# Patient Record
Sex: Female | Born: 1935 | Race: White | Hispanic: No | Marital: Single | Smoking: Never smoker
Health system: Southern US, Community
[De-identification: ages and names within clinical notes are randomized; demographics above are authoritative.]

## PROBLEM LIST (undated history)

## (undated) DIAGNOSIS — I10 Essential (primary) hypertension: Secondary | ICD-10-CM

## (undated) DIAGNOSIS — F419 Anxiety disorder, unspecified: Secondary | ICD-10-CM

## (undated) DIAGNOSIS — E785 Hyperlipidemia, unspecified: Secondary | ICD-10-CM

## (undated) DIAGNOSIS — N189 Chronic kidney disease, unspecified: Secondary | ICD-10-CM

## (undated) DIAGNOSIS — I499 Cardiac arrhythmia, unspecified: Secondary | ICD-10-CM

## (undated) DIAGNOSIS — H269 Unspecified cataract: Secondary | ICD-10-CM

## (undated) DIAGNOSIS — K2970 Gastritis, unspecified, without bleeding: Secondary | ICD-10-CM

## (undated) DIAGNOSIS — T7840XA Allergy, unspecified, initial encounter: Secondary | ICD-10-CM

## (undated) DIAGNOSIS — I471 Supraventricular tachycardia, unspecified: Secondary | ICD-10-CM

## (undated) DIAGNOSIS — M1990 Unspecified osteoarthritis, unspecified site: Secondary | ICD-10-CM

## (undated) DIAGNOSIS — K219 Gastro-esophageal reflux disease without esophagitis: Secondary | ICD-10-CM

## (undated) DIAGNOSIS — K635 Polyp of colon: Secondary | ICD-10-CM

## (undated) DIAGNOSIS — K449 Diaphragmatic hernia without obstruction or gangrene: Secondary | ICD-10-CM

## (undated) DIAGNOSIS — K589 Irritable bowel syndrome without diarrhea: Secondary | ICD-10-CM

## (undated) HISTORY — DX: Hyperlipidemia, unspecified: E78.5

## (undated) HISTORY — PX: EYE SURGERY: SHX253

## (undated) HISTORY — DX: Supraventricular tachycardia, unspecified: I47.10

## (undated) HISTORY — PX: GANGLION CYST EXCISION: SHX1691

## (undated) HISTORY — DX: Unspecified osteoarthritis, unspecified site: M19.90

## (undated) HISTORY — PX: KNEE ARTHROSCOPY: SHX127

## (undated) HISTORY — DX: Chronic kidney disease, unspecified: N18.9

## (undated) HISTORY — DX: Gastro-esophageal reflux disease without esophagitis: K21.9

## (undated) HISTORY — PX: CARPAL TUNNEL RELEASE: SHX101

## (undated) HISTORY — PX: SKIN CANCER EXCISION: SHX779

## (undated) HISTORY — DX: Allergy, unspecified, initial encounter: T78.40XA

## (undated) HISTORY — PX: TONSILLECTOMY AND ADENOIDECTOMY: SHX28

## (undated) HISTORY — DX: Anxiety disorder, unspecified: F41.9

## (undated) HISTORY — PX: CATARACT EXTRACTION W/ INTRAOCULAR LENS IMPLANT: SHX1309

## (undated) HISTORY — DX: Unspecified cataract: H26.9

## (undated) HISTORY — DX: Essential (primary) hypertension: I10

## (undated) HISTORY — DX: Polyp of colon: K63.5

## (undated) HISTORY — PX: TONSILLECTOMY: SUR1361

## (undated) HISTORY — PX: SALIVARY GLAND SURGERY: SHX768

## (undated) HISTORY — PX: VEIN LIGATION: SHX2652

## (undated) HISTORY — DX: Cardiac arrhythmia, unspecified: I49.9

## (undated) HISTORY — PX: LITHOTRIPSY: SUR834

---

## 1957-09-28 HISTORY — PX: APPENDECTOMY: SHX54

## 1993-09-28 HISTORY — PX: CHOLECYSTECTOMY: SHX55

## 2005-01-15 ENCOUNTER — Ambulatory Visit: Payer: Self-pay | Admitting: Gastroenterology

## 2005-06-21 ENCOUNTER — Emergency Department: Payer: Self-pay | Admitting: Emergency Medicine

## 2008-02-23 ENCOUNTER — Ambulatory Visit: Payer: Self-pay | Admitting: Unknown Physician Specialty

## 2008-03-19 ENCOUNTER — Ambulatory Visit: Payer: Self-pay | Admitting: Anesthesiology

## 2008-03-28 HISTORY — PX: JOINT REPLACEMENT: SHX530

## 2008-04-02 ENCOUNTER — Ambulatory Visit: Payer: Self-pay | Admitting: General Practice

## 2008-04-02 ENCOUNTER — Other Ambulatory Visit: Payer: Self-pay

## 2008-04-12 ENCOUNTER — Ambulatory Visit: Payer: Self-pay | Admitting: Anesthesiology

## 2008-04-17 ENCOUNTER — Inpatient Hospital Stay: Payer: Self-pay | Admitting: General Practice

## 2008-06-12 ENCOUNTER — Ambulatory Visit: Payer: Self-pay | Admitting: Anesthesiology

## 2008-09-17 ENCOUNTER — Ambulatory Visit: Payer: Self-pay | Admitting: Anesthesiology

## 2008-11-13 ENCOUNTER — Ambulatory Visit: Payer: Self-pay | Admitting: Anesthesiology

## 2009-02-13 ENCOUNTER — Ambulatory Visit: Payer: Self-pay | Admitting: Anesthesiology

## 2009-04-17 ENCOUNTER — Ambulatory Visit: Payer: Self-pay | Admitting: Anesthesiology

## 2009-05-28 ENCOUNTER — Emergency Department: Payer: Self-pay | Admitting: Emergency Medicine

## 2009-05-28 ENCOUNTER — Ambulatory Visit: Payer: Self-pay | Admitting: Anesthesiology

## 2009-06-12 ENCOUNTER — Emergency Department: Payer: Self-pay | Admitting: Emergency Medicine

## 2009-06-13 ENCOUNTER — Ambulatory Visit: Payer: Self-pay | Admitting: General Practice

## 2009-06-14 ENCOUNTER — Ambulatory Visit: Payer: Self-pay | Admitting: Urology

## 2009-06-21 ENCOUNTER — Ambulatory Visit: Payer: Self-pay | Admitting: Urology

## 2009-06-27 ENCOUNTER — Ambulatory Visit: Payer: Self-pay | Admitting: Urology

## 2009-07-12 ENCOUNTER — Ambulatory Visit: Payer: Self-pay | Admitting: Urology

## 2009-07-23 ENCOUNTER — Ambulatory Visit: Payer: Self-pay | Admitting: Anesthesiology

## 2009-07-26 ENCOUNTER — Ambulatory Visit: Payer: Self-pay | Admitting: General Practice

## 2009-08-09 ENCOUNTER — Ambulatory Visit: Payer: Self-pay | Admitting: Urology

## 2009-11-14 ENCOUNTER — Ambulatory Visit: Payer: Self-pay | Admitting: Anesthesiology

## 2009-12-02 ENCOUNTER — Ambulatory Visit: Payer: Self-pay | Admitting: Anesthesiology

## 2010-01-03 ENCOUNTER — Ambulatory Visit: Payer: Self-pay | Admitting: Urology

## 2010-02-18 ENCOUNTER — Ambulatory Visit: Payer: Self-pay | Admitting: Anesthesiology

## 2010-03-18 ENCOUNTER — Ambulatory Visit: Payer: Self-pay | Admitting: Anesthesiology

## 2010-06-04 DIAGNOSIS — C4491 Basal cell carcinoma of skin, unspecified: Secondary | ICD-10-CM

## 2010-06-04 HISTORY — DX: Basal cell carcinoma of skin, unspecified: C44.91

## 2010-12-24 ENCOUNTER — Ambulatory Visit: Payer: Self-pay | Admitting: Anesthesiology

## 2011-01-13 ENCOUNTER — Ambulatory Visit: Payer: Self-pay | Admitting: Urology

## 2011-01-22 ENCOUNTER — Ambulatory Visit: Payer: Self-pay | Admitting: Anesthesiology

## 2011-03-03 ENCOUNTER — Ambulatory Visit: Payer: Self-pay | Admitting: Anesthesiology

## 2011-03-31 ENCOUNTER — Ambulatory Visit: Payer: Self-pay | Admitting: Anesthesiology

## 2011-06-29 ENCOUNTER — Ambulatory Visit: Payer: Self-pay | Admitting: Anesthesiology

## 2011-07-08 ENCOUNTER — Ambulatory Visit: Payer: Self-pay | Admitting: Pain Medicine

## 2011-07-13 ENCOUNTER — Ambulatory Visit: Payer: Self-pay | Admitting: Pain Medicine

## 2011-07-20 ENCOUNTER — Ambulatory Visit: Payer: Self-pay | Admitting: Pain Medicine

## 2011-12-08 ENCOUNTER — Ambulatory Visit: Payer: Self-pay | Admitting: Urology

## 2012-10-05 ENCOUNTER — Ambulatory Visit: Payer: Self-pay | Admitting: Anesthesiology

## 2012-11-03 DIAGNOSIS — Z87442 Personal history of urinary calculi: Secondary | ICD-10-CM | POA: Insufficient documentation

## 2012-12-05 ENCOUNTER — Ambulatory Visit: Payer: Self-pay | Admitting: Anesthesiology

## 2013-01-06 ENCOUNTER — Ambulatory Visit: Payer: Self-pay | Admitting: Urology

## 2013-02-06 ENCOUNTER — Ambulatory Visit: Payer: Self-pay | Admitting: Anesthesiology

## 2013-03-07 ENCOUNTER — Ambulatory Visit: Payer: Self-pay | Admitting: Anesthesiology

## 2013-03-10 ENCOUNTER — Telehealth: Payer: Self-pay | Admitting: Internal Medicine

## 2013-03-10 NOTE — Telephone Encounter (Signed)
Appointment with dr Darrick Huntsman 8/5/1 pt aware  Canceled appointment with dr scott for 07/02/14

## 2013-03-10 NOTE — Telephone Encounter (Signed)
I will accept her as a patient,  But if she keeps the appt with dr Lorin Picket for October 5th she will need to stay with dr Lorin Picket

## 2013-03-10 NOTE — Telephone Encounter (Signed)
Pt came in to make new pt appointment with you.   She stated her friend Grant Ruts is  Patient of yours She wanted to know if you would see her as a new pt. Dr lamb at [place] is her dr now he is going to hospital  Made appointment with dr Lorin Picket for 07/02/14  Medicare bcbs

## 2013-05-02 ENCOUNTER — Encounter: Payer: Self-pay | Admitting: Internal Medicine

## 2013-05-02 ENCOUNTER — Ambulatory Visit (INDEPENDENT_AMBULATORY_CARE_PROVIDER_SITE_OTHER): Payer: Medicare Other | Admitting: Internal Medicine

## 2013-05-02 VITALS — BP 130/84 | HR 64 | Temp 98.7°F | Resp 14 | Ht 66.0 in | Wt 178.0 lb

## 2013-05-02 DIAGNOSIS — R195 Other fecal abnormalities: Secondary | ICD-10-CM | POA: Insufficient documentation

## 2013-05-02 DIAGNOSIS — R0982 Postnasal drip: Secondary | ICD-10-CM | POA: Insufficient documentation

## 2013-05-02 DIAGNOSIS — R635 Abnormal weight gain: Secondary | ICD-10-CM | POA: Insufficient documentation

## 2013-05-02 DIAGNOSIS — Z862 Personal history of diseases of the blood and blood-forming organs and certain disorders involving the immune mechanism: Secondary | ICD-10-CM

## 2013-05-02 DIAGNOSIS — E663 Overweight: Secondary | ICD-10-CM | POA: Insufficient documentation

## 2013-05-02 DIAGNOSIS — E559 Vitamin D deficiency, unspecified: Secondary | ICD-10-CM

## 2013-05-02 DIAGNOSIS — K625 Hemorrhage of anus and rectum: Secondary | ICD-10-CM | POA: Insufficient documentation

## 2013-05-02 DIAGNOSIS — E785 Hyperlipidemia, unspecified: Secondary | ICD-10-CM

## 2013-05-02 DIAGNOSIS — Z96651 Presence of right artificial knee joint: Secondary | ICD-10-CM

## 2013-05-02 DIAGNOSIS — K219 Gastro-esophageal reflux disease without esophagitis: Secondary | ICD-10-CM

## 2013-05-02 DIAGNOSIS — N3090 Cystitis, unspecified without hematuria: Secondary | ICD-10-CM

## 2013-05-02 DIAGNOSIS — Z96659 Presence of unspecified artificial knee joint: Secondary | ICD-10-CM

## 2013-05-02 DIAGNOSIS — Z79899 Other long term (current) drug therapy: Secondary | ICD-10-CM

## 2013-05-02 MED ORDER — NAPROXEN 500 MG PO TABS: 500.0000 mg | ORAL_TABLET | Freq: Two times a day (BID) | ORAL | Status: DC

## 2013-05-02 NOTE — Assessment & Plan Note (Signed)
Using an OTC nasal spray recommended by [facility].  Consider atrovent trial

## 2013-05-02 NOTE — Patient Instructions (Addendum)
Ask Dr Andee Poles about a trial of Atrovent nasal spray for your PND.  You can return for fasting labs (make an appt with the front desk)

## 2013-05-02 NOTE — Assessment & Plan Note (Signed)
I have addressed  BMI and recommended a low glycemic index diet utilizing smaller more frequent meals to increase metabolism.  I have also recommended that patient start exercising with a goal of 30 minutes of aerobic exercise a minimum of 5 days per week. Screening for lipid disorders, thyroid and diabetes to be done today.   

## 2013-05-02 NOTE — Assessment & Plan Note (Signed)
Excellent results.  continue pre procedure abx prophylaxis and encouraged to exercise.

## 2013-05-02 NOTE — Progress Notes (Signed)
Patient ID: Brenda Cardenas, female   DOB: 1932-01-30, 79 y.o.   MRN: [id number]  Patient Active Problem List   Diagnosis Date Noted  . GERD (gastroesophageal reflux disease) 05/02/2013  . Heme positive stool 05/02/2013  . S/P total knee replacement 05/02/2013  . Post-nasal drip 05/02/2013    Subjective:  CC:   Chief Complaint  Patient presents with  . Establish Care    HPI:   Chemeka Cardenas is a 79 y.o. female who presents as a new patient to establish primary care with the chief complaint of  Need for new PCP.  She was Referred by Lurlean Nanny. PCP Fidela Juneau.   History of recurrent UTIs culminating in a blocker ureter. 5 mm right side,  Required lithotripsy weeks of watchful waiting (stoioff) in Sept 2010  . Still has UTIs,  Last one in July,  [facility] treated by Dr Earlene Plater with cipro.  GERD.  On prilosec for 8 years for persistent clearing of throat .  Tried to abstain from prilosec during recent use of cipro and developed persistent disabling GERD.  EGD and colonoscopy for heme positive stool on recent annual FOBT which is  scheduled for next Monday .    Right knee replaced by [facility] 5 yrs ago.  Recent knee Checkup was normal.takes prophylactic abxs for procedures;  Podiatrist is Gwyneth Revels for flat foot /tendonitis left foot. Prior use of a boot by [employer] caused back pain to return,   Spinal stenosis,  Scoliosis and cervical spine DDD which causes radicular pain to left side of face.  Sees Dr Pernell Dupre in Pain Mgmt.  Epidural injections #5 in 2 weeks.   Lives at [facility].  Cares for her god daughter's young sons. Two days per week, in [place].      Post nasal drip previously on Afrin for chronic nasal congestion  Still having PND  Now managed by [facility]   Last pelvic and rectal exam 2 years ago  [name], GYN  At [facility] done in June   Worried about weight gain while she is unable to exercise.,  Uses a community pool and feels better when she is in the water, joints  don't ache.   Trouble with constipation,  Moves her bowels every day but does not take a  Fiber supplement daily.  uses   a colace daily.     Past Medical History  Diagnosis Date  . Arthritis   . GERD (gastroesophageal reflux disease)   . Hyperlipidemia   . Hypertension   . Chronic kidney disease   . Colon polyps     Past Surgical History  Procedure Laterality Date  . Appendectomy    . Cholecystectomy    . Tonsillectomy and adenoidectomy      Family History  Problem Relation Age of Onset  . Heart disease Mother   . Arthritis Mother   . Diabetes Father     History   Social History  . Marital Status: Single    Spouse Name: N/A    Number of Children: N/A  . Years of Education: N/A   Occupational History  . Not on file.   Social History Main Topics  . Smoking status: Never Smoker   . Smokeless tobacco: Never Used  . Alcohol Use: No  . Drug Use: No  . Sexually Active: No   Other Topics Concern  . Not on file   Social History Narrative  . No narrative on file   Allergies  Allergen  Reactions  . Augmentin (Amoxicillin-Pot Clavulanate) Diarrhea  . Codeine Nausea Only  . Doxycycline Nausea And Vomiting  . Lipitor (Atorvastatin) Other (See Comments)    Upsets liver enzymes  . Phenobarbital Other (See Comments)    Patient becomes very hyper and anxious  . Relafen (Nabumetone) Other (See Comments)    Upsets liver enzymes  . Tramadol Hcl Nausea Only  . E-Mycin (Erythromycin) Rash  . Macrobid (Nitrofurantoin) Rash    severe  . Sulfa Antibiotics Rash    Rash in throat    Review of Systems:   The remainder of the review of systems was negative except those addressed in the HPI.       Objective:  BP 130/84  Pulse 64  Temp(Src) 98.7 F (37.1 C) (Oral)  Resp 14  Ht 5\' 6"  (1.676 m)  Wt 178 lb (80.74 kg)  BMI 28.74 kg/m2  SpO2 99%  General appearance: alert, cooperative and appears stated age Ears: normal TM's and external ear canals both  ears Throat: lips, mucosa, and tongue normal; teeth and gums normal Neck: no adenopathy, no carotid bruit, supple, symmetrical, trachea midline and thyroid not enlarged, symmetric, no tenderness/mass/nodules Back: symmetric, no curvature. ROM normal. No CVA tenderness. Lungs: clear to auscultation bilaterally Heart: regular rate and rhythm, S1, S2 normal, no murmur, click, rub or gallop Abdomen: soft, non-tender; bowel sounds normal; no masses,  no organomegaly Pulses: 2+ and symmetric Skin: Skin color, texture, turgor normal. No rashes or lesions Lymph nodes: Cervical, supraclavicular, and axillary nodes normal.  Assessment and Plan:  Heme positive stool continue Prilosec, suspend all NSAIDs, ruling out iron deficiency anemia.  And follow up on EGD and colonoscopy  Post-nasal drip Using an OTC nasal spray recommended by [facility].  Consider atrovent trial  S/P total knee replacement Excellent results.  continue pre procedure abx prophylaxis and encouraged to exercise.   Weight gain, abnormal I have addressed  BMI and recommended a low glycemic index diet utilizing smaller more frequent meals to increase metabolism.  I have also recommended that patient start exercising with a goal of 30 minutes of aerobic exercise a minimum of 5 days per week. Screening for lipid disorders, thyroid and diabetes to be done today.    Recurrent cystitis Discussed atrophic vaginitis and treatment.   A total of 60 minutes was spent with patient more than half of which was spent in counseling, reviewing records from other prviders and coordination of care.   Updated Medication List Outpatient Encounter Prescriptions as of 05/02/2013  Medication Sig Dispense Refill  . atenolol (TENORMIN) 25 MG tablet Take 25 mg by mouth daily.      . calcium citrate-vitamin D (CITRACAL+D) 315-200 MG-UNIT per tablet Take 2 tablets by mouth daily.      Marland Kitchen ezetimibe (ZETIA) 10 MG tablet Take 10 mg by mouth daily.      . Misc  Natural Product Nasal (GELONASAL NA) Place 2 sprays into the nose 2 (two) times daily as needed.      . Multiple Vitamins-Minerals (MULTIVITAMIN WITH MINERALS) tablet Take 1 tablet by mouth daily.      . naproxen (NAPROSYN) 500 MG tablet Take 1 tablet (500 mg total) by mouth 2 (two) times daily with a meal.  180 tablet  3  . omeprazole (PRILOSEC) 20 MG capsule Take 20 mg by mouth daily.      . raloxifene (EVISTA) 60 MG tablet Take 60 mg by mouth daily.      . [DISCONTINUED] naproxen (NAPROSYN) 500  MG tablet Take 500 mg by mouth 2 (two) times daily with a meal.       No facility-administered encounter medications on file as of 05/02/2013.

## 2013-05-02 NOTE — Assessment & Plan Note (Signed)
Discussed atrophic vaginitis and treatment.

## 2013-05-02 NOTE — Assessment & Plan Note (Addendum)
continue Prilosec, suspend all NSAIDs, ruling out iron deficiency anemia.  And follow up on EGD and colonoscopy

## 2013-05-03 ENCOUNTER — Other Ambulatory Visit: Payer: Self-pay

## 2013-05-08 ENCOUNTER — Ambulatory Visit: Payer: Self-pay | Admitting: Unknown Physician Specialty

## 2013-05-08 LAB — HM COLONOSCOPY

## 2013-05-09 LAB — PATHOLOGY REPORT

## 2013-05-15 ENCOUNTER — Encounter: Payer: Self-pay | Admitting: Internal Medicine

## 2013-05-16 ENCOUNTER — Ambulatory Visit: Payer: Self-pay | Admitting: Anesthesiology

## 2013-05-30 ENCOUNTER — Encounter: Payer: Self-pay | Admitting: Internal Medicine

## 2013-06-07 ENCOUNTER — Encounter: Payer: Self-pay | Admitting: Internal Medicine

## 2013-06-12 ENCOUNTER — Other Ambulatory Visit (INDEPENDENT_AMBULATORY_CARE_PROVIDER_SITE_OTHER): Payer: Medicare Other

## 2013-06-12 DIAGNOSIS — E785 Hyperlipidemia, unspecified: Secondary | ICD-10-CM

## 2013-06-12 DIAGNOSIS — Z862 Personal history of diseases of the blood and blood-forming organs and certain disorders involving the immune mechanism: Secondary | ICD-10-CM

## 2013-06-12 DIAGNOSIS — Z79899 Other long term (current) drug therapy: Secondary | ICD-10-CM

## 2013-06-12 DIAGNOSIS — E559 Vitamin D deficiency, unspecified: Secondary | ICD-10-CM

## 2013-06-12 DIAGNOSIS — R195 Other fecal abnormalities: Secondary | ICD-10-CM

## 2013-06-12 LAB — IRON AND TIBC
%SAT: 32 % (ref 20–55)
TIBC: 335 ug/dL (ref 250–470)
UIBC: 229 ug/dL (ref 125–400)

## 2013-06-12 LAB — COMPREHENSIVE METABOLIC PANEL
ALT: 34 U/L (ref 0–35)
AST: 29 U/L (ref 0–37)
Alkaline Phosphatase: 65 U/L (ref 39–117)
Calcium: 9.1 mg/dL (ref 8.4–10.5)
Chloride: 106 mEq/L (ref 96–112)
Creatinine, Ser: 0.8 mg/dL (ref 0.4–1.2)
Potassium: 3.8 mEq/L (ref 3.5–5.1)

## 2013-06-12 LAB — LIPID PANEL
HDL: 51.1 mg/dL (ref >39.00)
Total CHOL/HDL Ratio: 4

## 2013-06-12 LAB — CBC WITH DIFFERENTIAL/PLATELET
Basophils Absolute: 0.1 10*3/uL (ref 0.0–0.1)
Basophils Relative: 0.6 % (ref 0.0–3.0)
Eosinophils Absolute: 0.2 10*3/uL (ref 0.0–0.7)
Hemoglobin: 13.4 g/dL (ref 12.0–15.0)
Lymphocytes Relative: 28.5 % (ref 12.0–46.0)
Lymphs Abs: 2.6 10*3/uL (ref 0.7–4.0)
MCHC: 33.9 g/dL (ref 30.0–36.0)
MCV: 95.4 fl (ref 78.0–100.0)
Monocytes Absolute: 0.6 10*3/uL (ref 0.1–1.0)
Neutro Abs: 5.7 10*3/uL (ref 1.4–7.7)
RDW: 16.3 % — ABNORMAL HIGH (ref 11.5–14.6)

## 2013-06-12 LAB — FERRITIN: Ferritin: 115.4 ng/mL (ref 10.0–291.0)

## 2013-06-13 ENCOUNTER — Encounter: Payer: Self-pay | Admitting: Internal Medicine

## 2013-06-13 LAB — VITAMIN D 25 HYDROXY (VIT D DEFICIENCY, FRACTURES): Vit D, 25-Hydroxy: 36 ng/mL (ref 30–89)

## 2013-06-15 ENCOUNTER — Telehealth: Payer: Self-pay | Admitting: Internal Medicine

## 2013-06-15 LAB — HM DEXA SCAN: HM Dexa Scan: DECREASED

## 2013-06-16 ENCOUNTER — Encounter: Payer: Self-pay | Admitting: Internal Medicine

## 2013-06-16 ENCOUNTER — Ambulatory Visit (INDEPENDENT_AMBULATORY_CARE_PROVIDER_SITE_OTHER): Payer: Medicare Other | Admitting: Internal Medicine

## 2013-06-16 VITALS — BP 144/78 | HR 79 | Temp 97.9°F | Resp 14 | Ht 66.0 in | Wt 171.5 lb

## 2013-06-16 DIAGNOSIS — K449 Diaphragmatic hernia without obstruction or gangrene: Secondary | ICD-10-CM | POA: Insufficient documentation

## 2013-06-16 DIAGNOSIS — R195 Other fecal abnormalities: Secondary | ICD-10-CM

## 2013-06-16 DIAGNOSIS — Z0000 Encounter for general adult medical examination without abnormal findings: Secondary | ICD-10-CM

## 2013-06-16 DIAGNOSIS — M810 Age-related osteoporosis without current pathological fracture: Secondary | ICD-10-CM

## 2013-06-16 DIAGNOSIS — K219 Gastro-esophageal reflux disease without esophagitis: Secondary | ICD-10-CM

## 2013-06-16 MED ORDER — CELECOXIB 200 MG PO CAPS: 200.0000 mg | ORAL_CAPSULE | Freq: Every day | ORAL | Status: DC

## 2013-06-16 NOTE — Progress Notes (Signed)
Patient ID: Brenda Cardenas, female   DOB: [birth year], 79 y.o.   MRN: [id number]  The patient is here for annual Medicare wellness examination andfollow up on chronic issues.  sHe has many questions and issues to follow up on.    1) Osteoporosis . Currently taking evista for osteoporosis.  Previously on fosomax for> 8 years years but it was stopped by Gyn .  For a shorter time she was on both Evista and Fosamax by Dr. Burnett Sheng. Fosamax has been stopped.  Had a fall recently and bruised up her right arm and hip but had no fractures.    2) Pneumonia and influenza vaccines have been done at [facility]  Second pneumonia vaccine has been postponed due to upcoming ailability of Prevnar  3) Gastritis and gastric polyps found on  EGD and Colonoscopy  done by Dr. Mechele Collin. He advised her to continue the omeprazole reluctantly since she was having reflux symptoms. Her symptoms occur at night so she's been instructed to take it at night. She had been taking naproxen chronically for cervical neck pain. She did not tolerate tramadol and the past trials due to nausea.  she does not have neck pain on a daily basis.     The risk factors are reflected in the social history.  The roster of all physicians providing medical care to patient - is listed in the Snapshot section of the chart.  Activities of daily living:  The patient is 100% independent in all ADLs: dressing, toileting, feeding as well as independent mobility  Home safety : The patient has smoke detectors in the home. They wear seatbelts.  There are no firearms at home. There is no violence in the home.   There is no risks for hepatitis, STDs or HIV. There is no   history of blood transfusion. They have no travel history to infectious disease endemic areas of the world.  The patient has seen their dentist in the last six month. They have seen their eye doctor in the last year. They admit to slight hearing difficulty with regard to whispered voices and some  television programs.  They have deferred audiologic testing in the last year.  They do not  have excessive sun exposure. Discussed the need for sun protection: hats, long sleeves and use of sunscreen if there is significant sun exposure.   Diet: the importance of a healthy diet is discussed. They do have a healthy diet.  The benefits of regular aerobic exercise were discussed. She walks 4 times per week ,  20 minutes.   Depression screen: there are no signs or vegative symptoms of depression- irritability, change in appetite, anhedonia, sadness/tearfullness.  Cognitive assessment: the patient manages all their financial and personal affairs and is actively engaged. They could relate day,date,year and events; recalled 2/3 objects at 3 minutes; performed clock-face test normally.  The following portions of the patient's history were reviewed and updated as appropriate: allergies, current medications, past family history, past medical history,  past surgical history, past social history  and problem list.  Visual acuity was not assessed per patient preference since she has regular follow up with her ophthalmologist. Hearing and body mass index were assessed and reviewed.   During the course of the visit the patient was educated and counseled about appropriate screening and preventive services including : fall prevention , diabetes screening, nutrition counseling, colorectal cancer screening, and recommended immunizations.    Objective:  BP 144/78  Pulse 79  Temp(Src) 97.9 F (36.6 C) (  Oral)  Resp 14  Ht 5\' 6"  (1.676 m)  Wt 171 lb 8 oz (77.792 kg)  BMI 27.69 kg/m2  SpO2 97% General appearance: alert, cooperative and appears stated age Ears: normal TM's and external ear canals both ears Throat: lips, mucosa, and tongue normal; teeth and gums normal Neck: no adenopathy, no carotid bruit, supple, symmetrical, trachea midline and thyroid not enlarged, symmetric, no  tenderness/mass/nodules Back: symmetric, no curvature. ROM normal. No CVA tenderness. Lungs: clear to auscultation bilaterally Heart: regular rate and rhythm, S1, S2 normal, no murmur, click, rub or gallop Abdomen: soft, non-tender; bowel sounds normal; no masses,  no organomegaly Pulses: 2+ and symmetric Skin: Skin color, texture, turgor normal. No rashes or lesions Lymph nodes: Cervical, supraclavicular, and axillary nodes normal.  Assessment and Plan:   GERD (gastroesophageal reflux disease) With gastric polyps noted on recent EGD. Continue omeprazole in the evening for continued symptoms. Avoid naproxen.  Heme positive stool Colonoscopy was normal. EGD showed gastritis. She will stop the naproxen continue omeprazole.  Hiatal hernia Minimal by recent EGD. Discussed ways to keep hiatal hernia asymptomatic which includes eating smaller more frequent meals and not lying down after eating. No surgery indicated.  Osteoporosis, unspecified Managed for years with alendronate, now on Evista, with last DEXA scan showing progression of disease. She's had no fractures. She did have a fall recently. We discussed a change to Prolia if her insurance will pay for it.  Routine general medical examination at a health care facility Annual comprehensive non GYN exam was done  All screenings have been addressed .    Updated Medication List Outpatient Encounter Prescriptions as of 06/16/2013  Medication Sig Dispense Refill  . atenolol (TENORMIN) 25 MG tablet Take 25 mg by mouth daily.      . calcium citrate-vitamin D (CITRACAL+D) 315-200 MG-UNIT per tablet Take 2 tablets by mouth daily.      Marland Kitchen ezetimibe (ZETIA) 10 MG tablet Take 10 mg by mouth daily.      . Misc Natural Product Nasal (GELONASAL NA) Place 2 sprays into the nose 2 (two) times daily as needed.      . Multiple Vitamins-Minerals (MULTIVITAMIN WITH MINERALS) tablet Take 1 tablet by mouth daily.      Marland Kitchen omeprazole (PRILOSEC) 20 MG capsule  Take 20 mg by mouth daily.      . raloxifene (EVISTA) 60 MG tablet Take 60 mg by mouth daily.      . celecoxib (CELEBREX) 200 MG capsule Take 1 capsule (200 mg total) by mouth daily.  30 capsule  5  . denosumab (PROLIA) 60 MG/ML SOLN injection Inject 60 mg into the skin every 6 (six) months. Administer in upper arm, thigh, or abdomen  1 mL  1  . naproxen (NAPROSYN) 500 MG tablet Take 1 tablet (500 mg total) by mouth 2 (two) times daily with a meal.  180 tablet  3   No facility-administered encounter medications on file as of 06/16/2013.

## 2013-06-16 NOTE — Patient Instructions (Addendum)
You have lost 7 lbs since your last visit !! To get your BMI < 25 goal is 154 lbs   Your iron studies were normal.  You are not anemic.  Your vitamin D is normal     We can try celebrex for your neck pain but I would prefer that we give you samples of it before you spend your $$$$ on it.  But no other NSAIDs should be used because of your gastritis    I would continue taking the omeprazole in the evening to allow your stomach to heal  And treat the reflux   We will submit request for Prolia coverage to your insurance ,  Continue the Evista  For now.   The maximum amount of tylenol you can take depends on whether you are using it daily  (2000 mg daily ) or sporadically ( 4000 mg daily for 3 or 4 days )   Continue to use ice packs and heat for the neck and back pain   I recommend that you get the Prevnar vaccine next year (this is a different kind of  Vaccine that protects you against pneumonia)

## 2013-06-18 ENCOUNTER — Encounter: Payer: Self-pay | Admitting: Internal Medicine

## 2013-06-18 DIAGNOSIS — M810 Age-related osteoporosis without current pathological fracture: Secondary | ICD-10-CM | POA: Insufficient documentation

## 2013-06-18 DIAGNOSIS — Z0000 Encounter for general adult medical examination without abnormal findings: Secondary | ICD-10-CM | POA: Insufficient documentation

## 2013-06-18 MED ORDER — DENOSUMAB 60 MG/ML ~~LOC~~ SOLN: 60.0000 mg | SUBCUTANEOUS | Status: DC

## 2013-06-18 NOTE — Assessment & Plan Note (Signed)
With gastric polyps noted on recent EGD. Continue omeprazole in the evening for continued symptoms. Avoid naproxen.

## 2013-06-18 NOTE — Assessment & Plan Note (Signed)
Minimal by recent EGD. Discussed ways to keep hiatal hernia asymptomatic which includes eating smaller more frequent meals and not lying down after eating. No surgery indicated.

## 2013-06-18 NOTE — Assessment & Plan Note (Signed)
Colonoscopy was normal. EGD showed gastritis. She will stop the naproxen continue omeprazole.

## 2013-06-18 NOTE — Assessment & Plan Note (Addendum)
Managed for years with alendronate, now on Evista, with last DEXA scan showing progression of disease. She's had no fractures. She did have a fall recently. We discussed a change to Prolia if her insurance will pay for it.

## 2013-06-18 NOTE — Assessment & Plan Note (Signed)
Annual comprehensive non GYN exam was done  All screenings have been addressed .

## 2013-06-26 DIAGNOSIS — C4492 Squamous cell carcinoma of skin, unspecified: Secondary | ICD-10-CM

## 2013-06-26 HISTORY — DX: Squamous cell carcinoma of skin, unspecified: C44.92

## 2013-06-27 ENCOUNTER — Encounter: Payer: Self-pay | Admitting: Internal Medicine

## 2013-06-29 ENCOUNTER — Encounter: Payer: Self-pay | Admitting: Internal Medicine

## 2013-07-03 ENCOUNTER — Encounter: Payer: Self-pay | Admitting: Internal Medicine

## 2013-07-04 ENCOUNTER — Telehealth: Payer: Self-pay | Admitting: Internal Medicine

## 2013-07-04 NOTE — Telephone Encounter (Signed)
Pt returned call.  Prolia injection scheduled for 08/16/2013.  Pt asking when she should stop taking Evista.  Please respond to pt with this answer via email.

## 2013-07-04 NOTE — Telephone Encounter (Signed)
Left msg asking pt to call regarding scheduling Prolia injection.  Insurance verification received.

## 2013-07-04 NOTE — Telephone Encounter (Signed)
Patient emailed

## 2013-07-18 ENCOUNTER — Ambulatory Visit: Payer: Self-pay | Admitting: Anesthesiology

## 2013-08-06 ENCOUNTER — Encounter: Payer: Self-pay | Admitting: Internal Medicine

## 2013-08-10 ENCOUNTER — Ambulatory Visit: Payer: Self-pay | Admitting: Anesthesiology

## 2013-08-10 ENCOUNTER — Encounter: Payer: Self-pay | Admitting: Internal Medicine

## 2013-08-16 ENCOUNTER — Ambulatory Visit (INDEPENDENT_AMBULATORY_CARE_PROVIDER_SITE_OTHER): Payer: Medicare Other | Admitting: *Deleted

## 2013-08-16 DIAGNOSIS — M810 Age-related osteoporosis without current pathological fracture: Secondary | ICD-10-CM

## 2013-08-16 MED ORDER — DENOSUMAB 60 MG/ML ~~LOC~~ SOLN
60.0000 mg | Freq: Once | SUBCUTANEOUS | Status: AC
  Administered 2013-08-16: 60 mg via SUBCUTANEOUS

## 2013-09-11 ENCOUNTER — Ambulatory Visit: Payer: Self-pay | Admitting: Anesthesiology

## 2013-10-06 ENCOUNTER — Ambulatory Visit (INDEPENDENT_AMBULATORY_CARE_PROVIDER_SITE_OTHER): Payer: Medicare Other | Admitting: Internal Medicine

## 2013-10-06 ENCOUNTER — Encounter: Payer: Self-pay | Admitting: Internal Medicine

## 2013-10-06 VITALS — BP 140/78 | HR 67 | Temp 97.5°F | Resp 14 | Wt 171.0 lb

## 2013-10-06 DIAGNOSIS — G5702 Lesion of sciatic nerve, left lower limb: Secondary | ICD-10-CM

## 2013-10-06 DIAGNOSIS — G5700 Lesion of sciatic nerve, unspecified lower limb: Secondary | ICD-10-CM

## 2013-10-06 MED ORDER — ATENOLOL 25 MG PO TABS: 25.0000 mg | ORAL_TABLET | Freq: Every day | ORAL | Status: DC

## 2013-10-06 MED ORDER — CYCLOBENZAPRINE HCL 5 MG PO TABS: 5.0000 mg | ORAL_TABLET | Freq: Three times a day (TID) | ORAL | Status: DC | PRN

## 2013-10-06 NOTE — Progress Notes (Signed)
Patient ID: Brenda Cardenas, female   DOB: [birth year], 80 y.o.   MRN: [id number]  Patient Active Problem List   Diagnosis Date Noted  . Piriformis syndrome of left side 10/08/2013  . Osteoporosis, unspecified 06/18/2013  . Routine general medical examination at a health care facility 06/18/2013  . Hiatal hernia 06/16/2013  . GERD (gastroesophageal reflux disease) 05/02/2013  . Heme positive stool 05/02/2013  . S/P total knee replacement 05/02/2013  . Post-nasal drip 05/02/2013  . Weight gain, abnormal 05/02/2013  . Recurrent cystitis 05/02/2013    Subjective:  CC:   Chief Complaint  Patient presents with  . Acute Visit    For issue's with meedication prescribed for siatic nerve pain.    HPI:   Brenda Cardenas a 80 y.o. female who presents  Past Medical History  Diagnosis Date  . Arthritis   . GERD (gastroesophageal reflux disease)   . Hyperlipidemia   . Hypertension   . Chronic kidney disease   . Colon polyps     Past Surgical History  Procedure Laterality Date  . Tonsillectomy and adenoidectomy    . Joint replacement Right July 2009    [facility]   . Appendectomy  1959  . Cholecystectomy  1995       The following portions of the patient's history were reviewed and updated as appropriate: Allergies, current medications, and problem list.    Review of Systems:   12 Pt  review of systems was negative except those addressed in the HPI,     History   Social History  . Marital Status: Single    Spouse Name: N/A    Number of Children: N/A  . Years of Education: N/A   Occupational History  . Not on file.   Social History Main Topics  . Smoking status: Never Smoker   . Smokeless tobacco: Never Used  . Alcohol Use: No  . Drug Use: No  . Sexual Activity: No   Other Topics Concern  . Not on file   Social History Narrative  . No narrative on file    Objective:  Filed Vitals:   10/06/13 1534  BP: 140/78  Pulse: 67  Temp: 97.5 F (36.4 C)   Resp: 14     General appearance: alert, cooperative and appears stated age Ears: normal TM's and external ear canals both ears Throat: lips, mucosa, and tongue normal; teeth and gums normal Neck: no adenopathy, no carotid bruit, supple, symmetrical, trachea midline and thyroid not enlarged, symmetric, no tenderness/mass/nodules Back: symmetric, no curvature. ROM normal. No CVA tenderness. Lungs: clear to auscultation bilaterally Heart: regular rate and rhythm, S1, S2 normal, no murmur, click, rub or gallop Abdomen: soft, non-tender; bowel sounds normal; no masses,  no organomegaly Pulses: 2+ and symmetric Skin: Skin color, texture, turgor normal. No rashes or lesions Lymph nodes: Cervical, supraclavicular, and axillary nodes normal.  Assessment and Plan:  Piriformis syndrome of left side Patient advised to continue PT, ce;ebrex and tylenol,  Modify daily activity (avoid hills).  Flexeril 5 mg added for prn pain at night  A total of 40 minutes was spent with patient more than half of which was spent in counseling, reviewing records from other providers and coordination of care.  Updated Medication List Outpatient Encounter Prescriptions as of 10/06/2013  Medication Sig  . amoxicillin (AMOXIL) 500 MG tablet Take 1,500 mg by mouth once. Prior to dental and surgical procedures  . atenolol (TENORMIN) 25 MG tablet Take 1 tablet (25 mg total) by mouth  daily.  . calcium citrate-vitamin D (CITRACAL+D) 315-200 MG-UNIT per tablet Take 2 tablets by mouth daily.  . celecoxib (CELEBREX) 200 MG capsule Take 1 capsule (200 mg total) by mouth daily.  Marland Kitchen denosumab (PROLIA) 60 MG/ML SOLN injection Inject 60 mg into the skin every 6 (six) months. Administer in upper arm, thigh, or abdomen  . ezetimibe (ZETIA) 10 MG tablet Take 10 mg by mouth daily.  . Misc Natural Product Nasal (GELONASAL NA) Place 2 sprays into the nose 2 (two) times daily as needed.  . Multiple Vitamins-Minerals (MULTIVITAMIN WITH  MINERALS) tablet Take 1 tablet by mouth daily.  Marland Kitchen omeprazole (PRILOSEC) 20 MG capsule Take 20 mg by mouth daily.  . [DISCONTINUED] atenolol (TENORMIN) 25 MG tablet Take 25 mg by mouth daily.  . cyclobenzaprine (FLEXERIL) 5 MG tablet Take 1 tablet (5 mg total) by mouth 3 (three) times daily as needed for muscle spasms.  . raloxifene (EVISTA) 60 MG tablet Take 60 mg by mouth daily.  . [DISCONTINUED] naproxen (NAPROSYN) 500 MG tablet Take 1 tablet (500 mg total) by mouth 2 (two) times daily with a meal.     No orders of the defined types were placed in this encounter.    No Follow-up on file.

## 2013-10-06 NOTE — Progress Notes (Signed)
Pre-visit discussion using our clinic review tool. No additional management support is needed unless otherwise documented below in the visit note.  

## 2013-10-06 NOTE — Patient Instructions (Addendum)
Continue celebrex once daily ,  And Add the tylenol up to 2000 mg daily   Will will try adding flexeril 5 mg ,  Muscle relaxer  Before your night time  Take 1 hour prior   If the flexeril does not help,  We will try Plan B: Stop the celebrex and take a prednisone taper (6 day dose pack)

## 2013-10-08 ENCOUNTER — Telehealth: Payer: Self-pay | Admitting: Internal Medicine

## 2013-10-08 ENCOUNTER — Encounter: Payer: Self-pay | Admitting: Internal Medicine

## 2013-10-08 DIAGNOSIS — G5702 Lesion of sciatic nerve, left lower limb: Secondary | ICD-10-CM | POA: Insufficient documentation

## 2013-10-08 MED ORDER — EZETIMIBE 10 MG PO TABS: 10.0000 mg | ORAL_TABLET | Freq: Every day | ORAL | Status: DC

## 2013-10-08 NOTE — Assessment & Plan Note (Signed)
Patient advised to continue PT, ce;ebrex and tylenol,  Modify daily activity (avoid hills).  Flexeril 5 mg added for prn pain at night

## 2013-11-11 ENCOUNTER — Encounter: Payer: Self-pay | Admitting: Internal Medicine

## 2013-11-13 MED ORDER — LIDOCAINE 5 % EX PTCH: 1.0000 | MEDICATED_PATCH | CUTANEOUS | Status: DC

## 2013-11-22 ENCOUNTER — Telehealth: Payer: Self-pay | Admitting: Internal Medicine

## 2013-11-22 ENCOUNTER — Ambulatory Visit (INDEPENDENT_AMBULATORY_CARE_PROVIDER_SITE_OTHER): Payer: Medicare Other | Admitting: Podiatry

## 2013-11-22 ENCOUNTER — Ambulatory Visit (INDEPENDENT_AMBULATORY_CARE_PROVIDER_SITE_OTHER): Payer: Medicare Other

## 2013-11-22 ENCOUNTER — Encounter: Payer: Self-pay | Admitting: Podiatry

## 2013-11-22 VITALS — BP 151/92 | HR 85 | Resp 16 | Ht 66.0 in | Wt 167.0 lb

## 2013-11-22 DIAGNOSIS — M79609 Pain in unspecified limb: Secondary | ICD-10-CM

## 2013-11-22 DIAGNOSIS — Z79899 Other long term (current) drug therapy: Secondary | ICD-10-CM

## 2013-11-22 DIAGNOSIS — Q6650 Congenital pes planus, unspecified foot: Secondary | ICD-10-CM

## 2013-11-22 DIAGNOSIS — M76829 Posterior tibial tendinitis, unspecified leg: Secondary | ICD-10-CM

## 2013-11-22 DIAGNOSIS — E785 Hyperlipidemia, unspecified: Secondary | ICD-10-CM

## 2013-11-22 DIAGNOSIS — M79673 Pain in unspecified foot: Secondary | ICD-10-CM

## 2013-11-22 DIAGNOSIS — M76822 Posterior tibial tendinitis, left leg: Secondary | ICD-10-CM

## 2013-11-22 DIAGNOSIS — M722 Plantar fascial fibromatosis: Secondary | ICD-10-CM

## 2013-11-22 NOTE — Telephone Encounter (Signed)
Please schedule lab appointment.

## 2013-11-22 NOTE — Telephone Encounter (Signed)
Does the patient need repeat labs done before her appointment on 3.20.15. If so they patient is needing a call back to schedule.

## 2013-11-22 NOTE — Telephone Encounter (Signed)
Yes, fasting labs

## 2013-11-22 NOTE — Telephone Encounter (Signed)
Please advise 

## 2013-11-22 NOTE — Progress Notes (Signed)
   Subjective:    Patient ID: Brenda Cardenas, female    DOB: Dec 26, 1931, 80 y.o.   MRN: [id number]  HPI Comments: i have pain all over my foot on the left. Its been going on since July 2013. Every time i stepped down the pain would get really bad. i went to see dr troxler and he put me in a boot that threw my back out. Then i went to see dr Vickki Muff and he took the boot, and told me to wear the ankle stabalizer.  i had an injection on 2.25.14 by dr Vickki Muff. i went to see dr Vickki Muff 3.18.14 and he didn't do anything. On December 27 2012 we ordered orthotics and they dont help. i had an injection 4.22.14 and a x-ray. On 6.25.14 i had another injection. On 8.22.14 i had an injection and another injection 11.11.14.   Foot Pain      Review of Systems  Constitutional: Negative.   HENT:       Sinus problems Ringing in ears  Eyes: Negative.   Respiratory: Negative.   Cardiovascular:       Calf pain  Gastrointestinal: Negative.   Endocrine: Negative.   Genitourinary: Negative.   Musculoskeletal:       Joint pain Back pain Muscle pain   Skin: Negative.   Allergic/Immunologic: Negative.   Neurological: Negative.   Hematological: Bruises/bleeds easily.  Psychiatric/Behavioral: Negative.        Objective:   Physical Exam: Past medical history medications allergies surgeries and social history and review of systems. Vital signs are stable she is alert and oriented x3. Pulses are strongly palpable bilateral capillary fill time to digits one through 5 bilateral is immediate deep tendon reflexes are elicitable bilateral muscle strength is +4/5 dorsiflexors plantar flexors inverters and evertors with the exception of the posterior tibial tendon of her left foot and ankle which appears to be very weak. Orthopedic evaluation demonstrates all joints distal to the ankle a full range of motion without crepitus. Her right foot is rectus her left foot is severely pronated and abducted. Radiographic evaluation  confirms this. She is unable to invert her foot solely with the use of the posterior tibial tendon.        Assessment & Plan:  Assessment: Pes planus with posterior tibial tendon dysfunction left foot.  Plan: Discussed the etiology pathology conservative versus surgical therapies. At this point I think an [place] will be sufficient. Surgery to correct this point likely not doing very well for her.

## 2013-11-24 ENCOUNTER — Ambulatory Visit (INDEPENDENT_AMBULATORY_CARE_PROVIDER_SITE_OTHER): Payer: Medicare Other | Admitting: Podiatry

## 2013-11-24 DIAGNOSIS — M216X9 Other acquired deformities of unspecified foot: Secondary | ICD-10-CM

## 2013-11-24 DIAGNOSIS — M76829 Posterior tibial tendinitis, unspecified leg: Secondary | ICD-10-CM

## 2013-11-24 DIAGNOSIS — M6789 Other specified disorders of synovium and tendon, multiple sites: Secondary | ICD-10-CM

## 2013-11-24 DIAGNOSIS — M21372 Foot drop, left foot: Secondary | ICD-10-CM

## 2013-11-25 ENCOUNTER — Encounter: Payer: Self-pay | Admitting: Internal Medicine

## 2013-11-25 ENCOUNTER — Encounter: Payer: Self-pay | Admitting: Podiatry

## 2013-11-27 ENCOUNTER — Ambulatory Visit: Payer: Self-pay | Admitting: Anesthesiology

## 2013-11-27 NOTE — Telephone Encounter (Signed)
Left a message on cell phone to call back to schedule lab appointment.

## 2013-11-27 NOTE — Progress Notes (Signed)
Seen by assistant today for [facility] brace cast/molding.  She will follow up with Dr. Milinda Pointer once the brace comes in.

## 2013-11-30 ENCOUNTER — Telehealth: Payer: Self-pay | Admitting: Internal Medicine

## 2013-11-30 NOTE — Telephone Encounter (Signed)
Opened in error needed to send my chart message/rbh

## 2013-12-04 ENCOUNTER — Other Ambulatory Visit: Payer: Self-pay | Admitting: *Deleted

## 2013-12-04 MED ORDER — OMEPRAZOLE 20 MG PO CPDR
20.0000 mg | DELAYED_RELEASE_CAPSULE | Freq: Every day | ORAL | Status: DC
Start: 2013-12-04 — End: 2014-06-18

## 2013-12-06 ENCOUNTER — Other Ambulatory Visit (INDEPENDENT_AMBULATORY_CARE_PROVIDER_SITE_OTHER): Payer: Medicare Other

## 2013-12-06 DIAGNOSIS — E785 Hyperlipidemia, unspecified: Secondary | ICD-10-CM

## 2013-12-06 DIAGNOSIS — Z79899 Other long term (current) drug therapy: Secondary | ICD-10-CM

## 2013-12-06 LAB — COMPREHENSIVE METABOLIC PANEL
ALT: 21 U/L (ref 0–35)
AST: 19 U/L (ref 0–37)
Albumin: 3.7 g/dL (ref 3.5–5.2)
Alkaline Phosphatase: 66 U/L (ref 39–117)
BUN: 26 mg/dL — AB (ref 6–23)
CALCIUM: 8.5 mg/dL (ref 8.4–10.5)
CHLORIDE: 110 meq/L (ref 96–112)
CO2: 24 mEq/L (ref 19–32)
CREATININE: 0.8 mg/dL (ref 0.4–1.2)
GFR: 74.43 mL/min (ref >60.00)
GLUCOSE: 100 mg/dL — AB (ref 70–99)
POTASSIUM: 3.9 meq/L (ref 3.5–5.1)
Sodium: 138 mEq/L (ref 135–145)
Total Bilirubin: 0.9 mg/dL (ref 0.3–1.2)
Total Protein: 6.3 g/dL (ref 6.0–8.3)

## 2013-12-06 LAB — LIPID PANEL
Cholesterol: 190 mg/dL (ref 0–200)
HDL: 62.5 mg/dL (ref >39.00)
LDL CALC: 115 mg/dL — AB (ref 0–99)
Total CHOL/HDL Ratio: 3
Triglycerides: 63 mg/dL (ref 0.0–149.0)
VLDL: 12.6 mg/dL (ref 0.0–40.0)

## 2013-12-08 ENCOUNTER — Encounter: Payer: Self-pay | Admitting: Internal Medicine

## 2013-12-15 ENCOUNTER — Ambulatory Visit: Payer: Medicare Other | Admitting: Internal Medicine

## 2013-12-25 ENCOUNTER — Telehealth: Payer: Self-pay | Admitting: *Deleted

## 2013-12-25 ENCOUNTER — Ambulatory Visit (INDEPENDENT_AMBULATORY_CARE_PROVIDER_SITE_OTHER): Payer: Medicare Other | Admitting: Internal Medicine

## 2013-12-25 ENCOUNTER — Telehealth: Payer: Self-pay | Admitting: Internal Medicine

## 2013-12-25 ENCOUNTER — Encounter: Payer: Self-pay | Admitting: Internal Medicine

## 2013-12-25 ENCOUNTER — Ambulatory Visit: Payer: Medicare Other | Admitting: Internal Medicine

## 2013-12-25 VITALS — BP 138/78 | HR 70 | Temp 97.5°F | Resp 18 | Wt 172.5 lb

## 2013-12-25 DIAGNOSIS — R195 Other fecal abnormalities: Secondary | ICD-10-CM

## 2013-12-25 DIAGNOSIS — M76829 Posterior tibial tendinitis, unspecified leg: Secondary | ICD-10-CM

## 2013-12-25 DIAGNOSIS — K2970 Gastritis, unspecified, without bleeding: Secondary | ICD-10-CM

## 2013-12-25 DIAGNOSIS — K219 Gastro-esophageal reflux disease without esophagitis: Secondary | ICD-10-CM

## 2013-12-25 DIAGNOSIS — M810 Age-related osteoporosis without current pathological fracture: Secondary | ICD-10-CM

## 2013-12-25 DIAGNOSIS — M549 Dorsalgia, unspecified: Secondary | ICD-10-CM

## 2013-12-25 DIAGNOSIS — K2990 Gastroduodenitis, unspecified, without bleeding: Secondary | ICD-10-CM

## 2013-12-25 MED ORDER — CELECOXIB 200 MG PO CAPS: 200.0000 mg | ORAL_CAPSULE | Freq: Every day | ORAL | Status: DC

## 2013-12-25 MED ORDER — OMEPRAZOLE 20 MG PO CPDR: 20.0000 mg | DELAYED_RELEASE_CAPSULE | Freq: Every day | ORAL | Status: DC

## 2013-12-25 NOTE — Progress Notes (Signed)
Patient ID: Brenda Cardenas, female   DOB: January 09, 1932, 80 y.o.   MRN: [id number]  Patient Active Problem List   Diagnosis Date Noted  . Tibialis posterior tendonitis 12/26/2013  . Osteoporosis, unspecified 06/18/2013  . Routine general medical examination at a health care facility 06/18/2013  . Hiatal hernia 06/16/2013  . GERD (gastroesophageal reflux disease) 05/02/2013  . Heme positive stool 05/02/2013  . S/P total knee replacement 05/02/2013  . Post-nasal drip 05/02/2013  . Weight gain, abnormal 05/02/2013  . Recurrent cystitis 05/02/2013    Subjective:  CC:   Chief Complaint  Patient presents with  . Follow-up    6 month follow up    HPI:   Brenda Cardenas is a 80 y.o. female who presents for Follow up on multiple chronic issues.    1) chronic back pain due to spinal stenosis and sicatica . She has had back pain since age 31, with no history of trauma or surgeries.  She rceived an epidural and a trigger shot  march 2 ,  Both pain complaints wrre resolved for about  3 weeks,  But lateral right sided the pain in the back has returned since she fell off a coffee table she was sitting on during recent upheaval of home due to move.  State that she has significnt pain getting in and out of bed.  Has been taking celebrex daily which has been helping . Tried flexeril,  No help,  Had a lidocaine patch and tried it but became very nauseated. Wants to know if she should continue daily celebrex,  Has days where she has morning stiffness, no back pain after 30 minutes, and days when back hurts all day.   2) Histor yof gastritis and GERD :  She is taking prilosec but has questions about continuinig daily use since she was told she had  gastric polyps and wonders if the prolosec daily dosing is contributing.  Uses tums prn  for acid reflux,   3)  Foot pain Is seeing a new podiatrist  For her foot pain ,  Now with Dr Milinda Pointer, who was very thorough , took x rays , and did not recommend surgery for  posterior tibialis tendonitis .  recived a retrocalcaneal injection and an ankle brace which  She is not wearing today.   4) Osteoporosis:  Now managed with Prolia,  Next injection is due on or around May 18th,  Wants to continue regular surveillance wih DEXA scans.    Past Medical History  Diagnosis Date  . Arthritis   . GERD (gastroesophageal reflux disease)   . Hyperlipidemia   . Hypertension   . Chronic kidney disease   . Colon polyps     Past Surgical History  Procedure Laterality Date  . Tonsillectomy and adenoidectomy    . Joint replacement Right July 2009    [facility]   . Appendectomy  1959  . Cholecystectomy  1995       The following portions of the patient's history were reviewed and updated as appropriate: Allergies, current medications, and problem list.    Review of Systems:   Patient denies headache, fevers, malaise, unintentional weight loss, skin rash, eye pain, sinus congestion and sinus pain, sore throat, dysphagia,  hemoptysis , cough, dyspnea, wheezing, chest pain, palpitations, orthopnea, edema, abdominal pain, nausea, melena, diarrhea, constipation, flank pain, dysuria, hematuria, urinary  Frequency, nocturia, numbness, tingling, seizures,  Focal weakness, Loss of consciousness,  Tremor, insomnia, depression, anxiety, and suicidal ideation.  History   Social History  . Marital Status: Single    Spouse Name: N/A    Number of Children: N/A  . Years of Education: N/A   Occupational History  . Not on file.   Social History Main Topics  . Smoking status: Never Smoker   . Smokeless tobacco: Never Used  . Alcohol Use: No  . Drug Use: No  . Sexual Activity: No   Other Topics Concern  . Not on file   Social History Narrative  . No narrative on file    Objective:  Filed Vitals:   12/25/13 1514  BP: 138/78  Pulse: 70  Temp: 97.5 F (36.4 C)  Resp: 18     General appearance: alert, cooperative and appears stated age Ears: normal TM's  and external ear canals both ears Throat: lips, mucosa, and tongue normal; teeth and gums normal Neck: no adenopathy, no carotid bruit, supple, symmetrical, trachea midline and thyroid not enlarged, symmetric, no tenderness/mass/nodules Back: symmetric, no curvature. ROM normal. No CVA tenderness. Lungs: clear to auscultation bilaterally Heart: regular rate and rhythm, S1, S2 normal, no murmur, click, rub or gallop Abdomen: soft, non-tender; bowel sounds normal; no masses,  no organomegaly Pulses: 2+ and symmetric Skin: Skin color, texture, turgor normal. No rashes or lesions Lymph nodes: Cervical, supraclavicular, and axillary nodes normal.  Assessment and Plan:  GERD (gastroesophageal reflux disease) Managed with daily prilosec and prn tums.   Osteoporosis, unspecified She is due for next Prolia injection mid may for progression of disease by DEXA scan Sept 2014 at [facility]  Heme positive stool Diagnostic Colonoscopy was normal. EGD showed gastritis. She will continue omeprazole and take the celebrex prn persistent pain lasting longer than 1 hour    Tibialis posterior tendonitis Left foot, diagnosed by Dr Milinda Pointer,  Improved somewhat with use of ankle  Brace and steroid injection ,  No surgery advised.   A total of 40 minutes was spent with patient more than half of which was spent in counseling, reviewing records from other prviders and coordination of care.  Updated Medication List Outpatient Encounter Prescriptions as of 12/25/2013  Medication Sig  . atenolol (TENORMIN) 25 MG tablet Take 1 tablet (25 mg total) by mouth daily.  . calcium citrate-vitamin D (CITRACAL+D) 315-200 MG-UNIT per tablet Take 2 tablets by mouth daily.  . celecoxib (CELEBREX) 200 MG capsule Take 1 capsule (200 mg total) by mouth daily.  Marland Kitchen ezetimibe (ZETIA) 10 MG tablet Take 1 tablet (10 mg total) by mouth daily.  . Misc Natural Product Nasal (GELONASAL NA) Place 2 sprays into the nose 2 (two) times  daily as needed.  . Multiple Vitamins-Minerals (MULTIVITAMIN WITH MINERALS) tablet Take 1 tablet by mouth daily.  Marland Kitchen omeprazole (PRILOSEC) 20 MG capsule Take 1 capsule (20 mg total) by mouth daily.  . [DISCONTINUED] celecoxib (CELEBREX) 200 MG capsule Take 1 capsule (200 mg total) by mouth daily.  Marland Kitchen omeprazole (PRILOSEC) 20 MG capsule Take 1 capsule (20 mg total) by mouth daily.     No orders of the defined types were placed in this encounter.    No Follow-up on file.

## 2013-12-25 NOTE — Progress Notes (Signed)
Pre-visit discussion using our clinic review tool. No additional management support is needed unless otherwise documented below in the visit note.  

## 2013-12-25 NOTE — Telephone Encounter (Signed)
Pt stated she is due for prolia after Feb 12, 2014

## 2013-12-25 NOTE — Telephone Encounter (Signed)
Left voicemail letting pt know AFO brace is in office and she will need to make appt with dr Milinda Pointer to pick it up.

## 2013-12-25 NOTE — Patient Instructions (Addendum)
For your back pain:    Take 2  tylenol (650 mg total)   In the AM   Take a celebrex at lunchtime if back pain is still present  Take 2 more tylenol in the late afternoon if needed  Next prolia is due after May 18; call us if yo do not hear from [place] by may 5

## 2013-12-26 ENCOUNTER — Encounter: Payer: Self-pay | Admitting: Internal Medicine

## 2013-12-26 DIAGNOSIS — M76829 Posterior tibial tendinitis, unspecified leg: Secondary | ICD-10-CM | POA: Insufficient documentation

## 2013-12-26 NOTE — Assessment & Plan Note (Addendum)
She is due for next Prolia injection mid may for progression of disease by DEXA scan Sept 2014 at [facility]

## 2013-12-26 NOTE — Assessment & Plan Note (Signed)
Managed with daily prilosec and prn tums.

## 2013-12-26 NOTE — Assessment & Plan Note (Signed)
Left foot, diagnosed by Dr Milinda Pointer,  Improved somewhat with use of ankle  Brace and steroid injection ,  No surgery advised.

## 2013-12-26 NOTE — Assessment & Plan Note (Signed)
Diagnostic Colonoscopy was normal. EGD showed gastritis. She will continue omeprazole and take the celebrex prn persistent pain lasting longer than 1 hour

## 2014-01-08 ENCOUNTER — Ambulatory Visit (INDEPENDENT_AMBULATORY_CARE_PROVIDER_SITE_OTHER): Payer: Medicare Other | Admitting: Podiatry

## 2014-01-08 VITALS — Resp 16 | Ht 66.0 in | Wt 166.0 lb

## 2014-01-08 DIAGNOSIS — M6789 Other specified disorders of synovium and tendon, multiple sites: Secondary | ICD-10-CM

## 2014-01-08 DIAGNOSIS — M76829 Posterior tibial tendinitis, unspecified leg: Secondary | ICD-10-CM

## 2014-01-08 NOTE — Progress Notes (Signed)
She presents today to pick up her [place] brace for her posterior tibial tendon dysfunction. She states that she does not like this brace because it is tight on her leg and it will be hard to fit with shoe gear. She is also concerned about whether or not it will cause her back pain to worsen and she is questioning whether I could give her anything for pain.  Objective: Vital signs are stable she is alert and oriented x3. The [place] brace was dispensed and it fit perfectly. She will have to find a pair shoes to accommodate the brace.  Assessment posterior tibial tendon dysfunction  Plan: Followup with her in approximately 6 weeks after she has had time to break in the brace.

## 2014-01-29 ENCOUNTER — Ambulatory Visit: Payer: Self-pay | Admitting: Urology

## 2014-01-30 ENCOUNTER — Telehealth: Payer: Self-pay | Admitting: Internal Medicine

## 2014-01-30 NOTE — Telephone Encounter (Signed)
Prolia approval needed.  Per pt, injection should be scheduled after 5/18; last received 07/2013.

## 2014-02-01 NOTE — Telephone Encounter (Signed)
Sent info to [employer] for insurance verification; will notify you once I have a response from them. Thank you.

## 2014-02-05 ENCOUNTER — Telehealth: Payer: Self-pay | Admitting: Internal Medicine

## 2014-02-05 DIAGNOSIS — M549 Dorsalgia, unspecified: Secondary | ICD-10-CM

## 2014-02-05 DIAGNOSIS — K2970 Gastritis, unspecified, without bleeding: Secondary | ICD-10-CM

## 2014-02-05 DIAGNOSIS — K2990 Gastroduodenitis, unspecified, without bleeding: Principal | ICD-10-CM

## 2014-02-05 NOTE — Telephone Encounter (Signed)
Can I refill for a year?

## 2014-02-05 NOTE — Telephone Encounter (Signed)
Dropped off paper regarding refills.  States she was only given refill for 3 months, she normally gets full year.  Pt wrote details on paper.  Placed in Dr. Lupita Dawn box.

## 2014-02-06 NOTE — Telephone Encounter (Signed)
Yes ok to refill for one year

## 2014-02-08 NOTE — Telephone Encounter (Signed)
Pt's secondary insurance, BCBS is requiring a prior authorization for the Prolia injection.  [facility] is sending me a form that Dr. Derrel Nip will need to sign.  Once that is signed you can return the form along w/clinicals to me so we can get the p/a started.  I will notify you as soon as we have a response from them. If you have any questions, please let me know. Thank you!

## 2014-02-12 ENCOUNTER — Encounter: Payer: Self-pay | Admitting: Podiatry

## 2014-02-12 ENCOUNTER — Ambulatory Visit (INDEPENDENT_AMBULATORY_CARE_PROVIDER_SITE_OTHER): Payer: Medicare Other | Admitting: Podiatry

## 2014-02-12 VITALS — BP 141/74 | HR 67 | Resp 18

## 2014-02-12 DIAGNOSIS — M76822 Posterior tibial tendinitis, left leg: Secondary | ICD-10-CM

## 2014-02-12 DIAGNOSIS — M76829 Posterior tibial tendinitis, unspecified leg: Secondary | ICD-10-CM

## 2014-02-12 DIAGNOSIS — M6789 Other specified disorders of synovium and tendon, multiple sites: Secondary | ICD-10-CM

## 2014-02-12 MED ORDER — CELECOXIB 200 MG PO CAPS: 200.0000 mg | ORAL_CAPSULE | Freq: Every day | ORAL | Status: DC

## 2014-02-12 MED ORDER — EZETIMIBE 10 MG PO TABS: 10.0000 mg | ORAL_TABLET | Freq: Every day | ORAL | Status: DC

## 2014-02-12 MED ORDER — ATENOLOL 25 MG PO TABS: 25.0000 mg | ORAL_TABLET | Freq: Every day | ORAL | Status: DC

## 2014-02-12 MED ORDER — OMEPRAZOLE 20 MG PO CPDR: 20.0000 mg | DELAYED_RELEASE_CAPSULE | Freq: Every day | ORAL | Status: DC

## 2014-02-12 NOTE — Progress Notes (Signed)
She presents today for followup of her [place]. She states it is starting to get use to it and I did have to purchase a large pair shoes. She states that I don't think my foot hurt nearly as much as it did.  Objective: Vital signs are stable she is alert and oriented x3. She has [place] brace fracture to her left foot which appears to be holding the left foot and ankle much more rectus been without the brace. The brace appears to be wearing quite nicely. She is wearing it with a pair of [facility].  Assessment: Posterior tibial tendon dysfunction with left foot left.  Plan: Continue the use of the [place] brace and followup with me as needed.

## 2014-02-12 NOTE — Telephone Encounter (Signed)
Refills sent

## 2014-02-21 ENCOUNTER — Encounter: Payer: Self-pay | Admitting: Internal Medicine

## 2014-02-23 ENCOUNTER — Encounter: Payer: Self-pay | Admitting: Internal Medicine

## 2014-02-23 ENCOUNTER — Ambulatory Visit (INDEPENDENT_AMBULATORY_CARE_PROVIDER_SITE_OTHER): Payer: Medicare Other | Admitting: Internal Medicine

## 2014-02-23 VITALS — BP 134/84 | HR 69 | Temp 98.3°F | Resp 16 | Ht 66.0 in | Wt 168.0 lb

## 2014-02-23 DIAGNOSIS — M549 Dorsalgia, unspecified: Secondary | ICD-10-CM

## 2014-02-23 DIAGNOSIS — K2990 Gastroduodenitis, unspecified, without bleeding: Secondary | ICD-10-CM

## 2014-02-23 DIAGNOSIS — K2970 Gastritis, unspecified, without bleeding: Secondary | ICD-10-CM

## 2014-02-23 DIAGNOSIS — M47812 Spondylosis without myelopathy or radiculopathy, cervical region: Secondary | ICD-10-CM

## 2014-02-23 MED ORDER — OMEPRAZOLE 20 MG PO CPDR
20.0000 mg | DELAYED_RELEASE_CAPSULE | Freq: Every day | ORAL | Status: DC
Start: 2014-02-23 — End: 2014-06-18

## 2014-02-23 MED ORDER — CELECOXIB 200 MG PO CAPS: 200.0000 mg | ORAL_CAPSULE | Freq: Every day | ORAL | Status: DC

## 2014-02-23 MED ORDER — ONDANSETRON 4 MG PO TBDP: 4.0000 mg | ORAL_TABLET | Freq: Three times a day (TID) | ORAL | Status: DC | PRN

## 2014-02-23 NOTE — Progress Notes (Signed)
Pre-visit discussion using our clinic review tool. No additional management support is needed unless otherwise documented below in the visit note.  

## 2014-02-23 NOTE — Progress Notes (Signed)
Patient ID: Brenda Cardenas, female   DOB: April 14, 1932, 80 y.o.   MRN: [id number]   Patient Active Problem List   Diagnosis Date Noted  . Osteoarthritis of cervical spine 02/25/2014  . Tibialis posterior tendonitis 12/26/2013  . Osteoporosis, unspecified 06/18/2013  . Routine general medical examination at a health care facility 06/18/2013  . Hiatal hernia 06/16/2013  . GERD (gastroesophageal reflux disease) 05/02/2013  . Heme positive stool 05/02/2013  . S/P total knee replacement 05/02/2013  . Post-nasal drip 05/02/2013  . Weight gain, abnormal 05/02/2013  . Recurrent cystitis 05/02/2013    Subjective:  CC:   Chief Complaint  Patient presents with  . Neck Pain    talk about medication for neck pain.    HPI:   Brenda Cardenas is a 80 y.o. female who presents for Left sided neck pain ,  Started in 2011,  Naproxen caused gastritis.,  Taking 2 tylenol ,  Sometimes pain radiates to let side of head and left eye.    History of cervical spine injection made it worse.    Then tried  Facet injections which did nothing.  Deferred radiofrequency ablation offered by Cleda Mccreedy.   Still sees pain management  For back pain., but no appt until June 8th   All  Non narcotics have  Been tried and not tolerated due to various side effects,  NSAIDs have been avoided per patient due to chronic gastritis seen on EGD (asymptomatic) but she is tolerating celebrex.   Past Medical History  Diagnosis Date  . Arthritis   . GERD (gastroesophageal reflux disease)   . Hyperlipidemia   . Hypertension   . Chronic kidney disease   . Colon polyps     Past Surgical History  Procedure Laterality Date  . Tonsillectomy and adenoidectomy    . Joint replacement Right July 2009    [facility]   . Appendectomy  1959  . Cholecystectomy  1995       The following portions of the patient's history were reviewed and updated as appropriate: Allergies, current medications, and problem list.    Review of  Systems:   Patient denies headache, fevers, malaise, unintentional weight loss, skin rash, eye pain, sinus congestion and sinus pain, sore throat, dysphagia,  hemoptysis , cough, dyspnea, wheezing, chest pain, palpitations, orthopnea, edema, abdominal pain, nausea, melena, diarrhea, constipation, flank pain, dysuria, hematuria, urinary  Frequency, nocturia, numbness, tingling, seizures,  Focal weakness, Loss of consciousness,  Tremor, insomnia, depression, anxiety, and suicidal ideation.     History   Social History  . Marital Status: Single    Spouse Name: N/A    Number of Children: N/A  . Years of Education: N/A   Occupational History  . Not on file.   Social History Main Topics  . Smoking status: Never Smoker   . Smokeless tobacco: Never Used  . Alcohol Use: No  . Drug Use: No  . Sexual Activity: No   Other Topics Concern  . Not on file   Social History Narrative  . No narrative on file    Objective:  Filed Vitals:   02/23/14 1523  BP: 134/84  Pulse: 69  Temp: 98.3 F (36.8 C)  Resp: 16     General appearance: alert, cooperative and appears stated age Ears: normal TM's and external ear canals both ears Throat: lips, mucosa, and tongue normal; teeth and gums normal Neck: no adenopathy, no carotid bruit, supple, symmetrical, trachea midline and thyroid not enlarged, symmetric, no tenderness/mass/nodules Back: symmetric, no  curvature. ROM normal. No CVA tenderness. Lungs: clear to auscultation bilaterally Heart: regular rate and rhythm, S1, S2 normal, no murmur, click, rub or gallop Abdomen: soft, non-tender; bowel sounds normal; no masses,  no organomegaly Pulses: 2+ and symmetric Skin: Skin color, texture, turgor normal. No rashes or lesions Lymph nodes: Cervical, supraclavicular, and axillary nodes normal.  Assessment and Plan:  Osteoarthritis of cervical spine discussed judicious use of tylenol, naprosyn,  Lidoderm patches,    Updated Medication  List Outpatient Encounter Prescriptions as of 02/23/2014  Medication Sig  . atenolol (TENORMIN) 25 MG tablet Take 1 tablet (25 mg total) by mouth daily.  . calcium citrate-vitamin D (CITRACAL+D) 315-200 MG-UNIT per tablet Take 2 tablets by mouth daily.  . celecoxib (CELEBREX) 200 MG capsule Take 1 capsule (200 mg total) by mouth daily.  Marland Kitchen ezetimibe (ZETIA) 10 MG tablet Take 1 tablet (10 mg total) by mouth daily.  . Misc Natural Product Nasal (GELONASAL NA) Place 2 sprays into the nose 2 (two) times daily as needed.  . Multiple Vitamins-Minerals (MULTIVITAMIN WITH MINERALS) tablet Take 1 tablet by mouth daily.  Marland Kitchen omeprazole (PRILOSEC) 20 MG capsule Take 1 capsule (20 mg total) by mouth daily.  Marland Kitchen omeprazole (PRILOSEC) 20 MG capsule Take 1 capsule (20 mg total) by mouth daily.  . [DISCONTINUED] celecoxib (CELEBREX) 200 MG capsule Take 1 capsule (200 mg total) by mouth daily.  . [DISCONTINUED] omeprazole (PRILOSEC) 20 MG capsule Take 1 capsule (20 mg total) by mouth daily.  . ondansetron (ZOFRAN ODT) 4 MG disintegrating tablet Take 1 tablet (4 mg total) by mouth every 8 (eight) hours as needed for nausea or vomiting.     No orders of the defined types were placed in this encounter.    No Follow-up on file.

## 2014-02-23 NOTE — Telephone Encounter (Signed)
Checking on prolia authorization

## 2014-02-23 NOTE — Patient Instructions (Addendum)
You can take 4 tylenol on a daily basis for arthritis in your neck.  IT CAN  BE COMBINED ON THE SAME DAYS AS NAPROXEN/CELEBREX   You can use the naproxen  500 mg or the celebrex 200 mg  two or three times WEEKLY. NOT COMBINED OR ON THE SAME DAY    Try the LIDODERM PATCHES  For your neck.  They can be combined with ANY OF THE OTHER MEDICATIONS   I HAVE GIVEN YOU A PRESCRIPTION FOR A NAUSEA MEDICATION CALLED ODANSETRON

## 2014-02-25 DIAGNOSIS — M47812 Spondylosis without myelopathy or radiculopathy, cervical region: Secondary | ICD-10-CM | POA: Insufficient documentation

## 2014-02-25 NOTE — Assessment & Plan Note (Signed)
discussed judicious use of tylenol, naprosyn,  Lidoderm patches,

## 2014-02-26 NOTE — Telephone Encounter (Signed)
Please see previous phone note regarding prolia

## 2014-02-26 NOTE — Telephone Encounter (Signed)
I have the completed BCBS P/A form from Dr. Derrel Nip, but I need her 5 digit BCBS Provider ID # for the form. I also need clinicals to send to [facility], can you fax those to me please? [phone] Thank you!

## 2014-02-27 NOTE — Telephone Encounter (Signed)
Faxed insurance info requested and notes. To [employer].

## 2014-02-28 ENCOUNTER — Telehealth: Payer: Self-pay | Admitting: Internal Medicine

## 2014-02-28 NOTE — Telephone Encounter (Signed)
[id number] sorry I had no idea what you meant,

## 2014-02-28 NOTE — Telephone Encounter (Signed)
I have received the clinicals and the completed prior authorization form for Prolia. I need Dr. Lupita Dawn 5 digit BCBS Provider ID # to put on the p/a. Can you tell me what that is please?  Thank you!

## 2014-03-01 NOTE — Telephone Encounter (Signed)
No problem, thank you for getting it.  I have sent the info to [facility] and will notify you as soon as I have a response. Thank you.

## 2014-03-05 ENCOUNTER — Ambulatory Visit: Payer: Self-pay | Admitting: Anesthesiology

## 2014-03-25 ENCOUNTER — Encounter: Payer: Self-pay | Admitting: Internal Medicine

## 2014-03-26 NOTE — Telephone Encounter (Signed)
Please advise if approval of Prolia.

## 2014-03-26 NOTE — Telephone Encounter (Signed)
I checked on pt's status w/[facility] last week and they are still waiting on the prior authorization from [facility].  I will check again this week and see if I can find out additional info. Thank you.

## 2014-03-28 NOTE — Telephone Encounter (Signed)
Spoke w/patient yesterday and she was concerned b/c she says BCBS told her she had to get Prolia thru [employer], however, after speaking w/Dee at [employer], pt does have coverage for the Prolia that does not have to go thru a specialty pharmacy.  We are currently waiting on the prior authorization from [facility].  Dee @ [place] is sending a request to mgmt for follow up on the p/a and I will notify patient once I have info.  Spoke w/patient to update her on the status. Thank you.

## 2014-04-03 ENCOUNTER — Other Ambulatory Visit: Payer: Self-pay | Admitting: *Deleted

## 2014-04-03 MED ORDER — EZETIMIBE 10 MG PO TABS: 10.0000 mg | ORAL_TABLET | Freq: Every day | ORAL | Status: DC

## 2014-04-04 ENCOUNTER — Ambulatory Visit: Payer: Self-pay | Admitting: Anesthesiology

## 2014-04-05 NOTE — Telephone Encounter (Signed)
Spoke to [place] at [facility] to follow up on status of pt's prior authorization for her injection. Nothing was updated in the system, but Brenda Cardenas is going to request mgmt to follow up.  I requested a c/b once follow up is complete. Thank you.

## 2014-04-06 ENCOUNTER — Ambulatory Visit: Payer: BC Managed Care – PPO | Admitting: Family Medicine

## 2014-04-06 NOTE — Telephone Encounter (Signed)
FYI for Prolia on [employer].

## 2014-04-07 ENCOUNTER — Encounter: Payer: Self-pay | Admitting: Internal Medicine

## 2014-04-09 ENCOUNTER — Telehealth: Payer: Self-pay | Admitting: Internal Medicine

## 2014-04-09 NOTE — Telephone Encounter (Signed)
DEXA printed and left up front for pickup

## 2014-04-09 NOTE — Telephone Encounter (Signed)
Any update on prolia for patient?

## 2014-04-09 NOTE — Telephone Encounter (Signed)
Pt wanted to find out when she will be able to get her Prolia shot. Please advise pt when to schedule shot.msn

## 2014-04-11 NOTE — Telephone Encounter (Signed)
I just sent you a response thru another phone note before seeing this one. Sorry. Pt has been approved. I sent her responsibility and authorization info in the other note. Thank you.

## 2014-04-11 NOTE — Telephone Encounter (Signed)
Just received pt's authorization from [facility] for her Prolia injection, reference [id number]. Patient's secondary insurance will coordinate benefits and considers the Part B deductible and coinsurance at 100% after the patient satisfies a $35 copay. This means pt's estimated responsibility will be $35 whether and office visit is billed or not. I have sent a copy of the summary of benefits and the authorization to be scanned into her chart. If you have further questions please let me know. Thank you.

## 2014-04-12 NOTE — Telephone Encounter (Signed)
Ordered. Please schedule patient an appointment for nurse visit next week. Thanks!

## 2014-04-12 NOTE — Telephone Encounter (Signed)
Brenda Cardenas has been approved see note from [employer].

## 2014-04-12 NOTE — Telephone Encounter (Signed)
Brenda Cardenas at your location will order it, but I'm not sure how long it takes to get it.

## 2014-04-12 NOTE — Telephone Encounter (Signed)
So how long before we receive the medication and how do we get it?

## 2014-04-25 ENCOUNTER — Ambulatory Visit (INDEPENDENT_AMBULATORY_CARE_PROVIDER_SITE_OTHER): Payer: Medicare Other | Admitting: *Deleted

## 2014-04-25 DIAGNOSIS — M810 Age-related osteoporosis without current pathological fracture: Secondary | ICD-10-CM

## 2014-04-25 MED ORDER — DENOSUMAB 60 MG/ML ~~LOC~~ SOLN
60.0000 mg | Freq: Once | SUBCUTANEOUS | Status: AC
  Administered 2014-04-25: 60 mg via SUBCUTANEOUS

## 2014-05-23 ENCOUNTER — Ambulatory Visit (INDEPENDENT_AMBULATORY_CARE_PROVIDER_SITE_OTHER): Payer: Medicare Other | Admitting: Podiatry

## 2014-05-23 ENCOUNTER — Ambulatory Visit: Payer: Medicare Other | Admitting: Podiatry

## 2014-05-23 ENCOUNTER — Encounter: Payer: Self-pay | Admitting: Podiatry

## 2014-05-23 DIAGNOSIS — M722 Plantar fascial fibromatosis: Secondary | ICD-10-CM

## 2014-05-23 DIAGNOSIS — M7672 Peroneal tendinitis, left leg: Secondary | ICD-10-CM

## 2014-05-23 DIAGNOSIS — M7750 Other enthesopathy of unspecified foot: Secondary | ICD-10-CM

## 2014-05-23 NOTE — Progress Notes (Signed)
She presents today for followup of her posterior tibial tendon dysfunction complaining of left heel pain and left lateral heel and ankle pain. She also states that her [place] brace is starting to cause her pain and is starting to wear out.  Objective: Vital signs are stable she is alert and oriented x3. She has pain on palpation medial continued tubercle of the left heel pain on palpation of the peroneal tendons there does appear to be fluid within the tendon sheath. Evaluation of the brace demonstrates some early wear to the most plantar distal medial aspect of the brace. This is far from being worn out at this point.   Assessment: Peroneal tendinitis with plantar fasciitis left. Posterior tibial tendon dysfunction left.  Plan: Discussed etiology pathology conservative versus surgical therapies. Injected Kenalog and local anesthetic to the plantar medial aspect of the left heel. Injected Kenalog and local anesthetic to the peroneal tendon sheath. She states that she felt immediate relief with both injections. And she was able to walk out relatively pain-free.

## 2014-05-30 ENCOUNTER — Encounter: Payer: Self-pay | Admitting: Internal Medicine

## 2014-05-30 DIAGNOSIS — E785 Hyperlipidemia, unspecified: Secondary | ICD-10-CM

## 2014-05-30 DIAGNOSIS — Z79899 Other long term (current) drug therapy: Secondary | ICD-10-CM

## 2014-05-30 DIAGNOSIS — R5381 Other malaise: Secondary | ICD-10-CM

## 2014-05-30 DIAGNOSIS — R5383 Other fatigue: Principal | ICD-10-CM

## 2014-05-30 DIAGNOSIS — E559 Vitamin D deficiency, unspecified: Secondary | ICD-10-CM

## 2014-05-31 NOTE — Telephone Encounter (Signed)
Labs ordered prior to physical, per e mail reauest > patient  needs lab appt.prior to Sept 21 Please  make sure she has  45 minutes for the physical.

## 2014-06-11 ENCOUNTER — Other Ambulatory Visit (INDEPENDENT_AMBULATORY_CARE_PROVIDER_SITE_OTHER): Payer: Medicare Other

## 2014-06-11 DIAGNOSIS — E785 Hyperlipidemia, unspecified: Secondary | ICD-10-CM

## 2014-06-11 DIAGNOSIS — R5381 Other malaise: Secondary | ICD-10-CM

## 2014-06-11 DIAGNOSIS — E559 Vitamin D deficiency, unspecified: Secondary | ICD-10-CM

## 2014-06-11 DIAGNOSIS — Z79899 Other long term (current) drug therapy: Secondary | ICD-10-CM

## 2014-06-11 DIAGNOSIS — R5383 Other fatigue: Principal | ICD-10-CM

## 2014-06-11 LAB — CBC WITH DIFFERENTIAL/PLATELET
BASOS ABS: 0.1 10*3/uL (ref 0.0–0.1)
Basophils Relative: 0.9 % (ref 0.0–3.0)
EOS ABS: 0.2 10*3/uL (ref 0.0–0.7)
Eosinophils Relative: 2 % (ref 0.0–5.0)
HEMATOCRIT: 41.4 % (ref 36.0–46.0)
Hemoglobin: 13.6 g/dL (ref 12.0–15.0)
LYMPHS ABS: 2.2 10*3/uL (ref 0.7–4.0)
Lymphocytes Relative: 22.6 % (ref 12.0–46.0)
MCHC: 32.9 g/dL (ref 30.0–36.0)
MCV: 96.8 fl (ref 78.0–100.0)
MONO ABS: 0.6 10*3/uL (ref 0.1–1.0)
Monocytes Relative: 5.8 % (ref 3.0–12.0)
Neutro Abs: 6.8 10*3/uL (ref 1.4–7.7)
Neutrophils Relative %: 68.7 % (ref 43.0–77.0)
PLATELETS: 144 10*3/uL — AB (ref 150.0–400.0)
RBC: 4.28 Mil/uL (ref 3.87–5.11)
RDW: 16.3 % — AB (ref 11.5–15.5)
WBC: 9.9 10*3/uL (ref 4.0–10.5)

## 2014-06-11 LAB — COMPREHENSIVE METABOLIC PANEL
ALK PHOS: 59 U/L (ref 39–117)
ALT: 19 U/L (ref 0–35)
AST: 24 U/L (ref 0–37)
Albumin: 3.8 g/dL (ref 3.5–5.2)
BILIRUBIN TOTAL: 0.8 mg/dL (ref 0.2–1.2)
BUN: 22 mg/dL (ref 6–23)
CO2: 26 mEq/L (ref 19–32)
Calcium: 8.7 mg/dL (ref 8.4–10.5)
Chloride: 106 mEq/L (ref 96–112)
Creatinine, Ser: 0.8 mg/dL (ref 0.4–1.2)
GFR: 69.25 mL/min (ref >60.00)
Glucose, Bld: 83 mg/dL (ref 70–99)
Potassium: 4.4 mEq/L (ref 3.5–5.1)
SODIUM: 139 meq/L (ref 135–145)
Total Protein: 6.7 g/dL (ref 6.0–8.3)

## 2014-06-11 LAB — LIPID PANEL
CHOLESTEROL: 198 mg/dL (ref 0–200)
HDL: 52.3 mg/dL (ref >39.00)
LDL CALC: 119 mg/dL — AB (ref 0–99)
NonHDL: 145.7
TRIGLYCERIDES: 136 mg/dL (ref 0.0–149.0)
Total CHOL/HDL Ratio: 4
VLDL: 27.2 mg/dL (ref 0.0–40.0)

## 2014-06-11 LAB — TSH: TSH: 0.28 u[IU]/mL — AB (ref 0.35–4.50)

## 2014-06-11 LAB — VITAMIN D 25 HYDROXY (VIT D DEFICIENCY, FRACTURES): VITD: 35.82 ng/mL (ref 30.00–100.00)

## 2014-06-12 ENCOUNTER — Other Ambulatory Visit: Payer: Self-pay | Admitting: Internal Medicine

## 2014-06-12 ENCOUNTER — Encounter: Payer: Self-pay | Admitting: Internal Medicine

## 2014-06-12 DIAGNOSIS — R7989 Other specified abnormal findings of blood chemistry: Secondary | ICD-10-CM

## 2014-06-18 ENCOUNTER — Encounter: Payer: Self-pay | Admitting: Internal Medicine

## 2014-06-18 ENCOUNTER — Ambulatory Visit (INDEPENDENT_AMBULATORY_CARE_PROVIDER_SITE_OTHER): Payer: Medicare Other | Admitting: Internal Medicine

## 2014-06-18 VITALS — BP 126/70 | HR 61 | Temp 97.6°F | Resp 14 | Ht 66.0 in | Wt 173.2 lb

## 2014-06-18 DIAGNOSIS — Z23 Encounter for immunization: Secondary | ICD-10-CM

## 2014-06-18 DIAGNOSIS — N3090 Cystitis, unspecified without hematuria: Secondary | ICD-10-CM

## 2014-06-18 DIAGNOSIS — Z0000 Encounter for general adult medical examination without abnormal findings: Secondary | ICD-10-CM

## 2014-06-18 DIAGNOSIS — M47812 Spondylosis without myelopathy or radiculopathy, cervical region: Secondary | ICD-10-CM

## 2014-06-18 DIAGNOSIS — R635 Abnormal weight gain: Secondary | ICD-10-CM

## 2014-06-18 DIAGNOSIS — R350 Frequency of micturition: Secondary | ICD-10-CM

## 2014-06-18 LAB — URINALYSIS, ROUTINE W REFLEX MICROSCOPIC
BILIRUBIN URINE: NEGATIVE
Hgb urine dipstick: NEGATIVE
Ketones, ur: NEGATIVE
LEUKOCYTES UA: NEGATIVE
Nitrite: NEGATIVE
PH: 6.5 (ref 5.0–8.0)
RBC / HPF: NONE SEEN (ref 0–?)
SPECIFIC GRAVITY, URINE: 1.015 (ref 1.000–1.030)
Total Protein, Urine: NEGATIVE
URINE GLUCOSE: NEGATIVE
Urobilinogen, UA: 0.2 (ref 0.0–1.0)

## 2014-06-18 MED ORDER — ESTROGENS, CONJUGATED 0.625 MG/GM VA CREA: 1.0000 | TOPICAL_CREAM | Freq: Every day | VAGINAL | Status: DC

## 2014-06-18 MED ORDER — OMEPRAZOLE 20 MG PO CPDR: 20.0000 mg | DELAYED_RELEASE_CAPSULE | Freq: Two times a day (BID) | ORAL | Status: DC

## 2014-06-18 NOTE — Patient Instructions (Addendum)
Your cholesterol is fine,  But your HDL and triglycerides will both improve  With less ice cream and  More daily intake of olive oil, and nuts  We are repeating your urine today to make sure your infection has cleared  I recommend trying the vaginal estrogen for a full month, if you can afford it.  I wil send an rx to your pharmacy     You can drop off a urine for testing here if needed for recurrence of symptoms .  Do NOT open the container until you are ready to use it..  You had your annual Medicare wellness exam today  You received the new pneumonia vaccine  And your flu vaccine today.  We will contact you with the urine results  Health Maintenance Adopting a healthy lifestyle and getting preventive care can go a long way to promote health and wellness. Talk with your health care provider about what schedule of regular examinations is right for you. This is a good chance for you to check in with your provider about disease prevention and staying healthy. In between checkups, there are plenty of things you can do on your own. Experts have done a lot of research about which lifestyle changes and preventive measures are most likely to keep you healthy. Ask your health care provider for more information. WEIGHT AND DIET  Eat a healthy diet  Be sure to include plenty of vegetables, fruits, low-fat dairy products, and lean protein.  Do not eat a lot of foods high in solid fats, added sugars, or salt.  Get regular exercise. This is one of the most important things you can do for your health.  Most adults should exercise for at least 150 minutes each week. The exercise should increase your heart rate and make you sweat (moderate-intensity exercise).  Most adults should also do strengthening exercises at least twice a week. This is in addition to the moderate-intensity exercise.  Maintain a healthy weight  Body mass index (BMI) is a measurement that can be used to identify possible weight  problems. It estimates body fat based on height and weight. Your health care provider can help determine your BMI and help you achieve or maintain a healthy weight.  For females 16 years of age and older:   A BMI below 18.5 is considered underweight.  A BMI of 18.5 to 24.9 is normal.  A BMI of 25 to 29.9 is considered overweight.  A BMI of 30 and above is considered obese.  Watch levels of cholesterol and blood lipids  You should start having your blood tested for lipids and cholesterol at 78 years of age, then have this test every 5 years.  You may need to have your cholesterol levels checked more often if:  Your lipid or cholesterol levels are high.  You are older than 78 years of age.  You are at high risk for heart disease.  CANCER SCREENING   Lung Cancer  Lung cancer screening is recommended for adults 79-67 years old who are at high risk for lung cancer because of a history of smoking.  A yearly low-dose CT scan of the lungs is recommended for people who:  Currently smoke.  Have quit within the past 15 years.  Have at least a 30-pack-year history of smoking. A pack year is smoking an average of one pack of cigarettes a day for 1 year.  Yearly screening should continue until it has been 15 years since you quit.  Yearly  screening should stop if you develop a health problem that would prevent you from having lung cancer treatment.  Breast Cancer  Practice breast self-awareness. This means understanding how your breasts normally appear and feel.  It also means doing regular breast self-exams. Let your health care provider know about any changes, no matter how small.  If you are in your 20s or 30s, you should have a clinical breast exam (CBE) by a health care provider every 1-3 years as part of a regular health exam.  If you are 52 or older, have a CBE every year. Also consider having a breast X-ray (mammogram) every year.  If you have a family history of breast  cancer, talk to your health care provider about genetic screening.  If you are at high risk for breast cancer, talk to your health care provider about having an MRI and a mammogram every year.  Breast cancer gene (BRCA) assessment is recommended for women who have family members with BRCA-related cancers. BRCA-related cancers include:  Breast.  Ovarian.  Tubal.  Peritoneal cancers.  Results of the assessment will determine the need for genetic counseling and BRCA1 and BRCA2 testing. Cervical Cancer Routine pelvic examinations to screen for cervical cancer are no longer recommended for nonpregnant women who are considered low risk for cancer of the pelvic organs (ovaries, uterus, and vagina) and who do not have symptoms. A pelvic examination may be necessary if you have symptoms including those associated with pelvic infections. Ask your health care provider if a screening pelvic exam is right for you.   The Pap test is the screening test for cervical cancer for women who are considered at risk.  If you had a hysterectomy for a problem that was not cancer or a condition that could lead to cancer, then you no longer need Pap tests.  If you are older than 65 years, and you have had normal Pap tests for the past 10 years, you no longer need to have Pap tests.  If you have had past treatment for cervical cancer or a condition that could lead to cancer, you need Pap tests and screening for cancer for at least 20 years after your treatment.  If you no longer get a Pap test, assess your risk factors if they change (such as having a new sexual partner). This can affect whether you should start being screened again.  Some women have medical problems that increase their chance of getting cervical cancer. If this is the case for you, your health care provider may recommend more frequent screening and Pap tests.  The human papillomavirus (HPV) test is another test that may be used for cervical  cancer screening. The HPV test looks for the virus that can cause cell changes in the cervix. The cells collected during the Pap test can be tested for HPV.  The HPV test can be used to screen women 62 years of age and older. Getting tested for HPV can extend the interval between normal Pap tests from three to five years.  An HPV test also should be used to screen women of any age who have unclear Pap test results.  After 78 years of age, women should have HPV testing as often as Pap tests.  Colorectal Cancer  This type of cancer can be detected and often prevented.  Routine colorectal cancer screening usually begins at 78 years of age and continues through 78 years of age.  Your health care provider may recommend screening at an  earlier age if you have risk factors for colon cancer.  Your health care provider may also recommend using home test kits to check for hidden blood in the stool.  A small camera at the end of a tube can be used to examine your colon directly (sigmoidoscopy or colonoscopy). This is done to check for the earliest forms of colorectal cancer.  Routine screening usually begins at age 55.  Direct examination of the colon should be repeated every 5-10 years through 78 years of age. However, you may need to be screened more often if early forms of precancerous polyps or small growths are found. Skin Cancer  Check your skin from head to toe regularly.  Tell your health care provider about any new moles or changes in moles, especially if there is a change in a mole's shape or color.  Also tell your health care provider if you have a mole that is larger than the size of a pencil eraser.  Always use sunscreen. Apply sunscreen liberally and repeatedly throughout the day.  Protect yourself by wearing long sleeves, pants, a wide-brimmed hat, and sunglasses whenever you are outside. HEART DISEASE, DIABETES, AND HIGH BLOOD PRESSURE   Have your blood pressure checked at  least every 1-2 years. High blood pressure causes heart disease and increases the risk of stroke.  If you are between 14 years and 41 years old, ask your health care provider if you should take aspirin to prevent strokes.  Have regular diabetes screenings. This involves taking a blood sample to check your fasting blood sugar level.  If you are at a normal weight and have a low risk for diabetes, have this test once every three years after 78 years of age.  If you are overweight and have a high risk for diabetes, consider being tested at a younger age or more often. PREVENTING INFECTION  Hepatitis B  If you have a higher risk for hepatitis B, you should be screened for this virus. You are considered at high risk for hepatitis B if:  You were born in a country where hepatitis B is common. Ask your health care provider which countries are considered high risk.  Your parents were born in a high-risk country, and you have not been immunized against hepatitis B (hepatitis B vaccine).  You have HIV or AIDS.  You use needles to inject street drugs.  You live with someone who has hepatitis B.  You have had sex with someone who has hepatitis B.  You get hemodialysis treatment.  You take certain medicines for conditions, including cancer, organ transplantation, and autoimmune conditions. Hepatitis C  Blood testing is recommended for:  Everyone born from [birth year] through 1963.  Anyone with known risk factors for hepatitis C. Sexually transmitted infections (STIs)  You should be screened for sexually transmitted infections (STIs) including gonorrhea and chlamydia if:  You are sexually active and are younger than 78 years of age.  You are older than 78 years of age and your health care provider tells you that you are at risk for this type of infection.  Your sexual activity has changed since you were last screened and you are at an increased risk for chlamydia or gonorrhea. Ask your health  care provider if you are at risk.  If you do not have HIV, but are at risk, it may be recommended that you take a prescription medicine daily to prevent HIV infection. This is called pre-exposure prophylaxis (PrEP). You are considered at risk  if:  You are sexually active and do not regularly use condoms or know the HIV status of your partner(s).  You take drugs by injection.  You are sexually active with a partner who has HIV. Talk with your health care provider about whether you are at high risk of being infected with HIV. If you choose to begin PrEP, you should first be tested for HIV. You should then be tested every 3 months for as long as you are taking PrEP.  PREGNANCY   If you are premenopausal and you may become pregnant, ask your health care provider about preconception counseling.  If you may become pregnant, take 400 to 800 micrograms (mcg) of folic acid every day.  If you want to prevent pregnancy, talk to your health care provider about birth control (contraception). OSTEOPOROSIS AND MENOPAUSE   Osteoporosis is a disease in which the bones lose minerals and strength with aging. This can result in serious bone fractures. Your risk for osteoporosis can be identified using a bone density scan.  If you are 63 years of age or older, or if you are at risk for osteoporosis and fractures, ask your health care provider if you should be screened.  Ask your health care provider whether you should take a calcium or vitamin D supplement to lower your risk for osteoporosis.  Menopause may have certain physical symptoms and risks.  Hormone replacement therapy may reduce some of these symptoms and risks. Talk to your health care provider about whether hormone replacement therapy is right for you.  HOME CARE INSTRUCTIONS   Schedule regular health, dental, and eye exams.  Stay current with your immunizations.   Do not use any tobacco products including cigarettes, chewing tobacco, or  electronic cigarettes.  If you are pregnant, do not drink alcohol.  If you are breastfeeding, limit how much and how often you drink alcohol.  Limit alcohol intake to no more than 1 drink per day for nonpregnant women. One drink equals 12 ounces of beer, 5 ounces of wine, or 1 ounces of hard liquor.  Do not use street drugs.  Do not share needles.  Ask your health care provider for help if you need support or information about quitting drugs.  Tell your health care provider if you often feel depressed.  Tell your health care provider if you have ever been abused or do not feel safe at home. Document Released: 03/30/2011 Document Revised: 01/29/2014 Document Reviewed: 08/16/2013 [facility] Patient Information 2015 [place], [place]. This information is not intended to replace advice given to you by your health care provider. Make sure you discuss any questions you have with your health care provider.

## 2014-06-18 NOTE — Progress Notes (Signed)
Patient ID: Brenda Cardenas, female   DOB: [birth year], 80 y.o.   MRN: [id number]  The patient is here for annual Medicare wellness examination and management of other chronic and acute problems.  She had a mammogram,  breast exam and pelvic exam by Dr Sabra Heck at [facility] this summer.   1) Discussed vaginal estrogen with her for management of urinary frequency and incontinence. Prior trial was not long enough per discussion today.  Last UA was in august by [facility] .       2) She has had an evaluation  In July 8th at [facility] for back pain that included noncontrasted CT abd, and pelvis which was negative for stones.  Noted DDD and scoliosis  On   Plain films, was referred to [facility] with MRI who advised her not to have surgery, consider stopping the epidurals , and go back on Aleve.  Elliott concurred,  advised 4 tylenol daily and one aleve with a meal,  And increased PPI to two prilosec daily .  She i njoot having any GI symptoms ,, never needed nausea meds.   3) Exercising using the  NuStep 4 times per week  4) Concerned about her thyroid and her lipids. Has been eating more ice cream and eggs   5) Having carpal tunnel surgery by [facility] after a positive EMG nerve conduction studies were ordered by Vance Peper and done by Dr Manuella Ghazi at [facility].   Tried the brace which did not help.,  Right hand  Numbness of fingers 3 4 and 5   With pain .  Cervical issue was ruled out with review of MRI>       The risk factors are reflected in the social history.  The roster of all physicians providing medical care to patient - is listed in the Snapshot section of the chart.  Activities of daily living:  The patient is 100% independent in all ADLs: dressing, toileting, feeding as well as independent mobility  Home safety : The patient has smoke detectors in the home. They wear seatbelts.  There are no firearms at home. There is no violence in the home.   There is no risks for hepatitis, STDs or  HIV. There is no   history of blood transfusion. They have no travel history to infectious disease endemic areas of the world.  The patient has seen their dentist in the last six month. They have seen their eye doctor in the last year. They admit to slight hearing difficulty with regard to whispered voices and some television programs.  They have deferred audiologic testing in the last year.  They do not  have excessive sun exposure. Discussed the need for sun protection: hats, long sleeves and use of sunscreen if there is significant sun exposure.   Diet: the importance of a healthy diet is discussed. They do have a healthy diet.  The benefits of regular aerobic exercise were discussed. She walks 4 times per week ,  AT LEAST 20 minutes.   Depression screen: there are no signs or vegative symptoms of depression- irritability, change in appetite, anhedonia, sadness/tearfullness.  Cognitive assessment: the patient manages all their financial and personal affairs and is actively engaged. They could relate day,date,year and events; recalled 2/3 objects at 3 minutes; performed clock-face test normally.  The following portions of the patient's history were reviewed and updated as appropriate: allergies, current medications, past family history, past medical history,  past surgical history, past social history  and  problem list.  Visual acuity was not assessed per patient preference since she has regular follow up with her ophthalmologist. Hearing and body mass index were assessed and reviewed.   During the course of the visit the patient was educated and counseled about appropriate screening and preventive services including : fall prevention , diabetes screening, nutrition counseling, colorectal cancer screening, and recommended immunizations.    Objective:    BP 126/70  Pulse 61  Temp(Src) 97.6 F (36.4 C) (Oral)  Resp 14  Ht 5\' 6"  (1.676 m)  Wt 173 lb 4 oz (78.586 kg)  BMI 27.98 kg/m2  SpO2  99%  General appearance: alert, cooperative and appears stated age Ears: normal TM's and external ear canals both ears Throat: lips, mucosa, and tongue normal; teeth and gums normal Neck: no adenopathy, no carotid bruit, supple, symmetrical, trachea midline and thyroid not enlarged, symmetric, no tenderness/mass/nodules Back: symmetric, no curvature. ROM normal. No CVA tenderness. Lungs: clear to auscultation bilaterally Heart: regular rate and rhythm, S1, S2 normal, no murmur, click, rub or gallop Abdomen: soft, non-tender; bowel sounds normal; no masses,  no organomegaly Pulses: 2+ and symmetric Skin: Skin color, texture, turgor normal. No rashes or lesions Lymph nodes: Cervical, supraclavicular, and axillary nodes normal.  Assessment and Plan:  Encounter for Medicare annual wellness exam Annual Medicare wellness  exam was done as well as a comprehensive physical exam and management of acute and chronic conditions .  During the course of the visit the patient was educated and counseled about appropriate screening and preventive services including : fall prevention , diabetes screening, nutrition counseling, colorectal cancer screening, and recommended immunizations.  Printed recommendations for health maintenance screenings was given.   Recurrent cystitis With recent negative cultures.  Discussed vaginal estrogen,  But afterone night she encountered vaginal bleeding and has discontinued medication .  Repeat UA today was abnormal,  Culture sent   Osteoarthritis of cervical spine Neurosurgery advised to avoid surgery,  Manage with tylenol, aleve, epidurals prn  and regular exercise.   Weight gain, abnormal Secondary to dietary indiscretions.,  I have addressed  BMI and recommended low glycemic index diet and regular exercise a minimum of 5 days per week.     Updated Medication List Outpatient Encounter Prescriptions as of 06/18/2014  Medication Sig  . acetaminophen (TYLENOL) 325 MG  tablet Take 650 mg by mouth 2 (two) times daily.  Marland Kitchen atenolol (TENORMIN) 25 MG tablet Take 1 tablet (25 mg total) by mouth daily.  . calcium citrate-vitamin D (CITRACAL+D) 315-200 MG-UNIT per tablet Take 2 tablets by mouth daily.  Marland Kitchen denosumab (PROLIA) 60 MG/ML SOLN injection Inject 60 mg into the skin every 6 (six) months. Administer in upper arm, thigh, or abdomen  . ezetimibe (ZETIA) 10 MG tablet Take 1 tablet (10 mg total) by mouth daily.  . Multiple Vitamins-Minerals (MULTIVITAMIN WITH MINERALS) tablet Take 1 tablet by mouth daily.  . naproxen sodium (ANAPROX) 220 MG tablet Take 220 mg by mouth daily.  Marland Kitchen omeprazole (PRILOSEC) 20 MG capsule Take 1 capsule (20 mg total) by mouth 2 (two) times daily before a meal.  . [DISCONTINUED] omeprazole (PRILOSEC) 20 MG capsule Take 1 capsule (20 mg total) by mouth daily.  Marland Kitchen conjugated estrogens (PREMARIN) vaginal cream Place 1 Applicatorful vaginally at bedtime. For two weeks,  Then twice weekly therafter  . Misc Natural Product Nasal (GELONASAL NA) Place 2 sprays into the nose 2 (two) times daily as needed.  . [DISCONTINUED] celecoxib (CELEBREX) 200 MG capsule Take  1 capsule (200 mg total) by mouth daily.  . [DISCONTINUED] omeprazole (PRILOSEC) 20 MG capsule Take 1 capsule (20 mg total) by mouth daily.  . [DISCONTINUED] ondansetron (ZOFRAN ODT) 4 MG disintegrating tablet Take 1 tablet (4 mg total) by mouth every 8 (eight) hours as needed for nausea or vomiting.

## 2014-06-18 NOTE — Progress Notes (Signed)
Pre-visit discussion using our clinic review tool. No additional management support is needed unless otherwise documented below in the visit note.  

## 2014-06-19 ENCOUNTER — Encounter: Payer: Self-pay | Admitting: Internal Medicine

## 2014-06-19 LAB — CULTURE, URINE COMPREHENSIVE
Colony Count: NO GROWTH
Organism ID, Bacteria: NO GROWTH

## 2014-06-19 NOTE — Assessment & Plan Note (Signed)
Neurosurgery advised to avoid surgery,  Manage with tylenol, aleve, epidurals prn  and regular exercise.

## 2014-06-19 NOTE — Assessment & Plan Note (Signed)
Secondary to dietary indiscretions.,  I have addressed  BMI and recommended low glycemic index diet and regular exercise a minimum of 5 days per week.

## 2014-06-19 NOTE — Assessment & Plan Note (Signed)

## 2014-06-19 NOTE — Assessment & Plan Note (Addendum)
With recent negative cultures.  Discussed vaginal estrogen,  But afterone night she encountered vaginal bleeding and has discontinued medication .  Repeat UA today was abnormal,  Culture sent

## 2014-06-20 ENCOUNTER — Encounter: Payer: Self-pay | Admitting: Internal Medicine

## 2014-06-25 ENCOUNTER — Ambulatory Visit: Payer: Self-pay | Admitting: Anesthesiology

## 2014-07-02 ENCOUNTER — Ambulatory Visit: Payer: Self-pay | Admitting: Internal Medicine

## 2014-07-11 ENCOUNTER — Other Ambulatory Visit (INDEPENDENT_AMBULATORY_CARE_PROVIDER_SITE_OTHER): Payer: Medicare Other

## 2014-07-11 DIAGNOSIS — R7989 Other specified abnormal findings of blood chemistry: Secondary | ICD-10-CM

## 2014-07-12 ENCOUNTER — Encounter: Payer: Self-pay | Admitting: Internal Medicine

## 2014-07-12 LAB — T4 AND TSH
T4, Total: 8.8 ug/dL (ref 4.5–12.0)
TSH: 1.17 u[IU]/mL (ref 0.450–4.500)

## 2014-07-16 ENCOUNTER — Ambulatory Visit: Payer: Self-pay | Admitting: Unknown Physician Specialty

## 2014-07-23 ENCOUNTER — Ambulatory Visit: Payer: Self-pay | Admitting: General Practice

## 2014-08-01 ENCOUNTER — Ambulatory Visit: Payer: Self-pay | Admitting: Nurse Practitioner

## 2014-08-02 ENCOUNTER — Encounter: Payer: Self-pay | Admitting: Internal Medicine

## 2014-08-02 DIAGNOSIS — R7989 Other specified abnormal findings of blood chemistry: Secondary | ICD-10-CM | POA: Insufficient documentation

## 2014-08-06 ENCOUNTER — Telehealth: Payer: Self-pay | Admitting: Internal Medicine

## 2014-08-06 MED ORDER — ATENOLOL 25 MG PO TABS: 25.0000 mg | ORAL_TABLET | Freq: Every day | ORAL | Status: DC

## 2014-08-06 NOTE — Telephone Encounter (Signed)
[facility] as requested.

## 2014-08-06 NOTE — Telephone Encounter (Signed)
Brenda Cardenas stopped by with a Rx form for Dr. Derrel Nip. I've placed the form in her box. Thank you.

## 2014-08-13 ENCOUNTER — Ambulatory Visit: Payer: Self-pay | Admitting: Anesthesiology

## 2014-08-17 ENCOUNTER — Other Ambulatory Visit: Payer: Self-pay | Admitting: Internal Medicine

## 2014-08-17 ENCOUNTER — Encounter: Payer: Self-pay | Admitting: Internal Medicine

## 2014-08-20 MED ORDER — EZETIMIBE 10 MG PO TABS: 10.0000 mg | ORAL_TABLET | Freq: Every day | ORAL | Status: DC

## 2014-08-20 MED ORDER — ATENOLOL 25 MG PO TABS
25.0000 mg | ORAL_TABLET | Freq: Every day | ORAL | Status: DC
Start: 2014-08-20 — End: 2015-02-15

## 2014-09-25 ENCOUNTER — Encounter: Payer: Self-pay | Admitting: Internal Medicine

## 2014-09-26 ENCOUNTER — Telehealth: Payer: Self-pay | Admitting: *Deleted

## 2014-09-26 NOTE — Telephone Encounter (Signed)
Pt sent mychart, due for Prolia in January. Thanks!

## 2014-10-02 NOTE — Telephone Encounter (Signed)
I have sent pt's info for Prolia insurance verification and will notify you once I have a response. Thank you. °

## 2014-10-11 ENCOUNTER — Ambulatory Visit: Payer: Self-pay | Admitting: Ophthalmology

## 2014-10-29 ENCOUNTER — Telehealth: Payer: Self-pay | Admitting: Internal Medicine

## 2014-10-29 DIAGNOSIS — E785 Hyperlipidemia, unspecified: Secondary | ICD-10-CM

## 2014-10-29 DIAGNOSIS — R7989 Other specified abnormal findings of blood chemistry: Secondary | ICD-10-CM

## 2014-10-29 DIAGNOSIS — K2100 Gastro-esophageal reflux disease with esophagitis, without bleeding: Secondary | ICD-10-CM

## 2014-10-29 DIAGNOSIS — R195 Other fecal abnormalities: Secondary | ICD-10-CM

## 2014-10-29 DIAGNOSIS — Z79899 Other long term (current) drug therapy: Secondary | ICD-10-CM

## 2014-10-29 NOTE — Telephone Encounter (Signed)
Sent mychart

## 2014-10-29 NOTE — Telephone Encounter (Signed)
fasting labs ordered.

## 2014-10-29 NOTE — Telephone Encounter (Signed)
Pt request to have lab work done prior to have next visit in march. No lab order in the system. Please advise.msn

## 2014-10-29 NOTE — Telephone Encounter (Signed)
I have rec'd pt's Prolia insurance verification.  Ms. Potvin currently has a [employer] prior authorization still effective, auth #[id number], effective 04/09/2014-04/09/2015. Pt's primary insurance, [employer] will cover 80% after $166 deductible is met of which pt has met $0. Then her secondary, [employer] will coordinate benefits and considers the Part B deductible and co-insurance at 100% after pt satisfies a $39 co-pay.  This means pt's estimated responsibility will be $39 whether an OV is billed or not.  Please make pt aware this is only an estimate and we will not know the exact amt until both insurances pay. I have sent a copy of the summary of benefits to be scanned into her chart. If you have any questions, please let me know. Thank you.

## 2014-11-08 ENCOUNTER — Ambulatory Visit (INDEPENDENT_AMBULATORY_CARE_PROVIDER_SITE_OTHER): Payer: Medicare Other | Admitting: *Deleted

## 2014-11-08 DIAGNOSIS — M810 Age-related osteoporosis without current pathological fracture: Secondary | ICD-10-CM

## 2014-11-08 MED ORDER — DENOSUMAB 60 MG/ML ~~LOC~~ SOLN
60.0000 mg | Freq: Once | SUBCUTANEOUS | Status: AC
  Administered 2014-11-08: 60 mg via SUBCUTANEOUS

## 2014-12-10 ENCOUNTER — Other Ambulatory Visit (INDEPENDENT_AMBULATORY_CARE_PROVIDER_SITE_OTHER): Payer: Medicare Other

## 2014-12-10 DIAGNOSIS — R946 Abnormal results of thyroid function studies: Secondary | ICD-10-CM

## 2014-12-10 DIAGNOSIS — R7989 Other specified abnormal findings of blood chemistry: Secondary | ICD-10-CM

## 2014-12-10 DIAGNOSIS — E785 Hyperlipidemia, unspecified: Secondary | ICD-10-CM | POA: Diagnosis not present

## 2014-12-10 DIAGNOSIS — Z79899 Other long term (current) drug therapy: Secondary | ICD-10-CM

## 2014-12-10 LAB — COMPREHENSIVE METABOLIC PANEL
ALK PHOS: 75 U/L (ref 39–117)
ALT: 23 U/L (ref 0–35)
AST: 22 U/L (ref 0–37)
Albumin: 3.8 g/dL (ref 3.5–5.2)
BUN: 19 mg/dL (ref 6–23)
CO2: 26 mEq/L (ref 19–32)
CREATININE: 0.69 mg/dL (ref 0.40–1.20)
Calcium: 9.4 mg/dL (ref 8.4–10.5)
Chloride: 108 mEq/L (ref 96–112)
GFR: 86.79 mL/min (ref >60.00)
Glucose, Bld: 107 mg/dL — ABNORMAL HIGH (ref 70–99)
Potassium: 4.1 mEq/L (ref 3.5–5.1)
Sodium: 139 mEq/L (ref 135–145)
Total Bilirubin: 0.5 mg/dL (ref 0.2–1.2)
Total Protein: 6.2 g/dL (ref 6.0–8.3)

## 2014-12-10 LAB — LIPID PANEL
CHOL/HDL RATIO: 3
Cholesterol: 172 mg/dL (ref 0–200)
HDL: 52.9 mg/dL (ref >39.00)
LDL CALC: 100 mg/dL — AB (ref 0–99)
NonHDL: 119.1
TRIGLYCERIDES: 97 mg/dL (ref 0.0–149.0)
VLDL: 19.4 mg/dL (ref 0.0–40.0)

## 2014-12-11 LAB — T4 AND TSH
T4, Total: 8.7 ug/dL (ref 4.5–12.0)
TSH: 1.24 u[IU]/mL (ref 0.450–4.500)

## 2014-12-12 ENCOUNTER — Encounter: Payer: Self-pay | Admitting: Internal Medicine

## 2014-12-13 ENCOUNTER — Ambulatory Visit (INDEPENDENT_AMBULATORY_CARE_PROVIDER_SITE_OTHER): Payer: Medicare Other | Admitting: Podiatry

## 2014-12-13 VITALS — BP 156/80 | HR 80 | Resp 16

## 2014-12-13 DIAGNOSIS — M7672 Peroneal tendinitis, left leg: Secondary | ICD-10-CM

## 2014-12-13 DIAGNOSIS — M722 Plantar fascial fibromatosis: Secondary | ICD-10-CM

## 2014-12-13 DIAGNOSIS — M25572 Pain in left ankle and joints of left foot: Secondary | ICD-10-CM

## 2014-12-13 DIAGNOSIS — G5752 Tarsal tunnel syndrome, left lower limb: Secondary | ICD-10-CM | POA: Diagnosis not present

## 2014-12-13 NOTE — Progress Notes (Signed)
Patient ID: Brenda Cardenas, female   DOB: 06/21/1932, 81 y.o.   MRN: [id number]  Subjective: Brenda Cardenas  presents today for an unscheduled appointment fot follow-up evaluation of left foot and ankle pain. She has been under the care of Dr. Milinda Pointer who periodically provides injections which help relieve her symptoms. She is also previously had an [place] made for which she states helps some although she does feel that a regular ankle stabilizer helps more. She wears the brace intermittently and not all the time. She doesn't the [place] brace is starting to wear down on the bottom. She states that she has been on her feet more over the past couple weeks inserted have reoccurrence of symptoms. She points the offered aspect of the ankle as well as the plantar heel were most of her pain is. She denies any recent injury or trauma. She states that previously the area was swollen however it has decreased. Denies any redness or increase in warmth overlying the area.   Objective: AAO 3, NAD Neurovascular status unchanged. There is tenderness palpation along the plantar aspect of the left calcaneus at the insertion the plantar fascia. There is no pain with lateral compression of the calcaneus or with vibratory sensation. No pain on the posterior aspect of the calcaneus. There is a significant decrease in medial arch height upon weightbearing with medial prominences. There is pain on the lateral aspect of the foot overlying the sinus tarsi. There is mild discomfort with subtalar joint range of motion and is somewhat limited. There is no crepitation. There is no other areas of pinpoint bony tenderness or pain with vibratory sensation on the foot or ankle. There is mild overlying edema without any associated erythema or increase in warmth. No open lesions or pre-ulcer lesions identified No pain with calf compression, swelling, warmth, erythema. Evaluation of the brace reveals normal wear patterns the bottom of the  brace.  Assessment:  81 year old female with sinus tarsi pain, heel pain; due to underlying posterior tibial tendon dysfunction.  Plan: -I discussed at length with the patient various treatment options both conservative and surgical including alternatives, risks, complications. -At this point she is requesting steroid injections into the area to help decrease the pain. Risks, complications of the injections were discussed which she understands verbally consents. I discussed the plantar fascial injection as well as an injection into the sinus tarsi. Under sterile Betadine preparation a total of 1.5 mL of a mixture of Dexamethasone phosphate and 2% lidocaine plain was infiltrated into the maximal tenderness on the lateral aspect of the sinus tarsi into the subtalar joint. Bandage was applied. Also a mixture of Kenalog 10, 0.5% Marcaine plain and 2% lidocaine plain for total of 2.5 mL is infiltrated into the area of maximal tenderness along the plantar medial aspect of the calcaneus. The insertion the plantar fascia around the plantar fascia. Patient was applied. Patient tolerated the injections well any complications. Post injection care was discussed the patient. -Continue with [place] brace. If the normal angle stabilizer helps more can continue that as well. I also discussed with her evaluation at [facility] to see if there is another AFO which may be more accommodative. She wishes to hold off on this.  -Follow-up as needed. In the meantime encouraged to call the office with any questions, concerns, change in symptoms.

## 2014-12-17 ENCOUNTER — Ambulatory Visit (INDEPENDENT_AMBULATORY_CARE_PROVIDER_SITE_OTHER): Payer: Medicare Other | Admitting: Internal Medicine

## 2014-12-17 ENCOUNTER — Encounter: Payer: Self-pay | Admitting: Internal Medicine

## 2014-12-17 VITALS — BP 128/72 | HR 54 | Temp 97.9°F | Resp 14 | Ht 66.0 in | Wt 168.0 lb

## 2014-12-17 DIAGNOSIS — E663 Overweight: Secondary | ICD-10-CM | POA: Diagnosis not present

## 2014-12-17 DIAGNOSIS — J305 Allergic rhinitis due to food: Secondary | ICD-10-CM

## 2014-12-17 DIAGNOSIS — N3080 Other cystitis without hematuria: Secondary | ICD-10-CM

## 2014-12-17 DIAGNOSIS — K219 Gastro-esophageal reflux disease without esophagitis: Secondary | ICD-10-CM | POA: Diagnosis not present

## 2014-12-17 DIAGNOSIS — N3090 Cystitis, unspecified without hematuria: Secondary | ICD-10-CM

## 2014-12-17 DIAGNOSIS — K625 Hemorrhage of anus and rectum: Secondary | ICD-10-CM

## 2014-12-17 MED ORDER — IPRATROPIUM BROMIDE 0.06 % NA SOLN: NASAL | Status: DC

## 2014-12-17 NOTE — Assessment & Plan Note (Signed)
Patient has been offered the option of dropping off urine in a sterile container for analysis since her urologist has moved to [facility]

## 2014-12-17 NOTE — Assessment & Plan Note (Signed)
Trial of atrovent nasal spray since patient is bothered by symptoms.

## 2014-12-17 NOTE — Assessment & Plan Note (Signed)
Secondary to constipation treated with Dulcolax,  GI evaluation resulted in cipro and priosec change

## 2014-12-17 NOTE — Assessment & Plan Note (Signed)
I have addressed  BMI and recommended a low glycemic index diet utilizing smaller more frequent meals to increase metabolism.  I have also recommended that patient start exercising with a goal of 30 minutes of aerobic exercise a minimum of 5 days per week.  

## 2014-12-17 NOTE — Assessment & Plan Note (Signed)
Trial of omeprazole bid

## 2014-12-17 NOTE — Progress Notes (Signed)
Patient ID: Brenda Cardenas, female   DOB: 17-May-1932, 81 y.o.   MRN: [id number]  Patient Active Problem List   Diagnosis Date Noted  . Rhinitis due food 12/17/2014  . Osteoarthritis of cervical spine 02/25/2014  . Tibialis posterior tendonitis 12/26/2013  . Osteoporosis, unspecified 06/18/2013  . Encounter for Medicare annual wellness exam 06/18/2013  . Hiatal hernia 06/16/2013  . GERD (gastroesophageal reflux disease) 05/02/2013  . Rectal bleeding 05/02/2013  . S/P total knee replacement 05/02/2013  . Post-nasal drip 05/02/2013  . Overweight 05/02/2013  . Recurrent cystitis 05/02/2013    Subjective:  CC:   Chief Complaint  Patient presents with  . Follow-up    6 month check up.Problem with constipation in January called Dr. Tiffany Kocher.  . Cough    Constantly has to clear throat worse after eatoing    HPI:   Brenda Cardenas is a 81 y.o. female who presents for   6 month follow up on chronic conditions.  1) Constant throat clearing.  Denies cough and dyspnea.  Symptoms are worse after eating.  No history of dysphagia, has chronic dry mouth, Using biotine and ACT for dry mouth ,  And using simply saline at night for dry nose. No voice change/   2) Has had right hand carpal tunnel release by Dr. Marry Guan ,  Avoided pain meds until 4 day later then became constipated from the hydrocodone  Became concerned about the excessive mucus she passed,  Call GI was told to use a laxative.   Used dulcolax,  Had an explosive stool accompanied by rectal bleeding .  Saw Kem Kays at Urgent Care and was prescribed cipro oral due to slightly elevated WBC count (< 12K) along with  HC suppositories. Thankfully patient takes a probiotic daily   3) GERD:  Treated by Dr Vira Agar rx'd prilosec bid which has helped with the reflux .  Using a wedge prn    4) left cataract surgery since last visit .  Enjoying the 20/20 vision and the improved perception of colors. Surgery was done by dr Mordecai Rasmussen Jan 14th.   Had a complication,  wore an eye patch for a week . Has a small cataract on right eye.   5) Seeing [facility] for foot and ankle pain .treated By Jacqualyn Posey with Depomedrol injection in heel  And Kenalog at the 5th metatarsal.  Has helped a little, wearing sneakers helps the most,  All other shoes aggravate it.   6) Overweight:  Has  Gained 2 lbs,  Admits to having a sweet tooth, using Nustep for exercise, not aggravating foot /ankle pain      Past Medical History  Diagnosis Date  . Arthritis   . GERD (gastroesophageal reflux disease)   . Hyperlipidemia   . Hypertension   . Chronic kidney disease   . Colon polyps     Past Surgical History  Procedure Laterality Date  . Tonsillectomy and adenoidectomy    . Joint replacement Right July 2009    [facility]   . Appendectomy  1959  . Cholecystectomy  1995       The following portions of the patient's history were reviewed and updated as appropriate: Allergies, current medications, and problem list.    Review of Systems:   Patient denies headache, fevers, malaise, unintentional weight loss, skin rash, eye pain, sinus congestion and sinus pain, sore throat, dysphagia,  hemoptysis , cough, dyspnea, wheezing, chest pain, palpitations, orthopnea, edema, abdominal pain, nausea, melena, diarrhea, constipation, flank pain, dysuria, hematuria, urinary  Frequency, nocturia, numbness, tingling, seizures,  Focal weakness, Loss of consciousness,  Tremor, insomnia, depression, anxiety, and suicidal ideation.     History   Social History  . Marital Status: Single    Spouse Name: N/A  . Number of Children: N/A  . Years of Education: N/A   Occupational History  . Not on file.   Social History Main Topics  . Smoking status: Never Smoker   . Smokeless tobacco: Never Used  . Alcohol Use: No  . Drug Use: No  . Sexual Activity: No   Other Topics Concern  . Not on file   Social History Narrative    Objective:  Filed Vitals:   12/17/14 1038   BP: 128/72  Pulse: 54  Temp: 97.9 F (36.6 C)  Resp: 14     General appearance: alert, cooperative and appears stated age Ears: normal TM's and external ear canals both ears Throat: lips, mucosa, and tongue normal; teeth and gums normal Neck: no adenopathy, no carotid bruit, supple, symmetrical, trachea midline and thyroid not enlarged, symmetric, no tenderness/mass/nodules Back: symmetric, no curvature. ROM normal. No CVA tenderness. Lungs: clear to auscultation bilaterally Heart: regular rate and rhythm, S1, S2 normal, no murmur, click, rub or gallop Abdomen: soft, non-tender; bowel sounds normal; no masses,  no organomegaly Pulses: 2+ and symmetric Skin: Skin color, texture, turgor normal. No rashes or lesions Lymph nodes: Cervical, supraclavicular, and axillary nodes normal.  Assessment and Plan:  Rhinitis due food Trial of atrovent nasal spray since patient is bothered by symptoms.    GERD (gastroesophageal reflux disease) Trial of omeprazole bid    Recurrent cystitis Patient has been offered the option of dropping off urine in a sterile container for analysis since her urologist has moved to [facility]   Overweight I have addressed  BMI and recommended a low glycemic index diet utilizing smaller more frequent meals to increase metabolism.  I have also recommended that patient start exercising with a goal of 30 minutes of aerobic exercise a minimum of 5 days per week   Rectal bleeding Secondary to constipation treated with Dulcolax,  GI evaluation resulted in cipro and priosec change   A total of 25 minutes of face to face time was spent with patient more than half of which was spent in counselling and coordination of care    Updated Medication List Outpatient Encounter Prescriptions as of 12/17/2014  Medication Sig  . acetaminophen (TYLENOL) 325 MG tablet Take 650 mg by mouth 2 (two) times daily.  Marland Kitchen atenolol (TENORMIN) 25 MG tablet Take 1 tablet (25 mg total)  by mouth daily.  . calcium citrate-vitamin D (CITRACAL+D) 315-200 MG-UNIT per tablet Take 2 tablets by mouth daily.  Marland Kitchen denosumab (PROLIA) 60 MG/ML SOLN injection Inject 60 mg into the skin every 6 (six) months. Administer in upper arm, thigh, or abdomen  . ezetimibe (ZETIA) 10 MG tablet Take 1 tablet (10 mg total) by mouth daily.  . Multiple Vitamins-Minerals (MULTIVITAMIN WITH MINERALS) tablet Take 1 tablet by mouth daily.  . naproxen sodium (ANAPROX) 220 MG tablet Take 220 mg by mouth daily. PRN only  . omeprazole (PRILOSEC) 20 MG capsule Take 1 capsule (20 mg total) by mouth 2 (two) times daily before a meal.  . conjugated estrogens (PREMARIN) vaginal cream Place 1 Applicatorful vaginally at bedtime. For two weeks,  Then twice weekly therafter (Patient not taking: Reported on 12/17/2014)  . ipratropium (ATROVENT) 0.06 % nasal spray 1 spray each side before each meal  .  Misc Natural Product Nasal (GELONASAL NA) Place 2 sprays into the nose 2 (two) times daily as needed.     No orders of the defined types were placed in this encounter.    No Follow-up on file.

## 2015-01-19 NOTE — Op Note (Signed)
PATIENT NAME:  Brenda Cardenas, Brenda Cardenas MR#:  [id number] DATE OF BIRTH:  1932-05-13  DATE OF PROCEDURE:  07/23/2014  PREOPERATIVE DIAGNOSIS: Right carpal tunnel syndrome.   POSTOPERATIVE DIAGNOSIS: Right carpal tunnel syndrome.   PROCEDURE PERFORMED: Right carpal tunnel release.   SURGEON: Laurice Record. Holley Bouche., MD   ANESTHESIA: General.   ESTIMATED BLOOD LOSS: Minimal.   DRAINS: None.  TOURNIQUET TIME: 18 minutes.   INDICATIONS FOR SURGERY: The patient is an 81 year old right-hand-dominant female who has been seen for complaints of numbness and pain to both hands, with the right more symptomatic than the left. EMG nerve conduction studies were consistent with severe right carpal tunnel syndrome. After discussion of the risks and benefits of surgical intervention, the patient expressed understanding of the risks and benefits, and agreed with plans for surgical intervention.   PROCEDURE IN DETAIL: The patient was brought to the operating room and, after adequate general anesthesia was achieved, a tourniquet was placed on the patient's upper right arm. The patient's right hand and arm were cleaned and prepped with alcohol and DuraPrep and draped in the usual sterile fashion. A "timeout" was performed as per usual protocol. The right upper extremity was exsanguinated using an Esmarch, and the tourniquet was inflated to 250 mmHg. Loupe magnification was used throughout the procedure. A curvilinear incision was made just ulnar to the thenar palmar crease. Dissection was carried down through the palmar fascia to the transverse carpal ligament. The transverse carpal ligament was sharply incised, taking care to protect the underlying structures within the carpal tunnel. Complete release of the transverse carpal ligament was achieved. Inspection of the median nerve showed significant compression at the level of the transverse carpal ligament with fusiform position and relative flattening at the site. This improved  after release. No evidence of lipoma or ganglion cyst within the tunnel. The wound was irrigated with copious amounts of normal saline with antibiotic solution. The skin was reapproximated using interrupted sutures of  #5-0 nylon. Then, 10 mL of 0.25% Marcaine was injected along the incision site. A sterile dressing was applied followed by application of a volar wrist splint. Tourniquet was deflated after a total tourniquet time of 18 minutes.   The patient tolerated the procedure well. She was transported to the recovery room in stable condition.   ____________________________ Laurice Record. Holley Bouche., MD jph:ST D: 07/23/2014 16:12:32 ET T: 07/24/2014 00:43:11 ET JOB#: [id number]  cc: Laurice Record. Holley Bouche., MD, <Dictator> JAMES P Holley Bouche MD ELECTRONICALLY SIGNED 07/27/2014 9:54

## 2015-01-27 NOTE — Op Note (Signed)
PATIENT NAME:  Brenda Cardenas, Brenda Cardenas MR#:  [id number] DATE OF BIRTH:  June 11, 1932  DATE OF PROCEDURE:  10/11/2014  PREOPERATIVE DIAGNOSIS: Senile cataract, left eye.  POSTOPERATIVE DIAGNOSIS: Senile cataract, left eye.  ULTRASOUND TIME: 22% of 1 minute, 14 seconds. CDE 12.5.   LENS IMPLANT: MA60AC 19.0 diopter posterior chamber intraocular lens placed in the ciliary sulcus with optic capture.   SURGEON: Wyonia Hough, MD.   ANESTHESIA: Topical with tetracaine drops and Xylocaine gel augmented with intracameral 1% lidocaine.   COMPLICATIONS: Rupture posterior capsule requiring limited anterior vitrectomy and placement of the lens in the ciliary sulcus.    ESTIMATED BLOOD LOSS: 0.   DESCRIPTION OF PROCEDURE: The patient was identified in the holding room and transported to the operating suite and placed in supine position underneath the operating microscope. The left eye was identified as the operative eye, and it was prepped and draped in the usual sterile ophthalmic fashion.   A 1 mm clear corneal paracentesis incision was made at the 1:30 position. The anterior chamber was filled with preservative free 1% lidocaine. Viscoat was then placed into the anterior chamber. A 2.4 mm keratome was used to make a near clear corneal incision at the 10:30 position. The capsulorrhexis was performed using capsulorrhexis forceps and a cystotome. Hydrodissection and hydrodelineation were performed using balanced salt solution. Phacoemulsification was then used to remove the lens nucleus. During the removal of the nucleus, an opening in the posterior capsule was noted in the 4-5 o'clock position. Additional Viscoat was placed through the opening to support the lens and avoid vitreous prolapse into the anterior chamber. A Koch spatula was used to elevate the remaining lens nucleus into the anterior chamber. Phacoemulsification was continued to remove the last piece of nucleus from the anterior chamber. The  paracentesis incision was widened to 1.5 mm. A new paracentesis incision was made at the 9 o'clock position of 1.5 mm width. Bimanual anterior vitrectomy was performed to remove the remaining lens particles and cortex. The anterior capsule remained round and intact. Kenalog diluted to 4 mg/mL was injected into the anterior chamber. There was no visible vitreous within the anterior chamber using Kenalog staining. The ciliary sulcus was then dilated with Provisc. An MA60AC 19.0 diopter was then injected into the ciliary sulcus. One haptic was noted to go posterior to the anterior capsule. This was elevated using a Kuglen hook. The lens was positioned in the ciliary sulcus, as well as the haptics. The lens was then pushed through the anterior capsule for optic capture. The remaining viscoelastic was aspirated from the eye using the bimanual vitrectomy handpieces. Wounds were hydrated with balanced salt solution. All wounds were checked to ensure there was no vitreous present. There was no visible vitreous in the anterior chamber. Miostat was placed in the anterior chamber, followed by cefuroxime 0.1 mL of a 10 mg/mL solution for a dose of 1 mg of cefuroxime. The eye was given topical Vigamox drops and Maxitrol ointment and shielded. The patient was taken to the recovery room in stable condition.    ____________________________ Wyonia Hough, MD crb:mw D: 10/11/2014 08:53:05 ET T: 10/11/2014 11:53:23 ET JOB#: [id number]  cc: Wyonia Hough, MD, <Dictator>    Leandrew Koyanagi MD ELECTRONICALLY SIGNED 10/17/2014 10:52

## 2015-02-07 LAB — HM MAMMOGRAPHY: HM Mammogram: NEGATIVE

## 2015-02-11 ENCOUNTER — Encounter: Payer: Self-pay | Admitting: *Deleted

## 2015-02-15 ENCOUNTER — Other Ambulatory Visit: Payer: Self-pay | Admitting: Internal Medicine

## 2015-02-20 ENCOUNTER — Telehealth: Payer: Self-pay | Admitting: Internal Medicine

## 2015-02-20 NOTE — Telephone Encounter (Signed)
Notified patient she had used search engine located with in my chart and was directed to  a page of definition not a Diagnosis for her. Patient voiced understanding and was relieved to know her mammogram was normal.

## 2015-03-25 ENCOUNTER — Encounter: Payer: Self-pay | Admitting: Internal Medicine

## 2015-04-07 ENCOUNTER — Encounter: Payer: Self-pay | Admitting: Internal Medicine

## 2015-04-08 ENCOUNTER — Telehealth: Payer: Self-pay

## 2015-04-08 NOTE — Telephone Encounter (Signed)
Rose, Can you please start the process for Brenda Cardenas's Prolia injection.  She is due to receive it in the beginning of August. Thanks

## 2015-04-15 NOTE — Telephone Encounter (Signed)
I have submitted pt's info for [place] verification and will notify you once I have a response. Thank you.

## 2015-04-17 ENCOUNTER — Ambulatory Visit: Payer: Medicare Other | Admitting: Anesthesiology

## 2015-04-30 NOTE — Telephone Encounter (Signed)
Faxed p/a form and DEXA scan from 06/29/2013 to [facility]

## 2015-04-30 NOTE — Telephone Encounter (Signed)
Form completed and in Dr. Lupita Dawn blue folder for signature, then I will fax back to [facility].

## 2015-04-30 NOTE — Telephone Encounter (Signed)
Faxed back to [facility].

## 2015-04-30 NOTE — Telephone Encounter (Signed)
I have rec'd pt's insurance verification for Prolia and her secondary insurance, [employer] is requiring a prior authorization.  I have completed most of the p/a form, however, either you or Dr. Derrel Nip need to complete questions 1-3 on the form and Dr. Derrel Nip needs to sign.  I am faxing the form to your attn at [phone]. Please return the completed form to my attn via fax [phone].  Thank you.

## 2015-05-01 NOTE — Telephone Encounter (Signed)
Brenda Cardenas from [place] ([phone]; ph [phone]; fax [phone]) called in ref to [place].  She says that b/c the question, "has the patient failed or is unable to tolerate at least one oral bisphosphonate or has contraindications to receiving treatment with and oral bisphosphonate" was answered no, then patient will be denied for Prolia unless she has chronic kidney disease or a h/o breast cancer.  I couldn't see any documentation in her chart of this, but I'm not clinical so I may be over looking something.  Brenda Cardenas is wanting to know if that answer should have been yes and if so which meds has she tried or does pt have h/o breast cancer or chronic kidney disease.  Could you please help me w/this? Thank you.

## 2015-05-01 NOTE — Telephone Encounter (Signed)
The form was not copied into the chart,  (unfortunately, it should aways be( so i cannot confirm what was on it (, but patient has already failed 2 piror treatments with oral alendronate and with Evista.  ( FAILED BECAUSE her osteoporosis has progressed.

## 2015-05-02 NOTE — Telephone Encounter (Signed)
Contacted Brenda Cardenas at [facility] and gave Dr. Lupita Dawn info to her.  She said she would update and fax over p/a later today.

## 2015-05-07 NOTE — Telephone Encounter (Signed)
Spoke to [facility] and she says she faxed p/a 05/02/2015, but I have not seen it.  She is going to re-fax today.

## 2015-05-08 ENCOUNTER — Telehealth: Payer: Self-pay | Admitting: Internal Medicine

## 2015-05-08 NOTE — Telephone Encounter (Signed)
Pt dropped letter about prolia shot. Letter in Dr. Lupita Dawn box. Pt has been scheduled for prolia shot/msn

## 2015-05-08 NOTE — Telephone Encounter (Signed)
It has been ordered.

## 2015-05-08 NOTE — Telephone Encounter (Signed)
I finally have the p/a from [facility] for pt's Prolia injection.  [employer] authorization #[id number] is effective 04/30/2015-04/29/2016.  Pt's primary insurance, [employer] has a deductible of $166 (pt has met) then covers 80% leaving the pt w/an estimated responsibility of a 20% co-insurance (approx $180).  Then, her secondary insurance, [employer] will coordinate benefits and considers the Part B deductible and co-insurance at 100% up the the secondary plan's allowed amt. If an OV is billed then a $92 co-pay applies.  This means that pt's estimated responsibility w/out an OV will be $0; w/an OV pt's estimated responsibility will be $92.  Please advise pt this is only an estimate and we will not know an exact amt until both insurances have paid.  I have sent a copy of the p/a and the summary of benefits to be scanned into pt's chart. Please let me know if you have any questions. Thank youl.

## 2015-05-08 NOTE — Telephone Encounter (Signed)
Please order prolia shot. Pt has been scheduled for 8/24. Pt is aware of cost.msn

## 2015-05-08 NOTE — Telephone Encounter (Signed)
Can we order the Prolia?

## 2015-05-13 NOTE — Telephone Encounter (Signed)
See original Prolia note, ordered injection on 05/13/15

## 2015-05-13 NOTE — Telephone Encounter (Signed)
Ordered injection on 05/13/15

## 2015-05-15 NOTE — Telephone Encounter (Signed)
Medication is in the fridge for her appointment next week.

## 2015-05-22 ENCOUNTER — Ambulatory Visit (INDEPENDENT_AMBULATORY_CARE_PROVIDER_SITE_OTHER): Payer: Medicare Other

## 2015-05-22 DIAGNOSIS — M810 Age-related osteoporosis without current pathological fracture: Secondary | ICD-10-CM

## 2015-05-22 MED ORDER — DENOSUMAB 60 MG/ML ~~LOC~~ SOLN
60.0000 mg | Freq: Once | SUBCUTANEOUS | Status: AC
  Administered 2015-05-22: 60 mg via SUBCUTANEOUS

## 2015-05-22 NOTE — Progress Notes (Signed)
Patient came in for Prolia injection.  Received in right arm.  Patient tolerated well.  Gave her a reminder card for her next injection in February.

## 2015-06-03 NOTE — Telephone Encounter (Signed)
Encounter closed

## 2015-06-13 ENCOUNTER — Encounter: Payer: Self-pay | Admitting: Podiatry

## 2015-06-13 ENCOUNTER — Ambulatory Visit (INDEPENDENT_AMBULATORY_CARE_PROVIDER_SITE_OTHER): Payer: Medicare Other | Admitting: Podiatry

## 2015-06-13 VITALS — BP 137/64 | HR 60 | Resp 16

## 2015-06-13 DIAGNOSIS — M722 Plantar fascial fibromatosis: Secondary | ICD-10-CM | POA: Diagnosis not present

## 2015-06-13 DIAGNOSIS — G5752 Tarsal tunnel syndrome, left lower limb: Secondary | ICD-10-CM

## 2015-06-13 DIAGNOSIS — M76822 Posterior tibial tendinitis, left leg: Secondary | ICD-10-CM

## 2015-06-13 DIAGNOSIS — Q6652 Congenital pes planus, left foot: Secondary | ICD-10-CM

## 2015-06-13 DIAGNOSIS — M25572 Pain in left ankle and joints of left foot: Secondary | ICD-10-CM

## 2015-06-14 NOTE — Progress Notes (Signed)
Patient ID: Brenda Cardenas, female   DOB: 1932/04/30, 81 y.o.   MRN: [id number]  Subjective: Brenda Cardenas  presents today for follow-up evaluation of left foot and ankle pain. She states that she periodically gets injections into her "ankle" and to the heel. She has been wearing an ASO brace but she is asking for a new brace today. She states the injections gave her relief after last appointment up until about a few weeks ago, when the pain started to reoccur. She denies any recent injury or trauma. There is no swelling or redness. No other complaints at this time and no acute changes otherwise. She denies any systemic complaints as fevers, chills, nausea, vomiting.  Objective: AAO 3, NAD Neurovascular status unchanged. There is tenderness palpation along the plantar aspect of the left calcaneus at the insertion the plantar fascia. There is no pain with lateral compression of the calcaneus or with vibratory sensation. No pain on the posterior aspect of the calcaneus. There is no pain on the course of plantar fascial in the arch of the foot.  There is a significant decrease in medial arch height upon weightbearing with medial prominences. Hammertoes are present There is pain on the lateral aspect of the foot overlying the sinus tarsi. There is mild discomfort with subtalar joint range of motion and is limited. There is no crepitation.  There are no other areas of pinpoint bony tenderness or pain with vibratory sensation on the foot or ankle. There is no overlying edema or any erythema or increase in warmth. Pre-ulcerative callus to the left distal second toe due to underlying hammertoe. Upon debridement no underlying ulceration, drainage or other signs of infection. No open lesions or other pre-ulcer lesions identified Nails are dystrophic, discolored, hypertrophic. No swelling erythema or drainage. No tenderness of the nails at this time. No pain with calf compression, swelling, warmth,  erythema. Evaluation of the brace reveals normal wear patterns the bottom of the brace.  Assessment:  81 year old female with sinus tarsi pain, heel pain; due to significant flatfoot.  Plan: -Treatment options discussed including all alternatives, risks, and complications Patient elects to proceed with steroid injection into the left heel. Under sterile skin preparation, a total of 2.5cc of kenalog 10, 0.5% Marcaine plain, and 2% lidocaine plain were infiltrated into the symptomatic area without complication. A band-aid was applied. Patient tolerated the injection well without complication. Post-injection care with discussed with the patient. Discussed with the patient to ice the area over the next couple of days to help prevent a steroid flare.  -She also is requesting steroid injection in the sinus tarsi. I discussed risks, occasions for which she understands verbally consents. Under sterile conditions a total of 1 mL of a mixture of Kenalog 10, 2% lidocaine plain was infiltrated into the lateral aspect of the foot over the area of maximal tenderness on the sinus tarsi. She tolerated injection well without any complications. Post injection care was discussed. -Dispensed Tri-Lock ankle brace per her request. I discussed with her new areas and the brace should be hinged however she does not want to pursue a further brace. -Continue stretching exercises. Ice to the area as needed. -Hyperkeratotic lesion left second toe sharply debrided without complication/bleeding. Offloading pad was dispensed. -She does not want her nails trimmed today as she gets a Administrator, sports. -Follow-up as needed. In the meantime encouraged to call the office with any questions, concerns, change in symptoms.  Brenda Cardenas, DPM

## 2015-06-17 ENCOUNTER — Other Ambulatory Visit (INDEPENDENT_AMBULATORY_CARE_PROVIDER_SITE_OTHER): Payer: Medicare Other

## 2015-06-17 ENCOUNTER — Telehealth: Payer: Self-pay | Admitting: *Deleted

## 2015-06-17 DIAGNOSIS — E785 Hyperlipidemia, unspecified: Secondary | ICD-10-CM

## 2015-06-17 DIAGNOSIS — Z79899 Other long term (current) drug therapy: Secondary | ICD-10-CM

## 2015-06-17 LAB — COMPREHENSIVE METABOLIC PANEL
ALBUMIN: 3.8 g/dL (ref 3.5–5.2)
ALK PHOS: 70 U/L (ref 39–117)
ALT: 28 U/L (ref 0–35)
AST: 26 U/L (ref 0–37)
BILIRUBIN TOTAL: 0.4 mg/dL (ref 0.2–1.2)
BUN: 24 mg/dL — AB (ref 6–23)
CHLORIDE: 108 meq/L (ref 96–112)
CO2: 26 mEq/L (ref 19–32)
CREATININE: 0.72 mg/dL (ref 0.40–1.20)
Calcium: 8.9 mg/dL (ref 8.4–10.5)
GFR: 82.52 mL/min (ref >60.00)
Glucose, Bld: 90 mg/dL (ref 70–99)
Potassium: 4.2 mEq/L (ref 3.5–5.1)
SODIUM: 141 meq/L (ref 135–145)
TOTAL PROTEIN: 6.2 g/dL (ref 6.0–8.3)

## 2015-06-17 LAB — LIPID PANEL
CHOLESTEROL: 189 mg/dL (ref 0–200)
HDL: 50.6 mg/dL (ref >39.00)
LDL CALC: 117 mg/dL — AB (ref 0–99)
NonHDL: 138.15
Total CHOL/HDL Ratio: 4
Triglycerides: 107 mg/dL (ref 0.0–149.0)
VLDL: 21.4 mg/dL (ref 0.0–40.0)

## 2015-06-17 NOTE — Telephone Encounter (Signed)
Labs and dx?  

## 2015-06-19 ENCOUNTER — Encounter: Payer: Self-pay | Admitting: Internal Medicine

## 2015-06-24 ENCOUNTER — Encounter: Payer: Self-pay | Admitting: Internal Medicine

## 2015-06-24 ENCOUNTER — Ambulatory Visit (INDEPENDENT_AMBULATORY_CARE_PROVIDER_SITE_OTHER): Payer: Medicare Other | Admitting: Internal Medicine

## 2015-06-24 VITALS — BP 128/76 | HR 57 | Temp 98.0°F | Resp 12 | Ht 66.0 in | Wt 161.5 lb

## 2015-06-24 DIAGNOSIS — Z0000 Encounter for general adult medical examination without abnormal findings: Secondary | ICD-10-CM | POA: Diagnosis not present

## 2015-06-24 DIAGNOSIS — K2100 Gastro-esophageal reflux disease with esophagitis, without bleeding: Secondary | ICD-10-CM

## 2015-06-24 DIAGNOSIS — N3080 Other cystitis without hematuria: Secondary | ICD-10-CM

## 2015-06-24 DIAGNOSIS — M4120 Other idiopathic scoliosis, site unspecified: Secondary | ICD-10-CM

## 2015-06-24 DIAGNOSIS — Z23 Encounter for immunization: Secondary | ICD-10-CM | POA: Diagnosis not present

## 2015-06-24 DIAGNOSIS — R5383 Other fatigue: Secondary | ICD-10-CM

## 2015-06-24 DIAGNOSIS — N3090 Cystitis, unspecified without hematuria: Secondary | ICD-10-CM

## 2015-06-24 DIAGNOSIS — E663 Overweight: Secondary | ICD-10-CM

## 2015-06-24 DIAGNOSIS — M810 Age-related osteoporosis without current pathological fracture: Secondary | ICD-10-CM

## 2015-06-24 LAB — CBC WITH DIFFERENTIAL/PLATELET
BASOS PCT: 0.6 % (ref 0.0–3.0)
Basophils Absolute: 0.1 10*3/uL (ref 0.0–0.1)
EOS ABS: 0.2 10*3/uL (ref 0.0–0.7)
EOS PCT: 2.4 % (ref 0.0–5.0)
HEMATOCRIT: 40.5 % (ref 36.0–46.0)
HEMOGLOBIN: 13.4 g/dL (ref 12.0–15.0)
LYMPHS PCT: 19.3 % (ref 12.0–46.0)
Lymphs Abs: 2 10*3/uL (ref 0.7–4.0)
MCHC: 33.1 g/dL (ref 30.0–36.0)
MCV: 97.2 fl (ref 78.0–100.0)
MONOS PCT: 6.2 % (ref 3.0–12.0)
Monocytes Absolute: 0.6 10*3/uL (ref 0.1–1.0)
Neutro Abs: 7.3 10*3/uL (ref 1.4–7.7)
Neutrophils Relative %: 71.5 % (ref 43.0–77.0)
Platelets: 173 10*3/uL (ref 150.0–400.0)
RBC: 4.17 Mil/uL (ref 3.87–5.11)
RDW: 16.9 % — AB (ref 11.5–15.5)
WBC: 10.3 10*3/uL (ref 4.0–10.5)

## 2015-06-24 LAB — TSH: TSH: 0.87 u[IU]/mL (ref 0.35–4.50)

## 2015-06-24 MED ORDER — EZETIMIBE 10 MG PO TABS: 10.0000 mg | ORAL_TABLET | Freq: Every day | ORAL | Status: DC

## 2015-06-24 NOTE — Patient Instructions (Signed)

## 2015-06-24 NOTE — Assessment & Plan Note (Signed)
No UTI in one year.

## 2015-06-24 NOTE — Progress Notes (Signed)
Patient ID: Brenda Cardenas, female    DOB: 01-30-1932  Age: 81 y.o. MRN: [id number]  The patient is here for annual Medicare wellness examination and management of other chronic and acute problems.    The risk factors are reflected in the social history.    Needs a paper rx for Zetia   Sees GYN in [facility],  Does breast pelvic and rectum.      The roster of all physicians providing medical care to patient - is listed in the Snapshot section of the chart.  Activities of daily living:  The patient is 100% independent in all ADLs: dressing, toileting, feeding as well as independent mobility  Home safety : The patient has smoke detectors in the home. They wear seatbelts.  There are no firearms at home. There is no violence in the home.   There is no risks for hepatitis, STDs or HIV. There is no   history of blood transfusion. They have no travel history to infectious disease endemic areas of the world.  The patient has seen their dentist in the last six month. They have seen their eye doctor in the last year. They admit to slight hearing difficulty with regard to whispered voices and some television programs.  They have deferred audiologic testing in the last year.  They do not  have excessive sun exposure. Discussed the need for sun protection: hats, long sleeves and use of sunscreen if there is significant sun exposure.   Diet: the importance of a healthy diet is discussed. They do have a healthy diet.  The benefits of regular aerobic exercise were discussed. She walks 4 times per week ,  20 minutes.   Depression screen: there are no signs or vegative symptoms of depression- irritability, change in appetite, anhedonia, sadness/tearfullness.  Cognitive assessment: the patient manages all their financial and personal affairs and is actively engaged. They could relate day,date,year and events; recalled 2/3 objects at 3 minutes; performed clock-face test normally.  The following portions of  the patient's history were reviewed and updated as appropriate: allergies, current medications, past family history, past medical history,  past surgical history, past social history  and problem list.  Visual acuity was not assessed per patient preference since she has regular follow up with her ophthalmologist. Hearing and body mass index were assessed and reviewed.   During the course of the visit the patient was educated and counseled about appropriate screening and preventive services including : fall prevention , diabetes screening, nutrition counseling, colorectal cancer screening, and recommended immunizations.    CC: The primary encounter diagnosis was Other fatigue. Diagnoses of Encounter for Medicare annual wellness exam, Gastroesophageal reflux disease with esophagitis, Overweight, Idiopathic scoliosis, and Recurrent cystitis were also pertinent to this visit.  Xrays done in July by Dr [facility] were  Reviewed with patient today per patient request Hip and knee films failed to show any significant problems lumbar spine films showed scoliosis and disk space narrowing  Getting ESIs by anesthesiology   Wants to know if she should switch to [place],  Due to less sedation   DEXA due,  August 24th was last Prolia injection  Still  taking care  Of  two young boys 6 and 11 twice weekly after school Had mild leukocytosis in [place], , wants cbc rechecked  One week ago had a posterior vitreous detachment left eye  cataract surgery 8 months ago.  [place] checked her out  And repeat eye exam in one month.  The  floaters come and ago,  Still able to drive   Still  wearing brace in left ankle and foot by podiatry to avoid reconstructive surgery. Her foot has  improved unless she stands for long periods of time.  Using a cane on hills alone Using NuStep 3 times weekly Spend an inordinate amount of time worried about her health despite my reassurance  Sees [facility] once a year for " recurrent  UTI"  Has not had one in a year.  Concerned about Prilosec .  Tried zantac and it caused constipation . Acid reflux becam severe, sho she saw  with Vira Agar who recommended  Prliosec bid for now, x one month then daily   Constipation now resolved  Uses colace prn   History Layni has a past medical history of Arthritis; GERD (gastroesophageal reflux disease); Hyperlipidemia; Hypertension; Chronic kidney disease; and Colon polyps.   She has past surgical history that includes Tonsillectomy and adenoidectomy; Joint replacement (Right, July 2009); Appendectomy (1959); and Cholecystectomy (1995).   Her family history includes Arthritis in her mother; Diabetes in her father; Heart disease in her mother.She reports that she has never smoked. She has never used smokeless tobacco. She reports that she does not drink alcohol or use illicit drugs.  Outpatient Prescriptions Prior to Visit  Medication Sig Dispense Refill  . acetaminophen (TYLENOL) 325 MG tablet Take 650 mg by mouth 2 (two) times daily.    Marland Kitchen atenolol (TENORMIN) 25 MG tablet TAKE 1 TABLET EVERY DAY 90 tablet 1  . calcium citrate-vitamin D (CITRACAL+D) 315-200 MG-UNIT per tablet Take 2 tablets by mouth daily.    Marland Kitchen denosumab (PROLIA) 60 MG/ML SOLN injection Inject 60 mg into the skin every 6 (six) months. Administer in upper arm, thigh, or abdomen    . ipratropium (ATROVENT) 0.06 % nasal spray 1 spray each side before each meal 15 mL 12  . Multiple Vitamins-Minerals (MULTIVITAMIN WITH MINERALS) tablet Take 1 tablet by mouth daily.    Marland Kitchen omeprazole (PRILOSEC) 20 MG capsule Take 1 capsule (20 mg total) by mouth 2 (two) times daily before a meal. 60 capsule 5  . ezetimibe (ZETIA) 10 MG tablet Take 1 tablet (10 mg total) by mouth daily. 90 tablet 1  . Misc Natural Product Nasal (GELONASAL NA) Place 2 sprays into the nose 2 (two) times daily as needed.    . naproxen sodium (ANAPROX) 220 MG tablet Take 220 mg by mouth daily. PRN only    .  conjugated estrogens (PREMARIN) vaginal cream Place 1 Applicatorful vaginally at bedtime. For two weeks,  Then twice weekly therafter (Patient not taking: Reported on 12/17/2014) 42.5 g 11   No facility-administered medications prior to visit.    Review of Systems   Patient denies headache, fevers, malaise, unintentional weight loss, skin rash, eye pain, sinus congestion and sinus pain, sore throat, dysphagia,  hemoptysis , cough, dyspnea, wheezing, chest pain, palpitations, orthopnea, edema, abdominal pain, nausea, melena, diarrhea, constipation, flank pain, dysuria, hematuria, urinary  Frequency, nocturia, numbness, tingling, seizures,  Focal weakness, Loss of consciousness,  Tremor, insomnia, depression, anxiety, and suicidal ideation.      Objective:  BP 128/76 mmHg  Pulse 57  Temp(Src) 98 F (36.7 C) (Oral)  Resp 12  Ht 5\' 6"  (1.676 m)  Wt 161 lb 8 oz (73.256 kg)  BMI 26.08 kg/m2  SpO2 99%  Physical Exam  General appearance: alert, cooperative and appears stated age Ears: normal TM's and external ear canals both ears Throat: lips, mucosa, and  tongue normal; teeth and gums normal Neck: no adenopathy, no carotid bruit, supple, symmetrical, trachea midline and thyroid not enlarged, symmetric, no tenderness/mass/nodules Back: symmetric, no curvature. ROM normal. No CVA tenderness. Lungs: clear to auscultation bilaterally Heart: regular rate and rhythm, S1, S2 normal, no murmur, click, rub or gallop Abdomen: soft, non-tender; bowel sounds normal; no masses,  no organomegaly Pulses: 2+ and symmetric Skin: Skin color, texture, turgor normal. No rashes or lesions Lymph nodes: Cervical, supraclavicular, and axillary nodes normal.   Assessment & Plan:   Problem List Items Addressed This Visit    GERD (gastroesophageal reflux disease)    Agree with long term use of PPI and bid use for the next 30 days       Overweight    I have congratulated her in reduction of   BMI and  encouraged  Continued adherence to a low glycemic index diet and regular exercise a minimum of 5 days per week.        Recurrent cystitis    No UTI in one year.       Encounter for Medicare annual wellness exam    Annual Medicare wellness  exam was done as well as a comprehensive physical exam and management of acute and chronic conditions .  During the course of the visit the patient was educated and counseled about appropriate screening and preventive services including : fall prevention , diabetes screening, nutrition counseling, colorectal cancer screening, and recommended immunizations.  Printed recommendations for health maintenance screenings was given.       Idiopathic scoliosis    Resulting in low back pain.  recent orthopedics evaluation and plain films reviewed.        Other Visit Diagnoses    Other fatigue    -  Primary    Relevant Orders    CBC with Differential/Platelet    TSH       I have discontinued Ms. Attia's conjugated estrogens. I am also having her maintain her calcium citrate-vitamin D, multivitamin with minerals, Misc Natural Product Nasal (GELONASAL NA), denosumab, acetaminophen, naproxen sodium, omeprazole, ipratropium, atenolol, and ezetimibe.  Meds ordered this encounter  Medications  . ezetimibe (ZETIA) 10 MG tablet    Sig: Take 1 tablet (10 mg total) by mouth daily.    Dispense:  30 tablet    Refill:  5    Medications Discontinued During This Encounter  Medication Reason  . conjugated estrogens (PREMARIN) vaginal cream Patient Preference  . ezetimibe (ZETIA) 10 MG tablet Reorder    Follow-up: No Follow-up on file.   Crecencio Mc, MD

## 2015-06-24 NOTE — Assessment & Plan Note (Signed)
Agree with long term use of PPI and bid use for the next 30 days

## 2015-06-24 NOTE — Progress Notes (Signed)
Pre-visit discussion using our clinic review tool. No additional management support is needed unless otherwise documented below in the visit note.  

## 2015-06-24 NOTE — Assessment & Plan Note (Signed)

## 2015-06-24 NOTE — Assessment & Plan Note (Signed)
I have congratulated her in reduction of  BMI and encouraged  Continued adherence to a low glycemic index diet and regular exercise a minimum of 5 days per week.   

## 2015-06-24 NOTE — Assessment & Plan Note (Signed)
Resulting in low back pain.  recent orthopedics evaluation and plain films reviewed.

## 2015-06-26 ENCOUNTER — Encounter: Payer: Self-pay | Admitting: Internal Medicine

## 2015-08-13 ENCOUNTER — Ambulatory Visit: Payer: Medicare Other | Attending: Anesthesiology | Admitting: Anesthesiology

## 2015-08-13 ENCOUNTER — Encounter: Payer: Self-pay | Admitting: Anesthesiology

## 2015-08-13 ENCOUNTER — Other Ambulatory Visit: Payer: Self-pay | Admitting: Internal Medicine

## 2015-08-13 VITALS — BP 109/83 | HR 66 | Temp 97.6°F | Resp 16 | Ht 66.0 in | Wt 154.0 lb

## 2015-08-13 DIAGNOSIS — M4120 Other idiopathic scoliosis, site unspecified: Secondary | ICD-10-CM

## 2015-08-13 DIAGNOSIS — M48061 Spinal stenosis, lumbar region without neurogenic claudication: Secondary | ICD-10-CM | POA: Insufficient documentation

## 2015-08-13 DIAGNOSIS — M25551 Pain in right hip: Secondary | ICD-10-CM | POA: Diagnosis present

## 2015-08-13 DIAGNOSIS — M25552 Pain in left hip: Secondary | ICD-10-CM | POA: Diagnosis present

## 2015-08-13 DIAGNOSIS — M545 Low back pain, unspecified: Secondary | ICD-10-CM

## 2015-08-13 DIAGNOSIS — M419 Scoliosis, unspecified: Secondary | ICD-10-CM | POA: Diagnosis not present

## 2015-08-13 DIAGNOSIS — M4806 Spinal stenosis, lumbar region: Secondary | ICD-10-CM | POA: Insufficient documentation

## 2015-08-13 DIAGNOSIS — M5136 Other intervertebral disc degeneration, lumbar region: Secondary | ICD-10-CM | POA: Diagnosis not present

## 2015-08-13 DIAGNOSIS — M51369 Other intervertebral disc degeneration, lumbar region without mention of lumbar back pain or lower extremity pain: Secondary | ICD-10-CM

## 2015-08-13 MED ORDER — LIDOCAINE HCL (PF) 1 % IJ SOLN
INTRAMUSCULAR | Status: AC
  Administered 2015-08-13: 3 mL
  Filled 2015-08-13: qty 5

## 2015-08-13 MED ORDER — ROPIVACAINE HCL 2 MG/ML IJ SOLN
INTRAMUSCULAR | Status: AC
  Administered 2015-08-13: 1 mL
  Filled 2015-08-13: qty 10

## 2015-08-13 MED ORDER — IOHEXOL 240 MG/ML SOLN
INTRAMUSCULAR | Status: AC
  Administered 2015-08-13: 2 mL
  Filled 2015-08-13: qty 100

## 2015-08-13 MED ORDER — SODIUM CHLORIDE 0.9 % IJ SOLN
INTRAMUSCULAR | Status: AC
  Administered 2015-08-13: 5 mL
  Filled 2015-08-13: qty 20

## 2015-08-13 MED ORDER — FENTANYL CITRATE (PF) 100 MCG/2ML IJ SOLN
INTRAMUSCULAR | Status: AC
  Filled 2015-08-13: qty 2

## 2015-08-13 MED ORDER — MIDAZOLAM HCL 5 MG/5ML IJ SOLN
INTRAMUSCULAR | Status: AC
  Administered 2015-08-13: 2 mg via INTRAVENOUS
  Filled 2015-08-13: qty 5

## 2015-08-13 MED ORDER — TRIAMCINOLONE ACETONIDE 40 MG/ML IJ SUSP
INTRAMUSCULAR | Status: AC
  Administered 2015-08-13: 40 mg
  Filled 2015-08-13: qty 1

## 2015-08-13 NOTE — Patient Instructions (Signed)
Pain Management Discharge Instructions  General Discharge Instructions :  If you need to reach your doctor call: Monday-Friday 8:00 am - 4:00 pm at [phone] or toll free [phone].  After clinic hours [phone] to have operator reach doctor.  Bring all of your medication bottles to all your appointments in the pain clinic.  To cancel or reschedule your appointment with Pain Management please remember to call 24 hours in advance to avoid a fee.  Refer to the educational materials which you have been given on: General Risks, I had my Procedure. Discharge Instructions, Post Sedation.  Post Procedure Instructions:  The drugs you were given will stay in your system until tomorrow, so for the next 24 hours you should not drive, make any legal decisions or drink any alcoholic beverages.  You may eat anything you prefer, but it is better to start with liquids then soups and crackers, and gradually work up to solid foods.  Please notify your doctor immediately if you have any unusual bleeding, trouble breathing or pain that is not related to your normal pain.  Depending on the type of procedure that was done, some parts of your body may feel week and/or numb.  This usually clears up by tonight or the next day.  Walk with the use of an assistive device or accompanied by an adult for the 24 hours.  You may use ice on the affected area for the first 24 hours.  Put ice in a Ziploc bag and cover with a towel and place against area 15 minutes on 15 minutes off.  You may switch to heat after 24 hours.Epidural Steroid Injection Patient Information  Description: The epidural space surrounds the nerves as they exit the spinal cord.  In some patients, the nerves can be compressed and inflamed by a bulging disc or a tight spinal canal (spinal stenosis).  By injecting steroids into the epidural space, we can bring irritated nerves into direct contact with a potentially helpful medication.  These  steroids act directly on the irritated nerves and can reduce swelling and inflammation which often leads to decreased pain.  Epidural steroids may be injected anywhere along the spine and from the neck to the low back depending upon the location of your pain.   After numbing the skin with local anesthetic (like Novocaine), a small needle is passed into the epidural space slowly.  You may experience a sensation of pressure while this is being done.  The entire block usually last less than 10 minutes.  Conditions which may be treated by epidural steroids:   Low back and leg pain  Neck and arm pain  Spinal stenosis  Post-laminectomy syndrome  Herpes zoster (shingles) pain  Pain from compression fractures  Preparation for the injection:  1. Do not eat any solid food or dairy products within 6 hours of your appointment.  2. You may drink clear liquids up to 2 hours before appointment.  Clear liquids include water, black coffee, juice or soda.  No milk or cream please. 3. You may take your regular medication, including pain medications, with a sip of water before your appointment  Diabetics should hold regular insulin (if taken separately) and take 1/2 normal NPH dos the morning of the procedure.  Carry some sugar containing items with you to your appointment. 4. A driver must accompany you and be prepared to drive you home after your procedure.  5. Bring all your current medications with your. 6. An IV may be inserted and   sedation may be given at the discretion of the physician.   7. A blood pressure cuff, EKG and other monitors will often be applied during the procedure.  Some patients may need to have extra oxygen administered for a short period. 8. You will be asked to provide medical information, including your allergies, prior to the procedure.  We must know immediately if you are taking blood thinners (like Coumadin/Warfarin)  Or if you are allergic to IV iodine contrast (dye). We must  know if you could possible be pregnant.  Possible side-effects:  Bleeding from needle site  Infection (rare, may require surgery)  Nerve injury (rare)  Numbness & tingling (temporary)  Difficulty urinating (rare, temporary)  Spinal headache ( a headache worse with upright posture)  Light -headedness (temporary)  Pain at injection site (several days)  Decreased blood pressure (temporary)  Weakness in arm/leg (temporary)  Pressure sensation in back/neck (temporary)  Call if you experience:  Fever/chills associated with headache or increased back/neck pain.  Headache worsened by an upright position.  New onset weakness or numbness of an extremity below the injection site  Hives or difficulty breathing (go to the emergency room)  Inflammation or drainage at the infection site  Severe back/neck pain  Any new symptoms which are concerning to you  Please note:  Although the local anesthetic injected can often make your back or neck feel good for several hours after the injection, the pain will likely return.  It takes 3-7 days for steroids to work in the epidural space.  You may not notice any pain relief for at least that one week.  If effective, we will often do a series of three injections spaced 3-6 weeks apart to maximally decrease your pain.  After the initial series, we generally will wait several months before considering a repeat injection of the same type.  If you have any questions, please call [phone] [facility] 

## 2015-08-13 NOTE — Progress Notes (Signed)
Safety precautions to be maintained throughout the outpatient stay will include: orient to surroundings, keep bed in low position, maintain call bell within reach at all times, provide assistance with transfer out of bed and ambulation.  

## 2015-08-14 ENCOUNTER — Telehealth: Payer: Self-pay | Admitting: *Deleted

## 2015-08-14 NOTE — Progress Notes (Signed)
PROCEDURE PERFORMED: Lumbar epidural steroid injection under fluroscopic guidance with moderate sedation.  CC:  Centralized low back pain with radiation in the bilateral hips and buttocks  HPI:  Brenda Cardenas is a patient well-known to me he's had a long-standing history of low back pain. She has a diagnosis of spinal stenosis and scoliosis. Approximately one year ago she received a series of epidural steroid injections that alleviated the majority of her pain. She presents today for reevaluation requesting a repeat series of epidural steroid injections. Her pain has been quite considerable pain that she has severe scoliosis and severe spinal stenosis and she has recurrence of the same quality and characteristic pain that she's had documented past. No new troubles with lower extremity strength function bowel bladder function are noted at this time.  Physical Exam:    PERRL, EOMI  Heart RRR   LCTA  Musculoskeletal: She has generalized mid lumbar and lower lumbar paraspinous muscle tenderness. His peers be more profound on the left higher lumbar region approximately L2-L3 no overt trigger points are noted. She also has pain on extension with bilateral left and right rotation  Assessment:  Spinal stenosis Scoliosis Degenerative disc disease with bilateral L5-S1 radicular symptoms   PLAN:   I feel a repeat series of epidural steroid injections for her low back pain are reasonable. Gone over the risks and benefits of the procedure with her in detail all questions are answered no guarantees were made. Proceed with her first epidural injection today with return to clinic in 1 month for reevaluation and possible repeat epidural injection at this time  2.  The patient is to return for reevaluation in approximately one month. She  has been instructed to continue follow-up with their primary care physician regarding their baseline medical care.    Procedure:  LESI:  NOTE: The risks, benefits, and  expectations of the procedure have been discussed and explained to the patient who was understanding and in agreement with suggested treatment plan. No guarantees were made.  DESCRIPTION OF PROCEDURE: Lumbar epidural steroid injection with IV Versed, EKG, blood pressure, pulse, and pulse oximetry monitoring. The procedure was performed with the patient in the prone position under fluoroscopic guidance. A local anesthetic skin wheal of 1.5% plain lidocaine was performed at the appropriate site after fluoroscopic identifictation  Using strict aseptic technique, I then advanced an 18-gauge Tuohy epidural needle in the midline via loss-of-resistance  Technique. There was negative aspiration for negative aspiration for heme or  CSF. and 2 cc of Isovue yielding good epidural spread with no evidence of IV or subarachnoid uptake.  I then confirmed position with both AP and Lateral fluoroscan.  A total of 5 mL of Preservative-Free normal saline with 40 mg of Kenalog and 1cc Ropicaine 0.2 percent was injected incrementally via the  epidurally placed needle. Needle removed. The patient tolerated the injection well.   @Shantea Poulton  Andree Elk, MD@

## 2015-08-19 NOTE — Telephone Encounter (Signed)
Call back complete

## 2015-09-10 ENCOUNTER — Other Ambulatory Visit: Payer: Self-pay | Admitting: Anesthesiology

## 2015-09-10 ENCOUNTER — Ambulatory Visit: Payer: Medicare Other | Attending: Anesthesiology | Admitting: Anesthesiology

## 2015-09-10 ENCOUNTER — Encounter: Payer: Self-pay | Admitting: Anesthesiology

## 2015-09-10 VITALS — BP 140/72 | HR 58 | Temp 98.3°F | Resp 16 | Ht 66.0 in | Wt 153.0 lb

## 2015-09-10 DIAGNOSIS — M4120 Other idiopathic scoliosis, site unspecified: Secondary | ICD-10-CM | POA: Diagnosis not present

## 2015-09-10 DIAGNOSIS — M545 Low back pain, unspecified: Secondary | ICD-10-CM

## 2015-09-10 DIAGNOSIS — M48061 Spinal stenosis, lumbar region without neurogenic claudication: Secondary | ICD-10-CM

## 2015-09-10 DIAGNOSIS — M4806 Spinal stenosis, lumbar region: Secondary | ICD-10-CM | POA: Insufficient documentation

## 2015-09-10 DIAGNOSIS — M5136 Other intervertebral disc degeneration, lumbar region: Secondary | ICD-10-CM | POA: Insufficient documentation

## 2015-09-10 DIAGNOSIS — M1288 Other specific arthropathies, not elsewhere classified, other specified site: Secondary | ICD-10-CM | POA: Diagnosis not present

## 2015-09-10 MED ORDER — ROPIVACAINE HCL 2 MG/ML IJ SOLN
INTRAMUSCULAR | Status: AC
  Administered 2015-09-10: 15:00:00
  Filled 2015-09-10: qty 10

## 2015-09-10 MED ORDER — ROPIVACAINE HCL 2 MG/ML IJ SOLN
INTRAMUSCULAR | Status: AC
  Filled 2015-09-10: qty 10

## 2015-09-10 MED ORDER — DEXAMETHASONE SODIUM PHOSPHATE 4 MG/ML IJ SOLN
INTRAMUSCULAR | Status: AC
  Filled 2015-09-10: qty 1

## 2015-09-10 MED ORDER — DEXAMETHASONE SODIUM PHOSPHATE 4 MG/ML IJ SOLN
INTRAMUSCULAR | Status: AC
  Administered 2015-09-10: 15:00:00
  Filled 2015-09-10: qty 1

## 2015-09-10 NOTE — Progress Notes (Signed)
Safety precautions to be maintained throughout the outpatient stay will include: orient to surroundings, keep bed in low position, maintain call bell within reach at all times, provide assistance with transfer out of bed and ambulation.  

## 2015-09-10 NOTE — Patient Instructions (Addendum)
Epidural Steroid Injection Patient Information  Description: The epidural space surrounds the nerves as they exit the spinal cord.  In some patients, the nerves can be compressed and inflamed by a bulging disc or a tight spinal canal (spinal stenosis).  By injecting steroids into the epidural space, we can bring irritated nerves into direct contact with a potentially helpful medication.  These steroids act directly on the irritated nerves and can reduce swelling and inflammation which often leads to decreased pain.  Epidural steroids may be injected anywhere along the spine and from the neck to the low back depending upon the location of your pain.   After numbing the skin with local anesthetic (like Novocaine), a small needle is passed into the epidural space slowly.  You may experience a sensation of pressure while this is being done.  The entire block usually last less than 10 minutes.  Conditions which may be treated by epidural steroids:   Low back and leg pain  Neck and arm pain  Spinal stenosis  Post-laminectomy syndrome  Herpes zoster (shingles) pain  Pain from compression fractures  Preparation for the injection:   Do not eat any solid food or dairy products within 6 hours of your appointment.   You may drink clear liquids up to 2 hours before appointment.  Clear liquids include water, black coffee, juice or soda.  No milk or cream please.  You may take your regular medication, including pain medications, with a sip of water before your appointment  Diabetics should hold regular insulin (if taken separately) and take 1/2 normal NPH dos the morning of the procedure.  Carry some sugar containing items with you to your appointment.  A driver must accompany you and be prepared to drive you home after your procedure.   Bring all your current medications with your.  An IV may be inserted and sedation may be given at the discretion of the physician.    A blood pressure cuff, EKG  and other monitors will often be applied during the procedure.  Some patients may need to have extra oxygen administered for a short period.  You will be asked to provide medical information, including your allergies, prior to the procedure.  We must know immediately if you are taking blood thinners (like Coumadin/Warfarin)  Or if you are allergic to IV iodine contrast (dye). We must know if you could possible be pregnant.  Possible side-effects:  Bleeding from needle site  Infection (rare, may require surgery)  Nerve injury (rare)  Numbness & tingling (temporary)  Difficulty urinating (rare, temporary)  Spinal headache ( a headache worse with upright posture)  Light -headedness (temporary)  Pain at injection site (several days)  Decreased blood pressure (temporary)  Weakness in arm/leg (temporary)  Pressure sensation in back/neck (temporary)  Call if you experience:  Fever/chills associated with headache or increased back/neck pain.  Headache worsened by an upright position.  New onset weakness or numbness of an extremity below the injection site  Hives or difficulty breathing (go to the emergency room)  Inflammation or drainage at the infection site  Severe back/neck pain  Any new symptoms which are concerning to you  Please note:  Although the local anesthetic injected can often make your back or neck feel good for several hours after the injection, the pain will likely return.  It takes 3-7 days for steroids to work in the epidural space.  You may not notice any pain relief for at least that one week.  If effective, we will often do a series of three injections spaced 3-6 weeks apart to maximally decrease your pain.  After the initial series, we generally will wait several months before considering a repeat injection of the same type.  If you have any questions, please call [phone] [facility]  Injection Trigger points are areas where you have muscle pain. A trigger point injection is a shot given in the trigger point to relieve that pain. A trigger point might feel like a knot in your muscle. It hurts to press on a trigger point. Sometimes the pain spreads out (radiates) to other parts of the body. For example, pressing on a trigger point in your shoulder might cause pain in your arm or neck. You might have one trigger point. Or, you might have more than one. People often have trigger points in their upper back and lower back. They also occur often in the neck and shoulders. Pain from a trigger point lasts for a long time. It can make it hard to keep moving. You might not be able to do the exercise or physical therapy that could help you deal with the pain. A trigger point injection may help. It does not work for everyone. But, it may relieve your pain for a few days or a few months. A trigger point injection does not cure long-lasting (chronic) pain. LET YOUR CAREGIVER KNOW ABOUT:  Any allergies (especially to latex, lidocaine, or steroids).  Blood-thinning medicines that you take. These drugs can lead to bleeding or bruising after an injection. They include:  Aspirin.  Ibuprofen.  Clopidogrel.  Warfarin.  Other medicines you take. This includes all vitamins, herbs, eyedrops, over-the-counter medicines, and creams.  Use of steroids.  Recent infections.  Past problems with numbing medicines.  Bleeding problems.  Surgeries you have had.  Other health problems. RISKS AND COMPLICATIONS A trigger point injection is a safe treatment. However, problems may develop, such as:  Minor side effects usually go away in 1 to 2 days. These may include:  Soreness.  Bruising.  Stiffness.  More serious problems are rare. But, they may include:  Bleeding under the skin (hematoma).  Skin infection.  Breaking off of the needle under your skin.  Lung puncture.  The trigger  point injection may not work for you. BEFORE THE PROCEDURE You may need to stop taking any medicine that thins your blood. This is to prevent bleeding and bruising. Usually these medicines are stopped several days before the injection. No other preparation is needed. PROCEDURE  A trigger point injection can be given in your caregiver's office or in a clinic. Each injection takes 2 minutes or less.  Your caregiver will feel for trigger points. The caregiver may use a marker to circle the area for the injection.  The skin over the trigger point will be washed with a germ-killing (antiseptic) solution.  The caregiver pinches the spot for the injection.  Then, a very thin needle is used for the shot. You may feel pain or a twitching feeling when the needle enters the trigger point.  A numbing solution may be injected into the trigger point. Sometimes a drug to keep down swelling, redness, and warmth (inflammation) is also injected.  Your caregiver moves the needle around the trigger zone until the tightness and twitching goes away.  After the injection, your caregiver may put gentle pressure over the injection site.  Then it is covered with a bandage. AFTER THE  PROCEDURE  You can go right home after the injection.  The bandage can be taken off after a few hours.  You may feel sore and stiff for 1 to 2 days.  Go back to your regular activities slowly. Your caregiver may ask you to stretch your muscles. Do not do anything that takes extra energy for a few days.  Follow your caregiver's instructions to manage and treat other pain.   This information is not intended to replace advice given to you by your health care provider. Make sure you discuss any questions you have with your health care provider.   Document Released: 09/03/2011 Document Revised: 01/09/2013 Document Reviewed: 09/03/2011 Elsevier Interactive Patient Education [employer].

## 2015-09-11 NOTE — Progress Notes (Signed)
PROCEDURE PERFORMED: L2 and right SI trigger point injection  CC:  Centralized low back pain at L2 and right posterior low back pain   HPI:  Brenda Cardenas is a patient well-known to me he's had a long-standing history of low back pain. She has a diagnosis of spinal stenosis and scoliosis. Approximately one year ago she received a series of epidural steroid injections that alleviated the majority of her pain. She presents today for reevaluation status post an epidural steroid at her last visit. She states that she is having some mid lumbar back pain and pain over the right posterior HIP region she did well with her last epidural injection she continues to do her physical therapy exercises as tolerated and uses Tylenol sparingly for breakthrough pain.Marland Kitchen  Physical Exam:    PERRL, EOMI  Heart RRR   LCTA  Musculoskeletal: She has generalized mid lumbar and lower lumbar paraspinous muscle tenderness. She has a trigger point in the midline approximately L2 and overlying the right SI joint. She has limited range of motion at extension below back worsened with right lateral rotation as compared with left lateral rotation Assessment:  Myofascial pain syndrome with trigger points evident on today's examination  Spinal stenosis Scoliosis Degenerative disc disease with bilateral L5-S1 radicular symptoms Facet arthropathy   PLAN:   I will plan on a trigger point injection today to the areas previously mentioned.  She was returned clinic approximately 2 months for reevaluation. At that point we will plan on a repeat epidural injection. Ultimately, she may be candidate for a facet injection as well as there are facet genic characteristics to her pain syndrome. Furthermore she is to continue with her current stretching strengthening exercise protocol that she seems to be performing regularly.   Procedure:  Trigger point injection: The areas overlying the previously mentioned trigger points were prepped  with alcohol and then injected with a 25-gauge needle. At a dose of 8 cc of ropivacaine 0.2% mixed with 4 mg of Decadron after negative aspiration and this was well-tolerated she was then convalesced discharged home stable condition for follow-up as mentioned   @James  Andree Elk, MD@

## 2015-09-14 LAB — TOXASSURE SELECT 13 (MW), URINE: PDF: 0

## 2015-10-06 ENCOUNTER — Encounter: Payer: Self-pay | Admitting: Internal Medicine

## 2015-10-08 ENCOUNTER — Telehealth: Payer: Self-pay | Admitting: Internal Medicine

## 2015-10-08 NOTE — Telephone Encounter (Signed)
I have electronically submitted pt's info for [employer] insurance verification and will notify you once I have a response. Thank you. °

## 2015-10-14 NOTE — Telephone Encounter (Signed)
I have rec'd pt's insurance verification for [employer].  [employer] is requiring a prior auth, however, there is one on file which is still effective, [id number]; effective 04/30/2015-04/29/2016.  Brenda Cardenas estimated responsibility is $40 whether an OV is billed or not.  Please make pt aware this is an estimate and we will not know and exact amt until insurance(s) has/have paid.  I have sent a copy of the summary of benefits to be scanned into pt's chart.  Once pt recs injection, please let me know actual injection date so I can update the [employer] portal.  If you have any questions, please let me know.

## 2015-10-14 NOTE — Telephone Encounter (Signed)
Patient will be due for the injection on February 24th.  Spoke with the patient, Scheduled for 8am to receive injection.

## 2015-10-20 ENCOUNTER — Encounter: Payer: Self-pay | Admitting: Internal Medicine

## 2015-11-04 ENCOUNTER — Telehealth: Payer: Self-pay | Admitting: Internal Medicine

## 2015-11-04 DIAGNOSIS — E785 Hyperlipidemia, unspecified: Secondary | ICD-10-CM

## 2015-11-04 DIAGNOSIS — R5383 Other fatigue: Secondary | ICD-10-CM

## 2015-11-04 DIAGNOSIS — Z0000 Encounter for general adult medical examination without abnormal findings: Secondary | ICD-10-CM

## 2015-11-04 DIAGNOSIS — E559 Vitamin D deficiency, unspecified: Secondary | ICD-10-CM

## 2015-11-04 NOTE — Telephone Encounter (Signed)
The patient called to set up her 6 month follow up . She also requested to have labs done prior to her 3.27.17 office visit . Needing lab orders in the system before scheduling .

## 2015-11-05 NOTE — Telephone Encounter (Signed)
I have pended the usual labs anything else and what DX?

## 2015-11-05 NOTE — Telephone Encounter (Signed)
Labs in can schedule

## 2015-11-05 NOTE — Telephone Encounter (Signed)
You can use fatigue,  Other; hyperlipidemia, ,  Vitamin D deficiency,  And long term use of high risk med

## 2015-11-13 ENCOUNTER — Ambulatory Visit: Payer: Medicare Other | Attending: Anesthesiology | Admitting: Anesthesiology

## 2015-11-13 ENCOUNTER — Encounter: Payer: Self-pay | Admitting: Anesthesiology

## 2015-11-13 VITALS — BP 120/55 | HR 63 | Temp 97.8°F | Resp 16 | Ht 66.0 in | Wt 154.0 lb

## 2015-11-13 DIAGNOSIS — M5137 Other intervertebral disc degeneration, lumbosacral region: Secondary | ICD-10-CM | POA: Insufficient documentation

## 2015-11-13 DIAGNOSIS — M545 Low back pain, unspecified: Secondary | ICD-10-CM

## 2015-11-13 DIAGNOSIS — M4120 Other idiopathic scoliosis, site unspecified: Secondary | ICD-10-CM | POA: Diagnosis not present

## 2015-11-13 DIAGNOSIS — M5417 Radiculopathy, lumbosacral region: Secondary | ICD-10-CM | POA: Insufficient documentation

## 2015-11-13 DIAGNOSIS — M4806 Spinal stenosis, lumbar region: Secondary | ICD-10-CM | POA: Diagnosis not present

## 2015-11-13 DIAGNOSIS — M419 Scoliosis, unspecified: Secondary | ICD-10-CM | POA: Insufficient documentation

## 2015-11-13 DIAGNOSIS — M4800 Spinal stenosis, site unspecified: Secondary | ICD-10-CM | POA: Insufficient documentation

## 2015-11-13 DIAGNOSIS — M5136 Other intervertebral disc degeneration, lumbar region: Secondary | ICD-10-CM

## 2015-11-13 DIAGNOSIS — M791 Myalgia: Secondary | ICD-10-CM | POA: Diagnosis not present

## 2015-11-13 DIAGNOSIS — M48061 Spinal stenosis, lumbar region without neurogenic claudication: Secondary | ICD-10-CM

## 2015-11-13 DIAGNOSIS — M129 Arthropathy, unspecified: Secondary | ICD-10-CM | POA: Diagnosis not present

## 2015-11-13 NOTE — Patient Instructions (Signed)
GENERAL RISKS AND COMPLICATIONS  What are the risk, side effects and possible complications? Generally speaking, most procedures are safe.  However, with any procedure there are risks, side effects, and the possibility of complications.  The risks and complications are dependent upon the sites that are lesioned, or the type of nerve block to be performed.  The closer the procedure is to the spine, the more serious the risks are.  Great care is taken when placing the radio frequency needles, block needles or lesioning probes, but sometimes complications can occur. 1. Infection: Any time there is an injection through the skin, there is a risk of infection.  This is why sterile conditions are used for these blocks.  There are four possible types of infection. 1. Localized skin infection. 2. Central Nervous System Infection-This can be in the form of Meningitis, which can be deadly. 3. Epidural Infections-This can be in the form of an epidural abscess, which can cause pressure inside of the spine, causing compression of the spinal cord with subsequent paralysis. This would require an emergency surgery to decompress, and there are no guarantees that the patient would recover from the paralysis. 4. Discitis-This is an infection of the intervertebral discs.  It occurs in about 1% of discography procedures.  It is difficult to treat and it may lead to surgery.        2. Pain: the needles have to go through skin and soft tissues, will cause soreness.       3. Damage to internal structures:  The nerves to be lesioned may be near blood vessels or    other nerves which can be potentially damaged.       4. Bleeding: Bleeding is more common if the patient is taking blood thinners such as  aspirin, Coumadin, Ticiid, Plavix, etc., or if he/she have some genetic predisposition  such as hemophilia. Bleeding into the spinal canal can cause compression of the spinal  cord with subsequent paralysis.  This would require an  emergency surgery to  decompress and there are no guarantees that the patient would recover from the  paralysis.       5. Pneumothorax:  Puncturing of a lung is a possibility, every time a needle is introduced in  the area of the chest or upper back.  Pneumothorax refers to free air around the  collapsed lung(s), inside of the thoracic cavity (chest cavity).  Another two possible  complications related to a similar event would include: Hemothorax and Chylothorax.   These are variations of the Pneumothorax, where instead of air around the collapsed  lung(s), you may have blood or chyle, respectively.       6. Spinal headaches: They may occur with any procedures in the area of the spine.       7. Persistent CSF (Cerebro-Spinal Fluid) leakage: This is a rare problem, but may occur  with prolonged intrathecal or epidural catheters either due to the formation of a fistulous  track or a dural tear.       8. Nerve damage: By working so close to the spinal cord, there is always a possibility of  nerve damage, which could be as serious as a permanent spinal cord injury with  paralysis.       9. Death:  Although rare, severe deadly allergic reactions known as "Anaphylactic  reaction" can occur to any of the medications used.      10. Worsening of the symptoms:  We can always make thing worse.    What are the chances of something like this happening? Chances of any of this occuring are extremely low.  By statistics, you have more of a chance of getting killed in a motor vehicle accident: while driving to the hospital than any of the above occurring .  Nevertheless, you should be aware that they are possibilities.  In general, it is similar to taking a shower.  Everybody knows that you can slip, hit your head and get killed.  Does that mean that you should not shower again?  Nevertheless always keep in mind that statistics do not mean anything if you happen to be on the wrong side of them.  Even if a procedure has a 1  (one) in a 1,000,000 (million) chance of going wrong, it you happen to be that one..Also, keep in mind that by statistics, you have more of a chance of having something go wrong when taking medications.  Who should not have this procedure? If you are on a blood thinning medication (e.g. Coumadin, Plavix, see list of "Blood Thinners"), or if you have an active infection going on, you should not have the procedure.  If you are taking any blood thinners, please inform your physician.  How should I prepare for this procedure?  Do not eat or drink anything at least six hours prior to the procedure.  Bring a driver with you .  It cannot be a taxi.  Come accompanied by an adult that can drive you back, and that is strong enough to help you if your legs get weak or numb from the local anesthetic.  Take all of your medicines the morning of the procedure with just enough water to swallow them.  If you have diabetes, make sure that you are scheduled to have your procedure done first thing in the morning, whenever possible.  If you have diabetes, take only half of your insulin dose and notify our nurse that you have done so as soon as you arrive at the clinic.  If you are diabetic, but only take blood sugar pills (oral hypoglycemic), then do not take them on the morning of your procedure.  You may take them after you have had the procedure.  Do not take aspirin or any aspirin-containing medications, at least eleven (11) days prior to the procedure.  They may prolong bleeding.  Wear loose fitting clothing that may be easy to take off and that you would not mind if it got stained with Betadine or blood.  Do not wear any jewelry or perfume  Remove any nail coloring.  It will interfere with some of our monitoring equipment.  NOTE: Remember that this is not meant to be interpreted as a complete list of all possible complications.  Unforeseen problems may occur.  BLOOD THINNERS The following drugs  contain aspirin or other products, which can cause increased bleeding during surgery and should not be taken for 2 weeks prior to and 1 week after surgery.  If you should need take something for relief of minor pain, you may take acetaminophen which is found in Tylenol,m Datril, Anacin-3 and Panadol. It is not blood thinner. The products listed below are.  Do not take any of the products listed below in addition to any listed on your instruction sheet.  A.P.C or A.P.C with Codeine Codeine Phosphate Capsules #3 Ibuprofen Ridaura  ABC compound Congesprin Imuran rimadil  Advil Cope Indocin Robaxisal  Alka-Seltzer Effervescent Pain Reliever and Antacid Coricidin or Coricidin-D  Indomethacin Rufen    Alka-Seltzer plus Cold Medicine Cosprin Ketoprofen S-A-C Tablets  Anacin Analgesic Tablets or Capsules Coumadin Korlgesic Salflex  Anacin Extra Strength Analgesic tablets or capsules CP-2 Tablets Lanoril Salicylate  Anaprox Cuprimine Capsules Levenox Salocol  Anexsia-D Dalteparin Magan Salsalate  Anodynos Darvon compound Magnesium Salicylate Sine-off  Ansaid Dasin Capsules Magsal Sodium Salicylate  Anturane Depen Capsules Marnal Soma  APF Arthritis pain formula Dewitt's Pills Measurin Stanback  Argesic Dia-Gesic Meclofenamic Sulfinpyrazone  Arthritis Bayer Timed Release Aspirin Diclofenac Meclomen Sulindac  Arthritis pain formula Anacin Dicumarol Medipren Supac  Analgesic (Safety coated) Arthralgen Diffunasal Mefanamic Suprofen  Arthritis Strength Bufferin Dihydrocodeine Mepro Compound Suprol  Arthropan liquid Dopirydamole Methcarbomol with Aspirin Synalgos  ASA tablets/Enseals Disalcid Micrainin Tagament  Ascriptin Doan's Midol Talwin  Ascriptin A/D Dolene Mobidin Tanderil  Ascriptin Extra Strength Dolobid Moblgesic Ticlid  Ascriptin with Codeine Doloprin or Doloprin with Codeine Momentum Tolectin  Asperbuf Duoprin Mono-gesic Trendar  Aspergum Duradyne Motrin or Motrin IB Triminicin  Aspirin  plain, buffered or enteric coated Durasal Myochrisine Trigesic  Aspirin Suppositories Easprin Nalfon Trillsate  Aspirin with Codeine Ecotrin Regular or Extra Strength Naprosyn Uracel  Atromid-S Efficin Naproxen Ursinus  Auranofin Capsules Elmiron Neocylate Vanquish  Axotal Emagrin Norgesic Verin  Azathioprine Empirin or Empirin with Codeine Normiflo Vitamin E  Azolid Emprazil Nuprin Voltaren  Bayer Aspirin plain, buffered or children's or timed BC Tablets or powders Encaprin Orgaran Warfarin Sodium  Buff-a-Comp Enoxaparin Orudis Zorpin  Buff-a-Comp with Codeine Equegesic Os-Cal-Gesic   Buffaprin Excedrin plain, buffered or Extra Strength Oxalid   Bufferin Arthritis Strength Feldene Oxphenbutazone   Bufferin plain or Extra Strength Feldene Capsules Oxycodone with Aspirin   Bufferin with Codeine Fenoprofen Fenoprofen Pabalate or Pabalate-SF   Buffets II Flogesic Panagesic   Buffinol plain or Extra Strength Florinal or Florinal with Codeine Panwarfarin   Buf-Tabs Flurbiprofen Penicillamine   Butalbital Compound Four-way cold tablets Penicillin   Butazolidin Fragmin Pepto-Bismol   Carbenicillin Geminisyn Percodan   Carna Arthritis Reliever Geopen Persantine   Carprofen Gold's salt Persistin   Chloramphenicol Goody's Phenylbutazone   Chloromycetin Haltrain Piroxlcam   Clmetidine heparin Plaquenil   Cllnoril Hyco-pap Ponstel   Clofibrate Hydroxy chloroquine Propoxyphen         Before stopping any of these medications, be sure to consult the physician who ordered them.  Some, such as Coumadin (Warfarin) are ordered to prevent or treat serious conditions such as "deep thrombosis", "pumonary embolisms", and other heart problems.  The amount of time that you may need off of the medication may also vary with the medication and the reason for which you were taking it.  If you are taking any of these medications, please make sure you notify your pain physician before you undergo any  procedures.         Pain Management Discharge Instructions  General Discharge Instructions :  If you need to reach your doctor call: Monday-Friday 8:00 am - 4:00 pm at [phone] or toll free [phone].  After clinic hours [phone] to have operator reach doctor.  Bring all of your medication bottles to all your appointments in the pain clinic.  To cancel or reschedule your appointment with Pain Management please remember to call 24 hours in advance to avoid a fee.  Refer to the educational materials which you have been given on: General Risks, I had my Procedure. Discharge Instructions, Post Sedation.  Post Procedure Instructions:  The drugs you were given will stay in your system until tomorrow, so for the next 24 hours you should   not drive, make any legal decisions or drink any alcoholic beverages.  You may eat anything you prefer, but it is better to start with liquids then soups and crackers, and gradually work up to solid foods.  Please notify your doctor immediately if you have any unusual bleeding, trouble breathing or pain that is not related to your normal pain.  Depending on the type of procedure that was done, some parts of your body may feel week and/or numb.  This usually clears up by tonight or the next day.  Walk with the use of an assistive device or accompanied by an adult for the 24 hours.  You may use ice on the affected area for the first 24 hours.  Put ice in a Ziploc bag and cover with a towel and place against area 15 minutes on 15 minutes off.  You may switch to heat after 24 hours.Epidural Steroid Injection Patient Information  Description: The epidural space surrounds the nerves as they exit the spinal cord.  In some patients, the nerves can be compressed and inflamed by a bulging disc or a tight spinal canal (spinal stenosis).  By injecting steroids into the epidural space, we can bring irritated nerves into direct contact with a potentially  helpful medication.  These steroids act directly on the irritated nerves and can reduce swelling and inflammation which often leads to decreased pain.  Epidural steroids may be injected anywhere along the spine and from the neck to the low back depending upon the location of your pain.   After numbing the skin with local anesthetic (like Novocaine), a small needle is passed into the epidural space slowly.  You may experience a sensation of pressure while this is being done.  The entire block usually last less than 10 minutes.  Conditions which may be treated by epidural steroids:   Low back and leg pain  Neck and arm pain  Spinal stenosis  Post-laminectomy syndrome  Herpes zoster (shingles) pain  Pain from compression fractures  Preparation for the injection:  1. Do not eat any solid food or dairy products within 6 hours of your appointment.  2. You may drink clear liquids up to 2 hours before appointment.  Clear liquids include water, black coffee, juice or soda.  No milk or cream please. 3. You may take your regular medication, including pain medications, with a sip of water before your appointment  Diabetics should hold regular insulin (if taken separately) and take 1/2 normal NPH dos the morning of the procedure.  Carry some sugar containing items with you to your appointment. 4. A driver must accompany you and be prepared to drive you home after your procedure.  5. Bring all your current medications with your. 6. An IV may be inserted and sedation may be given at the discretion of the physician.   7. A blood pressure cuff, EKG and other monitors will often be applied during the procedure.  Some patients may need to have extra oxygen administered for a short period. 8. You will be asked to provide medical information, including your allergies, prior to the procedure.  We must know immediately if you are taking blood thinners (like Coumadin/Warfarin)  Or if you are allergic to IV iodine  contrast (dye). We must know if you could possible be pregnant.  Possible side-effects:  Bleeding from needle site  Infection (rare, may require surgery)  Nerve injury (rare)  Numbness & tingling (temporary)  Difficulty urinating (rare, temporary)  Spinal headache (  a headache worse with upright posture)  Light -headedness (temporary)  Pain at injection site (several days)  Decreased blood pressure (temporary)  Weakness in arm/leg (temporary)  Pressure sensation in back/neck (temporary)  Call if you experience:  Fever/chills associated with headache or increased back/neck pain.  Headache worsened by an upright position.  New onset weakness or numbness of an extremity below the injection site  Hives or difficulty breathing (go to the emergency room)  Inflammation or drainage at the infection site  Severe back/neck pain  Any new symptoms which are concerning to you  Please note:  Although the local anesthetic injected can often make your back or neck feel good for several hours after the injection, the pain will likely return.  It takes 3-7 days for steroids to work in the epidural space.  You may not notice any pain relief for at least that one week.  If effective, we will often do a series of three injections spaced 3-6 weeks apart to maximally decrease your pain.  After the initial series, we generally will wait several months before considering a repeat injection of the same type.  If you have any questions, please call [phone] [facility]

## 2015-11-13 NOTE — Progress Notes (Signed)
Safety precautions to be maintained throughout the outpatient stay will include: orient to surroundings, keep bed in low position, maintain call bell within reach at all times, provide assistance with transfer out of bed and ambulation.  

## 2015-11-15 NOTE — Telephone Encounter (Signed)
Patient has been scheduled

## 2015-11-15 NOTE — Progress Notes (Signed)
   CC:  Centralized low back pain at L2 and right posterior low back pain   HPI:  Mr. Brenda Cardenas presents for reevaluation today. She was last seen in clinic in December at which point she had trigger point injection times to the right lumbar and sacral region. She states that she received significant relief with that to the point where she is been doing very well. She is sleeping better at night able to stand for longer and then ambulating better. Otherwise no change in her lower extremity strength to puncture noted this time.   Physical Exam:    PERRL, EOMI  Heart RRR   LCTA  Musculoskeletal: She is less tender in the right paraspinous lumbar region with less significant tenderness. She has good ambulation with an antalgic gait but she is more mobile than on previous evaluations and appears to be more comfortable.  Assessment:   1. Myofascial pain syndrome with trigger points evident on today's examination   2. Spinal stenosis   3. Scoliosis  . 4. Degenerative disc disease with bilateral L5-S1 radicular symptoms   5. Facet arthropathy   PLAN:   We will have her return to clinic in approximately 1 month for reevaluation. I'll defer on any repeat trigger point injections today and have her continue with her back stretch and strengthening exercises. She appears to be making good progress.  @James  Andree Elk, MD@

## 2015-11-22 ENCOUNTER — Ambulatory Visit (INDEPENDENT_AMBULATORY_CARE_PROVIDER_SITE_OTHER): Payer: Medicare Other

## 2015-11-22 DIAGNOSIS — M810 Age-related osteoporosis without current pathological fracture: Secondary | ICD-10-CM

## 2015-11-22 MED ORDER — DENOSUMAB 60 MG/ML ~~LOC~~ SOLN
60.0000 mg | Freq: Once | SUBCUTANEOUS | Status: AC
  Administered 2015-11-22: 60 mg via SUBCUTANEOUS

## 2015-11-22 NOTE — Progress Notes (Signed)
Patient came in for Prolia injection.  Received in right Arm.  Patient tolerated well.

## 2015-11-28 ENCOUNTER — Ambulatory Visit (INDEPENDENT_AMBULATORY_CARE_PROVIDER_SITE_OTHER): Payer: Medicare Other | Admitting: Podiatry

## 2015-11-28 ENCOUNTER — Encounter: Payer: Self-pay | Admitting: Podiatry

## 2015-11-28 DIAGNOSIS — M19072 Primary osteoarthritis, left ankle and foot: Secondary | ICD-10-CM | POA: Diagnosis not present

## 2015-11-28 DIAGNOSIS — M722 Plantar fascial fibromatosis: Secondary | ICD-10-CM | POA: Diagnosis not present

## 2015-11-28 DIAGNOSIS — G5752 Tarsal tunnel syndrome, left lower limb: Secondary | ICD-10-CM | POA: Diagnosis not present

## 2015-11-28 DIAGNOSIS — L84 Corns and callosities: Secondary | ICD-10-CM

## 2015-11-28 DIAGNOSIS — M25572 Pain in left ankle and joints of left foot: Secondary | ICD-10-CM

## 2015-11-28 DIAGNOSIS — M6789 Other specified disorders of synovium and tendon, multiple sites: Secondary | ICD-10-CM

## 2015-11-28 DIAGNOSIS — M76829 Posterior tibial tendinitis, unspecified leg: Secondary | ICD-10-CM

## 2015-11-28 DIAGNOSIS — M779 Enthesopathy, unspecified: Secondary | ICD-10-CM

## 2015-12-03 NOTE — Progress Notes (Signed)
Patient ID: Brenda Cardenas, female   DOB: [birth year], 82 y.o.   MRN: [id number]  Subjective: Brenda Cardenas  presents today for follow-up evaluation of left foot and ankle pain. She states that she was doing well the last injection until about one week ago. She is requesting steroid injections to her left foot against today. She is also requesting a new ankle brace as the brace that she has worn out. She has tried [place] brace other custom brace without any relief and should to continue with the Tri-Lock ankle brace as this gives her the most relief. No other complaints at this time in no acute changes.   Objective: AAO 3, NAD Neurovascular status unchanged. There is tenderness palpation along the plantar aspect of the left calcaneus at the insertion the plantar fascia. There is no pain with lateral compression of the calcaneus or with vibratory sensation. No pain on the posterior aspect of the calcaneus. There is no pain on the course of plantar fascial in the arch of the foot.  There is a significant decrease in medial arch height upon weightbearing with medial prominences. Hammertoes are present There is continued pain on the lateral aspect of the foot overlying the sinus tarsi. There is discomfort with subtalar joint range of motion and is restricted. There is no crepitation.  There are no other areas of pinpoint bony tenderness or pain with vibratory sensation on the foot or ankle. There is no overlying edema or any erythema or increase in warmth. Pre-ulcerative callus to the left distal second toe due to underlying hammertoe. Upon debridement no underlying ulceration, drainage or other signs of infection. No open lesions or other pre-ulcer lesions identified No pain with calf compression, swelling, warmth, erythema. Evaluation of the brace reveals normal wear patterns the bottom of the brace.  Assessment:  82 year old female with sinus tarsi pain, heel pain; due to significant  flatfoot.  Plan: -Treatment options discussed including all alternatives, risks, and complications -Patient elects to proceed with steroid injection into the left heel. Under sterile skin preparation, a total of 2.5cc of kenalog 10, 0.5% Marcaine plain, and 2% lidocaine plain were infiltrated into the symptomatic area without complication. A band-aid was applied. Patient tolerated the injection well without complication. Post-injection care with discussed with the patient. Discussed with the patient to ice the area over the next couple of days to help prevent a steroid flare.  -She also is requesting steroid injection in the sinus tarsi. I discussed risks, occasions for which she understands verbally consents. Under sterile conditions a total of 1 mL of a mixture of Kenalog 10, 2% lidocaine plain was infiltrated into the lateral aspect of the foot over the area of maximal tenderness on the sinus tarsi. She tolerated injection well without any complications. Post injection care was discussed. -Dispensed new Tri-Lock ankle brace per her request. I discussed with her new areas and the brace should be hinged however she does not want to pursue a further brace. -Hyperkerotic lesions debrided without complications or bleeding.  -Continue stretching exercises. Ice to the area as needed. -Follow-up as needed. In the meantime encouraged to call the office with any questions, concerns, change in symptoms.  Celesta Gentile, DPM

## 2015-12-04 ENCOUNTER — Telehealth: Payer: Self-pay | Admitting: Internal Medicine

## 2015-12-04 DIAGNOSIS — M810 Age-related osteoporosis without current pathological fracture: Secondary | ICD-10-CM

## 2015-12-04 NOTE — Telephone Encounter (Signed)
Her bone density has improved..  Continue  Prolia.  Next dose due in late August 2017.   Rose,  please reorder or get PA if needed .  Thank you

## 2015-12-04 NOTE — Assessment & Plan Note (Signed)
Managed for years.  First alendronate, followed by Evista,  Now with Prolia for the last 1-2 years.  DExa shows improvement in BMD from 2014

## 2015-12-16 ENCOUNTER — Other Ambulatory Visit (INDEPENDENT_AMBULATORY_CARE_PROVIDER_SITE_OTHER): Payer: Medicare Other

## 2015-12-16 DIAGNOSIS — E559 Vitamin D deficiency, unspecified: Secondary | ICD-10-CM

## 2015-12-16 DIAGNOSIS — R5383 Other fatigue: Secondary | ICD-10-CM

## 2015-12-16 DIAGNOSIS — E785 Hyperlipidemia, unspecified: Secondary | ICD-10-CM | POA: Diagnosis not present

## 2015-12-16 LAB — COMPREHENSIVE METABOLIC PANEL
ALBUMIN: 4 g/dL (ref 3.5–5.2)
ALK PHOS: 75 U/L (ref 39–117)
ALT: 18 U/L (ref 0–35)
AST: 21 U/L (ref 0–37)
BILIRUBIN TOTAL: 0.6 mg/dL (ref 0.2–1.2)
BUN: 25 mg/dL — ABNORMAL HIGH (ref 6–23)
CALCIUM: 9.2 mg/dL (ref 8.4–10.5)
CO2: 29 mEq/L (ref 19–32)
Chloride: 104 mEq/L (ref 96–112)
Creatinine, Ser: 0.73 mg/dL (ref 0.40–1.20)
GFR: 81.12 mL/min (ref >60.00)
GLUCOSE: 97 mg/dL (ref 70–99)
POTASSIUM: 4.4 meq/L (ref 3.5–5.1)
Sodium: 139 mEq/L (ref 135–145)
TOTAL PROTEIN: 6.5 g/dL (ref 6.0–8.3)

## 2015-12-16 LAB — LIPID PANEL
CHOLESTEROL: 185 mg/dL (ref 0–200)
HDL: 55.2 mg/dL (ref >39.00)
LDL Cholesterol: 107 mg/dL — ABNORMAL HIGH (ref 0–99)
NonHDL: 130.02
TRIGLYCERIDES: 115 mg/dL (ref 0.0–149.0)
Total CHOL/HDL Ratio: 3
VLDL: 23 mg/dL (ref 0.0–40.0)

## 2015-12-16 LAB — CBC WITH DIFFERENTIAL/PLATELET
BASOS PCT: 0.7 % (ref 0.0–3.0)
Basophils Absolute: 0.1 10*3/uL (ref 0.0–0.1)
EOS ABS: 0.3 10*3/uL (ref 0.0–0.7)
Eosinophils Relative: 3.9 % (ref 0.0–5.0)
HEMATOCRIT: 40.8 % (ref 36.0–46.0)
Hemoglobin: 13.4 g/dL (ref 12.0–15.0)
LYMPHS ABS: 2 10*3/uL (ref 0.7–4.0)
Lymphocytes Relative: 24 % (ref 12.0–46.0)
MCHC: 32.9 g/dL (ref 30.0–36.0)
MCV: 95.9 fl (ref 78.0–100.0)
MONO ABS: 0.5 10*3/uL (ref 0.1–1.0)
Monocytes Relative: 5.8 % (ref 3.0–12.0)
NEUTROS ABS: 5.6 10*3/uL (ref 1.4–7.7)
NEUTROS PCT: 65.6 % (ref 43.0–77.0)
Platelets: 173 10*3/uL (ref 150.0–400.0)
RBC: 4.26 Mil/uL (ref 3.87–5.11)
RDW: 16.5 % — ABNORMAL HIGH (ref 11.5–15.5)
WBC: 8.5 10*3/uL (ref 4.0–10.5)

## 2015-12-16 LAB — TSH: TSH: 1.16 u[IU]/mL (ref 0.35–4.50)

## 2015-12-16 LAB — VITAMIN D 25 HYDROXY (VIT D DEFICIENCY, FRACTURES): VITD: 37.51 ng/mL (ref 30.00–100.00)

## 2015-12-17 ENCOUNTER — Encounter: Payer: Self-pay | Admitting: Internal Medicine

## 2015-12-23 ENCOUNTER — Encounter: Payer: Self-pay | Admitting: Internal Medicine

## 2015-12-23 ENCOUNTER — Ambulatory Visit (INDEPENDENT_AMBULATORY_CARE_PROVIDER_SITE_OTHER): Payer: Medicare Other | Admitting: Internal Medicine

## 2015-12-23 ENCOUNTER — Ambulatory Visit: Payer: Medicare Other | Admitting: Internal Medicine

## 2015-12-23 VITALS — BP 108/70 | HR 56 | Temp 97.5°F | Resp 12 | Ht 66.0 in | Wt 155.0 lb

## 2015-12-23 DIAGNOSIS — M47812 Spondylosis without myelopathy or radiculopathy, cervical region: Secondary | ICD-10-CM

## 2015-12-23 DIAGNOSIS — Z86010 Personal history of colonic polyps: Secondary | ICD-10-CM

## 2015-12-23 DIAGNOSIS — I10 Essential (primary) hypertension: Secondary | ICD-10-CM

## 2015-12-23 DIAGNOSIS — E663 Overweight: Secondary | ICD-10-CM | POA: Diagnosis not present

## 2015-12-23 DIAGNOSIS — K2100 Gastro-esophageal reflux disease with esophagitis, without bleeding: Secondary | ICD-10-CM

## 2015-12-23 DIAGNOSIS — R0982 Postnasal drip: Secondary | ICD-10-CM

## 2015-12-23 DIAGNOSIS — G4762 Sleep related leg cramps: Secondary | ICD-10-CM

## 2015-12-23 DIAGNOSIS — M810 Age-related osteoporosis without current pathological fracture: Secondary | ICD-10-CM | POA: Diagnosis not present

## 2015-12-23 MED ORDER — IPRATROPIUM BROMIDE 0.06 % NA SOLN: NASAL | Status: DC

## 2015-12-23 NOTE — Patient Instructions (Addendum)
Your constipation may be due from the atenolol or  From the Zetia  Since your BP is so well controlled, you can suspend the atenolol and follow your blood pressure  And pulse  If stopping the atenolol helps the dry mouth , we can always use a different blood pressure medication if we needt o resume so,ething  A good BP is 130/80 or less.  A normal pulse is 85 or less

## 2015-12-23 NOTE — Progress Notes (Signed)
Pre-visit discussion using our clinic review tool. No additional management support is needed unless otherwise documented below in the visit note.  

## 2015-12-23 NOTE — Telephone Encounter (Signed)
Yes in chart under imaging printed out in yellow folder.

## 2015-12-23 NOTE — Progress Notes (Signed)
Subjective:  Patient ID: Brenda Cardenas, female    DOB: [birth year]  Age: 82 y.o. MRN: [id number]  CC: The primary encounter diagnosis was Gastroesophageal reflux disease with esophagitis. Diagnoses of Post-nasal drip, Overweight, Osteoporosis, Essential hypertension, benign, Osteoarthritis of cervical spine, History of colonic polyps, and Nocturnal leg cramps were also pertinent to this visit.  HPI Brenda Cardenas presents for follow up of chronic conditions including hyperlipidemia on Zetia, osteoporosis managed with Prolia injections,  Hypertension , colonic polyps. And GERD . Last seen for medicare wellness in September.    Patient is taking her medications as prescribed and notes no adverse effects.  Home BP readings have been done about once per week and are  generally < 130/80 .  She is avoiding added salt in her diet and walking regularly about 3 times per week for exercise  .  The benefits of continued screening for colon CA were discussed.  Her last colonoscopy  Was done in 2014, no polyps.  She has a history of polyps,  No family histor y of  colon CA.    Left ankle problems chronic discussed.  She has a  fallen arch and restricted ROM .  Wears an ankle brace to provide support after spending   $400  On e prescribed by podiatry which she regards as  "useless" .  No surgery plans due to age and relative discomfort not outweighing the risks of surgery   Having frequent nocturnal leg cramps,,  Uses tonic water.  Occurs when she stretches out her leg..  Discussed other nonpharamcologic was to prevent cramps. Reviewed last potassium level.    Uses hot shower and arnicare cream.   Constipation still problematic but managed with fdiet.   Reviewed DEXA done March 2017 .   Hypertension:  BP lower since losing weight.  Discussed suspending atenolol for a trial period .      Outpatient Prescriptions Prior to Visit  Medication Sig Dispense Refill  . acetaminophen (TYLENOL) 325 MG tablet Take  650 mg by mouth 2 (two) times daily. May take up to four  Per day/ 2 in the morning and 2 in the afternoon    . atenolol (TENORMIN) 25 MG tablet TAKE 1 TABLET EVERY DAY 90 tablet 2  . calcium citrate-vitamin D (CITRACAL+D) 315-200 MG-UNIT per tablet Take 2 tablets by mouth daily.    Marland Kitchen denosumab (PROLIA) 60 MG/ML SOLN injection Inject 60 mg into the skin every 6 (six) months. Administer in upper arm, thigh, or abdomen    . ezetimibe (ZETIA) 10 MG tablet Take 1 tablet (10 mg total) by mouth daily. 30 tablet 5  . Misc Natural Product Nasal (GELONASAL NA) Place 2 sprays into the nose 2 (two) times daily as needed.    . Multiple Vitamins-Minerals (MULTIVITAMIN WITH MINERALS) tablet Take 1 tablet by mouth daily.    . naproxen sodium (ANAPROX) 220 MG tablet Take 220 mg by mouth daily. Reported on 11/13/2015    . omeprazole (PRILOSEC) 20 MG capsule Take 1 capsule (20 mg total) by mouth 2 (two) times daily before a meal. 60 capsule 5  . Probiotic Product (Hecker) CAPS Take 1 capsule by mouth daily.    Marland Kitchen ipratropium (ATROVENT) 0.06 % nasal spray 1 spray each side before each meal (Patient taking differently: as needed. 1 spray each side before each meal) 15 mL 12   No facility-administered medications prior to visit.    Review of Systems;  Patient denies headache, fevers, malaise, unintentional  weight loss, skin rash, eye pain, sinus congestion and sinus pain, sore throat, dysphagia,  hemoptysis , cough, dyspnea, wheezing, chest pain, palpitations, orthopnea, edema, abdominal pain, nausea, melena, diarrhea, constipation, flank pain, dysuria, hematuria, urinary  Frequency, nocturia, numbness, tingling, seizures,  Focal weakness, Loss of consciousness,  Tremor, insomnia, depression, anxiety, and suicidal ideation.      Objective:  BP 108/70 mmHg  Pulse 56  Temp(Src) 97.5 F (36.4 C) (Oral)  Resp 12  Ht 5\' 6"  (1.676 m)  Wt 155 lb (70.308 kg)  BMI 25.03 kg/m2  SpO2 98%  BP Readings  from Last 3 Encounters:  12/23/15 108/70  11/13/15 120/55  09/10/15 140/72    Wt Readings from Last 3 Encounters:  12/23/15 155 lb (70.308 kg)  11/13/15 154 lb (69.854 kg)  09/10/15 153 lb (69.4 kg)    General appearance: alert, cooperative and appears stated age Lungs: clear to auscultation bilaterally Heart: regular rate and rhythm, S1, S2 normal, no murmur, click, rub or gallop Abdomen: soft, non-tender; bowel sounds normal; no masses,  no organomegaly Pulses: 2+ and symmetric Skin: Skin color, texture, turgor normal. No rashes or lesions Lymph nodes: Cervical, supraclavicular, and axillary nodes normal. MSK: no calf tenderness , swelling or bruising. Left ankle restricted inversion/eversion.   No results found for: HGBA1C  Lab Results  Component Value Date   CREATININE 0.73 12/16/2015   CREATININE 0.72 06/17/2015   CREATININE 0.69 12/10/2014    Lab Results  Component Value Date   WBC 8.5 12/16/2015   HGB 13.4 12/16/2015   HCT 40.8 12/16/2015   PLT 173.0 12/16/2015   GLUCOSE 97 12/16/2015   CHOL 185 12/16/2015   TRIG 115.0 12/16/2015   HDL 55.20 12/16/2015   LDLCALC 107* 12/16/2015   ALT 18 12/16/2015   AST 21 12/16/2015   NA 139 12/16/2015   K 4.4 12/16/2015   CL 104 12/16/2015   CREATININE 0.73 12/16/2015   BUN 25* 12/16/2015   CO2 29 12/16/2015   TSH 1.16 12/16/2015    No results found.  Assessment & Plan:   Problem List Items Addressed This Visit    GERD (gastroesophageal reflux disease) - Primary    Agree with long term use of PPI given daily use of naproxen .       Post-nasal drip    Managed with prn atrovent nasal spray       Overweight    I have congratulated her in reduction of   BMI and encouraged  Continued adherence to  a low glycemic index diet and regular exercise a minimum of 5 days per week.        Osteoporosis    DEXA reviewed with patient today.  Reviewed use of calcium and Vit D supplements.  Continue Prolia.  Next  injection due in late August.       Osteoarthritis of cervical spine    Managed with naproxen and tylenol.       Essential hypertension, benign    Treated with atenolol for years. Given weight loss and improved SBP, advised to suspend med for one week as a trial.       History of colonic polyps    Benefits of continued screening with cologuard discussed.  Given her age and lack of FH of colon ca, advised her to dc continued screening       Nocturnal leg cramps    Secondary to muscle fatigue caused by restricted ROM of left ankle.  Discussed ways to prevent  A total of 40 minutes was spent with patient more than half of which was spent in counseling patient on the above mentioned issues , reviewing and explaining recent labs and imaging studies done, and coordination of care. I am having Ms. Achille maintain her calcium citrate-vitamin D, multivitamin with minerals, Misc Natural Product Nasal (GELONASAL NA), denosumab, acetaminophen, naproxen sodium, omeprazole, ezetimibe, atenolol, PHILLIPS COLON HEALTH, and ipratropium.  Meds ordered this encounter  Medications  . ipratropium (ATROVENT) 0.06 % nasal spray    Sig: 1 spray each side before each meal    Dispense:  15 mL    Refill:  12    Medications Discontinued During This Encounter  Medication Reason  . ipratropium (ATROVENT) 0.06 % nasal spray Reorder    Follow-up: Return in about 6 months (around 06/24/2016).   Crecencio Mc, MD

## 2015-12-24 DIAGNOSIS — Z86010 Personal history of colonic polyps: Secondary | ICD-10-CM | POA: Insufficient documentation

## 2015-12-24 DIAGNOSIS — I10 Essential (primary) hypertension: Secondary | ICD-10-CM | POA: Insufficient documentation

## 2015-12-24 DIAGNOSIS — G4762 Sleep related leg cramps: Secondary | ICD-10-CM | POA: Insufficient documentation

## 2015-12-24 NOTE — Assessment & Plan Note (Signed)
Managed with prn atrovent nasal spray

## 2015-12-24 NOTE — Assessment & Plan Note (Addendum)
Agree with long term use of PPI given daily use of naproxen .

## 2015-12-24 NOTE — Assessment & Plan Note (Signed)
Treated with atenolol for years. Given weight loss and improved SBP, advised to suspend med for one week as a trial.

## 2015-12-24 NOTE — Assessment & Plan Note (Signed)
Managed with naproxen and tylenol.

## 2015-12-24 NOTE — Assessment & Plan Note (Signed)
I have congratulated her in reduction of  BMI and encouraged  Continued adherence to a low glycemic index diet and regular exercise a minimum of 5 days per week.   

## 2015-12-24 NOTE — Assessment & Plan Note (Addendum)
Secondary to muscle fatigue caused by restricted ROM of left ankle.  Discussed ways to prevent

## 2015-12-24 NOTE — Assessment & Plan Note (Signed)
Benefits of continued screening with cologuard discussed.  Given her age and lack of FH of colon ca, advised her to dc continued screening

## 2015-12-24 NOTE — Assessment & Plan Note (Signed)
DEXA reviewed with patient today.  Reviewed use of calcium and Vit D supplements.  Continue Prolia.  Next injection due in late August.

## 2016-01-16 ENCOUNTER — Ambulatory Visit: Payer: Medicare Other | Attending: Anesthesiology | Admitting: Anesthesiology

## 2016-01-16 ENCOUNTER — Encounter: Payer: Self-pay | Admitting: Anesthesiology

## 2016-01-16 VITALS — BP 144/86 | HR 59 | Temp 97.7°F | Resp 16 | Ht 66.0 in | Wt 156.0 lb

## 2016-01-16 DIAGNOSIS — M1288 Other specific arthropathies, not elsewhere classified, other specified site: Secondary | ICD-10-CM | POA: Insufficient documentation

## 2016-01-16 DIAGNOSIS — M791 Myalgia: Secondary | ICD-10-CM | POA: Insufficient documentation

## 2016-01-16 DIAGNOSIS — M4806 Spinal stenosis, lumbar region: Secondary | ICD-10-CM | POA: Diagnosis not present

## 2016-01-16 DIAGNOSIS — M4120 Other idiopathic scoliosis, site unspecified: Secondary | ICD-10-CM

## 2016-01-16 DIAGNOSIS — M545 Low back pain, unspecified: Secondary | ICD-10-CM

## 2016-01-16 DIAGNOSIS — M419 Scoliosis, unspecified: Secondary | ICD-10-CM | POA: Insufficient documentation

## 2016-01-16 DIAGNOSIS — M5136 Other intervertebral disc degeneration, lumbar region: Secondary | ICD-10-CM | POA: Insufficient documentation

## 2016-01-16 NOTE — Progress Notes (Signed)
Patient here for evaluation and f/up.  Does not need meds.

## 2016-01-17 NOTE — Progress Notes (Signed)
   CC:  Centralized low back pain at L2 and right posterior low back pain   HPI:  Brenda Cardenas presents for reevaluation today. She was last seen in clinic in December at which point she had trigger point injection times to the right lumbar and sacral region. She states that she received significant relief with that to the point where she is been doing very well. She is sleeping better at night able to stand for longer and then ambulating better. Otherwise no change in her lower extremity strength to puncture noted this time. At this point she is having almost no pain in her low back other than with prolonged standing or vacuuming but generally this is well tolerated  Physical Exam:    PERRL, EOMI  Heart RRR   LCTA  Musculoskeletal: She is less tender in the right paraspinous lumbar region with less significant tenderness. She has good ambulation with an antalgic gait but she is more mobile than on previous evaluations and appears to be more comfortable.  Assessment:   1. Myofascial pain syndrome with trigger points evident on today's examination   2. Spinal stenosis   3. Scoliosis  . 4. Degenerative disc disease with bilateral L5-S1 radicular symptoms   5. Facet arthropathy   PLAN:   We will have her return to clinic in approximately 3 month for reevaluation. I'll defer on any repeat trigger point injections today and have her continue with her back stretch and strengthening exercises. She appears to be making good progress.  @Brenda Cardenas  Andree Elk, MD@

## 2016-01-19 ENCOUNTER — Encounter: Payer: Self-pay | Admitting: *Deleted

## 2016-01-19 ENCOUNTER — Emergency Department
Admission: EM | Admit: 2016-01-19 | Discharge: 2016-01-19 | Disposition: A | Discharge status: Home / Self Care | Payer: Medicare Other | Attending: Emergency Medicine | Admitting: Emergency Medicine

## 2016-01-19 DIAGNOSIS — M4120 Other idiopathic scoliosis, site unspecified: Secondary | ICD-10-CM | POA: Diagnosis not present

## 2016-01-19 DIAGNOSIS — Y929 Unspecified place or not applicable: Secondary | ICD-10-CM | POA: Insufficient documentation

## 2016-01-19 DIAGNOSIS — M810 Age-related osteoporosis without current pathological fracture: Secondary | ICD-10-CM | POA: Diagnosis not present

## 2016-01-19 DIAGNOSIS — Y999 Unspecified external cause status: Secondary | ICD-10-CM | POA: Diagnosis not present

## 2016-01-19 DIAGNOSIS — Z791 Long term (current) use of non-steroidal anti-inflammatories (NSAID): Secondary | ICD-10-CM | POA: Insufficient documentation

## 2016-01-19 DIAGNOSIS — Z96659 Presence of unspecified artificial knee joint: Secondary | ICD-10-CM | POA: Insufficient documentation

## 2016-01-19 DIAGNOSIS — Y9389 Activity, other specified: Secondary | ICD-10-CM | POA: Insufficient documentation

## 2016-01-19 DIAGNOSIS — S91115A Laceration without foreign body of left lesser toe(s) without damage to nail, initial encounter: Secondary | ICD-10-CM | POA: Diagnosis present

## 2016-01-19 DIAGNOSIS — M1990 Unspecified osteoarthritis, unspecified site: Secondary | ICD-10-CM | POA: Insufficient documentation

## 2016-01-19 DIAGNOSIS — Z79899 Other long term (current) drug therapy: Secondary | ICD-10-CM | POA: Insufficient documentation

## 2016-01-19 DIAGNOSIS — W268XXA Contact with other sharp object(s), not elsewhere classified, initial encounter: Secondary | ICD-10-CM | POA: Diagnosis not present

## 2016-01-19 DIAGNOSIS — M4806 Spinal stenosis, lumbar region: Secondary | ICD-10-CM | POA: Insufficient documentation

## 2016-01-19 DIAGNOSIS — I129 Hypertensive chronic kidney disease with stage 1 through stage 4 chronic kidney disease, or unspecified chronic kidney disease: Secondary | ICD-10-CM | POA: Insufficient documentation

## 2016-01-19 DIAGNOSIS — E785 Hyperlipidemia, unspecified: Secondary | ICD-10-CM | POA: Diagnosis not present

## 2016-01-19 DIAGNOSIS — N189 Chronic kidney disease, unspecified: Secondary | ICD-10-CM | POA: Insufficient documentation

## 2016-01-19 DIAGNOSIS — M5136 Other intervertebral disc degeneration, lumbar region: Secondary | ICD-10-CM | POA: Insufficient documentation

## 2016-01-19 NOTE — ED Notes (Signed)
Pt presents after cutting L 4th toe, unable to stop bleeding. Pt denies taking any form of blood thinners.

## 2016-01-19 NOTE — ED Notes (Signed)
provider at bedside

## 2016-01-19 NOTE — ED Provider Notes (Signed)
[facility] Emergency Department Provider Note  ____________________________________________  Time seen: Approximately 11:03 PM  I have reviewed the triage vital signs and the nursing notes.   HISTORY  Chief Complaint Toe Injury   HPI Brenda Cardenas is a 82 y.o. female is here with laceration to her left fourth toe.Patient states that she was trimming a "worn off the top of her left fourth toe when it started bleeding. She states this happened about 9:30 PM tonight and was unable to get the area to stop bleeding. Patient denies any pain at this time. She denies being on any blood thinners.   Past Medical History  Diagnosis Date  . Arthritis   . GERD (gastroesophageal reflux disease)   . Hyperlipidemia   . Hypertension   . Chronic kidney disease   . Colon polyps     Patient Active Problem List   Diagnosis Date Noted  . Essential hypertension, benign 12/24/2015  . History of colonic polyps 12/24/2015  . Nocturnal leg cramps 12/24/2015  . DDD (degenerative disc disease), lumbar 08/13/2015  . Right-sided low back pain without sciatica 08/13/2015  . Spinal stenosis of lumbar region 08/13/2015  . Idiopathic scoliosis 06/24/2015  . Rhinitis due food 12/17/2014  . Osteoarthritis of cervical spine 02/25/2014  . Tibialis posterior tendonitis 12/26/2013  . Osteoporosis 06/18/2013  . Encounter for Medicare annual wellness exam 06/18/2013  . Hiatal hernia 06/16/2013  . GERD (gastroesophageal reflux disease) 05/02/2013  . Rectal bleeding 05/02/2013  . S/P total knee replacement 05/02/2013  . Post-nasal drip 05/02/2013  . Overweight 05/02/2013  . Recurrent cystitis 05/02/2013    Past Surgical History  Procedure Laterality Date  . Tonsillectomy and adenoidectomy    . Joint replacement Right July 2009    [facility]   . Appendectomy  1959  . Cholecystectomy  1995    Current Outpatient Rx  Name  Route  Sig  Dispense  Refill  . acetaminophen (TYLENOL) 325  MG tablet   Oral   Take 650 mg by mouth 2 (two) times daily. May take up to four  Per day/ 2 in the morning and 2 in the afternoon         . atenolol (TENORMIN) 25 MG tablet      TAKE 1 TABLET EVERY DAY   90 tablet   2   . calcium citrate-vitamin D (CITRACAL+D) 315-200 MG-UNIT per tablet   Oral   Take 2 tablets by mouth daily.         Marland Kitchen denosumab (PROLIA) 60 MG/ML SOLN injection   Subcutaneous   Inject 60 mg into the skin every 6 (six) months. Administer in upper arm, thigh, or abdomen         . ezetimibe (ZETIA) 10 MG tablet   Oral   Take 1 tablet (10 mg total) by mouth daily.   30 tablet   5   . ipratropium (ATROVENT) 0.06 % nasal spray      1 spray each side before each meal   15 mL   12   . Misc Natural Product Nasal (GELONASAL NA)   Nasal   Place 2 sprays into the nose 2 (two) times daily as needed. Reported on 01/16/2016         . Multiple Vitamins-Minerals (MULTIVITAMIN WITH MINERALS) tablet   Oral   Take 1 tablet by mouth daily.         . naproxen sodium (ANAPROX) 220 MG tablet   Oral   Take 220 mg  by mouth daily. Reported on 01/16/2016         . omeprazole (PRILOSEC) 20 MG capsule   Oral   Take 1 capsule (20 mg total) by mouth 2 (two) times daily before a meal. Patient taking differently: Take 20 mg by mouth daily.    60 capsule   5   . Probiotic Product (PHILLIPS COLON HEALTH) CAPS   Oral   Take 1 capsule by mouth daily.           Allergies Sulfa antibiotics; Augmentin; Naproxen sodium; Tramadol; Acetaminophen; Codeine; Doxycycline; Doxycycline hyclate; E-mycin; Lipitor; Macrobid; Nitrofurantoin monohyd macro; Nsaids; Phenobarbital; Relafen; Statins; Sulfacetamide sodium; Sulfasalazine; and Tramadol hcl  Family History  Problem Relation Age of Onset  . Heart disease Mother   . Arthritis Mother   . Diabetes Father     Social History Social History  Substance Use Topics  . Smoking status: Never Smoker   . Smokeless tobacco:  Never Used  . Alcohol Use: No    Review of Systems Constitutional: No fever/chills Cardiovascular: Denies chest pain. Respiratory: Denies shortness of breath. Gastrointestinal:   No nausea, no vomiting.   Musculoskeletal: Injury left fourth toe. Skin: Skin avulsion left fourth toe.   10-point ROS otherwise negative.  ____________________________________________   PHYSICAL EXAM:  VITAL SIGNS: ED Triage Vitals  Enc Vitals Group     BP 01/19/16 2244 190/85 mmHg     Pulse Rate 01/19/16 2244 61     Resp --      Temp 01/19/16 2244 97.6 F (36.4 C)     Temp Source 01/19/16 2244 Oral     SpO2 01/19/16 2244 96 %     Weight 01/19/16 2244 156 lb (70.761 kg)     Height 01/19/16 2244 5\' 6"  (1.676 m)     Head Cir --      Peak Flow --      Pain Score 01/19/16 2249 0     Pain Loc --      Pain Edu? --      Excl. in [place]? --     Constitutional: Alert and oriented. Well appearing and in no acute distress. Eyes: Conjunctivae are normal. PERRL. EOMI. Head: Atraumatic. Nose: No congestion/rhinnorhea. Neck: No stridor.   Cardiovascular: Normal rate, regular rhythm. Grossly normal heart sounds.  Good peripheral circulation. Respiratory: Normal respiratory effort.  No retractions. Lungs CTAB. Musculoskeletal: No lower extremity tenderness nor edema.  No joint effusions. Neurologic:  Normal speech and language. No gross focal neurologic deficits are appreciated. No gait instability. Skin:  Skin is warm, dry. Dorsum of the left fourth toe there is a pinpoint area of skin avulsion that appears to have bled but no active bleeding at present. Area appears to be very superficial. Psychiatric: Mood and affect are normal. Speech and behavior are normal.  ____________________________________________   LABS (all labs ordered are listed, but only abnormal results are displayed)  Labs Reviewed - No data to display    PROCEDURES  Procedure(s) performed: None  Critical Care performed:  No  ____________________________________________   INITIAL IMPRESSION / ASSESSMENT AND PLAN / ED COURSE  Pertinent labs & imaging results that were available during my care of the patient were reviewed by me and considered in my medical decision making (see chart for details).  Surgical dressing was placed on patient's left fourth toe even though currently it is not bleeding. Patient was encouraged to keep it clean and dry at this point and leave the dressing on for 2  days if at all possible. No other problems are anticipated. ____________________________________________   FINAL CLINICAL IMPRESSION(S) / ED DIAGNOSES  Final diagnoses:  Laceration of fourth toe, left, initial encounter      Johnn Hai, PA-C 01/19/16 2316  Carrie Mew, MD 01/20/16 0021

## 2016-01-19 NOTE — Discharge Instructions (Signed)
Nonsutured Laceration Care °A laceration is a cut that goes through all layers of the skin and extends into the tissue that is right under the skin. This type of cut is usually stitched up (sutured) or closed with tape (adhesive strips) or skin glue shortly after the injury happens. °However, if the wound is dirty or if several hours pass before medical treatment is provided, it is likely that germs (bacteria) will enter the wound. Closing a laceration after bacteria have entered it increases the risk of infection. In these cases, your health care provider may leave the laceration open (nonsutured) and cover it with a bandage. This type of treatment helps prevent infection and allows the wound to heal from the deepest layer of tissue damage up to the surface. °An open fracture is a type of injury that may involve nonsutured lacerations. An open fracture is a break in a bone that happens along with one or more lacerations through the skin that is near the fracture site. °HOW TO CARE FOR YOUR NONSUTURED LACERATION °· Take or apply over-the-counter and prescription medicines only as told by your health care provider. °· If you were prescribed an antibiotic medicine, take or apply it as told by your health care provider. Do not stop using the antibiotic even if your condition improves. °· Clean the wound one time each day or as told by your health care provider. °¨ Wash the wound with mild soap and water. °¨ Rinse the wound with water to remove all soap. °¨ Pat your wound dry with a clean towel. Do not rub the wound. °· Do not inject anything into the wound unless your health care provider told you to. °· Change any bandages (dressings) as told by your health care provider. This includes changing the dressing if it gets wet, dirty, or starts to smell bad. °· Keep the dressing dry until your health care provider says it can be removed. Do not take baths, swim, or do anything that puts your wound underwater until your  health care provider approves. °· Raise (elevate) the injured area above the level of your heart while you are sitting or lying down, if possible. °· Do not scratch or pick at the wound. °· Check your wound every day for signs of infection. Watch for: °¨ Redness, swelling, or pain. °¨ Fluid, blood, or pus. °· Keep all follow-up visits as told by your health care provider. This is important. °SEEK MEDICAL CARE IF: °· You received a tetanus and shot and you have swelling, severe pain, redness, or bleeding at the injection site.   °· You have a fever. °· Your pain is not controlled with medicine. °· You have increased redness, swelling, or pain at the site of your wound. °· You have fluid, blood, or pus coming from your wound. °· You notice a bad smell coming from your wound or your dressing. °· You notice something coming out of the wound, such as wood or glass. °· You notice a change in the color of your skin near your wound. °· You develop a new rash. °· You need to change the dressing frequently due to fluid, blood, or pus draining from the wound. °· You develop numbness around your wound. °SEEK IMMEDIATE MEDICAL CARE IF: °· Your pain suddenly increases and is severe. °· You develop severe swelling around the wound. °· The wound is on your hand or foot and you cannot properly move a finger or toe. °· The wound is on your hand or   foot and you notice that your fingers or toes look pale or bluish.  You have a red streak going away from your wound.   This information is not intended to replace advice given to you by your health care provider. Make sure you discuss any questions you have with your health care provider.   Document Released: 08/12/2006 Document Revised: 01/29/2015 Document Reviewed: 09/10/2014 Elsevier Interactive Patient Education 2016 [place] Leave dressing on for the next 2 days. Follow-up with Dr. Derrel Nip if any continued problems.

## 2016-01-19 NOTE — ED Notes (Signed)
Patient states that she cut a corn off the top of her left fourth toe and that it started bleeding. Patient reports that this happened around 21:30 and has not been able to stop the bleeding. Pressure dressing applied.

## 2016-01-23 ENCOUNTER — Ambulatory Visit (INDEPENDENT_AMBULATORY_CARE_PROVIDER_SITE_OTHER): Payer: Medicare Other | Admitting: Podiatry

## 2016-01-23 ENCOUNTER — Encounter: Payer: Self-pay | Admitting: Podiatry

## 2016-01-23 DIAGNOSIS — L84 Corns and callosities: Secondary | ICD-10-CM | POA: Diagnosis not present

## 2016-01-23 DIAGNOSIS — M2042 Other hammer toe(s) (acquired), left foot: Secondary | ICD-10-CM

## 2016-01-23 DIAGNOSIS — B351 Tinea unguium: Secondary | ICD-10-CM

## 2016-01-23 NOTE — Progress Notes (Signed)
Patient ID: Brenda Cardenas, female   DOB: 1931/10/23, 82 y.o.   MRN: [id number]  Subjective: 82 year old female presents the office today for concerns of her left second toe corn and toenail. She states that her toe is contracted and she has pain in two quarters the tip of her toe. She is also states that when the nail to be trimmed. She does not want her other nails trimmed. She was trimming a corn on the top of her left fourth toe stated emergency room if she cut her toe and caused bleeding. Denies any systemic complaints such as fevers, chills, nausea, vomiting. No acute changes since last appointment, and no other complaints at this time.   Objective: AAO x3, NAD DP/PT pulses palpable bilaterally, CRT less than 3 seconds There is a small annular ulceration present the dorsal aspect the left fourth PIPJ. This is granular. There is no probing, undermining or tunneling. There is no swelling erythema, ascending cellulitis, fluctuance, crepitus, malodor, drainage or pus. Hyperkeratotic lesion distal aspect left second toe with a hypertrophic toenail. There is tenderness overlying this area. No areas of pinpoint bony tenderness or pain with vibratory sensation. MMT 5/5, ROM WNL. Hammertoes present.  No edema, erythema, increase in warmth to bilateral lower extremities.  No open lesions or pre-ulcerative lesions.  No pain with calf compression, swelling, warmth, erythema  Assessment: Left second toe hyperkeratotic lesion and onychomycosis of 2nd toe;  left fourth toe ulceration  Plan: -All treatment options discussed with the patient including all alternatives, risks, complications.  -Hyperkeratotic lesion nail debridement the left second toe at her request. Upon debridement there was resolution of symptoms. She does not with the other calluses and nails trimmed at this time. -Recommended and buttock ointment to the wound and left fourth toe. It does not heal then once she was to call the office.  Monitor for infection. -Patient encouraged to call the office with any questions, concerns, change in symptoms.   Celesta Gentile, DPM

## 2016-01-30 ENCOUNTER — Ambulatory Visit: Payer: Medicare Other | Admitting: Podiatry

## 2016-02-17 NOTE — Telephone Encounter (Signed)
Please advise thanks.

## 2016-02-18 LAB — HM MAMMOGRAPHY

## 2016-02-26 ENCOUNTER — Encounter: Payer: Self-pay | Admitting: Internal Medicine

## 2016-03-04 ENCOUNTER — Encounter: Payer: Self-pay | Admitting: Internal Medicine

## 2016-03-14 ENCOUNTER — Other Ambulatory Visit: Payer: Self-pay | Admitting: Internal Medicine

## 2016-03-25 ENCOUNTER — Telehealth: Payer: Self-pay | Admitting: *Deleted

## 2016-03-25 DIAGNOSIS — R300 Dysuria: Secondary | ICD-10-CM

## 2016-03-25 NOTE — Telephone Encounter (Signed)
Patient stated that she has a hx of UTi, she would like to drop a sample off of her [facility] was reminded that she may have to be seen for the Uti. Pt contact [phone]

## 2016-03-25 NOTE — Telephone Encounter (Signed)
ues if the POCT UA is abnormal I will call in an antibiotic

## 2016-03-25 NOTE — Telephone Encounter (Signed)
Patient could not come in and leave urine today have scheduled for tomorrow , patient refuses to see anyone but MD no appointment available. Patient has had burning on urination X 2 days, patient is going out of town tommorow afternoon and does not want to get worse while out of town, hope ok I have entered UA culture and POCT.

## 2016-03-25 NOTE — Telephone Encounter (Signed)
Patient called to be seen by Dr.Tullo only for her UTi,in the morning.She currently has burning during urination.

## 2016-03-25 NOTE — Telephone Encounter (Signed)
Could this be a 1130 tomorrow?

## 2016-03-26 ENCOUNTER — Other Ambulatory Visit (INDEPENDENT_AMBULATORY_CARE_PROVIDER_SITE_OTHER): Payer: Medicare Other

## 2016-03-26 DIAGNOSIS — R300 Dysuria: Secondary | ICD-10-CM

## 2016-03-26 LAB — POCT URINALYSIS DIPSTICK
Bilirubin, UA: NEGATIVE
Glucose, UA: NEGATIVE
Ketones, UA: NEGATIVE
Nitrite, UA: POSITIVE
PH UA: 5.5
PROTEIN UA: NEGATIVE
SPEC GRAV UA: 1.015
UROBILINOGEN UA: 0.2

## 2016-03-26 MED ORDER — CIPROFLOXACIN HCL 250 MG PO TABS: 250.0000 mg | ORAL_TABLET | Freq: Two times a day (BID) | ORAL | Status: DC

## 2016-03-26 NOTE — Telephone Encounter (Signed)
Patient tnoified and patient also aware of probiotic therapy recommended by MD.

## 2016-03-26 NOTE — Telephone Encounter (Signed)
POCT abnormal

## 2016-03-26 NOTE — Telephone Encounter (Signed)
5 day course of Cipro sent to [facility].   If culture results show need to change antibiotics we will do so and notify her.

## 2016-03-28 LAB — URINE CULTURE

## 2016-03-29 ENCOUNTER — Encounter: Payer: Self-pay | Admitting: Internal Medicine

## 2016-03-29 ENCOUNTER — Other Ambulatory Visit: Payer: Self-pay | Admitting: Internal Medicine

## 2016-03-29 MED ORDER — FOSFOMYCIN TROMETHAMINE 3 G PO PACK: 3.0000 g | PACK | Freq: Once | ORAL | Status: DC

## 2016-03-30 ENCOUNTER — Encounter: Payer: Self-pay | Admitting: Internal Medicine

## 2016-03-30 NOTE — Telephone Encounter (Signed)
Patient has received the medication.

## 2016-03-30 NOTE — Progress Notes (Signed)
Confirmed patient has received medication for uti.

## 2016-04-04 ENCOUNTER — Encounter: Payer: Self-pay | Admitting: Internal Medicine

## 2016-04-06 ENCOUNTER — Telehealth: Payer: Self-pay | Admitting: Internal Medicine

## 2016-04-06 NOTE — Telephone Encounter (Signed)
I have electronically submitted pt's info for [employer] insurance verification and will notify you once I have a response. Thank you. °

## 2016-04-06 NOTE — Telephone Encounter (Signed)
Noted, thanks!

## 2016-04-18 ENCOUNTER — Encounter: Payer: Self-pay | Admitting: Internal Medicine

## 2016-04-20 NOTE — Telephone Encounter (Signed)
I have rec'd Ms. Ambrosini insurance verification for Prolia and her Lorella Nimrod is requiring a prior authorization.  I am faxing the the p/a form to you at [phone].  Please have Dr. Derrel Nip to answer the questions on page 1 and sign page 2.  Once complete please return via fax to me at [phone].  Thank you!

## 2016-04-21 NOTE — Telephone Encounter (Signed)
Form was faxed back to your attention. thanks

## 2016-04-22 ENCOUNTER — Ambulatory Visit: Payer: Medicare Other | Attending: Anesthesiology | Admitting: Anesthesiology

## 2016-04-22 ENCOUNTER — Encounter: Payer: Self-pay | Admitting: Anesthesiology

## 2016-04-22 VITALS — BP 111/63 | HR 47 | Temp 97.9°F | Resp 18 | Ht 66.0 in | Wt 156.0 lb

## 2016-04-22 DIAGNOSIS — M419 Scoliosis, unspecified: Secondary | ICD-10-CM | POA: Insufficient documentation

## 2016-04-22 DIAGNOSIS — M4800 Spinal stenosis, site unspecified: Secondary | ICD-10-CM | POA: Insufficient documentation

## 2016-04-22 DIAGNOSIS — M5136 Other intervertebral disc degeneration, lumbar region: Secondary | ICD-10-CM

## 2016-04-22 DIAGNOSIS — M1288 Other specific arthropathies, not elsewhere classified, other specified site: Secondary | ICD-10-CM | POA: Insufficient documentation

## 2016-04-22 DIAGNOSIS — M545 Low back pain, unspecified: Secondary | ICD-10-CM

## 2016-04-22 DIAGNOSIS — M48061 Spinal stenosis, lumbar region without neurogenic claudication: Secondary | ICD-10-CM

## 2016-04-22 DIAGNOSIS — M791 Myalgia: Secondary | ICD-10-CM | POA: Diagnosis not present

## 2016-04-22 DIAGNOSIS — M5137 Other intervertebral disc degeneration, lumbosacral region: Secondary | ICD-10-CM | POA: Insufficient documentation

## 2016-04-22 MED ORDER — ROPIVACAINE HCL 2 MG/ML IJ SOLN
INTRAMUSCULAR | Status: AC
  Administered 2016-04-22: 15:00:00
  Filled 2016-04-22: qty 10

## 2016-04-22 MED ORDER — DEXAMETHASONE SODIUM PHOSPHATE 10 MG/ML IJ SOLN
INTRAMUSCULAR | Status: AC
  Administered 2016-04-22: 15:00:00
  Filled 2016-04-22: qty 1

## 2016-04-22 NOTE — Patient Instructions (Signed)
Trigger Point Injection Trigger points are areas where you have muscle pain. A trigger point injection is a shot given in the trigger point to relieve that pain. A trigger point might feel like a knot in your muscle. It hurts to press on a trigger point. Sometimes the pain spreads out (radiates) to other parts of the body. For example, pressing on a trigger point in your shoulder might cause pain in your arm or neck. You might have one trigger point. Or, you might have more than one. People often have trigger points in their upper back and lower back. They also occur often in the neck and shoulders. Pain from a trigger point lasts for a long time. It can make it hard to keep moving. You might not be able to do the exercise or physical therapy that could help you deal with the pain. A trigger point injection may help. It does not work for everyone. But, it may relieve your pain for a few days or a few months. A trigger point injection does not cure long-lasting (chronic) pain. LET YOUR CAREGIVER KNOW ABOUT:  Any allergies (especially to latex, lidocaine, or steroids).  Blood-thinning medicines that you take. These drugs can lead to bleeding or bruising after an injection. They include:  Aspirin.  Ibuprofen.  Clopidogrel.  Warfarin.  Other medicines you take. This includes all vitamins, herbs, eyedrops, over-the-counter medicines, and creams.  Use of steroids.  Recent infections.  Past problems with numbing medicines.  Bleeding problems.  Surgeries you have had.  Other health problems. RISKS AND COMPLICATIONS A trigger point injection is a safe treatment. However, problems may develop, such as:  Minor side effects usually go away in 1 to 2 days. These may include:  Soreness.  Bruising.  Stiffness.  More serious problems are rare. But, they may include:  Bleeding under the skin (hematoma).  Skin infection.  Breaking off of the needle under your skin.  Lung  puncture.  The trigger point injection may not work for you. BEFORE THE PROCEDURE You may need to stop taking any medicine that thins your blood. This is to prevent bleeding and bruising. Usually these medicines are stopped several days before the injection. No other preparation is needed. PROCEDURE  A trigger point injection can be given in your caregiver's office or in a clinic. Each injection takes 2 minutes or less.  Your caregiver will feel for trigger points. The caregiver may use a marker to circle the area for the injection.  The skin over the trigger point will be washed with a germ-killing (antiseptic) solution.  The caregiver pinches the spot for the injection.  Then, a very thin needle is used for the shot. You may feel pain or a twitching feeling when the needle enters the trigger point.  A numbing solution may be injected into the trigger point. Sometimes a drug to keep down swelling, redness, and warmth (inflammation) is also injected.  Your caregiver moves the needle around the trigger zone until the tightness and twitching goes away.  After the injection, your caregiver may put gentle pressure over the injection site.  Then it is covered with a bandage. AFTER THE PROCEDURE  You can go right home after the injection.  The bandage can be taken off after a few hours.  You may feel sore and stiff for 1 to 2 days.  Go back to your regular activities slowly. Your caregiver may ask you to stretch your muscles. Do not do anything that takes   extra energy for a few days.  Follow your caregiver's instructions to manage and treat other pain.   This information is not intended to replace advice given to you by your health care provider. Make sure you discuss any questions you have with your health care provider.   Document Released: 09/03/2011 Document Revised: 01/09/2013 Document Reviewed: 09/03/2011 Elsevier Interactive Patient Education 2016 [employer] Pain  Management Discharge Instructions  General Discharge Instructions :  If you need to reach your doctor call: Monday-Friday 8:00 am - 4:00 pm at [phone] or toll free [phone].  After clinic hours [phone] to have operator reach doctor.  Bring all of your medication bottles to all your appointments in the pain clinic.  To cancel or reschedule your appointment with Pain Management please remember to call 24 hours in advance to avoid a fee.  Refer to the educational materials which you have been given on: General Risks, I had my Procedure. Discharge Instructions, Post Sedation.  Post Procedure Instructions:  The drugs you were given will stay in your system until tomorrow, so for the next 24 hours you should not drive, make any legal decisions or drink any alcoholic beverages.  You may eat anything you prefer, but it is better to start with liquids then soups and crackers, and gradually work up to solid foods.  Please notify your doctor immediately if you have any unusual bleeding, trouble breathing or pain that is not related to your normal pain.  Depending on the type of procedure that was done, some parts of your body may feel week and/or numb.  This usually clears up by tonight or the next day.  Walk with the use of an assistive device or accompanied by an adult for the 24 hours.  You may use ice on the affected area for the first 24 hours.  Put ice in a Ziploc bag and cover with a towel and place against area 15 minutes on 15 minutes off.  You may switch to heat after 24 hours.

## 2016-04-25 NOTE — Progress Notes (Addendum)
   CC:  Centralized low back pain and right posterior low back pain   HPI:  Mr. Brenda Cardenas presents for reevaluation today. She was last seen in clinic in April at which point she had trigger point injection times to the right lumbar and sacral region. She states that she received significant relief with that to the point where she is been doing very well. She is sleeping better at night able to stand for longer and then ambulating better. Otherwise no change in her lower extremity strength or function are noted at this time. She is having some recurrence of the same quality characteristic and distribution of pain as previously reported and this is the same pain that has responded favorably to trigger point injections in the past. Her bowel and bladder function have been stable no changes in lower extremity strength or function are noted.   Physical Exam:    PERRL, EOMI  Heart RRR   LCTA  Musculoskeletal: She is less tender in the right paraspinous lumbar region with less significant tenderness. She also has a trigger point in the left low lumbar region approximately L5 left side. She has good ambulation with an antalgic gait but she is more mobile than on previous evaluations and appears to be more comfortable.  Assessment:   1. Myofascial pain syndrome with trigger points evident on today's examination   2. Spinal stenosis   3. Scoliosis  . 4. Degenerative disc disease with bilateral L5-S1 radicular symptoms   5. Facet arthropathy   PLAN:   We will have her return to clinic in approximately 3 month for reevaluation. I'll defer on any repeat trigger point injections today and have her continue with her back stretch and strengthening exercises. She appears to be making good progress. @James  Andree Elk, MD@

## 2016-04-28 NOTE — Telephone Encounter (Signed)
Faxed completed p/a form along w/bone density scan from 11/27/2015 to [facility].

## 2016-04-28 NOTE — Telephone Encounter (Signed)
Noted, thanks!

## 2016-05-11 ENCOUNTER — Telehealth: Payer: Self-pay | Admitting: Internal Medicine

## 2016-05-11 NOTE — Telephone Encounter (Signed)
Spoke with the patient and scheduled for Prolia injection on the 24th, thanks

## 2016-05-11 NOTE — Telephone Encounter (Signed)
Ordered injection in [facility], thanks

## 2016-05-11 NOTE — Telephone Encounter (Signed)
Pt called wanting to know if her Prolia was approved? Please advise?  Call pt @ [phone]. Thank you!

## 2016-05-19 NOTE — Telephone Encounter (Signed)
Patient made aware of injection costs, thanks

## 2016-05-19 NOTE — Telephone Encounter (Signed)
I have not rec'd the written authorization for Prolia from [facility] yet, so I called them to find out the hold up. Brenda Cardenas has been approved per [place] w/[employer].  Approval [id number] is effective 04/30/2016-09/27/2016.    Brenda Cardenas will have an estimated responsibility of $40.  Please make her aware this is an estimate and we will not know an exact amt until both her insurances have paid.  I have sent the summary of benefits to be scanned into her chart.  Once she has rec'd her injections, please let me know so that I can update the Prolia portal.  If you have any questions, please let me know.  Thank you!

## 2016-05-21 ENCOUNTER — Ambulatory Visit (INDEPENDENT_AMBULATORY_CARE_PROVIDER_SITE_OTHER): Payer: Medicare Other

## 2016-05-21 DIAGNOSIS — M810 Age-related osteoporosis without current pathological fracture: Secondary | ICD-10-CM

## 2016-05-21 MED ORDER — DENOSUMAB 60 MG/ML ~~LOC~~ SOLN
60.0000 mg | Freq: Once | SUBCUTANEOUS | Status: AC
  Administered 2016-05-21: 60 mg via SUBCUTANEOUS

## 2016-05-21 NOTE — Progress Notes (Signed)
Patient presented for Prolia injection.  Administered R arm, subcutaneous.  Tolerated well.  Verbalized understanding and given educational material to call in January for next dose due in February.

## 2016-05-22 NOTE — Progress Notes (Signed)
  I have reviewed the above information and agree with above.   Laelah Siravo, MD 

## 2016-05-26 ENCOUNTER — Ambulatory Visit: Payer: Medicare Other

## 2016-06-02 ENCOUNTER — Telehealth: Payer: Self-pay | Admitting: Internal Medicine

## 2016-06-02 NOTE — Telephone Encounter (Signed)
Pt went to the ED for a cut on the leg. The medications is making her sick. Should she stop taking it? Cell [phone].

## 2016-06-02 NOTE — Telephone Encounter (Signed)
Spoke with the patient, she had gone to [facility] and was put on a antibioitc that was making her have diarrhea and has made her urine change.  She is also using Bactroban on the incision.  [place] approximately 2 weeks ago and bumped her shin, and it is just red with a scab, no drainage, not hot).  Felipa Evener called her back today and advised not to take the antibiotic anymore.  She is concerned and wants it looked at. Scheduled with [facility] on Wednesday. thanks

## 2016-06-03 ENCOUNTER — Encounter: Payer: Self-pay | Admitting: Family

## 2016-06-03 ENCOUNTER — Ambulatory Visit (INDEPENDENT_AMBULATORY_CARE_PROVIDER_SITE_OTHER): Payer: Medicare Other | Admitting: Family

## 2016-06-03 VITALS — BP 108/70 | HR 70 | Temp 97.9°F | Resp 16 | Ht 66.0 in | Wt 158.0 lb

## 2016-06-03 DIAGNOSIS — S81802D Unspecified open wound, left lower leg, subsequent encounter: Secondary | ICD-10-CM

## 2016-06-03 DIAGNOSIS — S81002D Unspecified open wound, left knee, subsequent encounter: Secondary | ICD-10-CM | POA: Diagnosis not present

## 2016-06-03 DIAGNOSIS — R300 Dysuria: Secondary | ICD-10-CM | POA: Diagnosis not present

## 2016-06-03 DIAGNOSIS — S91002D Unspecified open wound, left ankle, subsequent encounter: Secondary | ICD-10-CM

## 2016-06-03 LAB — POCT URINALYSIS DIPSTICK
Bilirubin, UA: NEGATIVE
Glucose, UA: NEGATIVE
Ketones, UA: NEGATIVE
NITRITE UA: NEGATIVE
PH UA: 5
PROTEIN UA: 30
Spec Grav, UA: >=1.03
Urobilinogen, UA: 0.2

## 2016-06-03 NOTE — Patient Instructions (Signed)
Please continue the dressing changes and applying Bactroban as you have been doing. I want you to remain very vigilant regarding your left lower leg if the redness, warmth, swelling increases in any way, please let our office know as I would really prefer you to be on oral antibiotic however I understand you have many allergies. In terms of diarrhea, if this becomes more frequent to 2 times a day as reported, please let us know immediately as there is an infection associated with clindamycin.  If there is no improvement in your symptoms, or if there is any worsening of symptoms, or if you have any additional concerns, please return for re-evaluation; or, if we are closed, consider going to the Emergency Room for evaluation if symptoms urgent.

## 2016-06-03 NOTE — Progress Notes (Signed)
Subjective:    Patient ID: Brenda Cardenas, female    DOB: 31-Mar-1932, 82 y.o.   MRN: [id number]  CC: Brenda Cardenas is a 82 y.o. female who presents today for an acute visit.    HPI: Patient here for wound check and possible UTI. Fell 2 weeks ago while in attic and bumped left shin. Didn't hit head or LOC. Went to walk in clinic and started on clindamycin for 5 days which caused diarrhea 2x day and changes to her urine. She has since stopped medication. Offered kelfex however declined due to allergy. She notes that stool is pencil like, no foul smell. Diarrhea not awakening at night. Using bactroban on incision and performing dressing changes at night. No drainage, fever, chills.   Has h/o of recurrent UTIs as follows with urology. Noticed a 'twinge of burning yesterday'. No flank pain.  Diarrhea from ciprofloxacin couple of months ago.   Politely declines oral antibiotic as she has such an intolerance due to current diarrhea and difficulty in taking antibiotics.       HISTORY:  Past Medical History:  Diagnosis Date  . Arthritis   . Chronic kidney disease   . Colon polyps   . GERD (gastroesophageal reflux disease)   . Hyperlipidemia   . Hypertension    Past Surgical History:  Procedure Laterality Date  . APPENDECTOMY  1959  . CHOLECYSTECTOMY  1995  . JOINT REPLACEMENT Right July 2009   [facility]   . TONSILLECTOMY AND ADENOIDECTOMY     Family History  Problem Relation Age of Onset  . Heart disease Mother   . Arthritis Mother   . Diabetes Father     Allergies: Clindamycin/lincomycin; Sulfa antibiotics; Augmentin [amoxicillin-pot clavulanate]; Naproxen sodium; Tramadol; Acetaminophen; Codeine; Doxycycline; Doxycycline hyclate; E-mycin [erythromycin]; Lipitor [atorvastatin]; Macrobid [nitrofurantoin]; Nitrofurantoin monohyd macro; Nsaids; Phenobarbital; Relafen [nabumetone]; Statins; Sulfacetamide sodium; Sulfasalazine; and Tramadol hcl Current Outpatient Prescriptions on File  Prior to Visit  Medication Sig Dispense Refill  . acetaminophen (TYLENOL) 325 MG tablet Take 650 mg by mouth 2 (two) times daily. May take up to four  Per day/ 2 in the morning and 2 in the afternoon    . atenolol (TENORMIN) 25 MG tablet TAKE 1 TABLET EVERY DAY 90 tablet 1  . calcium citrate-vitamin D (CITRACAL+D) 315-200 MG-UNIT per tablet Take 2 tablets by mouth daily.    Marland Kitchen denosumab (PROLIA) 60 MG/ML SOLN injection Inject 60 mg into the skin every 6 (six) months. Administer in upper arm, thigh, or abdomen    . ezetimibe (ZETIA) 10 MG tablet TAKE 1 TABLET EVERY DAY 30 tablet 4  . ipratropium (ATROVENT) 0.06 % nasal spray 1 spray each side before each meal 15 mL 12  . Misc Natural Product Nasal (GELONASAL NA) Place 2 sprays into the nose 2 (two) times daily as needed. Reported on 01/16/2016    . Multiple Vitamins-Minerals (MULTIVITAMIN WITH MINERALS) tablet Take 1 tablet by mouth daily.    Marland Kitchen omeprazole (PRILOSEC) 20 MG capsule Take 1 capsule (20 mg total) by mouth 2 (two) times daily before a meal. (Patient taking differently: Take 20 mg by mouth daily. ) 60 capsule 5  . Probiotic Product (ALIGN PO) Take by mouth daily.    . fosfomycin (MONUROL) 3 g PACK Take 3 g by mouth once. (Patient not taking: Reported on 06/03/2016) 3 g 0  . Probiotic Product ([place]) CAPS Take 1 capsule by mouth daily.     Current Facility-Administered Medications on File Prior to Visit  Medication Dose Route Frequency Provider Last Rate Last Dose  . denosumab (PROLIA) injection 60 mg  60 mg Subcutaneous Once Crecencio Mc, MD        Social History  Substance Use Topics  . Smoking status: Never Smoker  . Smokeless tobacco: Never Used  . Alcohol use No    Review of Systems  Constitutional: Negative for chills and fever.  Respiratory: Negative for cough.   Cardiovascular: Negative for chest pain and palpitations.  Gastrointestinal: Positive for diarrhea. Negative for blood in stool, nausea and  vomiting.  Genitourinary: Negative for dysuria and frequency.  Skin: Positive for wound.      Objective:    BP 108/70 (BP Location: Left Arm, Patient Position: Sitting, Cuff Size: Normal)   Pulse 70   Temp 97.9 F (36.6 C) (Oral)   Resp 16   Ht 5\' 6"  (1.676 m)   Wt 158 lb (71.7 kg)   SpO2 96%   BMI 25.50 kg/m    Physical Exam  Constitutional: She appears well-developed and well-nourished.  Eyes: Conjunctivae are normal.  Cardiovascular: Normal rate, regular rhythm, normal heart sounds and normal pulses.   No LE edema, palpable cords or masses. No erythema or increased warmth. No asymmetry in calf size when compared bilaterally LE hair growth symmetric and present. No discoloration of varicosities noted. LE warm and palpable pedal pulses.   Pulmonary/Chest: Effort normal and breath sounds normal. She has no wheezes. She has no rhonchi. She has no rales.  Neurological: She is alert.  Skin: Skin is warm and dry. Abrasion noted.     Psychiatric: She has a normal mood and affect. Her speech is normal and behavior is normal. Thought content normal.  Vitals reviewed.      Assessment & Plan:   1. Dysuria UA showed Large leukocytes, protein, and moderate blood. Negative nitrites. Patient and I jointly decided to wait on urine culture prior to starting medication. Likely would have to restart Monuril again due to patient's extensive list of allergies. Patient asked to discuss this with her PCP which I will do at that time.   - POCT urinalysis dipstick - CULTURE, URINE COMPREHENSIVE  2. Open wound of knee, leg (except thigh), and ankle, complicated, left, subsequent encounter Patient is using Bactroban and has just stopped clindamycin yesterday to diarrhea. Diarrhea reported twice a day without odor and is not awakening patient from sleep. Advised patient close vigilance clindamycin has a known side effect of C. difficile. As patient is just stop medication today, we jointly  decided to closely monitor diarrhea and if does not improve, worsens, we will offer stool culture. Patient has a history of diarrhea from antibiotics and I have some concern that adding another antibiotic may worsen antibiotic associated diarrhea.  Wound appears infected however it is localized at this time. No systemic features. Patient received 5 days of clindamycin and would like to try topical antibiotics only at this time. I agreed as long as erythema, swelling, pain does not increase and starts to improve. Return precautions given. Wound redressed in our office.     I have discontinued Ms. Hernandez's naproxen sodium and ciprofloxacin. I am also having her maintain her calcium citrate-vitamin D, multivitamin with minerals, Misc Natural Product Nasal (GELONASAL NA), denosumab, acetaminophen, omeprazole, [facility], ipratropium, atenolol, ezetimibe, fosfomycin, and Probiotic Product (ALIGN PO). We will continue to administer denosumab.   No orders of the defined types were placed in this encounter.   Return precautions given.  Risks, benefits, and alternatives of the medications and treatment plan prescribed today were discussed, and patient expressed understanding.   Education regarding symptom management and diagnosis given to patient on AVS.  Continue to follow with TULLO, Aris Everts, MD for routine health maintenance.   [name] and I agreed with plan.   Mable Paris, FNP

## 2016-06-05 ENCOUNTER — Ambulatory Visit (INDEPENDENT_AMBULATORY_CARE_PROVIDER_SITE_OTHER): Payer: Medicare Other | Admitting: Family Medicine

## 2016-06-05 ENCOUNTER — Encounter: Payer: Self-pay | Admitting: Family Medicine

## 2016-06-05 DIAGNOSIS — N3000 Acute cystitis without hematuria: Secondary | ICD-10-CM

## 2016-06-05 DIAGNOSIS — S81802D Unspecified open wound, left lower leg, subsequent encounter: Secondary | ICD-10-CM

## 2016-06-05 LAB — CULTURE, URINE COMPREHENSIVE

## 2016-06-05 MED ORDER — CIPROFLOXACIN HCL 500 MG PO TABS: 500.0000 mg | ORAL_TABLET | Freq: Two times a day (BID) | ORAL | 0 refills | Status: DC

## 2016-06-05 NOTE — Progress Notes (Signed)
Pre visit review using our clinic review tool, if applicable. No additional management support is needed unless otherwise documented below in the visit note. 

## 2016-06-05 NOTE — Patient Instructions (Signed)
Try the medihoney. You can get it at your local pharmacy.  Cipro twice daily.  Follow up as needed.  Take care  Dr. Lacinda Axon

## 2016-06-06 ENCOUNTER — Encounter: Payer: Self-pay | Admitting: Internal Medicine

## 2016-06-06 ENCOUNTER — Encounter: Payer: Self-pay | Admitting: Family

## 2016-06-06 DIAGNOSIS — I10 Essential (primary) hypertension: Secondary | ICD-10-CM

## 2016-06-06 DIAGNOSIS — M810 Age-related osteoporosis without current pathological fracture: Secondary | ICD-10-CM

## 2016-06-07 DIAGNOSIS — S81802A Unspecified open wound, left lower leg, initial encounter: Secondary | ICD-10-CM | POA: Insufficient documentation

## 2016-06-07 DIAGNOSIS — N390 Urinary tract infection, site not specified: Secondary | ICD-10-CM | POA: Insufficient documentation

## 2016-06-07 NOTE — Assessment & Plan Note (Signed)
Established problem, stable. Healing slowly. Trial of Medihoney. No evidence of infection to warrant antibiotics.

## 2016-06-07 NOTE — Assessment & Plan Note (Signed)
New acute problem. Culture revealed pansensitive Escherichia coli. Treating with Cipro.

## 2016-06-07 NOTE — Progress Notes (Signed)
Subjective:  Patient ID: Brenda Cardenas, female    DOB: April 29, 1932  Age: 82 y.o. MRN: [id number]  CC: Leg wound, UTI  HPI:  82 year old female presents with the above complaints.  Left leg wound  Patient states that she fell and headache approximately 3 weeks ago.  Patient states that she developed a wound on her left lower leg.  She has been seen twice regarding this.  She has been placed with antibiotics as well as topical Bactroban.  Patient reports that she is not happy with the use of the Bactroban.  She states that after she applies it, the eschar comes off and then it subsequently is irritated and bleeds.  The wound has some surrounding redness.  No current drainage.  No fever or chills.  She would like to discuss this today.  UTI  At last visit on 9/6, patient presented with some dysuria.  UA and culture were obtained.   She was not treated.  It was decided to wait on the culture.  Patient is concerned about her symptoms.  She wants to know about her culture and whether she should start antibiotic.  Social Hx   Social History   Social History  . Marital status: Single    Spouse name: N/A  . Number of children: N/A  . Years of education: N/A   Social History Main Topics  . Smoking status: Never Smoker  . Smokeless tobacco: Never Used  . Alcohol use No  . Drug use: No  . Sexual activity: No   Other Topics Concern  . None   Social History Narrative  . None   Review of Systems  Constitutional: Negative.   Genitourinary: Positive for dysuria.  Skin: Positive for wound.   Objective:  BP (!) 162/84 (BP Location: Left Arm, Patient Position: Sitting, Cuff Size: Normal)   Pulse 74   Temp 97.8 F (36.6 C) (Oral)   SpO2 98%   BP/Weight 06/05/2016 06/03/2016 99991111  Systolic BP 0000000 [id number]  Diastolic BP 84 70 63  Wt. (Lbs) - 158 156  BMI - 25.5 25.18    Physical Exam  Constitutional: She is oriented to person, place, and time. She  appears well-developed. No distress.  Pulmonary/Chest: Effort normal.  Neurological: She is alert and oriented to person, place, and time.  Skin:  Left lower leg - wound with eschar. Minimal surrounding erythema. No drainage from the wound. No warmth.  Psychiatric: She has a normal mood and affect.  Vitals reviewed.   Lab Results  Component Value Date   WBC 8.5 12/16/2015   HGB 13.4 12/16/2015   HCT 40.8 12/16/2015   PLT 173.0 12/16/2015   GLUCOSE 97 12/16/2015   CHOL 185 12/16/2015   TRIG 115.0 12/16/2015   HDL 55.20 12/16/2015   LDLCALC 107 (H) 12/16/2015   ALT 18 12/16/2015   AST 21 12/16/2015   NA 139 12/16/2015   K 4.4 12/16/2015   CL 104 12/16/2015   CREATININE 0.73 12/16/2015   BUN 25 (H) 12/16/2015   CO2 29 12/16/2015   TSH 1.16 12/16/2015    Assessment & Plan:   Problem List Items Addressed This Visit    Leg wound, left    Established problem, stable. Healing slowly. Trial of Medihoney. No evidence of infection to warrant antibiotics.      UTI (urinary tract infection)    New acute problem. Culture revealed pansensitive Escherichia coli. Treating with Cipro.       Other Visit Diagnoses  None.     Meds ordered this encounter  Medications  . ciprofloxacin (CIPRO) 500 MG tablet    Sig: Take 1 tablet (500 mg total) by mouth 2 (two) times daily.    Dispense:  14 tablet    Refill:  0    Follow-up: PRN  [place]

## 2016-06-15 ENCOUNTER — Other Ambulatory Visit (INDEPENDENT_AMBULATORY_CARE_PROVIDER_SITE_OTHER): Payer: Medicare Other

## 2016-06-15 DIAGNOSIS — M810 Age-related osteoporosis without current pathological fracture: Secondary | ICD-10-CM

## 2016-06-15 DIAGNOSIS — I10 Essential (primary) hypertension: Secondary | ICD-10-CM | POA: Diagnosis not present

## 2016-06-15 LAB — COMPREHENSIVE METABOLIC PANEL
ALT: 18 U/L (ref 0–35)
AST: 17 U/L (ref 0–37)
Albumin: 3.5 g/dL (ref 3.5–5.2)
Alkaline Phosphatase: 98 U/L (ref 39–117)
BUN: 24 mg/dL — ABNORMAL HIGH (ref 6–23)
CHLORIDE: 106 meq/L (ref 96–112)
CO2: 27 meq/L (ref 19–32)
CREATININE: 0.78 mg/dL (ref 0.40–1.20)
Calcium: 9 mg/dL (ref 8.4–10.5)
GFR: 75.05 mL/min (ref >60.00)
Glucose, Bld: 124 mg/dL — ABNORMAL HIGH (ref 70–99)
POTASSIUM: 4 meq/L (ref 3.5–5.1)
Sodium: 139 mEq/L (ref 135–145)
Total Bilirubin: 0.4 mg/dL (ref 0.2–1.2)
Total Protein: 5.9 g/dL — ABNORMAL LOW (ref 6.0–8.3)

## 2016-06-15 LAB — VITAMIN D 25 HYDROXY (VIT D DEFICIENCY, FRACTURES): VITD: 28.65 ng/mL — ABNORMAL LOW (ref 30.00–100.00)

## 2016-06-17 ENCOUNTER — Encounter: Payer: Self-pay | Admitting: Internal Medicine

## 2016-06-22 ENCOUNTER — Ambulatory Visit (INDEPENDENT_AMBULATORY_CARE_PROVIDER_SITE_OTHER): Payer: Medicare Other | Admitting: Internal Medicine

## 2016-06-22 ENCOUNTER — Telehealth: Payer: Self-pay | Admitting: Internal Medicine

## 2016-06-22 VITALS — BP 128/70 | HR 59 | Temp 97.8°F | Resp 12 | Ht 66.0 in | Wt 158.0 lb

## 2016-06-22 DIAGNOSIS — N3090 Cystitis, unspecified without hematuria: Secondary | ICD-10-CM

## 2016-06-22 DIAGNOSIS — N3080 Other cystitis without hematuria: Secondary | ICD-10-CM

## 2016-06-22 DIAGNOSIS — R5382 Chronic fatigue, unspecified: Secondary | ICD-10-CM | POA: Diagnosis not present

## 2016-06-22 DIAGNOSIS — E559 Vitamin D deficiency, unspecified: Secondary | ICD-10-CM | POA: Diagnosis not present

## 2016-06-22 DIAGNOSIS — Z23 Encounter for immunization: Secondary | ICD-10-CM

## 2016-06-22 DIAGNOSIS — M810 Age-related osteoporosis without current pathological fracture: Secondary | ICD-10-CM

## 2016-06-22 DIAGNOSIS — S81802D Unspecified open wound, left lower leg, subsequent encounter: Secondary | ICD-10-CM

## 2016-06-22 DIAGNOSIS — I878 Other specified disorders of veins: Secondary | ICD-10-CM

## 2016-06-22 DIAGNOSIS — Z79899 Other long term (current) drug therapy: Secondary | ICD-10-CM | POA: Diagnosis not present

## 2016-06-22 DIAGNOSIS — E785 Hyperlipidemia, unspecified: Secondary | ICD-10-CM

## 2016-06-22 NOTE — Telephone Encounter (Signed)
I have had Denisa fix this as she gave the injection that day.  Please let the patient know it is fixed, thanks

## 2016-06-22 NOTE — Patient Instructions (Addendum)
You are currently taking 1500 IUS of D3 daily and yor level is a little low, so:  I want you to add 1000 IUs of  Vitamin D3  Daily   In a separate pill    I would continue your probiotic for life!!   Keep your left leg wound  Covered in a thin layer of vaseline and a nonstick dressing.  Rise with saline water after shower to cleanse.  Wear compressing stocking  daily all day    Please allow the office to set you up for a "wellness visit "  With Denisa .  You wil enjoy it and it's free!!   Your thyroid function was normal in March.  We will  Check it again in March

## 2016-06-22 NOTE — Telephone Encounter (Signed)
Called pt and let her know.

## 2016-06-22 NOTE — Telephone Encounter (Signed)
Where does this need to be documented?

## 2016-06-22 NOTE — Progress Notes (Signed)
Subjective:  Patient ID: Brenda Cardenas, female    DOB: Mar 21, 1932  Age: 82 y.o. MRN: [id number]  CC: The primary encounter diagnosis was Long-term use of high-risk medication. Diagnoses of Hyperlipidemia, Vitamin D deficiency, Chronic fatigue, Recurrent cystitis, Osteoporosis, Leg wound, left, subsequent encounter, Hypovitaminosis D, and Venous stasis of both lower extremities were also pertinent to this visit.  HPI Brenda Cardenas presents for follow up on multiple issues   Had a fall  at home 5 weeks ago while trying to navigate around the [place].  She Tripped over a box and sustained a minor gash left tibia which she had evaluated and treated at Urgent Care because of concern for cellulitis.  Was treated  with clindamycin .  Then saw Dr Lacinda Axon,  Was given cipro,  Then seen  by  Dermatology who advised to stop taking antibiotics,  cover the scab which was by this time overly dry , and managed her  venous stasis with leg elevation and compression garments. .  ,    History of UTI in June  Secondary to E. Coli  Treated with ciprofloxacin.  Had another E Coli UTI in September treated by Dr Lacinda Axon but patient did not trust his judgement and asked me to review the choice of antibiotics, which I reviewed and agreed with.  Her symptoms resolved.    Taking probiotic daily . Spent 5 minutes reviewing the last two cultures with patient along with a  brief explanation of microbiology , hopefully to avoid this kind of conflict in the future.    Chronic low back pain.  Has been Using Arnical gel for arthritis on lower back .working great . Has not had an  ESI In the last year.  Gets Trigger injections in March  At the Pain clinic by Dr Andree Elk  Osteoporosis and low Vitamin D.  Had prolia injection August 24.  Reviewed calcium and Vitamin D intake  .     Outpatient Medications Prior to Visit  Medication Sig Dispense Refill  . acetaminophen (TYLENOL) 325 MG tablet Take 650 mg by mouth 2 (two) times daily. May take up  to four  Per day/ 2 in the morning and 2 in the afternoon    . atenolol (TENORMIN) 25 MG tablet TAKE 1 TABLET EVERY DAY 90 tablet 1  . calcium citrate-vitamin D (CITRACAL+D) 315-200 MG-UNIT per tablet Take 2 tablets by mouth daily.    Marland Kitchen denosumab (PROLIA) 60 MG/ML SOLN injection Inject 60 mg into the skin every 6 (six) months. Administer in upper arm, thigh, or abdomen    . ezetimibe (ZETIA) 10 MG tablet TAKE 1 TABLET EVERY DAY 30 tablet 4  . ipratropium (ATROVENT) 0.06 % nasal spray 1 spray each side before each meal 15 mL 12  . Misc Natural Product Nasal (GELONASAL NA) Place 2 sprays into the nose 2 (two) times daily as needed. Reported on 01/16/2016    . Multiple Vitamins-Minerals (MULTIVITAMIN WITH MINERALS) tablet Take 1 tablet by mouth daily.    Marland Kitchen omeprazole (PRILOSEC) 20 MG capsule Take 1 capsule (20 mg total) by mouth 2 (two) times daily before a meal. (Patient taking differently: Take 20 mg by mouth daily. ) 60 capsule 5  . Probiotic Product (ALIGN PO) Take by mouth daily.    . ciprofloxacin (CIPRO) 500 MG tablet Take 1 tablet (500 mg total) by mouth 2 (two) times daily. (Patient not taking: Reported on 06/22/2016) 14 tablet 0  . fosfomycin (MONUROL) 3 g PACK Take 3 g  by mouth once. (Patient not taking: Reported on 06/22/2016) 3 g 0  . Probiotic Product ([place]) CAPS Take 1 capsule by mouth daily.     No facility-administered medications prior to visit.     Review of Systems;  Patient denies headache, fevers, malaise, unintentional weight loss, skin rash, eye pain, sinus congestion and sinus pain, sore throat, dysphagia,  hemoptysis , cough, dyspnea, wheezing, chest pain, palpitations, orthopnea, edema, abdominal pain, nausea, melena, diarrhea, constipation, flank pain, dysuria, hematuria, urinary  Frequency, nocturia, numbness, tingling, seizures,  Focal weakness, Loss of consciousness,  Tremor, insomnia, depression, anxiety, and suicidal ideation.      Objective:  BP  128/70   Pulse (!) 59   Temp 97.8 F (36.6 C) (Oral)   Resp 12   Ht 5\' 6"  (1.676 m)   Wt 158 lb (71.7 kg)   SpO2 97%   BMI 25.50 kg/m   BP Readings from Last 3 Encounters:  06/22/16 128/70  06/05/16 (!) 162/84  06/03/16 108/70    Wt Readings from Last 3 Encounters:  06/22/16 158 lb (71.7 kg)  06/03/16 158 lb (71.7 kg)  04/22/16 156 lb (70.8 kg)    General appearance: alert, cooperative and appears stated age Neck: no adenopathy, no carotid bruit, supple, symmetrical, trachea midline and thyroid not enlarged, symmetric, no tenderness/mass/nodules Back: symmetric, no curvature. ROM normal. No CVA tenderness. Lungs: clear to auscultation bilaterally Heart: regular rate and rhythm, S1, S2 normal, no murmur, click, rub or gallop Abdomen: soft, non-tender; bowel sounds normal; no masses,  no organomegaly Pulses: 2+ and symmetric Skin: brawny skins changes bilaterally to mid tibia.  Left lower tibia with quarter sized eschar,  Granulating tissue in center of wound.  No erythema or warmth .  no rashes or lesions Lymph nodes: Cervical, supraclavicular, and axillary nodes normal.  No results found for: HGBA1C  Lab Results  Component Value Date   CREATININE 0.78 06/15/2016   CREATININE 0.73 12/16/2015   CREATININE 0.72 06/17/2015    Lab Results  Component Value Date   WBC 8.5 12/16/2015   HGB 13.4 12/16/2015   HCT 40.8 12/16/2015   PLT 173.0 12/16/2015   GLUCOSE 124 (H) 06/15/2016   CHOL 185 12/16/2015   TRIG 115.0 12/16/2015   HDL 55.20 12/16/2015   LDLCALC 107 (H) 12/16/2015   ALT 18 06/15/2016   AST 17 06/15/2016   NA 139 06/15/2016   K 4.0 06/15/2016   CL 106 06/15/2016   CREATININE 0.78 06/15/2016   BUN 24 (H) 06/15/2016   CO2 27 06/15/2016   TSH 1.16 12/16/2015    No results found.  Assessment & Plan:   Problem List Items Addressed This Visit    Recurrent cystitis    Recent infections and choice of antibiotics reviewed with patient.  Advised against  repeat testing unless symptomatic.      Osteoporosis      Reviewed use of calcium and Vit D supplements.  Continue Prolia.  Next injection due in late February 2018.       Leg wound, left    Wound is overly dry due to current care practices.  Reassured patient that there is no sign of infection.  Advised to cover wound with sterile vaseline and non stick dressing,  Dressing applied by RN and tegaderm applied as well.      Hypovitaminosis D    Patient currently taking 1500 Ius daily and slightly low. Given histor yof falls and osteoporosis, Advised to increase total supplementation  to 2500 IUs D3 daily       Venous stasis of both lower extremities    Explained to patient the difference between cellulitis and venous stasis changes.  Advised to use compression stockings        Other Visit Diagnoses    Long-term use of high-risk medication    -  Primary   Relevant Orders   Comprehensive metabolic panel   Hyperlipidemia       Relevant Orders   Lipid panel   Vitamin D deficiency       Relevant Orders   VITAMIN D 25 Hydroxy (Vit-D Deficiency, Fractures)   Chronic fatigue       Relevant Orders   TSH    A total of 25 minutes of face to face time was spent with patient more than half of which was spent in counselling and coordination of care    I have discontinued Ms. Tierney's [facility], fosfomycin, and ciprofloxacin. I am also having her maintain her calcium citrate-vitamin D, multivitamin with minerals, Misc Natural Product Nasal (GELONASAL NA), denosumab, acetaminophen, omeprazole, ipratropium, atenolol, ezetimibe, and Probiotic Product (ALIGN PO).  No orders of the defined types were placed in this encounter.   Medications Discontinued During This Encounter  Medication Reason  . Probiotic Product ([place]) CAPS Error  . fosfomycin (MONUROL) 3 g PACK   . ciprofloxacin (CIPRO) 500 MG tablet     Follow-up: Return in about 6 months (around 12/20/2016),  or fasting labs prior.   Crecencio Mc, MD

## 2016-06-22 NOTE — Telephone Encounter (Signed)
Pt was seen today by Dr. Derrel Nip. She noticed on her paper work that it says she is overdue for her prolia injection. She had this on August 24th. Could  This be update?

## 2016-06-22 NOTE — Progress Notes (Signed)
Pre-visit discussion using our clinic review tool. No additional management support is needed unless otherwise documented below in the visit note.  

## 2016-06-23 ENCOUNTER — Ambulatory Visit (INDEPENDENT_AMBULATORY_CARE_PROVIDER_SITE_OTHER): Payer: Medicare Other

## 2016-06-23 VITALS — BP 120/68 | HR 61 | Temp 98.1°F | Resp 14 | Ht 66.0 in | Wt 158.0 lb

## 2016-06-23 DIAGNOSIS — I878 Other specified disorders of veins: Secondary | ICD-10-CM | POA: Insufficient documentation

## 2016-06-23 DIAGNOSIS — E559 Vitamin D deficiency, unspecified: Secondary | ICD-10-CM | POA: Insufficient documentation

## 2016-06-23 DIAGNOSIS — Z0000 Encounter for general adult medical examination without abnormal findings: Secondary | ICD-10-CM | POA: Diagnosis not present

## 2016-06-23 NOTE — Progress Notes (Signed)
Subjective:   Brenda Cardenas is a 82 y.o. female who presents for Medicare Annual (Subsequent) preventive examination.  Review of Systems:  No ROS.  Medicare Wellness Visit.  Cardiac Risk Factors include: advanced age (>29men, >52 women);hypertension     Objective:     Vitals: BP 120/68 (BP Location: Left Arm, Patient Position: Sitting, Cuff Size: Normal)   Pulse 61   Temp 98.1 F (36.7 C) (Oral)   Resp 14   Ht 5\' 6"  (1.676 m)   Wt 158 lb (71.7 kg) Comment: Pt stated she weighes 158 at home and did not weigh.  SpO2 98%   BMI 25.50 kg/m   Body mass index is 25.5 kg/m.   Tobacco History  Smoking Status  . Never Smoker  Smokeless Tobacco  . Never Used     Counseling given: Not Answered   Past Medical History:  Diagnosis Date  . Arthritis   . Chronic kidney disease   . Colon polyps   . GERD (gastroesophageal reflux disease)   . Hyperlipidemia   . Hypertension    Past Surgical History:  Procedure Laterality Date  . APPENDECTOMY  1959  . CHOLECYSTECTOMY  1995  . JOINT REPLACEMENT Right July 2009   [facility]   . TONSILLECTOMY AND ADENOIDECTOMY     Family History  Problem Relation Age of Onset  . Heart disease Mother   . Arthritis Mother   . Diabetes Father    History  Sexual Activity  . Sexual activity: No    Outpatient Encounter Prescriptions as of 06/23/2016  Medication Sig  . acetaminophen (TYLENOL) 325 MG tablet Take 650 mg by mouth 2 (two) times daily. May take up to four  Per day/ 2 in the morning and 2 in the afternoon  . atenolol (TENORMIN) 25 MG tablet TAKE 1 TABLET EVERY DAY  . calcium citrate-vitamin D (CITRACAL+D) 315-200 MG-UNIT per tablet Take 2 tablets by mouth daily.  . Cholecalciferol (VITAMIN D3) 1000 units CAPS Take 1 capsule by mouth daily.  Marland Kitchen denosumab (PROLIA) 60 MG/ML SOLN injection Inject 60 mg into the skin every 6 (six) months. Administer in upper arm, thigh, or abdomen  . ezetimibe (ZETIA) 10 MG tablet TAKE 1 TABLET EVERY DAY    . ipratropium (ATROVENT) 0.06 % nasal spray 1 spray each side before each meal  . Multiple Vitamins-Minerals (MULTIVITAMIN WITH MINERALS) tablet Take 1 tablet by mouth daily.  Marland Kitchen omeprazole (PRILOSEC) 20 MG capsule Take 1 capsule (20 mg total) by mouth 2 (two) times daily before a meal. (Patient taking differently: Take 20 mg by mouth daily. )  . Probiotic Product (ALIGN PO) Take by mouth daily.  . [DISCONTINUED] Misc Natural Product Nasal (GELONASAL NA) Place 2 sprays into the nose 2 (two) times daily as needed. Reported on 01/16/2016   No facility-administered encounter medications on file as of 06/23/2016.     Activities of Daily Living In your present state of health, do you have any difficulty performing the following activities: 06/23/2016  Hearing? Y  Vision? N  Difficulty concentrating or making decisions? N  Walking or climbing stairs? Y  Dressing or bathing? N  Doing errands, shopping? N  Preparing Food and eating ? N  Using the Toilet? N  In the past six months, have you accidently leaked urine? Y  Do you have problems with loss of bowel control? N  Managing your Medications? N  Managing your Finances? N  Housekeeping or managing your Housekeeping? N  Some recent data might  be hidden    Patient Care Team: Crecencio Mc, MD as PCP - General (Internal Medicine)    Assessment:    This is a routine wellness examination for [name]. The goal of the wellness visit is to assist the patient how to close the gaps in care and create a preventative care plan for the patient.   Taking calcium citrate-vitamin D and VIT D3 as appropriate/Osteoporosis reviewed.  Medications reviewed; taking without issues or barriers.  Safety issues reviewed; [facility].  Smoke detectors in the home. No firearms in the home. Wears seatbelts when driving or riding with others. No violence in the home.  No identified risk were noted; The patient was oriented x 3; appropriate in  dress and manner and no objective failures at ADL's or IADL's.   Body mass index; discussed the importance of a healthy diet, water intake and exercise. She does have a healthy diet, adequate water intake and recently completed physical therapy for L leg.  Uses cane/walker as needed when walking.  Increasing exercises as tolerated.    Health maintenance gaps; closed.  Patient Concerns: None at this time. Follow up with PCP as needed.  Exercise Activities and Dietary recommendations Current Exercise Habits: Structured exercise class (Nu step program), Time (Minutes): 30, Intensity: Mild  Goals    . Increase physical activity           Begin riding bike and increase Nu step exercise regimen, as tolerated.      Fall Risk Fall Risk  06/23/2016 01/16/2016 11/13/2015 09/10/2015 08/13/2015  Falls in the past year? Yes No (No Data) (No Data) No  Number falls in past yr: 1 - - - -  Injury with Fall? Yes - - - -  Risk Factor Category  - - - - -  Risk for fall due to : Other (Comment) - - - -  Risk for fall due to (comments): Wears removable brace on LLE.  Difficulty starightening L foot. Stable and followed by PCP. - - - -  Follow up Falls prevention discussed;Education provided - - - -   Depression Screen PHQ 2/9 Scores 06/23/2016 11/13/2015 09/10/2015 08/13/2015  PHQ - 2 Score 0 0 0 0  Exception Documentation - Patient refusal Patient refusal -     Cognitive Testing MMSE - Mini Mental State Exam 06/23/2016  Orientation to time 5  Orientation to Place 5  Registration 3  Attention/ Calculation 5  Recall 3  Language- name 2 objects 2  Language- repeat 1  Language- follow 3 step command 3  Language- read & follow direction 1  Write a sentence 1  Copy design 1  Total score 30    Immunization History  Administered Date(s) Administered  . Influenza Whole 06/15/2013  . Influenza, High Dose Seasonal PF 06/24/2015, 06/22/2016  . Influenza,inj,Quad PF,36+ Mos 06/18/2014  .  Pneumococcal Conjugate-13 06/18/2014  . Pneumococcal Polysaccharide-23 06/15/2013  . Tdap 09/28/2008  . Zoster 01/05/2008   Screening Tests Health Maintenance  Topic Date Due  . TETANUS/TDAP  09/28/2018  . INFLUENZA VACCINE  Completed  . DEXA SCAN  Completed  . ZOSTAVAX  Completed  . PNA vac Low Risk Adult  Completed      Plan:   End of life planning; Advance aging; Advanced directives discussed. Copy of current HCPOA/Living Will on file.  Medicare Attestation I have personally reviewed: The patient's medical and social history Their use of alcohol, tobacco or illicit drugs Their current medications and supplements  The patient's functional ability including ADLs,fall risks, home safety risks, cognitive, and hearing and visual impairment Diet and physical activities Evidence for depression   The patient's weight, height, BMI, and visual acuity have been recorded in the chart.  I have made referrals and provided education to the patient based on review of the above and I have provided the patient with a written personalized care plan for preventive services.    During the course of the visit the patient was educated and counseled about the following appropriate screening and preventive services:   Vaccines to include Pneumoccal, Influenza, Hepatitis B, Td, Zostavax, HCV  Electrocardiogram  Cardiovascular Disease  Colorectal cancer screening  Bone density screening  Diabetes screening  Glaucoma screening  Mammography/PAP  Nutrition counseling   Patient Instructions (the written plan) was given to the patient.   Varney Biles, LPN  [id number]  Reviewed plan of care and agree with above plan. Joycelyn Schmid, NP

## 2016-06-23 NOTE — Patient Instructions (Addendum)
Brenda Cardenas , Thank you for taking time to come for your Medicare Wellness Visit. I appreciate your ongoing commitment to your health goals. Please review the following plan we discussed and let me know if I can assist you in the future.   Follow up with Dr. Derrel Nip as needed.  These are the goals we discussed: Goals    . Increase physical activity           Begin riding bike and increase Nu step exercise regimen, as tolerated.       This is a list of the screening recommended for you and due dates:  Health Maintenance  Topic Date Due  . Tetanus Vaccine  09/28/2018  . Flu Shot  Completed  . DEXA scan (bone density measurement)  Completed  . Shingles Vaccine  Completed  . Pneumonia vaccines  Completed    Fall Prevention in the Home  Falls can cause injuries. They can happen to people of all ages. There are many things you can do to make your home safe and to help prevent falls.  WHAT CAN I DO ON THE OUTSIDE OF MY HOME?  Regularly fix the edges of walkways and driveways and fix any cracks.  Remove anything that might make you trip as you walk through a door, such as a raised step or threshold.  Trim any bushes or trees on the path to your home.  Use bright outdoor lighting.  Clear any walking paths of anything that might make someone trip, such as rocks or tools.  Regularly check to see if handrails are loose or broken. Make sure that both sides of any steps have handrails.  Any raised decks and porches should have guardrails on the edges.  Have any leaves, snow, or ice cleared regularly.  Use sand or salt on walking paths during winter.  Clean up any spills in your garage right away. This includes oil or grease spills. WHAT CAN I DO IN THE BATHROOM?   Use night lights.  Install grab bars by the toilet and in the tub and shower. Do not use towel bars as grab bars.  Use non-skid mats or decals in the tub or shower.  If you need to sit down in the shower, use a  plastic, non-slip stool.  Keep the floor dry. Clean up any water that spills on the floor as soon as it happens.  Remove soap buildup in the tub or shower regularly.  Attach bath mats securely with double-sided non-slip rug tape.  Do not have throw rugs and other things on the floor that can make you trip. WHAT CAN I DO IN THE BEDROOM?  Use night lights.  Make sure that you have a light by your bed that is easy to reach.  Do not use any sheets or blankets that are too big for your bed. They should not hang down onto the floor.  Have a firm chair that has side arms. You can use this for support while you get dressed.  Do not have throw rugs and other things on the floor that can make you trip. WHAT CAN I DO IN THE KITCHEN?  Clean up any spills right away.  Avoid walking on wet floors.  Keep items that you use a lot in easy-to-reach places.  If you need to reach something above you, use a strong step stool that has a grab bar.  Keep electrical cords out of the way.  Do not use floor polish or wax  that makes floors slippery. If you must use wax, use non-skid floor wax.  Do not have throw rugs and other things on the floor that can make you trip. WHAT CAN I DO WITH MY STAIRS?  Do not leave any items on the stairs.  Make sure that there are handrails on both sides of the stairs and use them. Fix handrails that are broken or loose. Make sure that handrails are as long as the stairways.  Check any carpeting to make sure that it is firmly attached to the stairs. Fix any carpet that is loose or worn.  Avoid having throw rugs at the top or bottom of the stairs. If you do have throw rugs, attach them to the floor with carpet tape.  Make sure that you have a light switch at the top of the stairs and the bottom of the stairs. If you do not have them, ask someone to add them for you. WHAT ELSE CAN I DO TO HELP PREVENT FALLS?  Wear shoes that:  Do not have high heels.  Have  rubber bottoms.  Are comfortable and fit you well.  Are closed at the toe. Do not wear sandals.  If you use a stepladder:  Make sure that it is fully opened. Do not climb a closed stepladder.  Make sure that both sides of the stepladder are locked into place.  Ask someone to hold it for you, if possible.  Clearly mark and make sure that you can see:  Any grab bars or handrails.  First and last steps.  Where the edge of each step is.  Use tools that help you move around (mobility aids) if they are needed. These include:  Canes.  Walkers.  Scooters.  Crutches.  Turn on the lights when you go into a dark area. Replace any light bulbs as soon as they burn out.  Set up your furniture so you have a clear path. Avoid moving your furniture around.  If any of your floors are uneven, fix them.  If there are any pets around you, be aware of where they are.  Review your medicines with your doctor. Some medicines can make you feel dizzy. This can increase your chance of falling. Ask your doctor what other things that you can do to help prevent falls.   This information is not intended to replace advice given to you by your health care provider. Make sure you discuss any questions you have with your health care provider.   Document Released: 07/11/2009 Document Revised: 01/29/2015 Document Reviewed: 10/19/2014 Elsevier Interactive Patient Education [employer].

## 2016-06-23 NOTE — Assessment & Plan Note (Signed)
Patient currently taking 1500 Ius daily and slightly low. Given histor yof falls and osteoporosis, Advised to increase total supplementation to 2500 IUs D3 daily

## 2016-06-23 NOTE — Progress Notes (Signed)
  I have reviewed the above information and agree with above.   Jayvan Mcshan, MD 

## 2016-06-23 NOTE — Assessment & Plan Note (Signed)
Wound is overly dry due to current care practices.  Reassured patient that there is no sign of infection.  Advised to cover wound with sterile vaseline and non stick dressing,  Dressing applied by RN and tegaderm applied as well.

## 2016-06-23 NOTE — Assessment & Plan Note (Signed)
Explained to patient the difference between cellulitis and venous stasis changes.  Advised to use compression stockings

## 2016-06-23 NOTE — Assessment & Plan Note (Signed)
Recent infections and choice of antibiotics reviewed with patient.  Advised against repeat testing unless symptomatic.

## 2016-06-23 NOTE — Assessment & Plan Note (Signed)
Reviewed use of calcium and Vit D supplements.  Continue Prolia.  Next injection due in late February 2018.

## 2016-07-06 ENCOUNTER — Encounter: Payer: Self-pay | Admitting: Internal Medicine

## 2016-07-06 ENCOUNTER — Telehealth: Payer: Self-pay | Admitting: Internal Medicine

## 2016-07-06 DIAGNOSIS — K5909 Other constipation: Secondary | ICD-10-CM

## 2016-07-06 NOTE — Telephone Encounter (Signed)
Please schedule patient for Thursday at 11.30 and no sample needed,

## 2016-07-06 NOTE — Telephone Encounter (Signed)
Pt has been constipated since Sept 15th. She wants to see Dr. Derrel Nip and only Dr. Derrel Nip. She says when she does go it comes out in little ball with a lot of muscus. Pt does have a sample if Dr. Derrel Nip wants to send to lab. I tried to get pt to see another provider and she refused.

## 2016-07-07 NOTE — Telephone Encounter (Signed)
Called pt and left a vm to schedule appt for Friday

## 2016-07-13 ENCOUNTER — Ambulatory Visit (INDEPENDENT_AMBULATORY_CARE_PROVIDER_SITE_OTHER): Payer: Medicare Other | Admitting: Internal Medicine

## 2016-07-13 ENCOUNTER — Ambulatory Visit (INDEPENDENT_AMBULATORY_CARE_PROVIDER_SITE_OTHER): Payer: Medicare Other

## 2016-07-13 VITALS — BP 132/84 | HR 66 | Temp 98.4°F | Resp 15 | Wt 157.0 lb

## 2016-07-13 DIAGNOSIS — K591 Functional diarrhea: Secondary | ICD-10-CM | POA: Diagnosis not present

## 2016-07-13 DIAGNOSIS — K5909 Other constipation: Secondary | ICD-10-CM

## 2016-07-13 NOTE — Telephone Encounter (Signed)
On the schedule for tonight, thanks

## 2016-07-13 NOTE — Progress Notes (Signed)
Subjective:  Patient ID: Brenda Cardenas, female    DOB: 06/26/1932  Age: 82 y.o. MRN: [id number]  CC: The primary encounter diagnosis was Functional diarrhea. A diagnosis of Other constipation was also pertinent to this visit.  HPI Brenda Cardenas presents for persistent change in bowel habits for the past week .  Started 4 weeks ago after recovering form diarrhea after taking  abx for urinary tract infection.  Stools initially were small balls. , followed by passing small amounts of mucus. She Stools have been small and formed and brown   Has been taking miralax in the morning,  2 colace at night,  Which  She stopped on Saturday.  But on Saturday she felt very constipated,   Strained and didn't pass any stool other than a small one in the morning.  Had a normal stool this morning. Went to walk in Clinic on Thursday after having several  Loose bowels.   Did a bland  diet  Until yesterday,  Had a filet mignon  Yesterday  No nausea no abdominal pain except on Saturday and Sunday when she could not produce a bowel movement.     Outpatient Medications Prior to Visit  Medication Sig Dispense Refill  . acetaminophen (TYLENOL) 325 MG tablet Take 650 mg by mouth 2 (two) times daily. May take up to four  Per day/ 2 in the morning and 2 in the afternoon    . atenolol (TENORMIN) 25 MG tablet TAKE 1 TABLET EVERY DAY 90 tablet 1  . calcium citrate-vitamin D (CITRACAL+D) 315-200 MG-UNIT per tablet Take 2 tablets by mouth daily.    . Cholecalciferol (VITAMIN D3) 1000 units CAPS Take 1 capsule by mouth daily.    Marland Kitchen denosumab (PROLIA) 60 MG/ML SOLN injection Inject 60 mg into the skin every 6 (six) months. Administer in upper arm, thigh, or abdomen    . ezetimibe (ZETIA) 10 MG tablet TAKE 1 TABLET EVERY DAY 30 tablet 4  . ipratropium (ATROVENT) 0.06 % nasal spray 1 spray each side before each meal 15 mL 12  . Multiple Vitamins-Minerals (MULTIVITAMIN WITH MINERALS) tablet Take 1 tablet by mouth daily.    Marland Kitchen  omeprazole (PRILOSEC) 20 MG capsule Take 1 capsule (20 mg total) by mouth 2 (two) times daily before a meal. (Patient taking differently: Take 20 mg by mouth daily. ) 60 capsule 5  . Probiotic Product (ALIGN PO) Take by mouth daily.     No facility-administered medications prior to visit.     Review of Systems;  Patient denies headache, fevers, malaise, unintentional weight loss, skin rash, eye pain, sinus congestion and sinus pain, sore throat, dysphagia,  hemoptysis , cough, dyspnea, wheezing, chest pain, palpitations, orthopnea, edema, abdominal pain, nausea, melena, diarrhea, constipation, flank pain, dysuria, hematuria, urinary  Frequency, nocturia, numbness, tingling, seizures,  Focal weakness, Loss of consciousness,  Tremor, insomnia, depression, anxiety, and suicidal ideation.      Objective:  BP 132/84   Pulse 66   Temp 98.4 F (36.9 C) (Oral)   Resp 15   Wt 157 lb (71.2 kg)   SpO2 98%   BMI 25.34 kg/m   BP Readings from Last 3 Encounters:  07/13/16 132/84  06/23/16 120/68  06/22/16 128/70    Wt Readings from Last 3 Encounters:  07/13/16 157 lb (71.2 kg)  06/23/16 158 lb (71.7 kg)  06/22/16 158 lb (71.7 kg)    General appearance: alert, cooperative and appears stated age Ears: normal TM's and external ear canals both  ears Throat: lips, mucosa, and tongue normal; teeth and gums normal Neck: no adenopathy, no carotid bruit, supple, symmetrical, trachea midline and thyroid not enlarged, symmetric, no tenderness/mass/nodules Back: symmetric, no curvature. ROM normal. No CVA tenderness. Lungs: clear to auscultation bilaterally Heart: regular rate and rhythm, S1, S2 normal, no murmur, click, rub or gallop Abdomen: soft, non-tender; bowel sounds normal; no masses,  no organomegaly Pulses: 2+ and symmetric Skin: Skin color, texture, turgor normal. No rashes or lesions Lymph nodes: Cervical, supraclavicular, and axillary nodes normal.  No results found for:  HGBA1C  Lab Results  Component Value Date   CREATININE 0.95 07/13/2016   CREATININE 0.78 06/15/2016   CREATININE 0.73 12/16/2015    Lab Results  Component Value Date   WBC 11.2 (H) 07/13/2016   HGB 13.2 07/13/2016   HCT 39.5 07/13/2016   PLT 173.0 07/13/2016   GLUCOSE 90 07/13/2016   CHOL 185 12/16/2015   TRIG 115.0 12/16/2015   HDL 55.20 12/16/2015   LDLCALC 107 (H) 12/16/2015   ALT 13 07/13/2016   AST 18 07/13/2016   NA 138 07/13/2016   K 4.4 07/13/2016   CL 103 07/13/2016   CREATININE 0.95 07/13/2016   BUN 20 07/13/2016   CO2 28 07/13/2016   TSH 1.16 12/16/2015    No results found.  Assessment & Plan:   Problem List Items Addressed This Visit    Constipation    Altered bowel habits for the past 4 weeks, following antibiotic associated diarrhea .  Plain films done today ruled out ileus and SBO,   Continue miralax and colace.       Relevant Orders   Magnesium (Completed)   Comp Met (CMET) (Completed)    Other Visit Diagnoses    Functional diarrhea    -  Primary   Relevant Orders   CBC with Differential/Platelet (Completed)   Sedimentation rate (Completed)      I am having Ms. Pape maintain her calcium citrate-vitamin D, multivitamin with minerals, denosumab, acetaminophen, omeprazole, ipratropium, atenolol, ezetimibe, Probiotic Product (ALIGN PO), and Vitamin D3.  No orders of the defined types were placed in this encounter.   There are no discontinued medications.  Follow-up: No Follow-up on file.   Crecencio Mc, MD

## 2016-07-13 NOTE — Telephone Encounter (Signed)
The only appointment we could use is Thursday at 11.30.

## 2016-07-13 NOTE — Telephone Encounter (Signed)
Change appt to 6:30 tonight.  coming in for xrays fist.

## 2016-07-13 NOTE — Telephone Encounter (Signed)
Called and scheduled patient for 6.30 and patient coming in X-ray now.

## 2016-07-13 NOTE — Patient Instructions (Signed)
You are not impacted,  And your exam does NOT suggest diverticulitis  Continue using the 2 colace at night  Suspend the miralax until you hear from [place] in the morning   Don't  Strain to have a bowel movement   Keep diet bland for another night, Until you hear from [place] in the morning  (like your lunch today )

## 2016-07-13 NOTE — Telephone Encounter (Signed)
Pt is still having problems with her stool. She is going but there is a lot of mucus. Pt would like to see Dr. Derrel Nip this week. Pt is out of town all day Wednesday this week. Pt did a non-urgent MyChart message to Dr. Derrel Nip last night.

## 2016-07-14 ENCOUNTER — Encounter: Payer: Self-pay | Admitting: Internal Medicine

## 2016-07-14 DIAGNOSIS — K5900 Constipation, unspecified: Secondary | ICD-10-CM | POA: Insufficient documentation

## 2016-07-14 LAB — COMPREHENSIVE METABOLIC PANEL
ALBUMIN: 4.1 g/dL (ref 3.5–5.2)
ALK PHOS: 83 U/L (ref 39–117)
ALT: 13 U/L (ref 0–35)
AST: 18 U/L (ref 0–37)
BUN: 20 mg/dL (ref 6–23)
CHLORIDE: 103 meq/L (ref 96–112)
CO2: 28 mEq/L (ref 19–32)
CREATININE: 0.95 mg/dL (ref 0.40–1.20)
Calcium: 9.5 mg/dL (ref 8.4–10.5)
GFR: 59.77 mL/min — ABNORMAL LOW (ref >60.00)
Glucose, Bld: 90 mg/dL (ref 70–99)
Potassium: 4.4 mEq/L (ref 3.5–5.1)
SODIUM: 138 meq/L (ref 135–145)
TOTAL PROTEIN: 7 g/dL (ref 6.0–8.3)
Total Bilirubin: 0.5 mg/dL (ref 0.2–1.2)

## 2016-07-14 LAB — CBC WITH DIFFERENTIAL/PLATELET
BASOS ABS: 0 10*3/uL (ref 0.0–0.1)
Basophils Relative: 0.3 % (ref 0.0–3.0)
EOS ABS: 0.3 10*3/uL (ref 0.0–0.7)
Eosinophils Relative: 3 % (ref 0.0–5.0)
HEMATOCRIT: 39.5 % (ref 36.0–46.0)
HEMOGLOBIN: 13.2 g/dL (ref 12.0–15.0)
LYMPHS PCT: 23.1 % (ref 12.0–46.0)
Lymphs Abs: 2.6 10*3/uL (ref 0.7–4.0)
MCHC: 33.4 g/dL (ref 30.0–36.0)
MCV: 93.9 fl (ref 78.0–100.0)
Monocytes Absolute: 0.7 10*3/uL (ref 0.1–1.0)
Monocytes Relative: 6.3 % (ref 3.0–12.0)
Neutro Abs: 7.5 10*3/uL (ref 1.4–7.7)
Neutrophils Relative %: 67.3 % (ref 43.0–77.0)
Platelets: 173 10*3/uL (ref 150.0–400.0)
RBC: 4.21 Mil/uL (ref 3.87–5.11)
RDW: 16.3 % — ABNORMAL HIGH (ref 11.5–15.5)
WBC: 11.2 10*3/uL — AB (ref 4.0–10.5)

## 2016-07-14 LAB — SEDIMENTATION RATE: SED RATE: 12 mm/h (ref 0–30)

## 2016-07-14 LAB — MAGNESIUM: Magnesium: 2.2 mg/dL (ref 1.5–2.5)

## 2016-07-14 NOTE — Assessment & Plan Note (Signed)
Altered bowel habits for the past 4 weeks, following antibiotic associated diarrhea .  Plain films done today ruled out ileus and SBO,   Continue miralax and colace.

## 2016-07-15 ENCOUNTER — Telehealth: Payer: Self-pay | Admitting: Internal Medicine

## 2016-07-15 ENCOUNTER — Other Ambulatory Visit: Payer: Self-pay | Admitting: Internal Medicine

## 2016-07-15 ENCOUNTER — Encounter: Payer: Self-pay | Admitting: Internal Medicine

## 2016-07-15 DIAGNOSIS — N3090 Cystitis, unspecified without hematuria: Secondary | ICD-10-CM

## 2016-07-15 NOTE — Telephone Encounter (Signed)
Lab in please schedule in 1 month.

## 2016-07-15 NOTE — Telephone Encounter (Signed)
Pt called stating she was told to have the CBC repeated in one month. Please confirm before I schedule. Thank you!

## 2016-07-16 NOTE — Telephone Encounter (Signed)
Ok. Pt is scheduled. Thank you! °

## 2016-08-13 ENCOUNTER — Other Ambulatory Visit (INDEPENDENT_AMBULATORY_CARE_PROVIDER_SITE_OTHER): Payer: Medicare Other

## 2016-08-13 DIAGNOSIS — N3090 Cystitis, unspecified without hematuria: Secondary | ICD-10-CM | POA: Diagnosis not present

## 2016-08-13 LAB — CBC WITH DIFFERENTIAL/PLATELET
BASOS PCT: 1 % (ref 0.0–3.0)
Basophils Absolute: 0.1 10*3/uL (ref 0.0–0.1)
EOS PCT: 4.4 % (ref 0.0–5.0)
Eosinophils Absolute: 0.3 10*3/uL (ref 0.0–0.7)
HCT: 39 % (ref 36.0–46.0)
HEMOGLOBIN: 12.9 g/dL (ref 12.0–15.0)
Lymphocytes Relative: 23 % (ref 12.0–46.0)
Lymphs Abs: 1.8 10*3/uL (ref 0.7–4.0)
MCHC: 33.2 g/dL (ref 30.0–36.0)
MCV: 94.3 fl (ref 78.0–100.0)
MONO ABS: 0.5 10*3/uL (ref 0.1–1.0)
MONOS PCT: 7 % (ref 3.0–12.0)
Neutro Abs: 5 10*3/uL (ref 1.4–7.7)
Neutrophils Relative %: 64.6 % (ref 43.0–77.0)
Platelets: 186 10*3/uL (ref 150.0–400.0)
RBC: 4.14 Mil/uL (ref 3.87–5.11)
RDW: 17.2 % — AB (ref 11.5–15.5)
WBC: 7.8 10*3/uL (ref 4.0–10.5)

## 2016-08-16 ENCOUNTER — Encounter: Payer: Self-pay | Admitting: Internal Medicine

## 2016-08-27 ENCOUNTER — Encounter: Payer: Self-pay | Admitting: Podiatry

## 2016-08-27 ENCOUNTER — Ambulatory Visit (INDEPENDENT_AMBULATORY_CARE_PROVIDER_SITE_OTHER): Payer: Medicare Other | Admitting: Podiatry

## 2016-08-27 DIAGNOSIS — M25572 Pain in left ankle and joints of left foot: Secondary | ICD-10-CM

## 2016-08-27 DIAGNOSIS — M722 Plantar fascial fibromatosis: Secondary | ICD-10-CM

## 2016-08-27 DIAGNOSIS — L600 Ingrowing nail: Secondary | ICD-10-CM | POA: Diagnosis not present

## 2016-08-27 DIAGNOSIS — M779 Enthesopathy, unspecified: Secondary | ICD-10-CM | POA: Diagnosis not present

## 2016-08-27 NOTE — Progress Notes (Signed)
Patient ID: Brenda Cardenas, female   DOB: 12-14-1931, 82 y.o.   MRN: [id number]  Subjective: Brenda Cardenas  presents today for follow-up evaluation of left foot and ankle pain. She presents today for an unscheduled appointment she is requesting injections to her left foot. She also has an ingrown toenail left big toe due to the toenails trimmed. She denies any redness or drainage or any swelling or any other signs of infection. She is continue with ankle brace which is been doing very well for her left ankle. She is also been wearing a Photographer which is been helping. She has no recent injury or trauma. No other complaints at this time.  Objective: AAO 3, NAD Neurovascular status unchanged. There is tenderness palpation along the plantar aspect of the left calcaneus at the insertion the plantar fascia. There is no pain with lateral compression of the calcaneus or with vibratory sensation. No pain on the posterior aspect of the calcaneus. There is no pain on the course of plantar fascial in the arch of the foot.  There is a significant decrease in medial arch height upon weightbearing with medial prominences. Hammertoes are present There is continued pain on the lateral aspect of the foot overlying the sinus tarsi. There is discomfort with subtalar joint range of motion and is restricted. There is no crepitation.  There are no other areas of pinpoint bony tenderness or pain with vibratory sensation on the foot or ankle. There is no overlying edema or any erythema or increase in warmth. Hammertoes are present. On the left hallux toenail on the medial nail border is mild incurvation of the tenderness the distal medial portion of the nail. There is no edema, erythema, drainage or pus or any signs of infection. No open lesions or other pre-ulcer lesions identified No pain with calf compression, swelling, warmth, erythema. Evaluation of the brace reveals normal wear patterns the bottom of the  brace.  Assessment:  82 year old female with sinus tarsi pain, heel pain; due to significant flatfoot; ingrown toenail left hallux  Plan: -Treatment options discussed including all alternatives, risks, and complications -Patient elects to proceed with steroid injection into the left heel. Under sterile skin preparation, a total of 2.5cc of kenalog 10, 0.5% Marcaine plain, and 2% lidocaine plain were infiltrated into the symptomatic area without complication. A band-aid was applied. Patient tolerated the injection well without complication. Post-injection care with discussed with the patient. Discussed with the patient to ice the area over the next couple of days to help prevent a steroid flare.  -She also is requesting steroid injection in the sinus tarsi. I discussed risks, occasions for which she understands verbally consents. Under sterile conditions a total of 1 mL of a mixture of Kenalog 10, 2% lidocaine plain was infiltrated into the lateral aspect of the foot over the area of maximal tenderness on the sinus tarsi. She tolerated injection well without any complications. Post injection care was discussed. -Discussed partial nail avulsion but she wishes to hold off. I debrided the left hallux toenail removed the symptomatic portion of the ingrown toenail without any complications or bleeding. -Follow-up as needed. In the meantime encouraged to call the office with any questions, concerns, change in symptoms.  Celesta Gentile, DPM

## 2016-08-28 ENCOUNTER — Other Ambulatory Visit: Payer: Self-pay | Admitting: Internal Medicine

## 2016-09-07 ENCOUNTER — Other Ambulatory Visit: Payer: Self-pay | Admitting: Internal Medicine

## 2016-10-01 ENCOUNTER — Encounter: Payer: Self-pay | Admitting: Internal Medicine

## 2016-10-29 ENCOUNTER — Encounter: Payer: Self-pay | Admitting: Podiatry

## 2016-10-29 ENCOUNTER — Ambulatory Visit (INDEPENDENT_AMBULATORY_CARE_PROVIDER_SITE_OTHER): Payer: Medicare Other | Admitting: Podiatry

## 2016-10-29 DIAGNOSIS — M79674 Pain in right toe(s): Secondary | ICD-10-CM | POA: Diagnosis not present

## 2016-10-29 DIAGNOSIS — Q828 Other specified congenital malformations of skin: Secondary | ICD-10-CM

## 2016-10-29 DIAGNOSIS — B351 Tinea unguium: Secondary | ICD-10-CM

## 2016-10-29 DIAGNOSIS — M79675 Pain in left toe(s): Secondary | ICD-10-CM

## 2016-10-29 NOTE — Patient Instructions (Signed)

## 2016-10-29 NOTE — Progress Notes (Signed)
Subjective: D50-year-old female presents the office today requesting that her toenails be trimmed as they're elongated and thick and she cannot trim them herself. She also has calluses to both of her feet which are painful with pressure. She states that she went to get a pedicure done and on her left big toe and second toenail on the arch of the nail to cut the skin and has been sore since. She denies any swelling or redness or any drainage from the area or any other signs of infection. Denies any systemic complaints such as fevers, chills, nausea, vomiting. No acute changes since last appointment, and no other complaints at this time.   Objective: AAO x3, NAD DP/PT pulses palpable bilaterally, CRT less than 3 seconds Nails appear to be dystrophic, discolored, hypertrophic, elongated 10. There is no surrounding redness or drainage. The distal aspect the left second toe with hyperkeratotic lesion with dry blood under the callus. This is from where she was cut previously. Upon debridement there is no underlying ulceration, drainage or any signs of infection. There is also hyperkeratotic lesions bilateral medial MPJs. Also mild hyperkeratotic lesion left submetatarsal 2. There is no underlying ulceration from these areas. Hammertoe contractures are present as well as flatfoot deformity. This prominence the metatarsal head laterally and atrophy of the fat pad. No open lesions or pre-ulcerative lesions.  No pain with calf compression, swelling, warmth, erythema  Assessment: Symptomatic onychomycosis, pre-ulcerative calluses 4  Plan: -All treatment options discussed with the patient including all alternatives, risks, complications.  -Nails are sharply debrided 10 without complications or bleeding. -Hyperkeratotic lesions were debrided 4  without complications or bleeding. -Multiple offloading pads were dispensed. -She wishes to come in every 6 weeks of the nails trimmed. -Patient encouraged to call  the office with any questions, concerns, change in symptoms.   Celesta Gentile, DPM

## 2016-11-19 ENCOUNTER — Telehealth: Payer: Self-pay | Admitting: *Deleted

## 2016-11-19 NOTE — Telephone Encounter (Signed)
Rec fax stating Prolia is approved 11/11/16 - 11/11/2017.

## 2016-11-24 ENCOUNTER — Ambulatory Visit (INDEPENDENT_AMBULATORY_CARE_PROVIDER_SITE_OTHER): Payer: Medicare Other

## 2016-11-24 DIAGNOSIS — M810 Age-related osteoporosis without current pathological fracture: Secondary | ICD-10-CM

## 2016-11-24 MED ORDER — DENOSUMAB 60 MG/ML ~~LOC~~ SOLN
60.0000 mg | Freq: Once | SUBCUTANEOUS | Status: AC
  Administered 2021-10-13: 60 mg via SUBCUTANEOUS

## 2016-11-24 NOTE — Progress Notes (Addendum)
Patient presented for Prolia injection.  Administered R arm, subcutaneous per patient request.  Tolerated well.  Verbalized understanding and given educational material to call in July for next dose due in August.     Reviewed.  Dr Nicki Reaper

## 2016-11-29 ENCOUNTER — Encounter: Payer: Self-pay | Admitting: Internal Medicine

## 2016-12-01 ENCOUNTER — Other Ambulatory Visit: Payer: Self-pay | Admitting: Internal Medicine

## 2016-12-10 ENCOUNTER — Ambulatory Visit: Payer: Medicare Other | Admitting: Podiatry

## 2016-12-14 ENCOUNTER — Other Ambulatory Visit (INDEPENDENT_AMBULATORY_CARE_PROVIDER_SITE_OTHER): Payer: Medicare Other

## 2016-12-14 DIAGNOSIS — E785 Hyperlipidemia, unspecified: Secondary | ICD-10-CM

## 2016-12-14 DIAGNOSIS — R5382 Chronic fatigue, unspecified: Secondary | ICD-10-CM | POA: Diagnosis not present

## 2016-12-14 DIAGNOSIS — E559 Vitamin D deficiency, unspecified: Secondary | ICD-10-CM

## 2016-12-14 LAB — LIPID PANEL
Cholesterol: 180 mg/dL (ref 0–200)
HDL: 55.5 mg/dL (ref >39.00)
LDL Cholesterol: 107 mg/dL — ABNORMAL HIGH (ref 0–99)
NonHDL: 124.82
TRIGLYCERIDES: 87 mg/dL (ref 0.0–149.0)
Total CHOL/HDL Ratio: 3
VLDL: 17.4 mg/dL (ref 0.0–40.0)

## 2016-12-14 LAB — TSH: TSH: 0.78 u[IU]/mL (ref 0.35–4.50)

## 2016-12-14 LAB — VITAMIN D 25 HYDROXY (VIT D DEFICIENCY, FRACTURES): VITD: 34.11 ng/mL (ref 30.00–100.00)

## 2016-12-15 ENCOUNTER — Encounter: Payer: Self-pay | Admitting: Internal Medicine

## 2016-12-15 DIAGNOSIS — R300 Dysuria: Secondary | ICD-10-CM

## 2016-12-16 ENCOUNTER — Encounter: Payer: Self-pay | Admitting: Internal Medicine

## 2016-12-16 ENCOUNTER — Other Ambulatory Visit: Payer: Self-pay | Admitting: Internal Medicine

## 2016-12-16 ENCOUNTER — Other Ambulatory Visit (INDEPENDENT_AMBULATORY_CARE_PROVIDER_SITE_OTHER): Payer: Medicare Other

## 2016-12-16 DIAGNOSIS — R300 Dysuria: Secondary | ICD-10-CM

## 2016-12-16 LAB — URINALYSIS, ROUTINE W REFLEX MICROSCOPIC
BILIRUBIN URINE: NEGATIVE
Ketones, ur: NEGATIVE
Nitrite: POSITIVE — AB
PH: 5.5 (ref 5.0–8.0)
Specific Gravity, Urine: 1.02 (ref 1.000–1.030)
Urine Glucose: NEGATIVE
Urobilinogen, UA: 0.2 (ref 0.0–1.0)

## 2016-12-16 MED ORDER — CIPROFLOXACIN HCL 250 MG PO TABS: 250.0000 mg | ORAL_TABLET | Freq: Two times a day (BID) | ORAL | 0 refills | Status: DC

## 2016-12-16 NOTE — Telephone Encounter (Signed)
Reason for call: UTI Symptoms:urinary frequency. Dysuria after urination  low back pain , no fever  Duration 2 days Medications: History of kidney stones, history of UTI Can she come in and leave urine specimen

## 2016-12-16 NOTE — Telephone Encounter (Signed)
Per verbal orders from Dr Derrel Nip ok for patient to come in for urinalysis , urine micro and urine culture.  Patient scheduled for lab appointment.

## 2016-12-17 ENCOUNTER — Encounter: Payer: Self-pay | Admitting: Internal Medicine

## 2016-12-18 ENCOUNTER — Encounter: Payer: Self-pay | Admitting: Internal Medicine

## 2016-12-18 LAB — URINE CULTURE

## 2016-12-21 ENCOUNTER — Telehealth: Payer: Self-pay | Admitting: *Deleted

## 2016-12-21 ENCOUNTER — Encounter: Payer: Self-pay | Admitting: Internal Medicine

## 2016-12-21 ENCOUNTER — Ambulatory Visit (INDEPENDENT_AMBULATORY_CARE_PROVIDER_SITE_OTHER): Payer: Medicare Other | Admitting: Internal Medicine

## 2016-12-21 VITALS — BP 106/70 | HR 61 | Temp 97.8°F | Resp 16 | Ht 66.0 in | Wt 158.0 lb

## 2016-12-21 DIAGNOSIS — E559 Vitamin D deficiency, unspecified: Secondary | ICD-10-CM

## 2016-12-21 DIAGNOSIS — M545 Low back pain, unspecified: Secondary | ICD-10-CM

## 2016-12-21 DIAGNOSIS — I10 Essential (primary) hypertension: Secondary | ICD-10-CM

## 2016-12-21 DIAGNOSIS — E785 Hyperlipidemia, unspecified: Secondary | ICD-10-CM

## 2016-12-21 DIAGNOSIS — G8929 Other chronic pain: Secondary | ICD-10-CM

## 2016-12-21 DIAGNOSIS — N3090 Cystitis, unspecified without hematuria: Secondary | ICD-10-CM | POA: Diagnosis not present

## 2016-12-21 DIAGNOSIS — E663 Overweight: Secondary | ICD-10-CM | POA: Diagnosis not present

## 2016-12-21 DIAGNOSIS — R29898 Other symptoms and signs involving the musculoskeletal system: Secondary | ICD-10-CM | POA: Diagnosis not present

## 2016-12-21 DIAGNOSIS — M810 Age-related osteoporosis without current pathological fracture: Secondary | ICD-10-CM

## 2016-12-21 DIAGNOSIS — R7301 Impaired fasting glucose: Secondary | ICD-10-CM

## 2016-12-21 NOTE — Telephone Encounter (Signed)
Patient requested to know if she will need labs prior to her appt in September  Pt contact [phone]

## 2016-12-21 NOTE — Progress Notes (Signed)
Pre visit review using our clinic review tool, if applicable. No additional management support is needed unless otherwise documented below in the visit note. 

## 2016-12-21 NOTE — Patient Instructions (Addendum)
1)You may be under treating your pain:   You can take up to 2000  Mg tylenol every day safely.  It will not harm youR liver     I will make a PT referral for core strengthening to help you get out of a chair   2)  Start taking cranberry tablets daily  To prevent UTI's    3) If you are going to have daily ice cream, try some of the low carb and low cal choices:  [employer] carb smart ice cream bars Skinny cow products

## 2016-12-21 NOTE — Telephone Encounter (Signed)
Please advise 

## 2016-12-21 NOTE — Progress Notes (Signed)
Subjective:  Patient ID: Brenda Cardenas, female    DOB: Dec 21, 1931  Age: 83 y.o. MRN: [id number]  CC: The primary encounter diagnosis was Weakness of both lower limbs. Diagnoses of Recurrent cystitis, Chronic right-sided low back pain without sciatica, Hypovitaminosis D, Essential hypertension, benign, Age related osteoporosis, unspecified pathological fracture presence, and Overweight were also pertinent to this visit.  HPI Brenda Cardenas presents for follow up on chronic conditions  Recent UTI: UTI symptoms  started with frequency and burning late at night .  Requested empiric treatment after submitting a urine sample without an appointment.  Empiric treatment with cipro given.  symptoms improved after  2 days of  Cipro, and  Culture confirmed  Sensitivity of E Coli to cipro  Patient concerned about frequency of UTIs .  History reviewed: E Coli UTI sept 2017, June 2017.  No history of incontinence.   Does not swim in pools. Discussed trial of cranberry tablets followed by vaginal  estrogen    2) Saw [facility] on April 10 for hip pain radiating from Lateral femur down to the knee.  Has worsening scoliosis by repeat films last year.  Periodically having bursitis transiently relieved with steroid injections.    3) Knee pain on the left : told she has no cartilage,  Pain in lateral  Popliteal area.   Has to lift lieg to put parking brake on .   Taking 2 tylenol in th eam,  q 8 hours prn  4) Wearing the foot brace prescribed by [facility] him  for collapsed arch and plantar fasciitis .  No recent falls  Trouble getting out of a chair without using hands .  Using Nustep   Taking calcium citrate 2 daily plus 1000 iUsof D3  daily   stoill seeing pain clinic for spinal stenosis, of neck and lumbar spine .  Last trigger spot injection     Outpatient Medications Prior to Visit  Medication Sig Dispense Refill  . acetaminophen (TYLENOL) 325 MG tablet Take 650 mg by mouth 2 (two) times daily. May  take up to four  Per day/ 2 in the morning and 2 in the afternoon    . atenolol (TENORMIN) 25 MG tablet TAKE 1 TABLET BY MOUTH DAILY 90 tablet 1  . calcium citrate-vitamin D (CITRACAL+D) 315-200 MG-UNIT per tablet Take 2 tablets by mouth daily.    . Cholecalciferol (VITAMIN D3) 1000 units CAPS Take 1 capsule by mouth daily.    . ciprofloxacin (CIPRO) 250 MG tablet Take 1 tablet (250 mg total) by mouth 2 (two) times daily. 10 tablet 0  . denosumab (PROLIA) 60 MG/ML SOLN injection Inject 60 mg into the skin every 6 (six) months. Administer in upper arm, thigh, or abdomen    . ezetimibe (ZETIA) 10 MG tablet TAKE 1 TABLET BY MOUTH DAILY 30 tablet 5  . ipratropium (ATROVENT) 0.06 % nasal spray 1 spray each side before each meal 15 mL 12  . Multiple Vitamins-Minerals (MULTIVITAMIN WITH MINERALS) tablet Take 1 tablet by mouth daily.    Marland Kitchen omeprazole (PRILOSEC) 20 MG capsule TAKE ONE CAPSULE TWICE A DAY 30 TO 60 MINUTES BEFORE MEALS 60 capsule 2  . Probiotic Product (ALIGN PO) Take by mouth daily.     Facility-Administered Medications Prior to Visit  Medication Dose Route Frequency Provider Last Rate Last Dose  . denosumab (PROLIA) injection 60 mg  60 mg Subcutaneous Once Einar Pheasant, MD        Review of Systems;  Patient denies headache,  fevers, malaise, unintentional weight loss, skin rash, eye pain, sinus congestion and sinus pain, sore throat, dysphagia,  hemoptysis , cough, dyspnea, wheezing, chest pain, palpitations, orthopnea, edema, abdominal pain, nausea, melena, diarrhea, constipation, flank pain, dysuria, hematuria, urinary  Frequency, nocturia, numbness, tingling, seizures,  Focal weakness, Loss of consciousness,  Tremor, insomnia, depression, anxiety, and suicidal ideation.      Objective:  BP 106/70 (BP Location: Left Arm, Patient Position: Sitting, Cuff Size: Normal)   Pulse 61   Temp 97.8 F (36.6 C) (Oral)   Resp 16   Ht 5\' 6"  (1.676 m)   Wt 158 lb (71.7 kg)   SpO2 99%    BMI 25.50 kg/m   BP Readings from Last 3 Encounters:  12/21/16 106/70  07/13/16 132/84  06/23/16 120/68    Wt Readings from Last 3 Encounters:  12/21/16 158 lb (71.7 kg)  07/13/16 157 lb (71.2 kg)  06/23/16 158 lb (71.7 kg)    General appearance: alert, cooperative and appears stated age Ears: normal TM's and external ear canals both ears Throat: lips, mucosa, and tongue normal; teeth and gums normal Neck: no adenopathy, no carotid bruit, supple, symmetrical, trachea midline and thyroid not enlarged, symmetric, no tenderness/mass/nodules Back: symmetric, no curvature. ROM normal. No CVA tenderness. Lungs: clear to auscultation bilaterally Heart: regular rate and rhythm, S1, S2 normal, no murmur, click, rub or gallop Abdomen: soft, non-tender; bowel sounds normal; no masses,  no organomegaly Pulses: 2+ and symmetric Skin: Skin color, texture, turgor normal. No rashes or lesions Lymph nodes: Cervical, supraclavicular, and axillary nodes normal.  No results found for: HGBA1C  Lab Results  Component Value Date   CREATININE 0.95 07/13/2016   CREATININE 0.78 06/15/2016   CREATININE 0.73 12/16/2015    Lab Results  Component Value Date   WBC 7.8 08/13/2016   HGB 12.9 08/13/2016   HCT 39.0 08/13/2016   PLT 186.0 08/13/2016   GLUCOSE 90 07/13/2016   CHOL 180 12/14/2016   TRIG 87.0 12/14/2016   HDL 55.50 12/14/2016   LDLCALC 107 (H) 12/14/2016   ALT 13 07/13/2016   AST 18 07/13/2016   NA 138 07/13/2016   K 4.4 07/13/2016   CL 103 07/13/2016   CREATININE 0.95 07/13/2016   BUN 20 07/13/2016   CO2 28 07/13/2016   TSH 0.78 12/14/2016    No results found.  Assessment & Plan:   Problem List Items Addressed This Visit    Essential hypertension, benign    Well controlled on current regimen. Renal function has been normal, no changes today.  Lab Results  Component Value Date   CREATININE 0.95 07/13/2016   Lab Results  Component Value Date   NA 138 07/13/2016   K  4.4 07/13/2016   CL 103 07/13/2016   CO2 28 07/13/2016         Hypovitaminosis D    Normalized, Now managed with OTC supplements.      Osteoporosis    Reviewed use of calcium and Vit D supplements.  Continue Prolia.  Next injection due in late August 2018.  Last dexa in 2017 showed improved T scores compared to 20`4 [facility])       Overweight    I have addressed  BMI and recommended a lower glycemic index diet utilizing smaller more frequent meals to increase metabolism and less ice cream. .  I have also recommended that patient start exercising with a goal of 30 minutes of aerobic exercise a minimum of 5 days per week.  Screening for lipid disorders, thyroid and diabetes to be done today. Lab Results  Component Value Date   TSH 0.78 12/14/2016   Lab Results  Component Value Date   CHOL 180 12/14/2016   HDL 55.50 12/14/2016   LDLCALC 107 (H) 12/14/2016   TRIG 87.0 12/14/2016   CHOLHDL 3 12/14/2016           Recurrent cystitis    Her recent E Coli UTI is her third since June .  Discussed trial of cranberry tablets daily, history of vaginal bleeding after one dose of vaginal estrogen,  So not likely to retry.  Will refer to Urology if recurrent      Right-sided low back pain without sciatica    Analgesic regimen outllined (non narcotic).  PT for strengthening as she hs developed gluteal weakness        Other Visit Diagnoses    Weakness of both lower limbs    -  Primary   Relevant Orders   Ambulatory referral to Physical Therapy    A total of 40 minutes was spent with patient more than half of which was spent in counseling patient on the above mentioned issues , reviewing and explaining recent labs and imaging studies done, and coordination of care.  I am having Ms. Hobby maintain her calcium citrate-vitamin D, multivitamin with minerals, denosumab, acetaminophen, ipratropium, Probiotic Product (ALIGN PO), Vitamin D3, omeprazole, atenolol, ezetimibe, and  ciprofloxacin. We will continue to administer denosumab.  No orders of the defined types were placed in this encounter.   There are no discontinued medications.  Follow-up: Return in about 6 months (around 06/23/2017).   Crecencio Mc, MD

## 2016-12-22 NOTE — Assessment & Plan Note (Addendum)
Normalized, Now managed with OTC supplements.

## 2016-12-22 NOTE — Telephone Encounter (Signed)
Fasting labs ordered

## 2016-12-22 NOTE — Assessment & Plan Note (Signed)
Reviewed use of calcium and Vit D supplements.  Continue Prolia.  Next injection due in late August 2018.  Last dexa in 2017 showed improved T scores compared to 20`4 [facility])

## 2016-12-22 NOTE — Assessment & Plan Note (Signed)
Her recent E Coli UTI is her third since June .  Discussed trial of cranberry tablets daily, history of vaginal bleeding after one dose of vaginal estrogen,  So not likely to retry.  Will refer to Urology if recurrent

## 2016-12-22 NOTE — Assessment & Plan Note (Signed)
Well controlled on current regimen. Renal function has been normal, no changes today.  Lab Results  Component Value Date   CREATININE 0.95 07/13/2016   Lab Results  Component Value Date   NA 138 07/13/2016   K 4.4 07/13/2016   CL 103 07/13/2016   CO2 28 07/13/2016

## 2016-12-22 NOTE — Assessment & Plan Note (Signed)
I have addressed  BMI and recommended a lower glycemic index diet utilizing smaller more frequent meals to increase metabolism and less ice cream. .  I have also recommended that patient start exercising with a goal of 30 minutes of aerobic exercise a minimum of 5 days per week. Screening for lipid disorders, thyroid and diabetes to be done today. Lab Results  Component Value Date   TSH 0.78 12/14/2016   Lab Results  Component Value Date   CHOL 180 12/14/2016   HDL 55.50 12/14/2016   LDLCALC 107 (H) 12/14/2016   TRIG 87.0 12/14/2016   CHOLHDL 3 12/14/2016

## 2016-12-22 NOTE — Assessment & Plan Note (Signed)
Analgesic regimen outllined (non narcotic).  PT for strengthening as she hs developed gluteal weakness

## 2016-12-23 NOTE — Telephone Encounter (Signed)
Patient has been scheduled

## 2016-12-23 NOTE — Telephone Encounter (Signed)
Left message to call.

## 2017-01-11 ENCOUNTER — Ambulatory Visit (INDEPENDENT_AMBULATORY_CARE_PROVIDER_SITE_OTHER): Payer: Medicare Other | Admitting: Podiatry

## 2017-01-11 ENCOUNTER — Encounter: Payer: Self-pay | Admitting: Podiatry

## 2017-01-11 DIAGNOSIS — M25572 Pain in left ankle and joints of left foot: Secondary | ICD-10-CM

## 2017-01-11 DIAGNOSIS — M779 Enthesopathy, unspecified: Secondary | ICD-10-CM

## 2017-01-11 DIAGNOSIS — M722 Plantar fascial fibromatosis: Secondary | ICD-10-CM

## 2017-01-13 ENCOUNTER — Other Ambulatory Visit: Payer: Self-pay | Admitting: Internal Medicine

## 2017-01-13 NOTE — Progress Notes (Signed)
Patient ID: Anureet Bruington, female   DOB: Feb 17, 1932, 83 y.o.   MRN: [id number]  Subjective: [name]  presents today for follow-up evaluation of left foot and ankle pain. She presents today requesting injections to her left foot.She is continue with ankle brace which is been doing very well for her left ankle and is requesting another one today.  She has no recent injury or trauma. No other complaints at this time.  Objective: AAO 3, NAD Neurovascular status unchanged. There is tenderness palpation along the plantar aspect of the left calcaneus at the insertion the plantar fascia. There is no pain with lateral compression of the calcaneus or with vibratory sensation. No pain on the posterior aspect of the calcaneus. There is no pain on the course of plantar fascial in the arch of the foot.  There is a significant decrease in medial arch height upon weightbearing with medial prominences. Hammertoes are present There is continued pain on the lateral aspect of the foot overlying the sinus tarsi. There is discomfort with subtalar joint range of motion and is restricted. There is no crepitation.  There are no other areas of pinpoint bony tenderness or pain with vibratory sensation on the foot or ankle. There is no overlying edema or any erythema or increase in warmth. Hammertoes are present. No open lesions or other pre-ulcer lesions identified No pain with calf compression, swelling, warmth, erythema. Evaluation of the brace reveals normal wear patterns the bottom of the brace.  Assessment:  83 year old female with sinus tarsi pain, heel pain; due to significant flatfoot  Plan: -Treatment options discussed including all alternatives, risks, and complications -Patient elects to proceed with steroid injection into the left heel. Under sterile skin preparation, a total of 2.5cc of kenalog 10, 0.5% Marcaine plain, and 2% lidocaine plain were infiltrated into the symptomatic area without complication. A  band-aid was applied. Patient tolerated the injection well without complication. Post-injection care with discussed with the patient. Discussed with the patient to ice the area over the next couple of days to help prevent a steroid flare.  -She also is requesting steroid injection in the sinus tarsi. I discussed risks, occasions for which she understands verbally consents. Under sterile conditions a total of 1 mL of a mixture of Kenalog 10, 2% lidocaine plain was infiltrated into the lateral aspect of the foot over the area of maximal tenderness on the sinus tarsi. She tolerated injection well without any complications. Post injection care was discussed. -New ankle brace was dispensed.  -Follow-up as needed. In the meantime encouraged to call the office with any questions, concerns, change in symptoms.  Celesta Gentile, DPM

## 2017-01-21 ENCOUNTER — Ambulatory Visit (INDEPENDENT_AMBULATORY_CARE_PROVIDER_SITE_OTHER): Payer: Medicare Other | Admitting: Podiatry

## 2017-01-21 DIAGNOSIS — L84 Corns and callosities: Secondary | ICD-10-CM

## 2017-01-21 DIAGNOSIS — M205X9 Other deformities of toe(s) (acquired), unspecified foot: Secondary | ICD-10-CM

## 2017-01-21 NOTE — Progress Notes (Signed)
This patient presents to the office today stating she's having extreme and severe pain in the third toe, left foot. She was seen by Dr. Earleen Newport a week ago and he provided her with injection therapy. She walks with an with an ankle brace and a brace on her foot for her plantar fasciitis. She states she is now experiencing extreme throbbing in pain noted in the third toe of the left foot. This pain is severe at night. She has provided no self treatment, says that Dr. Jacqualyn Posey only minimally debrided the corn. She says she has tried multiple types of padding with no benefit. She presents today for an evaluation and treatment of this painful third toe  GENERAL APPEARANCE: Alert, conversant. Appropriately groomed. No acute distress.  VASCULAR: Pedal pulses are  palpable at  [facility] and PT bilateral.  Capillary refill time is immediate to all digits,  Normal temperature gradient.   NEUROLOGIC: sensation is normal to 5.07 monofilament at 5/5 sites bilateral.  Light touch is intact bilateral, Muscle strength normal.  MUSCULOSKELETAL: acceptable muscle strength, tone and stability bilateral.  Intrinsic muscluature intact bilateral.  Severe contractures 2-5 digits  B/L.  Pes planus.  DERMATOLOGIC: skin color, texture, and turgor are within normal limits.  No preulcerative lesions or ulcers  are seen, no interdigital maceration noted.  No open lesions present.  Digital nails are asymptomatic. No drainage noted. Painful corn on the dorsolateral aspect 3rd toe at DIPJ.   Inflamed corn secondary to mallet toe third toe left foot.  Debridement of corn.  Patient was told she would benefit from shoes that applied no pressure to her toe. She then explained that she needed to wear the shoes due to the fact that she is wearing braces for her flatfeet. She is to return the office to see Dr. Salem Caster DPM

## 2017-01-29 ENCOUNTER — Ambulatory Visit (INDEPENDENT_AMBULATORY_CARE_PROVIDER_SITE_OTHER): Payer: Medicare Other | Admitting: Vascular Surgery

## 2017-02-08 ENCOUNTER — Telehealth: Payer: Self-pay

## 2017-02-08 ENCOUNTER — Encounter: Payer: Self-pay | Admitting: Internal Medicine

## 2017-02-08 NOTE — Telephone Encounter (Signed)
Pt has not seen Dr A since 7/17 pt is complaining of back pain and hop pain the goes down her legs. Also pt says she is seeing a PT and its not helping pt is walking with cain and still can't barely walk.

## 2017-02-09 NOTE — Telephone Encounter (Signed)
She does not need authorization for procedures.Marland Kitchen

## 2017-02-09 NOTE — Telephone Encounter (Signed)
Patient came in today to see if she could be worked in to the sched for some trigger points and would like a call back - will be out of town until Friday / is having back and leg pain, wants procedure in both

## 2017-02-16 ENCOUNTER — Ambulatory Visit (INDEPENDENT_AMBULATORY_CARE_PROVIDER_SITE_OTHER): Payer: Medicare Other | Admitting: Vascular Surgery

## 2017-02-16 ENCOUNTER — Telehealth: Payer: Self-pay | Admitting: *Deleted

## 2017-02-16 ENCOUNTER — Encounter (INDEPENDENT_AMBULATORY_CARE_PROVIDER_SITE_OTHER): Payer: Self-pay | Admitting: Vascular Surgery

## 2017-02-16 VITALS — BP 122/74 | HR 71 | Resp 15 | Ht 66.0 in | Wt 159.0 lb

## 2017-02-16 DIAGNOSIS — I10 Essential (primary) hypertension: Secondary | ICD-10-CM

## 2017-02-16 DIAGNOSIS — M79604 Pain in right leg: Secondary | ICD-10-CM | POA: Diagnosis not present

## 2017-02-16 DIAGNOSIS — M79605 Pain in left leg: Secondary | ICD-10-CM

## 2017-02-16 DIAGNOSIS — I878 Other specified disorders of veins: Secondary | ICD-10-CM | POA: Diagnosis not present

## 2017-02-16 DIAGNOSIS — M79609 Pain in unspecified limb: Secondary | ICD-10-CM | POA: Insufficient documentation

## 2017-02-16 NOTE — Assessment & Plan Note (Signed)
Reasonably stable. No new ulceration or infection. Her residual varicose veins or superficial and reasonably mild to moderate in nature. No intervention currently planned. Plan to recheck in 1 year.

## 2017-02-16 NOTE — Progress Notes (Signed)
MRN : [id number]  Brenda Cardenas is a 83 y.o. (Nov 04, 1931) female who presents with chief complaint of  Chief Complaint  Patient presents with  . Re-evaluation    1 year no studies  .  History of Present Illness: Patient returns in follow up for venous disease. Undergone multiple previous treatments for venous disease of the left leg. He reports her veins have actually been pretty good. She's had some issues with bursitis in her right hip and a good number of orthopedic issues which she deals with on a daily basis. She has no new ulceration or infection. Her swelling is reasonably mild..    Past Medical History:  Diagnosis Date  . Arthritis   . Chronic kidney disease   . Colon polyps   . GERD (gastroesophageal reflux disease)   . Hyperlipidemia   . Hypertension     Past Surgical History:  Procedure Laterality Date  . APPENDECTOMY  1959  . CHOLECYSTECTOMY  1995  . JOINT REPLACEMENT Right July 2009   [facility]   . TONSILLECTOMY AND ADENOIDECTOMY      Social History Social History  Substance Use Topics  . Smoking status: Never Smoker  . Smokeless tobacco: Never Used  . Alcohol use No    Family History Family History  Problem Relation Age of Onset  . Heart disease Mother   . Arthritis Mother   . Diabetes Father     Current Outpatient Prescriptions  Medication Sig Dispense Refill  . acetaminophen (TYLENOL) 325 MG tablet Take 650 mg by mouth 2 (two) times daily. May take up to four  Per day/ 2 in the morning and 2 in the afternoon    . atenolol (TENORMIN) 25 MG tablet TAKE 1 TABLET BY MOUTH DAILY 90 tablet 1  . calcium citrate-vitamin D (CITRACAL+D) 315-200 MG-UNIT per tablet Take 2 tablets by mouth daily.    . Cholecalciferol (VITAMIN D3) 1000 units CAPS Take 1 capsule by mouth daily.    Marland Kitchen ezetimibe (ZETIA) 10 MG tablet TAKE 1 TABLET BY MOUTH DAILY 30 tablet 5  . Multiple Vitamins-Minerals (CENTRUM SILVER 50+WOMEN PO) Centrum Silver    . omeprazole (PRILOSEC) 20 MG  capsule TAKE 1 CAPSULE BY MOUTH TWO TIMES DAILY 30-60 MINUTES BEFORE MEALS 60 capsule 3  . Probiotic Product (ALIGN PO) Take by mouth daily.    Marland Kitchen gabapentin (NEURONTIN) 100 MG capsule gabapentin 100 mg capsule    . HYDROcodone-acetaminophen (NORCO/VICODIN) 5-325 MG tablet hydrocodone 5 mg-acetaminophen 325 mg tablet    . ipratropium (ATROVENT) 0.06 % nasal spray 1 spray each side before each meal (Patient not taking: Reported on 02/16/2017) 15 mL 12  . raloxifene (EVISTA) 60 MG tablet raloxifene 60 mg tablet     Current Facility-Administered Medications  Medication Dose Route Frequency Provider Last Rate Last Dose  . denosumab (PROLIA) injection 60 mg  60 mg Subcutaneous Once Einar Pheasant, MD        Allergies  Allergen Reactions  . Clindamycin/Lincomycin Diarrhea  . Sulfa Antibiotics Rash    Rash in throat  . Augmentin [Amoxicillin-Pot Clavulanate] Diarrhea  . Naproxen Sodium Other (See Comments)    Causes gastritis  . Tramadol Diarrhea    "upset stomach, headache, dizziness, drowsiness"  . Acetaminophen Rash    "upsets liver enzymes"  . Codeine Nausea Only and Rash    Upset stomach  . Doxycycline Nausea And Vomiting  . Doxycycline Hyclate Nausea And Vomiting and Nausea Only    Dry heaves. Dry heaves.  Marland Kitchen  E-Mycin [Erythromycin] Rash  . Lipitor [Atorvastatin] Other (See Comments) and Rash    "upsets liver enzymes" Upsets liver enzymes  . Macrobid [Nitrofurantoin] Rash    severe  . Nitrofurantoin Monohyd Macro Rash  . Nsaids Rash    Gastritis, GI ulceration. Gastritis, GI ulceration.  . Phenobarbital Other (See Comments), Rash and Anxiety    "got wild" Patient becomes very hyper and anxious  . Relafen [Nabumetone] Other (See Comments) and Rash    Liver enzymes. Liver enzymes. Upsets liver enzymes  . Statins Rash    Upsets liver enzymes  . Sulfacetamide Sodium Rash    Rash in throat  . Sulfasalazine Rash    Rash in throat Rash in throat  . Tramadol Hcl Nausea Only       REVIEW OF SYSTEMS (Negative unless checked)  Constitutional: [] Weight loss  [] Fever  [] Chills Cardiac: [] Chest pain   [] Chest pressure   [] Palpitations   [] Shortness of breath when laying flat   [] Shortness of breath at rest   [] Shortness of breath with exertion. Vascular:  [] Pain in legs with walking   [] Pain in legs at rest   [] Pain in legs when laying flat   [] Claudication   [] Pain in feet when walking  [] Pain in feet at rest  [] Pain in feet when laying flat   [] History of DVT   [] Phlebitis   [] Swelling in legs   [] Varicose veins   [] Non-healing ulcers Pulmonary:   [] Uses home oxygen   [] Productive cough   [] Hemoptysis   [] Wheeze  [] COPD    Neurologic:  [] Dizziness  [] Blackouts   [] Seizures   [] History of stroke   [] History of TIA  [] Aphasia   [] Temporary blindness   [] Dysphagia   [] Weakness or numbness in arms   [] Weakness or numbness in legs Musculoskeletal:  [] Arthritis   [] Joint swelling   [] Joint pain   [] Low back pain Hematologic:  [] Easy bruising  [] Easy bleeding   [] Hypercoagulable state   [] Anemic  [] Thrombocytopenia Gastrointestinal:  [] Blood in stool   [] Vomiting blood  [] Gastroesophageal reflux/heartburn   [] Difficulty swallowing. Genitourinary:  [] Chronic kidney disease   [] Difficult urination  [] Frequent urination  [] Burning with urination   [] Blood in urine Skin:  [] Rashes   [] Ulcers   [] Wounds Psychological:  [] History of anxiety   []  History of major depression.  Physical Examination  Vitals:   02/16/17 1453  BP: 122/74  Pulse: 71  Resp: 15  Weight: 159 lb (72.1 kg)  Height: 5\' 6"  (1.676 m)   Body mass index is 25.66 kg/m. Gen:  WD/WN, NAD, appears Younger than stated age Head: [place]/AT, No temporalis wasting. Ear/Nose/Throat: Hearing grossly intact, trachea midline Eyes: Conjunctiva clear. Sclera non-icteric Neck: Supple.  No JVD. Trachea midline Pulmonary:  Good air movement, respirations not labored, no use of accessory muscles.  Cardiac: RRR, normal S1,  S2. Vascular:  Vessel Right Left  Radial Palpable Palpable                                   Gastrointestinal: soft, non-tender/non-distended. Musculoskeletal: M/S 5/5 throughout.  No deformity or atrophy. Trace to 1+ bilateral lower extremity edema. Scattered varicosities on the right with more prominent varicosities on the left but all are soft and without inflammation. Neurologic: Sensation grossly intact in extremities.  Symmetrical.  Speech is fluent. Psychiatric: Judgment intact, Mood & affect appropriate for pt's clinical situation. Dermatologic: No rashes or ulcers noted.  No  cellulitis or open wounds.      Labs Recent Results (from the past 2160 hour(s))  TSH     Status: None   Collection Time: 12/14/16  8:46 AM  Result Value Ref Range   TSH 0.78 0.35 - 4.50 uIU/mL  VITAMIN D 25 Hydroxy (Vit-D Deficiency, Fractures)     Status: None   Collection Time: 12/14/16  8:46 AM  Result Value Ref Range   VITD 34.11 30.00 - 100.00 ng/mL  Lipid panel     Status: Abnormal   Collection Time: 12/14/16  8:46 AM  Result Value Ref Range   Cholesterol 180 0 - 200 mg/dL    Comment: ATP III Classification       Desirable:  < 200 mg/dL               Borderline High:  200 - 239 mg/dL          High:  > = 240 mg/dL   Triglycerides 87.0 0.0 - 149.0 mg/dL    Comment: Normal:  <150 mg/dLBorderline High:  150 - 199 mg/dL   HDL 55.50 >39.00 mg/dL   VLDL 17.4 0.0 - 40.0 mg/dL   LDL Cholesterol 107 (H) 0 - 99 mg/dL   Total CHOL/HDL Ratio 3     Comment:                Men          Women1/2 Average Risk     3.4          3.3Average Risk          5.0          4.42X Average Risk          9.6          7.13X Average Risk          15.0          11.0                       NonHDL 124.82     Comment: NOTE:  Non-HDL goal should be 30 mg/dL higher than patient's LDL goal (i.e. LDL goal of < 70 mg/dL, would have non-HDL goal of < 100 mg/dL)  Urine Culture     Status: None   Collection Time: 12/16/16 10:11  AM  Result Value Ref Range   Culture ESCHERICHIA COLI    Colony Count Greater than 100,000 CFU/mL    Organism ID, Bacteria ESCHERICHIA COLI       Susceptibility   Escherichia coli -  (no method available)    AMPICILLIN <=2 Sensitive     AMOX/CLAVULANIC <=2 Sensitive     AMPICILLIN/SULBACTAM <=2 Sensitive     PIP/TAZO <=4 Sensitive     IMIPENEM <=0.25 Sensitive     CEFAZOLIN 27 Sensitive     CEFTRIAXONE <=1 Sensitive     CEFTAZIDIME <=1 Sensitive     CEFEPIME <=1 Sensitive     GENTAMICIN <=1 Sensitive     TOBRAMYCIN <=1 Sensitive     CIPROFLOXACIN <=0.25 Sensitive     LEVOFLOXACIN <=0.12 Sensitive     NITROFURANTOIN <=16 Sensitive     TRIMETH/SULFA* <=20 Sensitive      * NR=NOT REPORTABLE,SEE COMMENTORAL therapy:A cefazolin MIC of <32 predicts susceptibility to the oral agents cefaclor,cefdinir,cefpodoxime,cefprozil,cefuroxime,cephalexin,and loracarbef when used for therapy of uncomplicated UTIs due to E.coli,K.pneumomiae,and P.mirabilis. PARENTERAL therapy: A cefazolinMIC of >8 indicates resistance to parenteralcefazolin. An alternate test method must beperformed  to confirm susceptibility to parenteralcefazolin.  Urinalysis, Routine w reflex microscopic     Status: Abnormal   Collection Time: 12/16/16 10:19 AM  Result Value Ref Range   Color, Urine YELLOW Yellow;Lt. Yellow   APPearance CLEAR Clear   Specific Gravity, Urine 1.020 1.000 - 1.030   pH 5.5 5.0 - 8.0   Total Protein, Urine TRACE (A) Negative   Urine Glucose NEGATIVE Negative   Ketones, ur NEGATIVE Negative   Bilirubin Urine NEGATIVE Negative   Hgb urine dipstick MODERATE (A) Negative   Urobilinogen, UA 0.2 0.0 - 1.0   Leukocytes, UA MODERATE (A) Negative   Nitrite POSITIVE (A) Negative   WBC, UA 11-20/hpf (A) 0-2/hpf   RBC / HPF 7-10/hpf (A) 0-2/hpf   Squamous Epithelial / LPF Rare(0-4/hpf) Rare(0-4/hpf)   Bacteria, UA Few(10-50/hpf) (A) None    Radiology No results found.    Assessment/Plan  Essential  hypertension, benign blood pressure control important in reducing the progression of atherosclerotic disease. On appropriate oral medications.   Pain in limb Seems to be more orthopedic in nature and musculoskeletal than venous at this point. Her venous disease is reasonably stable.  Venous stasis of both lower extremities Reasonably stable. No new ulceration or infection. Her residual varicose veins or superficial and reasonably mild to moderate in nature. No intervention currently planned. Plan to recheck in 1 year.    Leotis Pain, MD  02/16/2017 4:13 PM    This note was created with Dragon medical transcription system.  Any errors from dictation are purely unintentional

## 2017-02-16 NOTE — Patient Instructions (Signed)

## 2017-02-16 NOTE — Assessment & Plan Note (Signed)
Seems to be more orthopedic in nature and musculoskeletal than venous at this point. Her venous disease is reasonably stable.

## 2017-02-16 NOTE — Assessment & Plan Note (Signed)
blood pressure control important in reducing the progression of atherosclerotic disease. On appropriate oral medications.  

## 2017-02-16 NOTE — Telephone Encounter (Signed)
Pt had Prolia on 11/24/16 & next one will be due in August (she will call closer to that date). Patient would like for this to be updated in her chart. She states that she keeps getting notifications that she is overdue.

## 2017-02-17 DIAGNOSIS — R339 Retention of urine, unspecified: Secondary | ICD-10-CM | POA: Insufficient documentation

## 2017-02-17 NOTE — Telephone Encounter (Signed)
Spoke with pt and explained to her that we have everything documented that we need but that it may be that the other providers that she sees are not connected with our system so it isn't transferring over to them.

## 2017-03-05 ENCOUNTER — Other Ambulatory Visit: Payer: Self-pay | Admitting: Internal Medicine

## 2017-04-04 ENCOUNTER — Encounter: Payer: Self-pay | Admitting: Internal Medicine

## 2017-04-20 ENCOUNTER — Telehealth: Payer: Self-pay | Admitting: *Deleted

## 2017-04-20 ENCOUNTER — Encounter: Payer: Self-pay | Admitting: Anesthesiology

## 2017-04-20 ENCOUNTER — Ambulatory Visit: Payer: Medicare Other | Attending: Anesthesiology | Admitting: Anesthesiology

## 2017-04-20 VITALS — BP 156/67 | HR 57 | Temp 97.8°F | Resp 18 | Ht 66.0 in | Wt 159.0 lb

## 2017-04-20 DIAGNOSIS — Z79899 Other long term (current) drug therapy: Secondary | ICD-10-CM | POA: Insufficient documentation

## 2017-04-20 DIAGNOSIS — M545 Low back pain, unspecified: Secondary | ICD-10-CM

## 2017-04-20 DIAGNOSIS — M5441 Lumbago with sciatica, right side: Secondary | ICD-10-CM | POA: Insufficient documentation

## 2017-04-20 DIAGNOSIS — G8929 Other chronic pain: Secondary | ICD-10-CM | POA: Diagnosis not present

## 2017-04-20 MED ORDER — ROPIVACAINE HCL 2 MG/ML IJ SOLN
10.0000 mL | Freq: Once | INTRAMUSCULAR | Status: AC
  Administered 2017-04-20: 10 mL via EPIDURAL
  Filled 2017-04-20: qty 10

## 2017-04-20 MED ORDER — DEXAMETHASONE SODIUM PHOSPHATE 10 MG/ML IJ SOLN
10.0000 mg | Freq: Once | INTRAMUSCULAR | Status: AC
  Administered 2017-04-20: 10 mg
  Filled 2017-04-20: qty 1

## 2017-04-20 NOTE — Telephone Encounter (Signed)
Patient was a walk in requesting X ray results from [facility] , as [facility] clinic faxed results to [facility] radiology, pt is requesting results today. Pt filled out a triage form

## 2017-04-20 NOTE — Progress Notes (Signed)
Safety precautions to be maintained throughout the outpatient stay will include: orient to surroundings, keep bed in low position, maintain call bell within reach at all times, provide assistance with transfer out of bed and ambulation.  

## 2017-04-20 NOTE — Patient Instructions (Signed)
Epidural Steroid Injection Patient Information  Description: The epidural space surrounds the nerves as they exit the spinal cord.  In some patients, the nerves can be compressed and inflamed by a bulging disc or a tight spinal canal (spinal stenosis).  By injecting steroids into the epidural space, we can bring irritated nerves into direct contact with a potentially helpful medication.  These steroids act directly on the irritated nerves and can reduce swelling and inflammation which often leads to decreased pain.  Epidural steroids may be injected anywhere along the spine and from the neck to the low back depending upon the location of your pain.   After numbing the skin with local anesthetic (like Novocaine), a small needle is passed into the epidural space slowly.  You may experience a sensation of pressure while this is being done.  The entire block usually last less than 10 minutes.  Conditions which may be treated by epidural steroids:   Low back and leg pain  Neck and arm pain  Spinal stenosis  Post-laminectomy syndrome  Herpes zoster (shingles) pain  Pain from compression fractures  Preparation for the injection:  1. Do not eat any solid food or dairy products within 8 hours of your appointment.  2. You may drink clear liquids up to 3 hours before appointment.  Clear liquids include water, black coffee, juice or soda.  No milk or cream please. 3. You may take your regular medication, including pain medications, with a sip of water before your appointment  Diabetics should hold regular insulin (if taken separately) and take 1/2 normal NPH dos the morning of the procedure.  Carry some sugar containing items with you to your appointment. 4. A driver must accompany you and be prepared to drive you home after your procedure.  5. Bring all your current medications with your. 6. An IV may be inserted and sedation may be given at the discretion of the physician.   7. A blood pressure  cuff, EKG and other monitors will often be applied during the procedure.  Some patients may need to have extra oxygen administered for a short period. 8. You will be asked to provide medical information, including your allergies, prior to the procedure.  We must know immediately if you are taking blood thinners (like Coumadin/Warfarin)  Or if you are allergic to IV iodine contrast (dye). We must know if you could possible be pregnant.  Possible side-effects:  Bleeding from needle site  Infection (rare, may require surgery)  Nerve injury (rare)  Numbness & tingling (temporary)  Difficulty urinating (rare, temporary)  Spinal headache ( a headache worse with upright posture)  Light -headedness (temporary)  Pain at injection site (several days)  Decreased blood pressure (temporary)  Weakness in arm/leg (temporary)  Pressure sensation in back/neck (temporary)  Call if you experience:  Fever/chills associated with headache or increased back/neck pain.  Headache worsened by an upright position.  New onset weakness or numbness of an extremity below the injection site  Hives or difficulty breathing (go to the emergency room)  Inflammation or drainage at the infection site  Severe back/neck pain  Any new symptoms which are concerning to you  Please note:  Although the local anesthetic injected can often make your back or neck feel good for several hours after the injection, the pain will likely return.  It takes 3-7 days for steroids to work in the epidural space.  You may not notice any pain relief for at least that one week.    If effective, we will often do a series of three injections spaced 3-6 weeks apart to maximally decrease your pain.  After the initial series, we generally will wait several months before considering a repeat injection of the same type.  If you have any questions, please call [phone] [facility] 

## 2017-04-22 NOTE — Progress Notes (Signed)
Subjective:  Patient ID: Brenda Cardenas, female    DOB: 1932/09/19  Age: 83 y.o. MRN: [id number]  CC: Back Pain (lower)   HPI Brenda Cardenas presents for reevaluation. She continues to have severe lower back pain. This is primarily centralized across her lower back with some complaints of burning shooting and spasming pain occasionally radiating down into the legs. The pain she is having at this point is pry positionally related and worse with of the lumbar region with spasming noted. Her current medication regimen does not help control the pain and in the past she has had epidural steroids for the leg pain with significant success. Otherwise she has no new troubles with bowel or bladder problems. She taking her medications as prescribed with no difficulties.  Outpatient Medications Prior to Visit  Medication Sig Dispense Refill  . acetaminophen (TYLENOL) 325 MG tablet Take 650 mg by mouth 2 (two) times daily. May take up to four  Per day/ 2 in the morning and 2 in the afternoon    . atenolol (TENORMIN) 25 MG tablet TAKE 1 TABLET BY MOUTH DAILY 90 tablet 1  . calcium citrate-vitamin D (CITRACAL+D) 315-200 MG-UNIT per tablet Take 2 tablets by mouth daily.    . Cholecalciferol (VITAMIN D3) 1000 units CAPS Take 1 capsule by mouth daily.    Marland Kitchen ezetimibe (ZETIA) 10 MG tablet TAKE 1 TABLET BY MOUTH DAILY 30 tablet 5  . ipratropium (ATROVENT) 0.06 % nasal spray 1 spray each side before each meal 15 mL 12  . Multiple Vitamins-Minerals (CENTRUM SILVER 50+WOMEN PO) Centrum Silver    . omeprazole (PRILOSEC) 20 MG capsule TAKE 1 CAPSULE BY MOUTH TWO TIMES DAILY 30-60 MINUTES BEFORE MEALS 60 capsule 3  . Probiotic Product (ALIGN PO) Take by mouth daily.    Marland Kitchen gabapentin (NEURONTIN) 100 MG capsule gabapentin 100 mg capsule    . HYDROcodone-acetaminophen (NORCO/VICODIN) 5-325 MG tablet hydrocodone 5 mg-acetaminophen 325 mg tablet    . raloxifene (EVISTA) 60 MG tablet raloxifene 60 mg tablet      Facility-Administered Medications Prior to Visit  Medication Dose Route Frequency Provider Last Rate Last Dose  . denosumab (PROLIA) injection 60 mg  60 mg Subcutaneous Once Einar Pheasant, MD       ROS Review of Systems  Objective:  BP (!) 156/67   Pulse (!) 57   Temp 97.8 F (36.6 C) (Oral)   Resp 18   Ht 5\' 6"  (1.676 m)   Wt 159 lb (72.1 kg)   SpO2 100%   BMI 25.66 kg/m    BP Readings from Last 3 Encounters:  04/20/17 (!) 156/67  02/16/17 122/74  12/21/16 106/70     Wt Readings from Last 3 Encounters:  04/20/17 159 lb (72.1 kg)  02/16/17 159 lb (72.1 kg)  12/21/16 158 lb (71.7 kg)     Physical Exam  PERRL EOMI HEART IS RRR  LCTA MUSCULOSKELETALreveals 2 trigger points in the right lumbar region approximate 5 cm to the right of midline with leg strength at baseline good muscle tone and bulk but she walks with an antalgic gait and has an obvious scoliotic deformity in the lumbar spine.  Labs  No results found for: HGBA1C Lab Results  Component Value Date   LDLCALC 107 (H) 12/14/2016   CREATININE 0.95 07/13/2016    -------------------------------------------------------------------------------------------------------------------- Lab Results  Component Value Date   WBC 7.8 08/13/2016   HGB 12.9 08/13/2016   HCT 39.0 08/13/2016   PLT 186.0 08/13/2016   GLUCOSE  90 07/13/2016   CHOL 180 12/14/2016   TRIG 87.0 12/14/2016   HDL 55.50 12/14/2016   LDLCALC 107 (H) 12/14/2016   ALT 13 07/13/2016   AST 18 07/13/2016   NA 138 07/13/2016   K 4.4 07/13/2016   CL 103 07/13/2016   CREATININE 0.95 07/13/2016   BUN 20 07/13/2016   CO2 28 07/13/2016   TSH 0.78 12/14/2016    --------------------------------------------------------------------------------------------------------------------- No results found.   Assessment & Plan:   Brenda Cardenas was seen today for back pain.  Diagnoses and all orders for this visit:  Low back pain at multiple sites -      dexamethasone (DECADRON) injection 10 mg; 1 mL (10 mg total) by Other route once. -     ropivacaine (PF) 2 mg/mL (0.2%) (NAROPIN) injection 10 mL; 10 mLs by Epidural route once.  Chronic right-sided low back pain without sciatica  Acute bilateral low back pain with right-sided sciatica -     dexamethasone (DECADRON) injection 10 mg; 1 mL (10 mg total) by Other route once. -     ropivacaine (PF) 2 mg/mL (0.2%) (NAROPIN) injection 10 mL; 10 mLs by Epidural route once. -     Lumbar Epidural Injection; Future        ----------------------------------------------------------------------------------------------------------------------  Problem List Items Addressed This Visit      Other   Right-sided low back pain without sciatica   Relevant Medications   dexamethasone (DECADRON) injection 10 mg (Completed)    Other Visit Diagnoses    Low back pain at multiple sites    -  Primary   Relevant Medications   dexamethasone (DECADRON) injection 10 mg (Completed)   ropivacaine (PF) 2 mg/mL (0.2%) (NAROPIN) injection 10 mL (Completed)   Acute bilateral low back pain with right-sided sciatica       Relevant Medications   dexamethasone (DECADRON) injection 10 mg (Completed)   ropivacaine (PF) 2 mg/mL (0.2%) (NAROPIN) injection 10 mL (Completed)   Other Relevant Orders   Lumbar Epidural Injection        ----------------------------------------------------------------------------------------------------------------------  1. Low back pain at multiple sites [facility] plan on trigger point injections to the after mentioned trigger points today We have gone over the risks benefits of the procedure in full detail. - dexamethasone (DECADRON) injection 10 mg; 1 mL (10 mg total) by Other route once. - ropivacaine (PF) 2 mg/mL (0.2%) (NAROPIN) injection 10 mL; 10 mLs by Epidural route once.  2. Chronic right-sided low back pain without sciatica Return to clinic in 1 month for an epidural steroid  injection to help with the  L5 radiculitis that she experiences  3. Acute bilateral low back pain with right-sided sciatica As above - dexamethasone (DECADRON) injection 10 mg; 1 mL (10 mg total) by Other route once. - ropivacaine (PF) 2 mg/mL (0.2%) (NAROPIN) injection 10 mL; 10 mLs by Epidural route once. - Lumbar Epidural Injection; Future    ----------------------------------------------------------------------------------------------------------------------  I am having Brenda Cardenas maintain her calcium citrate-vitamin D, acetaminophen, ipratropium, Probiotic Product (ALIGN PO), Vitamin D3, ezetimibe, omeprazole, raloxifene, gabapentin, HYDROcodone-acetaminophen, Multiple Vitamins-Minerals (CENTRUM SILVER 50+WOMEN PO), and atenolol. We administered dexamethasone and ropivacaine (PF) 2 mg/mL (0.2%). We will continue to administer denosumab.   Meds ordered this encounter  Medications  . dexamethasone (DECADRON) injection 10 mg  . ropivacaine (PF) 2 mg/mL (0.2%) (NAROPIN) injection 10 mL   Patient's Medications  New Prescriptions   No medications on file  Previous Medications   ACETAMINOPHEN (TYLENOL) 325 MG TABLET    Take 650  mg by mouth 2 (two) times daily. May take up to four  Per day/ 2 in the morning and 2 in the afternoon   ATENOLOL (TENORMIN) 25 MG TABLET    TAKE 1 TABLET BY MOUTH DAILY   CALCIUM CITRATE-VITAMIN D (CITRACAL+D) 315-200 MG-UNIT PER TABLET    Take 2 tablets by mouth daily.   CHOLECALCIFEROL (VITAMIN D3) 1000 UNITS CAPS    Take 1 capsule by mouth daily.   EZETIMIBE (ZETIA) 10 MG TABLET    TAKE 1 TABLET BY MOUTH DAILY   GABAPENTIN (NEURONTIN) 100 MG CAPSULE    gabapentin 100 mg capsule   HYDROCODONE-ACETAMINOPHEN (NORCO/VICODIN) 5-325 MG TABLET    hydrocodone 5 mg-acetaminophen 325 mg tablet   IPRATROPIUM (ATROVENT) 0.06 % NASAL SPRAY    1 spray each side before each meal   MULTIPLE VITAMINS-MINERALS (CENTRUM SILVER 50+WOMEN PO)    Centrum Silver   OMEPRAZOLE  (PRILOSEC) 20 MG CAPSULE    TAKE 1 CAPSULE BY MOUTH TWO TIMES DAILY 30-60 MINUTES BEFORE MEALS   PROBIOTIC PRODUCT (ALIGN PO)    Take by mouth daily.   RALOXIFENE (EVISTA) 60 MG TABLET    raloxifene 60 mg tablet  Modified Medications   No medications on file  Discontinued Medications   No medications on file   ----------------------------------------------------------------------------------------------------------------------  Follow-up: Return in about 2 weeks (around 05/04/2017) for evaluation, procedure.  Procedure: L5 right side lumbar trigger point injection #1  Trigger point injection: The area overlying the aforementioned trigger points were prepped with alcohol. They were then injected with a 25-gauge needle with 4 cc of ropivacaine 0.2% and Decadron 4 mg at each site after negative aspiration for heme. This was performed after informed consent was obtained and risks and benefits reviewed. She tolerated this procedure without difficulty and was convalesced and discharged to home in stable condition for follow-up as mentioned.  @Iaan Oregel  Andree Elk, MD@  Molli Barrows, MD

## 2017-04-30 ENCOUNTER — Ambulatory Visit
Admission: RE | Admit: 2017-04-30 | Discharge: 2017-04-30 | Disposition: A | Discharge status: Home / Self Care | Payer: Medicare Other | Source: Ambulatory Visit | Attending: Anesthesiology | Admitting: Anesthesiology

## 2017-04-30 ENCOUNTER — Ambulatory Visit (HOSPITAL_BASED_OUTPATIENT_CLINIC_OR_DEPARTMENT_OTHER): Payer: Medicare Other | Admitting: Anesthesiology

## 2017-04-30 ENCOUNTER — Encounter: Payer: Self-pay | Admitting: Anesthesiology

## 2017-04-30 ENCOUNTER — Other Ambulatory Visit: Payer: Self-pay | Admitting: Anesthesiology

## 2017-04-30 VITALS — BP 162/82 | HR 72 | Temp 97.8°F | Resp 18 | Ht 66.0 in | Wt 159.0 lb

## 2017-04-30 DIAGNOSIS — M4125 Other idiopathic scoliosis, thoracolumbar region: Secondary | ICD-10-CM

## 2017-04-30 DIAGNOSIS — M48061 Spinal stenosis, lumbar region without neurogenic claudication: Secondary | ICD-10-CM

## 2017-04-30 DIAGNOSIS — G8929 Other chronic pain: Secondary | ICD-10-CM

## 2017-04-30 DIAGNOSIS — R52 Pain, unspecified: Secondary | ICD-10-CM

## 2017-04-30 DIAGNOSIS — M545 Low back pain: Secondary | ICD-10-CM

## 2017-04-30 DIAGNOSIS — M5136 Other intervertebral disc degeneration, lumbar region: Secondary | ICD-10-CM

## 2017-04-30 DIAGNOSIS — M5441 Lumbago with sciatica, right side: Secondary | ICD-10-CM

## 2017-04-30 MED ORDER — MIDAZOLAM HCL 5 MG/5ML IJ SOLN
5.0000 mg | Freq: Once | INTRAMUSCULAR | Status: AC
  Administered 2017-04-30: 2 mg via INTRAVENOUS
  Filled 2017-04-30: qty 5

## 2017-04-30 MED ORDER — IOPAMIDOL (ISOVUE-M 200) INJECTION 41%
20.0000 mL | Freq: Once | INTRAMUSCULAR | Status: DC | PRN
Start: 2017-04-30 — End: 2017-05-20
  Administered 2017-04-30: 10 mL
  Filled 2017-04-30: qty 20

## 2017-04-30 MED ORDER — ROPIVACAINE HCL 2 MG/ML IJ SOLN
10.0000 mL | Freq: Once | INTRAMUSCULAR | Status: AC
  Administered 2017-04-30: 10 mL via EPIDURAL
  Filled 2017-04-30: qty 10

## 2017-04-30 MED ORDER — SODIUM CHLORIDE 0.9% FLUSH
10.0000 mL | Freq: Once | INTRAVENOUS | Status: AC
  Administered 2017-04-30: 10 mL

## 2017-04-30 MED ORDER — TRIAMCINOLONE ACETONIDE 40 MG/ML IJ SUSP
40.0000 mg | Freq: Once | INTRAMUSCULAR | Status: AC
  Administered 2017-04-30: 40 mg
  Filled 2017-04-30: qty 1

## 2017-04-30 MED ORDER — LIDOCAINE HCL (PF) 1 % IJ SOLN
5.0000 mL | Freq: Once | INTRAMUSCULAR | Status: AC
  Administered 2017-04-30: 5 mL via SUBCUTANEOUS
  Filled 2017-04-30: qty 5

## 2017-04-30 MED ORDER — LACTATED RINGERS IV SOLN
1000.0000 mL | INTRAVENOUS | Status: DC
  Administered 2017-04-30: 1000 mL via INTRAVENOUS

## 2017-04-30 NOTE — Progress Notes (Signed)
Safety precautions to be maintained throughout the outpatient stay will include: orient to surroundings, keep bed in low position, maintain call bell within reach at all times, provide assistance with transfer out of bed and ambulation.  

## 2017-04-30 NOTE — Patient Instructions (Signed)
GENERAL RISKS AND COMPLICATIONS  What are the risk, side effects and possible complications? Generally speaking, most procedures are safe.  However, with any procedure there are risks, side effects, and the possibility of complications.  The risks and complications are dependent upon the sites that are lesioned, or the type of nerve block to be performed.  The closer the procedure is to the spine, the more serious the risks are.  Great care is taken when placing the radio frequency needles, block needles or lesioning probes, but sometimes complications can occur. 1. Infection: Any time there is an injection through the skin, there is a risk of infection.  This is why sterile conditions are used for these blocks.  There are four possible types of infection. 1. Localized skin infection. 2. Central Nervous System Infection-This can be in the form of Meningitis, which can be deadly. 3. Epidural Infections-This can be in the form of an epidural abscess, which can cause pressure inside of the spine, causing compression of the spinal cord with subsequent paralysis. This would require an emergency surgery to decompress, and there are no guarantees that the patient would recover from the paralysis. 4. Discitis-This is an infection of the intervertebral discs.  It occurs in about 1% of discography procedures.  It is difficult to treat and it may lead to surgery.        2. Pain: the needles have to go through skin and soft tissues, will cause soreness.       3. Damage to internal structures:  The nerves to be lesioned may be near blood vessels or    other nerves which can be potentially damaged.       4. Bleeding: Bleeding is more common if the patient is taking blood thinners such as  aspirin, Coumadin, Ticiid, Plavix, etc., or if he/she have some genetic predisposition  such as hemophilia. Bleeding into the spinal canal can cause compression of the spinal  cord with subsequent paralysis.  This would require an  emergency surgery to  decompress and there are no guarantees that the patient would recover from the  paralysis.       5. Pneumothorax:  Puncturing of a lung is a possibility, every time a needle is introduced in  the area of the chest or upper back.  Pneumothorax refers to free air around the  collapsed lung(s), inside of the thoracic cavity (chest cavity).  Another two possible  complications related to a similar event would include: Hemothorax and Chylothorax.   These are variations of the Pneumothorax, where instead of air around the collapsed  lung(s), you may have blood or chyle, respectively.       6. Spinal headaches: They may occur with any procedures in the area of the spine.       7. Persistent CSF (Cerebro-Spinal Fluid) leakage: This is a rare problem, but may occur  with prolonged intrathecal or epidural catheters either due to the formation of a fistulous  track or a dural tear.       8. Nerve damage: By working so close to the spinal cord, there is always a possibility of  nerve damage, which could be as serious as a permanent spinal cord injury with  paralysis.       9. Death:  Although rare, severe deadly allergic reactions known as "Anaphylactic  reaction" can occur to any of the medications used.      10. Worsening of the symptoms:  We can always make thing worse.    What are the chances of something like this happening? Chances of any of this occuring are extremely low.  By statistics, you have more of a chance of getting killed in a motor vehicle accident: while driving to the hospital than any of the above occurring .  Nevertheless, you should be aware that they are possibilities.  In general, it is similar to taking a shower.  Everybody knows that you can slip, hit your head and get killed.  Does that mean that you should not shower again?  Nevertheless always keep in mind that statistics do not mean anything if you happen to be on the wrong side of them.  Even if a procedure has a 1  (one) in a 1,000,000 (million) chance of going wrong, it you happen to be that one..Also, keep in mind that by statistics, you have more of a chance of having something go wrong when taking medications.  Who should not have this procedure? If you are on a blood thinning medication (e.g. Coumadin, Plavix, see list of "Blood Thinners"), or if you have an active infection going on, you should not have the procedure.  If you are taking any blood thinners, please inform your physician.  How should I prepare for this procedure?  Do not eat or drink anything at least six hours prior to the procedure.  Bring a driver with you .  It cannot be a taxi.  Come accompanied by an adult that can drive you back, and that is strong enough to help you if your legs get weak or numb from the local anesthetic.  Take all of your medicines the morning of the procedure with just enough water to swallow them.  If you have diabetes, make sure that you are scheduled to have your procedure done first thing in the morning, whenever possible.  If you have diabetes, take only half of your insulin dose and notify our nurse that you have done so as soon as you arrive at the clinic.  If you are diabetic, but only take blood sugar pills (oral hypoglycemic), then do not take them on the morning of your procedure.  You may take them after you have had the procedure.  Do not take aspirin or any aspirin-containing medications, at least eleven (11) days prior to the procedure.  They may prolong bleeding.  Wear loose fitting clothing that may be easy to take off and that you would not mind if it got stained with Betadine or blood.  Do not wear any jewelry or perfume  Remove any nail coloring.  It will interfere with some of our monitoring equipment.  NOTE: Remember that this is not meant to be interpreted as a complete list of all possible complications.  Unforeseen problems may occur.  BLOOD THINNERS The following drugs  contain aspirin or other products, which can cause increased bleeding during surgery and should not be taken for 2 weeks prior to and 1 week after surgery.  If you should need take something for relief of minor pain, you may take acetaminophen which is found in Tylenol,m Datril, Anacin-3 and Panadol. It is not blood thinner. The products listed below are.  Do not take any of the products listed below in addition to any listed on your instruction sheet.  A.P.C or A.P.C with Codeine Codeine Phosphate Capsules #3 Ibuprofen Ridaura  ABC compound Congesprin Imuran rimadil  Advil Cope Indocin Robaxisal  Alka-Seltzer Effervescent Pain Reliever and Antacid Coricidin or Coricidin-D  Indomethacin Rufen    Alka-Seltzer plus Cold Medicine Cosprin Ketoprofen S-A-C Tablets  Anacin Analgesic Tablets or Capsules Coumadin Korlgesic Salflex  Anacin Extra Strength Analgesic tablets or capsules CP-2 Tablets Lanoril Salicylate  Anaprox Cuprimine Capsules Levenox Salocol  Anexsia-D Dalteparin Magan Salsalate  Anodynos Darvon compound Magnesium Salicylate Sine-off  Ansaid Dasin Capsules Magsal Sodium Salicylate  Anturane Depen Capsules Marnal Soma  APF Arthritis pain formula Dewitt's Pills Measurin Stanback  Argesic Dia-Gesic Meclofenamic Sulfinpyrazone  Arthritis Bayer Timed Release Aspirin Diclofenac Meclomen Sulindac  Arthritis pain formula Anacin Dicumarol Medipren Supac  Analgesic (Safety coated) Arthralgen Diffunasal Mefanamic Suprofen  Arthritis Strength Bufferin Dihydrocodeine Mepro Compound Suprol  Arthropan liquid Dopirydamole Methcarbomol with Aspirin Synalgos  ASA tablets/Enseals Disalcid Micrainin Tagament  Ascriptin Doan's Midol Talwin  Ascriptin A/D Dolene Mobidin Tanderil  Ascriptin Extra Strength Dolobid Moblgesic Ticlid  Ascriptin with Codeine Doloprin or Doloprin with Codeine Momentum Tolectin  Asperbuf Duoprin Mono-gesic Trendar  Aspergum Duradyne Motrin or Motrin IB Triminicin  Aspirin  plain, buffered or enteric coated Durasal Myochrisine Trigesic  Aspirin Suppositories Easprin Nalfon Trillsate  Aspirin with Codeine Ecotrin Regular or Extra Strength Naprosyn Uracel  Atromid-S Efficin Naproxen Ursinus  Auranofin Capsules Elmiron Neocylate Vanquish  Axotal Emagrin Norgesic Verin  Azathioprine Empirin or Empirin with Codeine Normiflo Vitamin E  Azolid Emprazil Nuprin Voltaren  Bayer Aspirin plain, buffered or children's or timed BC Tablets or powders Encaprin Orgaran Warfarin Sodium  Buff-a-Comp Enoxaparin Orudis Zorpin  Buff-a-Comp with Codeine Equegesic Os-Cal-Gesic   Buffaprin Excedrin plain, buffered or Extra Strength Oxalid   Bufferin Arthritis Strength Feldene Oxphenbutazone   Bufferin plain or Extra Strength Feldene Capsules Oxycodone with Aspirin   Bufferin with Codeine Fenoprofen Fenoprofen Pabalate or Pabalate-SF   Buffets II Flogesic Panagesic   Buffinol plain or Extra Strength Florinal or Florinal with Codeine Panwarfarin   Buf-Tabs Flurbiprofen Penicillamine   Butalbital Compound Four-way cold tablets Penicillin   Butazolidin Fragmin Pepto-Bismol   Carbenicillin Geminisyn Percodan   Carna Arthritis Reliever Geopen Persantine   Carprofen Gold's salt Persistin   Chloramphenicol Goody's Phenylbutazone   Chloromycetin Haltrain Piroxlcam   Clmetidine heparin Plaquenil   Cllnoril Hyco-pap Ponstel   Clofibrate Hydroxy chloroquine Propoxyphen         Before stopping any of these medications, be sure to consult the physician who ordered them.  Some, such as Coumadin (Warfarin) are ordered to prevent or treat serious conditions such as "deep thrombosis", "pumonary embolisms", and other heart problems.  The amount of time that you may need off of the medication may also vary with the medication and the reason for which you were taking it.  If you are taking any of these medications, please make sure you notify your pain physician before you undergo any  procedures.         Epidural Steroid Injection An epidural steroid injection is a shot of steroid medicine and numbing medicine that is given into the space between the spinal cord and the bones in your back (epidural space). The shot helps relieve pain caused by an irritated or swollen nerve root. The amount of pain relief you get from the injection depends on what is causing the nerve to be swollen and irritated, and how long your pain lasts. You are more likely to benefit from this injection if your pain is strong and comes on suddenly rather than if you have had pain for a long time. Tell a health care provider about:  Any allergies you have.  All medicines you are taking, including vitamins, herbs,   eye drops, creams, and over-the-counter medicines.  Any problems you or family members have had with anesthetic medicines.  Any blood disorders you have.  Any surgeries you have had.  Any medical conditions you have.  Whether you are pregnant or may be pregnant. What are the risks? Generally, this is a safe procedure. However, problems may occur, including:  Headache.  Bleeding.  Infection.  Allergic reaction to medicines.  Damage to your nerves.  What happens before the procedure? Staying hydrated Follow instructions from your health care provider about hydration, which may include:  Up to 2 hours before the procedure - you may continue to drink clear liquids, such as water, clear fruit juice, black coffee, and plain tea.  Eating and drinking restrictions Follow instructions from your health care provider about eating and drinking, which may include:  8 hours before the procedure - stop eating heavy meals or foods such as meat, fried foods, or fatty foods.  6 hours before the procedure - stop eating light meals or foods, such as toast or cereal.  6 hours before the procedure - stop drinking milk or drinks that contain milk.  2 hours before the procedure - stop  drinking clear liquids.  Medicine  You may be given medicines to lower anxiety.  Ask your health care provider about: ? Changing or stopping your regular medicines. This is especially important if you are taking diabetes medicines or blood thinners. ? Taking medicines such as aspirin and ibuprofen. These medicines can thin your blood. Do not take these medicines before your procedure if your health care provider instructs you not to. General instructions  Plan to have someone take you home from the hospital or clinic. What happens during the procedure?  You may receive a medicine to help you relax (sedative).  You will be asked to lie on your abdomen.  The injection site will be cleaned.  A numbing medicine (local anesthetic) will be used to numb the injection site.  A needle will be inserted through your skin into the epidural space. You may feel some discomfort when this happens. An X-ray machine will be used to make sure the needle is put as close as possible to the affected nerve.  A steroid medicine and a local anesthetic will be injected into the epidural space.  The needle will be removed.  A bandage (dressing) will be put over the injection site. What happens after the procedure?  Your blood pressure, heart rate, breathing rate, and blood oxygen level will be monitored until the medicines you were given have worn off.  Your arm or leg may feel weak or numb for a few hours.  The injection site may feel sore.  Do not drive for 24 hours if you received a sedative. This information is not intended to replace advice given to you by your health care provider. Make sure you discuss any questions you have with your health care provider. Document Released: 12/22/2007 Document Revised: 02/26/2016 Document Reviewed: 12/31/2015 [employer] Interactive Patient Education  2017 [employer]  

## 2017-04-30 NOTE — Progress Notes (Signed)
Subjective:  Patient ID: Brenda Cardenas, female    DOB: November 20, 1931  Age: 83 y.o. MRN: [id number]  CC: Back Pain (lower and around waist)  Procedure: L1-2 epidural steroid under fluoroscopic guidance with moderate sedation #1 HPI Brenda Cardenas was last seen a week and a half ago. She continues to complain of severe pain in the thoracolumbar region and the right posterior hip region. A trigger point was performed at that time which yielded minimal relief. She is still getting pain radiating into the right hip and of the right posterior buttock region. Occasionally she'll have some pain that radiates into the legs right greater than left as well. No problems with bowel or bladder dysfunction is noted. She takes periodic Tylenol for her pain but no other medications are utilized at this time and she cannot tolerate nonsteroidals. She has failed conservative therapy and desires to proceed with an epidural steroid injection today. These have given her good relief in the past.  Outpatient Medications Prior to Visit  Medication Sig Dispense Refill  . acetaminophen (TYLENOL) 325 MG tablet Take 650 mg by mouth 2 (two) times daily. May take up to four  Per day/ 2 in the morning and 2 in the afternoon    . atenolol (TENORMIN) 25 MG tablet TAKE 1 TABLET BY MOUTH DAILY 90 tablet 1  . calcium citrate-vitamin D (CITRACAL+D) 315-200 MG-UNIT per tablet Take 2 tablets by mouth daily.    . Cholecalciferol (VITAMIN D3) 1000 units CAPS Take 1 capsule by mouth daily.    Marland Kitchen ezetimibe (ZETIA) 10 MG tablet TAKE 1 TABLET BY MOUTH DAILY 30 tablet 5  . gabapentin (NEURONTIN) 100 MG capsule gabapentin 100 mg capsule    . Multiple Vitamins-Minerals (CENTRUM SILVER 50+WOMEN PO) Centrum Silver    . omeprazole (PRILOSEC) 20 MG capsule TAKE 1 CAPSULE BY MOUTH TWO TIMES DAILY 30-60 MINUTES BEFORE MEALS 60 capsule 3  . Probiotic Product (ALIGN PO) Take by mouth daily.    . raloxifene (EVISTA) 60 MG tablet raloxifene 60 mg tablet     . HYDROcodone-acetaminophen (NORCO/VICODIN) 5-325 MG tablet hydrocodone 5 mg-acetaminophen 325 mg tablet    . ipratropium (ATROVENT) 0.06 % nasal spray 1 spray each side before each meal (Patient not taking: Reported on 04/30/2017) 15 mL 12   Facility-Administered Medications Prior to Visit  Medication Dose Route Frequency Provider Last Rate Last Dose  . denosumab (PROLIA) injection 60 mg  60 mg Subcutaneous Once Einar Pheasant, MD       ROS Review of Systems  Objective:  BP 118/81   Pulse 66   Temp 97.8 F (36.6 C) (Oral)   Resp 18   Ht 5\' 6"  (1.676 m)   Wt 159 lb (72.1 kg)   SpO2 99%   BMI 25.66 kg/m    BP Readings from Last 3 Encounters:  04/30/17 118/81  04/20/17 (!) 156/67  02/16/17 122/74     Wt Readings from Last 3 Encounters:  04/30/17 159 lb (72.1 kg)  04/20/17 159 lb (72.1 kg)  02/16/17 159 lb (72.1 kg)     Physical Exam  PERRL EOMI HEART IS RRR  LCTA MUSCULOSKELETAL no change in lower extremity strength or function from baseline examination as noted. Her muscle tone and bulk to the lower extremities is good she has some paraspinous muscle tenderness in the lumbar region as well. Labs  No results found for: HGBA1C Lab Results  Component Value Date   LDLCALC 107 (H) 12/14/2016   CREATININE 0.95 07/13/2016    --------------------------------------------------------------------------------------------------------------------  Lab Results  Component Value Date   WBC 7.8 08/13/2016   HGB 12.9 08/13/2016   HCT 39.0 08/13/2016   PLT 186.0 08/13/2016   GLUCOSE 90 07/13/2016   CHOL 180 12/14/2016   TRIG 87.0 12/14/2016   HDL 55.50 12/14/2016   LDLCALC 107 (H) 12/14/2016   ALT 13 07/13/2016   AST 18 07/13/2016   NA 138 07/13/2016   K 4.4 07/13/2016   CL 103 07/13/2016   CREATININE 0.95 07/13/2016   BUN 20 07/13/2016   CO2 28 07/13/2016   TSH 0.78 12/14/2016     --------------------------------------------------------------------------------------------------------------------- No results found.   Assessment & Plan:   Brenda Cardenas was seen today for back pain.  Diagnoses and all orders for this visit:  Spinal stenosis of lumbar region without neurogenic claudication -     triamcinolone acetonide (KENALOG-40) injection 40 mg; 1 mL (40 mg total) by Other route once. -     sodium chloride flush (NS) 0.9 % injection 10 mL; 10 mLs by Other route once. -     ropivacaine (PF) 2 mg/mL (0.2%) (NAROPIN) injection 10 mL; 10 mLs by Epidural route once. -     midazolam (VERSED) 5 MG/5ML injection 5 mg; Inject 5 mLs (5 mg total) into the vein once. -     lidocaine (PF) (XYLOCAINE) 1 % injection 5 mL; Inject 5 mLs into the skin once. -     lactated ringers infusion 1,000 mL; Inject 1,000 mLs into the vein continuous. -     iopamidol (ISOVUE-M) 41 % intrathecal injection 20 mL; 20 mLs by Other route once as needed for contrast. -     Lumbar Epidural Injection; Future  Acute bilateral low back pain with right-sided sciatica -     Lumbar Epidural Injection  Other idiopathic scoliosis, thoracolumbar region  Chronic right-sided low back pain without sciatica  DDD (degenerative disc disease), lumbar -     triamcinolone acetonide (KENALOG-40) injection 40 mg; 1 mL (40 mg total) by Other route once. -     sodium chloride flush (NS) 0.9 % injection 10 mL; 10 mLs by Other route once. -     ropivacaine (PF) 2 mg/mL (0.2%) (NAROPIN) injection 10 mL; 10 mLs by Epidural route once. -     midazolam (VERSED) 5 MG/5ML injection 5 mg; Inject 5 mLs (5 mg total) into the vein once. -     lidocaine (PF) (XYLOCAINE) 1 % injection 5 mL; Inject 5 mLs into the skin once. -     lactated ringers infusion 1,000 mL; Inject 1,000 mLs into the vein continuous. -     iopamidol (ISOVUE-M) 41 % intrathecal injection 20 mL; 20 mLs by Other route once as needed for  contrast.        ----------------------------------------------------------------------------------------------------------------------  Problem List Items Addressed This Visit      Musculoskeletal and Integument   DDD (degenerative disc disease), lumbar   Relevant Medications   triamcinolone acetonide (KENALOG-40) injection 40 mg (Completed)   sodium chloride flush (NS) 0.9 % injection 10 mL (Completed)   ropivacaine (PF) 2 mg/mL (0.2%) (NAROPIN) injection 10 mL (Completed)   midazolam (VERSED) 5 MG/5ML injection 5 mg (Completed)   lidocaine (PF) (XYLOCAINE) 1 % injection 5 mL (Completed)   lactated ringers infusion 1,000 mL   iopamidol (ISOVUE-M) 41 % intrathecal injection 20 mL   Idiopathic scoliosis     Other   Right-sided low back pain without sciatica   Relevant Medications   triamcinolone acetonide (KENALOG-40) injection 40  mg (Completed)   Spinal stenosis of lumbar region - Primary   Relevant Medications   triamcinolone acetonide (KENALOG-40) injection 40 mg (Completed)   sodium chloride flush (NS) 0.9 % injection 10 mL (Completed)   ropivacaine (PF) 2 mg/mL (0.2%) (NAROPIN) injection 10 mL (Completed)   midazolam (VERSED) 5 MG/5ML injection 5 mg (Completed)   lidocaine (PF) (XYLOCAINE) 1 % injection 5 mL (Completed)   lactated ringers infusion 1,000 mL   iopamidol (ISOVUE-M) 41 % intrathecal injection 20 mL   Other Relevant Orders   Lumbar Epidural Injection    Other Visit Diagnoses    Acute bilateral low back pain with right-sided sciatica       Relevant Medications   triamcinolone acetonide (KENALOG-40) injection 40 mg (Completed)        ----------------------------------------------------------------------------------------------------------------------  1. Low back pain at multiple sites Based on her x-ray evaluation and her description of pain and clinical presentation we'll proceed with an L1-2 epidural steroid injection today. The risks and benefits  been once again reviewed with her in full detail and all questions answered. I have her return to clinic in 1 month for reevaluation and possible repeat injection at that time.  2. Chronic right-sided low back pain without sciatica   3. Acute bilateral low back pain with right-sided sciatica   ----------------------------------------------------------------------------------------------------------------------  I am having Brenda Cardenas maintain her calcium citrate-vitamin D, acetaminophen, ipratropium, Probiotic Product (ALIGN PO), Vitamin D3, ezetimibe, omeprazole, raloxifene, gabapentin, HYDROcodone-acetaminophen, Multiple Vitamins-Minerals (CENTRUM SILVER 50+WOMEN PO), and atenolol. We administered triamcinolone acetonide, sodium chloride flush, ropivacaine (PF) 2 mg/mL (0.2%), midazolam, lidocaine (PF), lactated ringers, and iopamidol. We will continue to administer denosumab.   Meds ordered this encounter  Medications  . triamcinolone acetonide (KENALOG-40) injection 40 mg  . sodium chloride flush (NS) 0.9 % injection 10 mL  . ropivacaine (PF) 2 mg/mL (0.2%) (NAROPIN) injection 10 mL  . midazolam (VERSED) 5 MG/5ML injection 5 mg  . lidocaine (PF) (XYLOCAINE) 1 % injection 5 mL  . lactated ringers infusion 1,000 mL  . iopamidol (ISOVUE-M) 41 % intrathecal injection 20 mL   Patient's Medications  New Prescriptions   No medications on file  Previous Medications   ACETAMINOPHEN (TYLENOL) 325 MG TABLET    Take 650 mg by mouth 2 (two) times daily. May take up to four  Per day/ 2 in the morning and 2 in the afternoon   ATENOLOL (TENORMIN) 25 MG TABLET    TAKE 1 TABLET BY MOUTH DAILY   CALCIUM CITRATE-VITAMIN D (CITRACAL+D) 315-200 MG-UNIT PER TABLET    Take 2 tablets by mouth daily.   CHOLECALCIFEROL (VITAMIN D3) 1000 UNITS CAPS    Take 1 capsule by mouth daily.   EZETIMIBE (ZETIA) 10 MG TABLET    TAKE 1 TABLET BY MOUTH DAILY   GABAPENTIN (NEURONTIN) 100 MG CAPSULE    gabapentin 100 mg  capsule   HYDROCODONE-ACETAMINOPHEN (NORCO/VICODIN) 5-325 MG TABLET    hydrocodone 5 mg-acetaminophen 325 mg tablet   IPRATROPIUM (ATROVENT) 0.06 % NASAL SPRAY    1 spray each side before each meal   MULTIPLE VITAMINS-MINERALS (CENTRUM SILVER 50+WOMEN PO)    Centrum Silver   OMEPRAZOLE (PRILOSEC) 20 MG CAPSULE    TAKE 1 CAPSULE BY MOUTH TWO TIMES DAILY 30-60 MINUTES BEFORE MEALS   PROBIOTIC PRODUCT (ALIGN PO)    Take by mouth daily.   RALOXIFENE (EVISTA) 60 MG TABLET    raloxifene 60 mg tablet  Modified Medications   No medications on file  Discontinued  Medications   No medications on file   ----------------------------------------------------------------------------------------------------------------------  Follow-up: Return for evaluation, procedure.  Procedure: L1-2 epidural steroid under fluoroscopic guidance and moderate sedation #1   Procedure: L1-2 LESI with fluoroscopic guidance and moderate sedation  NOTE: The risks, benefits, and expectations of the procedure have been discussed and explained to the patient who was understanding and in agreement with suggested treatment plan. No guarantees were made.  DESCRIPTION OF PROCEDURE: Lumbar epidural steroid injection with IV 2 mg Versed, EKG, blood pressure, pulse, and pulse oximetry monitoring. The procedure was performed with the patient in the prone position under fluoroscopic guidance. I injected subcutaneous lidocaine overlying the L1 to site after its fluoroscopic identifictation.  Using strict aseptic technique, I then advanced an 18-gauge Tuohy epidural needle in the midline using interlaminar approach via loss-of-resistance to saline technique. There was negative aspiration for heme or  CSF.  I then confirmed position with both AP and Lateral fluoroscan. 2 cc of Isovue were injected and a  total of 5 mL of Preservative-Free normal saline mixed with 40 mg of Kenalog and 1cc Ropicaine 0.2 percent were injected incrementally via the   epidurally placed needle. The needle was removed. The patient tolerated the injection well and was convalesced and discharged to home in stable condition. Should the patient have any post procedure difficulty they have been instructed on how to contact us for assistance.     Molli Barrows, MD

## 2017-05-03 ENCOUNTER — Other Ambulatory Visit: Payer: Self-pay

## 2017-05-03 ENCOUNTER — Telehealth: Payer: Self-pay

## 2017-05-03 NOTE — Telephone Encounter (Signed)
Spoke with pt and she stated that she does not need a copy of the results.

## 2017-05-03 NOTE — Telephone Encounter (Signed)
LMTCB. Need to let pt know that we received her x ray results and see if she still would like for [place] to send her a copy.

## 2017-05-12 ENCOUNTER — Encounter: Payer: Self-pay | Admitting: Internal Medicine

## 2017-05-26 ENCOUNTER — Ambulatory Visit (INDEPENDENT_AMBULATORY_CARE_PROVIDER_SITE_OTHER): Payer: Medicare Other | Admitting: *Deleted

## 2017-05-26 DIAGNOSIS — M810 Age-related osteoporosis without current pathological fracture: Secondary | ICD-10-CM | POA: Diagnosis not present

## 2017-05-26 MED ORDER — DENOSUMAB 60 MG/ML ~~LOC~~ SOLN
60.0000 mg | Freq: Once | SUBCUTANEOUS | Status: AC
  Administered 2017-05-26: 60 mg via SUBCUTANEOUS

## 2017-05-26 NOTE — Progress Notes (Addendum)
Patient presented for Prolia injection to left  Arm , was given SQ patient tolerated injection with no signs of distress or concern for injection.   I have reviewed the above information and agree with above.   Deborra Medina, MD

## 2017-06-01 ENCOUNTER — Telehealth: Payer: Self-pay | Admitting: Internal Medicine

## 2017-06-01 NOTE — Telephone Encounter (Signed)
Left pt message asking to call Ebony Hail back directly at [phone] to schedule AWV. Thanks!  *NOTE* Last AWV 06/23/16; please scheduled 9/27 or after

## 2017-06-03 ENCOUNTER — Ambulatory Visit: Payer: Medicare Other | Attending: Anesthesiology | Admitting: Anesthesiology

## 2017-06-03 ENCOUNTER — Encounter: Payer: Self-pay | Admitting: Anesthesiology

## 2017-06-03 VITALS — BP 150/74 | HR 64 | Temp 97.8°F | Resp 18 | Ht 66.0 in | Wt 159.0 lb

## 2017-06-03 DIAGNOSIS — M48061 Spinal stenosis, lumbar region without neurogenic claudication: Secondary | ICD-10-CM | POA: Diagnosis not present

## 2017-06-03 DIAGNOSIS — M5441 Lumbago with sciatica, right side: Secondary | ICD-10-CM

## 2017-06-03 DIAGNOSIS — M4125 Other idiopathic scoliosis, thoracolumbar region: Secondary | ICD-10-CM | POA: Diagnosis not present

## 2017-06-03 DIAGNOSIS — M5136 Other intervertebral disc degeneration, lumbar region: Secondary | ICD-10-CM | POA: Insufficient documentation

## 2017-06-03 DIAGNOSIS — M545 Low back pain, unspecified: Secondary | ICD-10-CM

## 2017-06-03 DIAGNOSIS — M51369 Other intervertebral disc degeneration, lumbar region without mention of lumbar back pain or lower extremity pain: Secondary | ICD-10-CM

## 2017-06-03 DIAGNOSIS — G8929 Other chronic pain: Secondary | ICD-10-CM

## 2017-06-03 NOTE — Progress Notes (Signed)
Safety precautions to be maintained throughout the outpatient stay will include: orient to surroundings, keep bed in low position, maintain call bell within reach at all times, provide assistance with transfer out of bed and ambulation.  

## 2017-06-03 NOTE — Patient Instructions (Signed)
Epidural Steroid Injection Patient Information  Description: The epidural space surrounds the nerves as they exit the spinal cord.  In some patients, the nerves can be compressed and inflamed by a bulging disc or a tight spinal canal (spinal stenosis).  By injecting steroids into the epidural space, we can bring irritated nerves into direct contact with a potentially helpful medication.  These steroids act directly on the irritated nerves and can reduce swelling and inflammation which often leads to decreased pain.  Epidural steroids may be injected anywhere along the spine and from the neck to the low back depending upon the location of your pain.   After numbing the skin with local anesthetic (like Novocaine), a small needle is passed into the epidural space slowly.  You may experience a sensation of pressure while this is being done.  The entire block usually last less than 10 minutes.  Conditions which may be treated by epidural steroids:   Low back and leg pain  Neck and arm pain  Spinal stenosis  Post-laminectomy syndrome  Herpes zoster (shingles) pain  Pain from compression fractures  Preparation for the injection:  1. Do not eat any solid food or dairy products within 8 hours of your appointment.  2. You may drink clear liquids up to 3 hours before appointment.  Clear liquids include water, black coffee, juice or soda.  No milk or cream please. 3. You may take your regular medication, including pain medications, with a sip of water before your appointment  Diabetics should hold regular insulin (if taken separately) and take 1/2 normal NPH dos the morning of the procedure.  Carry some sugar containing items with you to your appointment. 4. A driver must accompany you and be prepared to drive you home after your procedure.  5. Bring all your current medications with your. 6. An IV may be inserted and sedation may be given at the discretion of the physician.   7. A blood pressure  cuff, EKG and other monitors will often be applied during the procedure.  Some patients may need to have extra oxygen administered for a short period. 8. You will be asked to provide medical information, including your allergies, prior to the procedure.  We must know immediately if you are taking blood thinners (like Coumadin/Warfarin)  Or if you are allergic to IV iodine contrast (dye). We must know if you could possible be pregnant.  Possible side-effects:  Bleeding from needle site  Infection (rare, may require surgery)  Nerve injury (rare)  Numbness & tingling (temporary)  Difficulty urinating (rare, temporary)  Spinal headache ( a headache worse with upright posture)  Light -headedness (temporary)  Pain at injection site (several days)  Decreased blood pressure (temporary)  Weakness in arm/leg (temporary)  Pressure sensation in back/neck (temporary)  Call if you experience:  Fever/chills associated with headache or increased back/neck pain.  Headache worsened by an upright position.  New onset weakness or numbness of an extremity below the injection site  Hives or difficulty breathing (go to the emergency room)  Inflammation or drainage at the infection site  Severe back/neck pain  Any new symptoms which are concerning to you  Please note:  Although the local anesthetic injected can often make your back or neck feel good for several hours after the injection, the pain will likely return.  It takes 3-7 days for steroids to work in the epidural space.  You may not notice any pain relief for at least that one week.    If effective, we will often do a series of three injections spaced 3-6 weeks apart to maximally decrease your pain.  After the initial series, we generally will wait several months before considering a repeat injection of the same type.  If you have any questions, please call [phone] [facility] 

## 2017-06-04 NOTE — Progress Notes (Signed)
Subjective:  Patient ID: Brenda Cardenas, female    DOB: 1932/06/23  Age: 83 y.o. MRN: [id number]  CC: Back Pain (lower)   Procedure:  None  HPI Brenda Cardenas presents for reevaluation today. She is last seen a month ago and had an epidural steroid at that time. She feels that her pain is markedly improved. She is having several days a week when she has minimal to no pain but still has pain with prolonged standing and certain activity such as cleaning dishes. The pain is described as a gnawing aching pain in the low back with occasional radiation into the lower legs. She has occasional calf wrapping. She has a fatigue feeling with prolonged standing. Overall she describes this as a 50% reduction in her low back pain. Her bowel bladder function has been good.  Outpatient Medications Prior to Visit  Medication Sig Dispense Refill  . acetaminophen (TYLENOL) 325 MG tablet Take 650 mg by mouth 2 (two) times daily. May take up to four  Per day/ 2 in the morning and 2 in the afternoon    . atenolol (TENORMIN) 25 MG tablet TAKE 1 TABLET BY MOUTH DAILY 90 tablet 1  . calcium citrate-vitamin D (CITRACAL+D) 315-200 MG-UNIT per tablet Take 2 tablets by mouth daily.    . Cholecalciferol (VITAMIN D3) 1000 units CAPS Take 1 capsule by mouth daily.    Marland Kitchen ezetimibe (ZETIA) 10 MG tablet TAKE 1 TABLET BY MOUTH DAILY 30 tablet 5  . Multiple Vitamins-Minerals (CENTRUM SILVER 50+WOMEN PO) Centrum Silver    . omeprazole (PRILOSEC) 20 MG capsule TAKE 1 CAPSULE BY MOUTH TWO TIMES DAILY 30-60 MINUTES BEFORE MEALS 60 capsule 3  . Probiotic Product (ALIGN PO) Take by mouth daily.    Marland Kitchen gabapentin (NEURONTIN) 100 MG capsule gabapentin 100 mg capsule    . HYDROcodone-acetaminophen (NORCO/VICODIN) 5-325 MG tablet hydrocodone 5 mg-acetaminophen 325 mg tablet    . ipratropium (ATROVENT) 0.06 % nasal spray 1 spray each side before each meal (Patient not taking: Reported on 04/30/2017) 15 mL 12  . raloxifene (EVISTA) 60 MG tablet  raloxifene 60 mg tablet     Facility-Administered Medications Prior to Visit  Medication Dose Route Frequency Provider Last Rate Last Dose  . denosumab (PROLIA) injection 60 mg  60 mg Subcutaneous Once Einar Pheasant, MD       ROS Review of Systems  Cardiac: No angina Pulmonary: No shortness of breath GI: No constipation  Objective:  BP (!) 150/74   Pulse 64   Temp 97.8 F (36.6 C) (Oral)   Resp 18   Ht 5\' 6"  (1.676 m)   Wt 159 lb (72.1 kg)   SpO2 100%   BMI 25.66 kg/m    BP Readings from Last 3 Encounters:  06/03/17 (!) 150/74  04/30/17 (!) 162/82  04/20/17 (!) 156/67     Wt Readings from Last 3 Encounters:  06/03/17 159 lb (72.1 kg)  04/30/17 159 lb (72.1 kg)  04/20/17 159 lb (72.1 kg)     Physical Exam Pt is alert and oriented PERRL EOMI HEART IS RRR no murmur or rub LCTA no wheezing or rhales MUSCULOSKELETAL she has some paraspinous muscle tenderness but no overt trigger points. Her strength in the lower extremities is good without significant change. She still has a significant scoliotic deformity of the lumbar region in her muscle tone and bulk is at baseline  Labs  No results found for: HGBA1C Lab Results  Component Value Date   LDLCALC 107 (H)  12/14/2016   CREATININE 0.95 07/13/2016    -------------------------------------------------------------------------------------------------------------------- Lab Results  Component Value Date   WBC 7.8 08/13/2016   HGB 12.9 08/13/2016   HCT 39.0 08/13/2016   PLT 186.0 08/13/2016   GLUCOSE 90 07/13/2016   CHOL 180 12/14/2016   TRIG 87.0 12/14/2016   HDL 55.50 12/14/2016   LDLCALC 107 (H) 12/14/2016   ALT 13 07/13/2016   AST 18 07/13/2016   NA 138 07/13/2016   K 4.4 07/13/2016   CL 103 07/13/2016   CREATININE 0.95 07/13/2016   BUN 20 07/13/2016   CO2 28 07/13/2016   TSH 0.78 12/14/2016     --------------------------------------------------------------------------------------------------------------------- Dg C-arm 1-60 Min-no Report  Result Date: 04/30/2017 Fluoroscopy was utilized by the requesting physician.  No radiographic interpretation.     Assessment & Plan:   Brenda Cardenas was seen today for back pain.  Diagnoses and all orders for this visit:  Acute bilateral low back pain with right-sided sciatica  Spinal stenosis of lumbar region without neurogenic claudication  Other idiopathic scoliosis, thoracolumbar region  Chronic right-sided low back pain without sciatica  DDD (degenerative disc disease), lumbar        ----------------------------------------------------------------------------------------------------------------------  Problem List Items Addressed This Visit      Musculoskeletal and Integument   DDD (degenerative disc disease), lumbar   Idiopathic scoliosis     Other   Right-sided low back pain without sciatica   Spinal stenosis of lumbar region    Other Visit Diagnoses    Acute bilateral low back pain with right-sided sciatica    -  Primary        ----------------------------------------------------------------------------------------------------------------------  1. Acute bilateral low back pain with right-sided sciatica We'll continue with her current regimen. By return to clinic in 1 month for repeat L1-L2 epidural steroid injection for therapeutic purpose. She seems to be responding favorably but we will defer on this for today. Continue with back stretching strengthening exercises  2. Spinal stenosis of lumbar region without neurogenic claudication As above  3. Other idiopathic scoliosis, thoracolumbar region As above  4. Chronic right-sided low back pain without sciatica As above  5. DDD (degenerative disc disease), lumbar As  above    ----------------------------------------------------------------------------------------------------------------------  I am having Brenda Cardenas maintain her calcium citrate-vitamin D, acetaminophen, ipratropium, Probiotic Product (ALIGN PO), Vitamin D3, ezetimibe, omeprazole, raloxifene, gabapentin, HYDROcodone-acetaminophen, Multiple Vitamins-Minerals (CENTRUM SILVER 50+WOMEN PO), and atenolol. We will continue to administer denosumab.   No orders of the defined types were placed in this encounter.  Patient's Medications  New Prescriptions   No medications on file  Previous Medications   ACETAMINOPHEN (TYLENOL) 325 MG TABLET    Take 650 mg by mouth 2 (two) times daily. May take up to four  Per day/ 2 in the morning and 2 in the afternoon   ATENOLOL (TENORMIN) 25 MG TABLET    TAKE 1 TABLET BY MOUTH DAILY   CALCIUM CITRATE-VITAMIN D (CITRACAL+D) 315-200 MG-UNIT PER TABLET    Take 2 tablets by mouth daily.   CHOLECALCIFEROL (VITAMIN D3) 1000 UNITS CAPS    Take 1 capsule by mouth daily.   EZETIMIBE (ZETIA) 10 MG TABLET    TAKE 1 TABLET BY MOUTH DAILY   GABAPENTIN (NEURONTIN) 100 MG CAPSULE    gabapentin 100 mg capsule   HYDROCODONE-ACETAMINOPHEN (NORCO/VICODIN) 5-325 MG TABLET    hydrocodone 5 mg-acetaminophen 325 mg tablet   IPRATROPIUM (ATROVENT) 0.06 % NASAL SPRAY    1 spray each side before each meal   MULTIPLE VITAMINS-MINERALS (CENTRUM  SILVER 50+WOMEN PO)    Centrum Silver   OMEPRAZOLE (PRILOSEC) 20 MG CAPSULE    TAKE 1 CAPSULE BY MOUTH TWO TIMES DAILY 30-60 MINUTES BEFORE MEALS   PROBIOTIC PRODUCT (ALIGN PO)    Take by mouth daily.   RALOXIFENE (EVISTA) 60 MG TABLET    raloxifene 60 mg tablet  Modified Medications   No medications on file  Discontinued Medications   No medications on file   ----------------------------------------------------------------------------------------------------------------------  Follow-up: Return in about 1 month (around 07/03/2017) for  procedure, evaluation.    Molli Barrows, MD

## 2017-06-07 ENCOUNTER — Other Ambulatory Visit: Payer: Self-pay | Admitting: Internal Medicine

## 2017-06-16 ENCOUNTER — Other Ambulatory Visit (INDEPENDENT_AMBULATORY_CARE_PROVIDER_SITE_OTHER): Payer: Medicare Other

## 2017-06-16 DIAGNOSIS — I10 Essential (primary) hypertension: Secondary | ICD-10-CM

## 2017-06-16 DIAGNOSIS — E559 Vitamin D deficiency, unspecified: Secondary | ICD-10-CM

## 2017-06-16 DIAGNOSIS — E785 Hyperlipidemia, unspecified: Secondary | ICD-10-CM

## 2017-06-16 DIAGNOSIS — R7301 Impaired fasting glucose: Secondary | ICD-10-CM | POA: Diagnosis not present

## 2017-06-16 LAB — HEMOGLOBIN A1C: HEMOGLOBIN A1C: 5.5 % (ref 4.6–6.5)

## 2017-06-16 LAB — COMPREHENSIVE METABOLIC PANEL
ALBUMIN: 4.1 g/dL (ref 3.5–5.2)
ALK PHOS: 70 U/L (ref 39–117)
ALT: 19 U/L (ref 0–35)
AST: 20 U/L (ref 0–37)
BUN: 23 mg/dL (ref 6–23)
CALCIUM: 9.2 mg/dL (ref 8.4–10.5)
CHLORIDE: 105 meq/L (ref 96–112)
CO2: 25 mEq/L (ref 19–32)
CREATININE: 0.84 mg/dL (ref 0.40–1.20)
GFR: 68.73 mL/min (ref >60.00)
Glucose, Bld: 104 mg/dL — ABNORMAL HIGH (ref 70–99)
POTASSIUM: 4.1 meq/L (ref 3.5–5.1)
Sodium: 139 mEq/L (ref 135–145)
TOTAL PROTEIN: 6.2 g/dL (ref 6.0–8.3)
Total Bilirubin: 0.6 mg/dL (ref 0.2–1.2)

## 2017-06-16 LAB — LDL CHOLESTEROL, DIRECT: LDL DIRECT: 103 mg/dL

## 2017-06-16 LAB — LIPID PANEL
CHOL/HDL RATIO: 3
Cholesterol: 186 mg/dL (ref 0–200)
HDL: 65.8 mg/dL (ref >39.00)
LDL Cholesterol: 95 mg/dL (ref 0–99)
NONHDL: 120.36
Triglycerides: 129 mg/dL (ref 0.0–149.0)
VLDL: 25.8 mg/dL (ref 0.0–40.0)

## 2017-06-16 LAB — MICROALBUMIN / CREATININE URINE RATIO
CREATININE, U: 147.4 mg/dL
MICROALB/CREAT RATIO: 1.1 mg/g (ref 0.0–30.0)
Microalb, Ur: 1.6 mg/dL (ref 0.0–1.9)

## 2017-06-16 LAB — VITAMIN D 25 HYDROXY (VIT D DEFICIENCY, FRACTURES): VITD: 33.39 ng/mL (ref 30.00–100.00)

## 2017-06-18 ENCOUNTER — Encounter: Payer: Self-pay | Admitting: Internal Medicine

## 2017-06-23 ENCOUNTER — Encounter: Payer: Self-pay | Admitting: Internal Medicine

## 2017-06-23 ENCOUNTER — Ambulatory Visit (INDEPENDENT_AMBULATORY_CARE_PROVIDER_SITE_OTHER): Payer: Medicare Other | Admitting: Internal Medicine

## 2017-06-23 DIAGNOSIS — N3090 Cystitis, unspecified without hematuria: Secondary | ICD-10-CM

## 2017-06-23 DIAGNOSIS — Z23 Encounter for immunization: Secondary | ICD-10-CM | POA: Diagnosis not present

## 2017-06-23 DIAGNOSIS — M810 Age-related osteoporosis without current pathological fracture: Secondary | ICD-10-CM | POA: Diagnosis not present

## 2017-06-23 DIAGNOSIS — N390 Urinary tract infection, site not specified: Secondary | ICD-10-CM | POA: Diagnosis not present

## 2017-06-23 DIAGNOSIS — I10 Essential (primary) hypertension: Secondary | ICD-10-CM | POA: Diagnosis not present

## 2017-06-23 DIAGNOSIS — M5136 Other intervertebral disc degeneration, lumbar region: Secondary | ICD-10-CM | POA: Diagnosis not present

## 2017-06-23 NOTE — Patient Instructions (Addendum)
I agree with using Premarin  cream on your urethra . Goodrx is a internet based application that will help you locate the best prices on medication .   You need to get half of your calcium needs through diet (1800 /2 = 900 mg) and the rest through your supplements    Your rash does appear to be from an insect bite,  But it is improving.    The ShingRx vaccine is now available in local pharmacies and is much more protective thant Zostavaxs,  It is therefore ADVISED for all interested adults over 50 to prevent shingles .  It requires two injections ( a booster after 1 to 6 months) .  You do not need another pertussis vaccination,  Just a Tetanus booster every 10 years.  Medicare does not pay for it, so you will save $$$ by getting it at a local pharmacy  All vaccines should be  Given either 2 weeks BEFORE  Or 2 weeks AFTER Any steroid injection or oral treatment with steroids

## 2017-06-23 NOTE — Telephone Encounter (Signed)
See order.

## 2017-06-23 NOTE — Telephone Encounter (Signed)
Orders

## 2017-06-23 NOTE — Progress Notes (Signed)
Subjective:  Patient ID: Brenda Cardenas, female    DOB: 12-03-1931  Age: 83 y.o. MRN: [id number]  CC: Diagnoses of Encounter for immunization, Essential hypertension, benign, DDD (degenerative disc disease), lumbar, Age related osteoporosis, unspecified pathological fracture presence, Recurrent UTI (urinary tract infection), and Recurrent cystitis were pertinent to this visit.  HPI Brenda Cardenas presents for follow up on multiple chronic issues .  Has a list of concerns:  Back Pain: secondary to DDD and scoliosis resulting in spinal stenosis.  manageed by Pain clinic. Dr Andree Elk  ,  Trigger point injection given above the SI joint  in July 2018  For throbbing pain,  Post procedure developed severe referred pain to the right hip .  Had an ESI  Done between L1-L2  On August 3rd ,  2nd one planned for Oct 24th.    Has pain with prolonged standing or walking., even to the grocery store causes extreme tiredness   Has L1-L2 compression fracuture by recent ER visit films,  Stenosis at L4-L5  . Dr Andree Elk offered her opioids bc she can't take NSAIDs,  But she has declined.   Using tylenol max dose three times daily.    Rash on back:  Dermatology managing .  Patient was offered doxycycline by Nehemiah Massed who diagnosed an insect bite,  but she refused .  So she is using Pandel steroid cream  but developed pain and blistering  so she went back the next day and was given  Bactroban, and Eucrisa to add .    Recurrent UTI:  Last UTI march treated with cipro. Has used fosfomycin in the past as well. Stoioff leaving,  Saw Cope and likes him.  Cope recommended estrogen cream only on the urethra .  Every 6 months.   Gets annual pelvics from her gynecologist   Taking a calcium supplement  2 pills daily   Had PT April through June for leg weakness ordered by [facility] PT,  modifeid after developing knee pain. Stretching .  Can now go up and down steps.   Walking around the walking track with her walker , had  height adjusted for back pain    Outpatient Medications Prior to Visit  Medication Sig Dispense Refill  . acetaminophen (TYLENOL) 325 MG tablet Take 650 mg by mouth 2 (two) times daily. May take up to four  Per day/ 2 in the morning and 2 in the afternoon    . atenolol (TENORMIN) 25 MG tablet TAKE 1 TABLET BY MOUTH DAILY 90 tablet 1  . calcium citrate-vitamin D (CITRACAL+D) 315-200 MG-UNIT per tablet Take 2 tablets by mouth daily.    . Cholecalciferol (VITAMIN D3) 1000 units CAPS Take 1 capsule by mouth daily.    Marland Kitchen ezetimibe (ZETIA) 10 MG tablet TAKE 1 TABLET BY MOUTH DAILY 30 tablet 3  . Multiple Vitamins-Minerals (CENTRUM SILVER 50+WOMEN PO) Centrum Silver    . omeprazole (PRILOSEC) 20 MG capsule TAKE 1 CAPSULE BY MOUTH TWO TIMES DAILY 30-60 MINUTES BEFORE MEALS 60 capsule 3  . Probiotic Product (ALIGN PO) Take by mouth daily.    . raloxifene (EVISTA) 60 MG tablet raloxifene 60 mg tablet    . gabapentin (NEURONTIN) 100 MG capsule gabapentin 100 mg capsule    . HYDROcodone-acetaminophen (NORCO/VICODIN) 5-325 MG tablet hydrocodone 5 mg-acetaminophen 325 mg tablet    . ipratropium (ATROVENT) 0.06 % nasal spray 1 spray each side before each meal (Patient not taking: Reported on 04/30/2017) 15 mL 12   Facility-Administered  Medications Prior to Visit  Medication Dose Route Frequency Provider Last Rate Last Dose  . denosumab (PROLIA) injection 60 mg  60 mg Subcutaneous Once Einar Pheasant, MD        Review of Systems;  Patient denies headache, fevers, malaise, unintentional weight loss, skin rash, eye pain, sinus congestion and sinus pain, sore throat, dysphagia,  hemoptysis , cough, dyspnea, wheezing, chest pain, palpitations, orthopnea, edema, abdominal pain, nausea, melena, diarrhea, constipation, flank pain, dysuria, hematuria, urinary  Frequency, nocturia, numbness, tingling, seizures,  Focal weakness, Loss of consciousness,  Tremor, insomnia, depression, anxiety, and suicidal ideation.       Objective:  BP 124/72 (BP Location: Left Arm, Patient Position: Sitting, Cuff Size: Normal)   Pulse 62   Temp 97.8 F (36.6 C) (Oral)   Resp 16   Ht 5\' 6"  (1.676 m)   Wt 162 lb (73.5 kg)   SpO2 97%   BMI 26.15 kg/m   BP Readings from Last 3 Encounters:  06/23/17 124/72  06/03/17 (!) 150/74  04/30/17 (!) 162/82    Wt Readings from Last 3 Encounters:  06/23/17 162 lb (73.5 kg)  06/03/17 159 lb (72.1 kg)  04/30/17 159 lb (72.1 kg)    General appearance: alert, cooperative and appears stated age Ears: normal TM's and external ear canals both ears Throat: lips, mucosa, and tongue normal; teeth and gums normal Neck: no adenopathy, no carotid bruit, supple, symmetrical, trachea midline and thyroid not enlarged, symmetric, no tenderness/mass/nodules Back: symmetric, no curvature. ROM normal. No CVA tenderness. Lungs: clear to auscultation bilaterally Heart: regular rate and rhythm, S1, S2 normal, no murmur, click, rub or gallop Abdomen: soft, non-tender; bowel sounds normal; no masses,  no organomegaly Pulses: 2+ and symmetric Skin: Skin color, texture, turgor normal. No rashes or lesions Lymph nodes: Cervical, supraclavicular, and axillary nodes normal.  Lab Results  Component Value Date   HGBA1C 5.5 06/16/2017    Lab Results  Component Value Date   CREATININE 0.84 06/16/2017   CREATININE 0.95 07/13/2016   CREATININE 0.78 06/15/2016    Lab Results  Component Value Date   WBC 7.8 08/13/2016   HGB 12.9 08/13/2016   HCT 39.0 08/13/2016   PLT 186.0 08/13/2016   GLUCOSE 104 (H) 06/16/2017   CHOL 186 06/16/2017   TRIG 129.0 06/16/2017   HDL 65.80 06/16/2017   LDLDIRECT 103.0 06/16/2017   LDLCALC 95 06/16/2017   ALT 19 06/16/2017   AST 20 06/16/2017   NA 139 06/16/2017   K 4.1 06/16/2017   CL 105 06/16/2017   CREATININE 0.84 06/16/2017   BUN 23 06/16/2017   CO2 25 06/16/2017   TSH 0.78 12/14/2016   HGBA1C 5.5 06/16/2017   MICROALBUR 1.6 06/16/2017     Dg C-arm 1-60 Min-no Report  Result Date: 04/30/2017 Fluoroscopy was utilized by the requesting physician.  No radiographic interpretation.    Assessment & Plan:   Problem List Items Addressed This Visit    DDD (degenerative disc disease), lumbar    Analgesic regimen outllined (non narcotic).  PT for strengthening has been completed for interim development of gluteal weakness       Essential hypertension, benign    Well controlled on current regimen. Renal function stable, no changes today.  Lab Results  Component Value Date   CREATININE 0.84 06/16/2017   Lab Results  Component Value Date   NA 139 06/16/2017   K 4.1 06/16/2017   CL 105 06/16/2017   CO2 25 06/16/2017  Osteoporosis    With  Prior Lumbar compression  fracture.  Continue Prolia .Reviewed use of calcium and Vit D supplements.  Continue Prolia.  Next injection due in late August 2018.  Last dexa in 2017 showed improved T scores compared to 20`4 [facility])       Recurrent cystitis    She was treated by Dr Jacqlyn Larsen in May for E Coli UTI resistant to cipro.  With fosfomycin .  Did not return for her 6 week follow up urine tests because her symptoms resolved. e      Recurrent UTI (urinary tract infection)    Occurring tw to three times per year.  Managed by [facility]        Other Visit Diagnoses    Encounter for immunization       Relevant Orders   Flu vaccine HIGH DOSE PF (Completed)     A total of 25 minutes of face to face time was spent with patient more than half of which was spent in counselling and coordination of care   I have discontinued Ms. Lesueur's ipratropium, gabapentin, and HYDROcodone-acetaminophen. I am also having her maintain her calcium citrate-vitamin D, acetaminophen, Probiotic Product (ALIGN PO), Vitamin D3, omeprazole, raloxifene, Multiple Vitamins-Minerals (CENTRUM SILVER 50+WOMEN PO), atenolol, and ezetimibe. We will continue to administer denosumab.  No orders of  the defined types were placed in this encounter.   Medications Discontinued During This Encounter  Medication Reason  . gabapentin (NEURONTIN) 100 MG capsule Patient has not taken in last 30 days  . HYDROcodone-acetaminophen (NORCO/VICODIN) 5-325 MG tablet Patient has not taken in last 30 days  . ipratropium (ATROVENT) 0.06 % nasal spray Patient has not taken in last 30 days    Follow-up: Return in about 6 months (around 12/21/2017).   Crecencio Mc, MD

## 2017-06-26 ENCOUNTER — Encounter: Payer: Self-pay | Admitting: Internal Medicine

## 2017-06-26 DIAGNOSIS — N390 Urinary tract infection, site not specified: Secondary | ICD-10-CM | POA: Insufficient documentation

## 2017-06-26 NOTE — Assessment & Plan Note (Addendum)
Occurring tw to three times per year.  Managed by [facility] . Urology added topical estrogen to urethra

## 2017-06-26 NOTE — Assessment & Plan Note (Addendum)
With  Prior Lumbar compression  fracture.  Continue Prolia .Reviewed use of calcium and Vit D supplements.  Continue Prolia.  Next injection due in late August 2018.  Last dexa in 2017 showed improved T scores compared to 20`4 [facility])

## 2017-06-26 NOTE — Assessment & Plan Note (Signed)
Analgesic regimen outllined (non narcotic).  PT for strengthening has been completed for interim development of gluteal weakness

## 2017-06-26 NOTE — Assessment & Plan Note (Signed)
She was treated by Dr Jacqlyn Larsen in May for E Coli UTI resistant to cipro.  With fosfomycin .  Did not return for her 6 week follow up urine tests because her symptoms resolved. e

## 2017-06-26 NOTE — Assessment & Plan Note (Signed)
Well controlled on current regimen. Renal function stable, no changes today.  Lab Results  Component Value Date   CREATININE 0.84 06/16/2017   Lab Results  Component Value Date   NA 139 06/16/2017   K 4.1 06/16/2017   CL 105 06/16/2017   CO2 25 06/16/2017

## 2017-07-07 NOTE — Telephone Encounter (Signed)
Left pt message asking to call Ebony Hail back directly at [phone] to schedule AWV. Thanks!  *NOTE* Last AWV 06/23/16; pt due anytime

## 2017-07-21 ENCOUNTER — Other Ambulatory Visit: Payer: Self-pay | Admitting: Anesthesiology

## 2017-07-21 ENCOUNTER — Ambulatory Visit (HOSPITAL_BASED_OUTPATIENT_CLINIC_OR_DEPARTMENT_OTHER): Payer: Medicare Other | Admitting: Anesthesiology

## 2017-07-21 ENCOUNTER — Encounter: Payer: Self-pay | Admitting: Anesthesiology

## 2017-07-21 ENCOUNTER — Ambulatory Visit
Admission: RE | Admit: 2017-07-21 | Discharge: 2017-07-21 | Disposition: A | Discharge status: Home / Self Care | Payer: Medicare Other | Source: Ambulatory Visit | Attending: Anesthesiology | Admitting: Anesthesiology

## 2017-07-21 VITALS — BP 155/88 | HR 68 | Temp 98.2°F | Resp 16 | Ht 66.0 in | Wt 164.0 lb

## 2017-07-21 DIAGNOSIS — M48061 Spinal stenosis, lumbar region without neurogenic claudication: Secondary | ICD-10-CM | POA: Diagnosis present

## 2017-07-21 DIAGNOSIS — M5136 Other intervertebral disc degeneration, lumbar region: Secondary | ICD-10-CM

## 2017-07-21 DIAGNOSIS — M5441 Lumbago with sciatica, right side: Secondary | ICD-10-CM | POA: Insufficient documentation

## 2017-07-21 DIAGNOSIS — M4125 Other idiopathic scoliosis, thoracolumbar region: Secondary | ICD-10-CM | POA: Diagnosis not present

## 2017-07-21 DIAGNOSIS — R52 Pain, unspecified: Secondary | ICD-10-CM

## 2017-07-21 DIAGNOSIS — M545 Low back pain, unspecified: Secondary | ICD-10-CM

## 2017-07-21 DIAGNOSIS — Z79899 Other long term (current) drug therapy: Secondary | ICD-10-CM | POA: Diagnosis not present

## 2017-07-21 DIAGNOSIS — M51369 Other intervertebral disc degeneration, lumbar region without mention of lumbar back pain or lower extremity pain: Secondary | ICD-10-CM

## 2017-07-21 DIAGNOSIS — G8929 Other chronic pain: Secondary | ICD-10-CM

## 2017-07-21 MED ORDER — LIDOCAINE HCL (PF) 1 % IJ SOLN
10.0000 mL | Freq: Once | INTRAMUSCULAR | Status: AC
  Administered 2017-07-21: 5 mL via SUBCUTANEOUS
  Filled 2017-07-21: qty 10

## 2017-07-21 MED ORDER — TRIAMCINOLONE ACETONIDE 40 MG/ML IJ SUSP
40.0000 mg | Freq: Once | INTRAMUSCULAR | Status: AC
  Administered 2017-07-21: 40 mg
  Filled 2017-07-21: qty 1

## 2017-07-21 MED ORDER — ROPIVACAINE HCL 2 MG/ML IJ SOLN
10.0000 mL | Freq: Once | INTRAMUSCULAR | Status: AC
  Administered 2017-07-21: 10 mL via EPIDURAL
  Filled 2017-07-21: qty 10

## 2017-07-21 MED ORDER — IOPAMIDOL (ISOVUE-M 200) INJECTION 41%
20.0000 mL | Freq: Once | INTRAMUSCULAR | Status: DC | PRN
  Administered 2017-07-21: 10 mL
  Filled 2017-07-21: qty 20

## 2017-07-21 MED ORDER — SODIUM CHLORIDE 0.9% FLUSH
10.0000 mL | Freq: Once | INTRAVENOUS | Status: AC
  Administered 2017-07-21: 10 mL

## 2017-07-21 NOTE — Patient Instructions (Signed)

## 2017-07-21 NOTE — Progress Notes (Signed)
Safety precautions to be maintained throughout the outpatient stay will include: orient to surroundings, keep bed in low position, maintain call bell within reach at all times, provide assistance with transfer out of bed and ambulation.  

## 2017-07-22 NOTE — Progress Notes (Signed)
Subjective:  Patient ID: Brenda Cardenas, female    DOB: 1931-10-19  Age: 83 y.o. MRN: [id number]  CC: Back Pain (low) and Hip Pain (travels around to pelvis)   Procedure L1-L2 lumbar epidural steroid under fluoroscopic guidance without sedation   HPI Brenda Cardenas presents for reevaluation today. She was last seen a few months ago and continues to complain of severe right lower back pain with radiation in the right lateral flank and hip region. She also gets some occasional pain radiating into the right anterior thigh down her right leg. In the past she has had epidural steroids to help with this and they've been quite effective for generally giving her 50-75% relief lasting several weeks. She's had previous trigger point injections and these of given her transient relief as well but not as effective as the epidural steroid injections. No change in lower extremity strength or function is noted at this time.  Outpatient Medications Prior to Visit  Medication Sig Dispense Refill  . acetaminophen (TYLENOL) 325 MG tablet Take 650 mg by mouth 2 (two) times daily. May take up to four  Per day/ 2 in the morning and 2 in the afternoon    . atenolol (TENORMIN) 25 MG tablet TAKE 1 TABLET BY MOUTH DAILY 90 tablet 1  . calcium citrate-vitamin D (CITRACAL+D) 315-200 MG-UNIT per tablet Take 2 tablets by mouth daily.    . Cholecalciferol (VITAMIN D3) 1000 units CAPS Take 1 capsule by mouth daily.    Marland Kitchen ezetimibe (ZETIA) 10 MG tablet TAKE 1 TABLET BY MOUTH DAILY 30 tablet 3  . Multiple Vitamins-Minerals (CENTRUM SILVER 50+WOMEN PO) Centrum Silver    . omeprazole (PRILOSEC) 20 MG capsule TAKE 1 CAPSULE BY MOUTH TWO TIMES DAILY 30-60 MINUTES BEFORE MEALS 60 capsule 3  . Probiotic Product (ALIGN PO) Take by mouth daily.    . raloxifene (EVISTA) 60 MG tablet raloxifene 60 mg tablet     Facility-Administered Medications Prior to Visit  Medication Dose Route Frequency Provider Last Rate Last Dose  . denosumab  (PROLIA) injection 60 mg  60 mg Subcutaneous Once Einar Pheasant, MD        Review of Systems CNS: No sedation or confusion Cardiac: No angina or palpitations GI: No constipation or abdominal pain  Objective:  BP (!) 155/88   Pulse 68   Temp 98.2 F (36.8 C) (Oral)   Resp 16   Ht 5\' 6"  (1.676 m)   Wt 164 lb (74.4 kg)   SpO2 100%   BMI 26.47 kg/m    BP Readings from Last 3 Encounters:  07/21/17 (!) 155/88  06/23/17 124/72  06/03/17 (!) 150/74     Wt Readings from Last 3 Encounters:  07/21/17 164 lb (74.4 kg)  06/23/17 162 lb (73.5 kg)  06/03/17 159 lb (72.1 kg)     Physical Exam Pt is alert and oriented PERRL EOMI HEART IS RRR no murmur or rub LCTA no wheezing or rhales MUSCULOSKELETAL reveals some persistent paraspinous muscle tenderness in the low thoracic high lumbar region. 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 Some paraspinous muscle tenderness is noted as well. Her strength appears to be at baseline. She still has radiating pain down into the right hip and buttock with a straight leg raise on the right side.  Labs  Lab Results  Component Value Date   HGBA1C 5.5 06/16/2017   Lab Results  Component Value Date   MICROALBUR 1.6  06/16/2017   LDLCALC 95 06/16/2017   CREATININE 0.84 06/16/2017    -------------------------------------------------------------------------------------------------------------------- Lab Results  Component Value Date   WBC 7.8 08/13/2016   HGB 12.9 08/13/2016   HCT 39.0 08/13/2016   PLT 186.0 08/13/2016   GLUCOSE 104 (H) 06/16/2017   CHOL 186 06/16/2017   TRIG 129.0 06/16/2017   HDL 65.80 06/16/2017   LDLDIRECT 103.0 06/16/2017   LDLCALC 95 06/16/2017   ALT 19 06/16/2017   AST 20 06/16/2017   NA 139 06/16/2017   K 4.1 06/16/2017   CL 105 06/16/2017   CREATININE 0.84 06/16/2017   BUN 23 06/16/2017   CO2 25 06/16/2017   TSH 0.78 12/14/2016   HGBA1C 5.5  06/16/2017   MICROALBUR 1.6 06/16/2017    --------------------------------------------------------------------------------------------------------------------- Dg C-arm 1-60 Min-no Report  Result Date: 07/21/2017 Fluoroscopy was utilized by the requesting physician.  No radiographic interpretation.     Assessment & Plan:   Brenda Cardenas was seen today for back pain and hip pain.  Diagnoses and all orders for this visit:  Acute bilateral low back pain with right-sided sciatica -     Lumbar Epidural Injection; Future  Spinal stenosis of lumbar region without neurogenic claudication -     triamcinolone acetonide (KENALOG-40) injection 40 mg; 1 mL (40 mg total) by Other route once. -     sodium chloride flush (NS) 0.9 % injection 10 mL; 10 mLs by Other route once. -     ropivacaine (PF) 2 mg/mL (0.2%) (NAROPIN) injection 10 mL; 10 mLs by Epidural route once. -     lidocaine (PF) (XYLOCAINE) 1 % injection 10 mL; Inject 10 mLs into the skin once. -     iopamidol (ISOVUE-M) 41 % intrathecal injection 20 mL; 20 mLs by Other route once as needed for contrast. -     Lumbar Epidural Injection -     Lumbar Epidural Injection; Future  Other idiopathic scoliosis, thoracolumbar region  Chronic right-sided low back pain without sciatica  DDD (degenerative disc disease), lumbar  Low back pain at multiple sites        ----------------------------------------------------------------------------------------------------------------------  Problem List Items Addressed This Visit      Unprioritized   DDD (degenerative disc disease), lumbar   Relevant Medications   triamcinolone acetonide (KENALOG-40) injection 40 mg (Completed)   Idiopathic scoliosis   Right-sided low back pain without sciatica   Relevant Medications   triamcinolone acetonide (KENALOG-40) injection 40 mg (Completed)   Spinal stenosis of lumbar region   Relevant Medications   triamcinolone acetonide (KENALOG-40) injection 40  mg (Completed)   sodium chloride flush (NS) 0.9 % injection 10 mL (Completed)   ropivacaine (PF) 2 mg/mL (0.2%) (NAROPIN) injection 10 mL (Completed)   lidocaine (PF) (XYLOCAINE) 1 % injection 10 mL (Completed)   iopamidol (ISOVUE-M) 41 % intrathecal injection 20 mL   Other Relevant Orders   Lumbar Epidural Injection    Other Visit Diagnoses    Acute bilateral low back pain with right-sided sciatica    -  Primary   Relevant Medications   triamcinolone acetonide (KENALOG-40) injection 40 mg (Completed)   Other Relevant Orders   Lumbar Epidural Injection   Low back pain at multiple sites       Relevant Medications   triamcinolone acetonide (KENALOG-40) injection 40 mg (Completed)        ----------------------------------------------------------------------------------------------------------------------  1. Acute bilateral low back pain with right-sided sciatica We'll proceed with a repeat lumbar epidural steroid and would do this at the L1-2 level.  This was done previously gave her 50-70% improvement lasting 4-6 weeks. We'll have return to clinic in 2 months for reevaluation possible repeat injection at that time. We've talked about some stretching strengthening exercises as well to see if this could help with her leg and low back pain. - Lumbar Epidural Injection; Future  2. Spinal stenosis of lumbar region without neurogenic claudication As above - triamcinolone acetonide (KENALOG-40) injection 40 mg; 1 mL (40 mg total) by Other route once. - sodium chloride flush (NS) 0.9 % injection 10 mL; 10 mLs by Other route once. - ropivacaine (PF) 2 mg/mL (0.2%) (NAROPIN) injection 10 mL; 10 mLs by Epidural route once. - lidocaine (PF) (XYLOCAINE) 1 % injection 10 mL; Inject 10 mLs into the skin once. - iopamidol (ISOVUE-M) 41 % intrathecal injection 20 mL; 20 mLs by Other route once as needed for contrast. - Lumbar Epidural Injection - Lumbar Epidural Injection; Future  3. Other  idiopathic scoliosis, thoracolumbar region As above  4. Chronic right-sided low back pain without sciatica   5. DDD (degenerative disc disease), lumbar   6. Low back pain at multiple sites     ----------------------------------------------------------------------------------------------------------------------  I am having Brenda Cardenas maintain her calcium citrate-vitamin D, acetaminophen, Probiotic Product (ALIGN PO), Vitamin D3, omeprazole, raloxifene, Multiple Vitamins-Minerals (CENTRUM SILVER 50+WOMEN PO), atenolol, and ezetimibe. We administered triamcinolone acetonide, sodium chloride flush, ropivacaine (PF) 2 mg/mL (0.2%), lidocaine (PF), and iopamidol. We will continue to administer denosumab.   Meds ordered this encounter  Medications  . triamcinolone acetonide (KENALOG-40) injection 40 mg  . sodium chloride flush (NS) 0.9 % injection 10 mL  . ropivacaine (PF) 2 mg/mL (0.2%) (NAROPIN) injection 10 mL  . lidocaine (PF) (XYLOCAINE) 1 % injection 10 mL  . iopamidol (ISOVUE-M) 41 % intrathecal injection 20 mL   Patient's Medications  New Prescriptions   No medications on file  Previous Medications   ACETAMINOPHEN (TYLENOL) 325 MG TABLET    Take 650 mg by mouth 2 (two) times daily. May take up to four  Per day/ 2 in the morning and 2 in the afternoon   ATENOLOL (TENORMIN) 25 MG TABLET    TAKE 1 TABLET BY MOUTH DAILY   CALCIUM CITRATE-VITAMIN D (CITRACAL+D) 315-200 MG-UNIT PER TABLET    Take 2 tablets by mouth daily.   CHOLECALCIFEROL (VITAMIN D3) 1000 UNITS CAPS    Take 1 capsule by mouth daily.   EZETIMIBE (ZETIA) 10 MG TABLET    TAKE 1 TABLET BY MOUTH DAILY   MULTIPLE VITAMINS-MINERALS (CENTRUM SILVER 50+WOMEN PO)    Centrum Silver   OMEPRAZOLE (PRILOSEC) 20 MG CAPSULE    TAKE 1 CAPSULE BY MOUTH TWO TIMES DAILY 30-60 MINUTES BEFORE MEALS   PROBIOTIC PRODUCT (ALIGN PO)    Take by mouth daily.   RALOXIFENE (EVISTA) 60 MG TABLET    raloxifene 60 mg tablet  Modified  Medications   No medications on file  Discontinued Medications   No medications on file   ----------------------------------------------------------------------------------------------------------------------  Follow-up: Return for procedure.  Procedure: Lumbar epidural steroid at L1-2 under fluoroscopic guidance without sedation  Procedure: L1-2 LESI with fluoroscopic guidance and moderate sedation  NOTE: The risks, benefits, and expectations of the procedure have been discussed and explained to the patient who was understanding and in agreement with suggested treatment plan. No guarantees were made.  DESCRIPTION OF PROCEDURE: Lumbar epidural steroid injection with no IV Versed, EKG, blood pressure, pulse, and pulse oximetry monitoring. The procedure was performed with the patient in the  prone position under fluoroscopic guidance. I injected subcutaneous lidocaine overlying the L1-2  site after its fluoroscopic identifictation.  Using strict aseptic technique, I then advanced an 18-gauge Tuohy epidural needle in the midline using interlaminar approach via loss-of-resistance to saline technique. There was negative aspiration for heme or  CSF.  I then confirmed position with both AP and Lateral fluoroscan. 2 cc of Isovue were injected and a  total of 5 mL of Preservative-Free normal saline mixed with 40 mg of Kenalog and 1cc Ropicaine 0.2 percent were injected incrementally via the  epidurally placed needle. The needle was removed. The patient tolerated the injection well and was convalesced and discharged to home in stable condition. Should the patient have any post procedure difficulty they have been instructed on how to contact us for assistance.    Molli Barrows, MD

## 2017-09-01 ENCOUNTER — Other Ambulatory Visit: Payer: Self-pay | Admitting: Internal Medicine

## 2017-09-07 ENCOUNTER — Other Ambulatory Visit: Payer: Self-pay | Admitting: Internal Medicine

## 2017-09-24 ENCOUNTER — Ambulatory Visit: Payer: Medicare Other | Admitting: Podiatry

## 2017-09-24 ENCOUNTER — Ambulatory Visit (INDEPENDENT_AMBULATORY_CARE_PROVIDER_SITE_OTHER): Payer: Medicare Other

## 2017-09-24 VITALS — BP 118/64 | HR 67 | Temp 98.0°F | Resp 15 | Ht 66.0 in | Wt 160.0 lb

## 2017-09-24 DIAGNOSIS — Z0000 Encounter for general adult medical examination without abnormal findings: Secondary | ICD-10-CM | POA: Diagnosis not present

## 2017-09-24 DIAGNOSIS — Z1331 Encounter for screening for depression: Secondary | ICD-10-CM

## 2017-09-24 NOTE — Progress Notes (Signed)
Subjective:   Brenda Cardenas is a 83 y.o. female who presents for Medicare Annual (Subsequent) preventive examination.  Review of Systems:  No ROS.  Medicare Wellness Visit. Additional risk factors are reflected in the social history.  Cardiac Risk Factors include: advanced age (>24men, >53 women);hypertension     Objective:     Vitals: BP 118/64 (BP Location: Left Arm, Patient Position: Sitting, Cuff Size: Normal)   Pulse 67   Temp 98 F (36.7 C) (Oral)   Resp 15   Ht 5\' 6"  (1.676 m)   Wt 160 lb (72.6 kg)   SpO2 98%   BMI 25.82 kg/m   Body mass index is 25.82 kg/m.  Advanced Directives 09/24/2017 07/21/2017 06/03/2017 04/30/2017 04/20/2017 02/16/2017 06/23/2016  Does Patient Have a Medical Advance Directive? Yes Yes Yes Yes Yes Yes Yes  Type of Paramedic of [place];Living will [place];Living will - Living will Living will [place];Living will [place];Living will  Does patient want to make changes to medical advance directive? No - Patient declined No - Patient declined - - - - -  Copy of [place] in Chart? Yes Yes - - - - Yes    Tobacco Social History   Tobacco Use  Smoking Status Never Smoker  Smokeless Tobacco Never Used     Counseling given: Not Answered   Clinical Intake:  Pre-visit preparation completed: Yes  Pain : No/denies pain     Nutritional Status: BMI 25 -29 Overweight Diabetes: No  How often do you need to have someone help you when you read instructions, pamphlets, or other written materials from your doctor or pharmacy?: 1 - Never  Interpreter Needed?: No     Past Medical History:  Diagnosis Date  . Arthritis   . Chronic kidney disease   . Colon polyps   . GERD (gastroesophageal reflux disease)   . Hyperlipidemia   . Hypertension    Past Surgical History:  Procedure Laterality Date  . APPENDECTOMY  1959  . CHOLECYSTECTOMY  1995    . JOINT REPLACEMENT Right July 2009   [facility]   . TONSILLECTOMY AND ADENOIDECTOMY     Family History  Problem Relation Age of Onset  . Heart disease Mother   . Arthritis Mother   . Diabetes Father    Social History   Socioeconomic History  . Marital status: Single    Spouse name: None  . Number of children: None  . Years of education: None  . Highest education level: None  Social Needs  . Financial resource strain: None  . Food insecurity - worry: None  . Food insecurity - inability: None  . Transportation needs - medical: None  . Transportation needs - non-medical: None  Occupational History  . None  Tobacco Use  . Smoking status: Never Smoker  . Smokeless tobacco: Never Used  Substance and Sexual Activity  . Alcohol use: No    Alcohol/week: 0.0 oz  . Drug use: No  . Sexual activity: No  Other Topics Concern  . None  Social History Narrative  . None    Outpatient Encounter Medications as of 09/24/2017  Medication Sig  . acetaminophen (TYLENOL) 325 MG tablet Take 650 mg by mouth 2 (two) times daily. May take up to four  Per day/ 2 in the morning and 2 in the afternoon  . atenolol (TENORMIN) 25 MG tablet TAKE 1 TABLET BY MOUTH DAILY  . calcium  citrate-vitamin D (CITRACAL+D) 315-200 MG-UNIT per tablet Take 2 tablets by mouth daily.  . Cholecalciferol (VITAMIN D3) 1000 units CAPS Take 1 capsule by mouth daily.  Marland Kitchen CRANBERRY PO Take 1 tablet by mouth.  . ezetimibe (ZETIA) 10 MG tablet TAKE 1 TABLET BY MOUTH DAILY  . Multiple Vitamins-Minerals (CENTRUM SILVER 50+WOMEN PO) Centrum Silver  . omeprazole (PRILOSEC) 20 MG capsule TAKE ONE CAPSULE BY MOUTH TWICE DAILY 30-60 MINUTES BEFORE MEALS  . Probiotic Product (ALIGN PO) Take by mouth daily.  . raloxifene (EVISTA) 60 MG tablet raloxifene 60 mg tablet   Facility-Administered Encounter Medications as of 09/24/2017  Medication  . denosumab (PROLIA) injection 60 mg    Activities of Daily Living In your present state  of health, do you have any difficulty performing the following activities: 09/24/2017  Hearing? N  Vision? N  Difficulty concentrating or making decisions? N  Walking or climbing stairs? Y  Dressing or bathing? N  Doing errands, shopping? N  Preparing Food and eating ? N  Using the Toilet? N  In the past six months, have you accidently leaked urine? N  Comment Followed by [facility], Dr. Jacqlyn Larsen  Do you have problems with loss of bowel control? N  Managing your Medications? N  Managing your Finances? N  Housekeeping or managing your Housekeeping? Y  Comment Once a month housekeeping assists  Some recent data might be hidden    Patient Care Team: Crecencio Mc, MD as PCP - General (Internal Medicine)    Assessment:   This is a routine wellness examination for Brenda Cardenas. The goal of the wellness visit is to assist the patient how to close the gaps in care and create a preventative care plan for the patient.   The roster of all physicians providing medical care to patient is listed in the Snapshot section of the chart.  Taking calcium VIT D as appropriate/Osteoporosis reviewed.  Prolia injections currently administered every 6 months.  Safety issues reviewed; Smoke and carbon monoxide detectors in the home. No firearms in the home.  Wears seatbelts when driving or riding with others. Patient does wear sunscreen or protective clothing when in direct sunlight. No violence in the home.  Depression- PHQ 2 &9 complete.  No signs/symptoms or verbal communication regarding little pleasure in doing things, feeling down, depressed or hopeless. No changes in sleeping, energy, eating, concentrating.  No thoughts of self harm or harm towards others.  Time spent on this topic is 8 minutes.   Patient is alert, normal appearance, oriented to person/place/and time.  Correctly identified the president of the [place], recall of 3/3 words, and performing simple calculations. Displays appropriate judgement  and can read correct time from watch face.   No new identified risk were noted.  No failures at ADL's or IADL's.  Ambulates with cane and/or walker as needed for balance.  BMI- discussed the importance of a healthy diet, water intake and the benefits of aerobic exercise. Educational material provided.   24 hour diet recall: Breakfast: cereal, yogurt Lunch: eggs, cheesy potatoes Dinner: chicken, mashed potatoes, green beans Snack: ice cream Low carb foods encouraged   Daily fluid intake: 1 cups of caffeine, 48 ounces of water  Dental- every 6 months.  Dr. Albesa Seen.  Eye- Visual acuity not assessed per patient preference since they have regular follow up with the ophthalmologist.  Wears corrective lenses.  Sleep patterns- Sleeps 7-8 hours at night.  Wakes feeling rested.  Health maintenance gaps- closed.  Patient Concerns:  None at this time. Follow up with PCP as needed.  Exercise Activities and Dietary recommendations Current Exercise Habits: Home exercise routine, Type of exercise: walking;calisthenics(chair exercises), Time (Minutes): 30, Frequency (Times/Week): 7, Weekly Exercise (Minutes/Week): 210, Intensity: Mild  Goals    . Increase physical activity     Walk for exercise 5 days weekly, 30 minutes as tolerated       Fall Risk Fall Risk  09/24/2017 07/21/2017 06/03/2017 04/30/2017 04/20/2017  Falls in the past year? No No No No No  Comment - - - - -  Number falls in past yr: - - - - -  Injury with Fall? - - - - -  Comment - - - - -  Risk Factor Category  - - - - -  Risk for fall due to : - - - - -  Risk for fall due to: Comment - - - - -  Follow up - - - - -   Depression Screen PHQ 2/9 Scores 09/24/2017 07/21/2017 06/03/2017 04/30/2017  PHQ - 2 Score 0 0 0 0  PHQ- 9 Score 0 - - -  Exception Documentation - - - -     Cognitive Function MMSE - Mini Mental State Exam 09/24/2017 06/23/2016  Orientation to time 5 5  Orientation to Place 5 5  Registration 3 3   Attention/ Calculation 5 5  Recall 3 3  Language- name 2 objects 2 2  Language- repeat 1 1  Language- follow 3 step command 3 3  Language- read & follow direction 1 1  Write a sentence 1 1  Copy design 1 1  Total score 30 30        Immunization History  Administered Date(s) Administered  . Influenza Whole 06/15/2013  . Influenza, High Dose Seasonal PF 06/24/2015, 06/22/2016, 06/23/2017  . Influenza,inj,Quad PF,6+ Mos 06/18/2014  . Pneumococcal Conjugate-13 06/18/2014  . Pneumococcal Polysaccharide-23 06/15/2013  . Tdap 09/28/2008  . Zoster 01/05/2008    Screening Tests Health Maintenance  Topic Date Due  . TETANUS/TDAP  09/28/2018  . INFLUENZA VACCINE  Completed  . DEXA SCAN  Completed  . PNA vac Low Risk Adult  Completed       Plan:    End of life planning; Advance aging; Advanced directives discussed. Copy of current HCPOA/Living Will on file.    I have personally reviewed and noted the following in the patient's chart:   . Medical and social history . Use of alcohol, tobacco or illicit drugs  . Current medications and supplements . Functional ability and status . Nutritional status . Physical activity . Advanced directives . List of other physicians . Hospitalizations, surgeries, and ER visits in previous 12 months . Vitals . Screenings to include cognitive, depression, and falls . Referrals and appointments  In addition, I have reviewed and discussed with patient certain preventive protocols, quality metrics, and best practice recommendations. A written personalized care plan for preventive services as well as general preventive health recommendations were provided to patient.     OBrien-Blaney, Yelitza Reach L, LPN  [id number]   I have reviewed the above information and agree with above.   Deborra Medina, MD

## 2017-09-24 NOTE — Patient Instructions (Addendum)
  Brenda Cardenas , Thank you for taking time to come for your Medicare Wellness Visit. I appreciate your ongoing commitment to your health goals. Please review the following plan we discussed and let me know if I can assist you in the future.   Follow up with Dr. Derrel Nip as needed.    Have a great day and Happy New Year!  These are the goals we discussed: Goals    . Increase physical activity     Walk for exercise 5 days weekly, 30 minutes as tolerated       This is a list of the screening recommended for you and due dates:  Health Maintenance  Topic Date Due  . Tetanus Vaccine  09/28/2018  . Flu Shot  Completed  . DEXA scan (bone density measurement)  Completed  . Pneumonia vaccines  Completed

## 2017-10-07 ENCOUNTER — Encounter: Payer: Self-pay | Admitting: Podiatry

## 2017-10-07 ENCOUNTER — Ambulatory Visit (INDEPENDENT_AMBULATORY_CARE_PROVIDER_SITE_OTHER): Payer: Medicare Other | Admitting: Podiatry

## 2017-10-07 DIAGNOSIS — M722 Plantar fascial fibromatosis: Secondary | ICD-10-CM

## 2017-10-07 DIAGNOSIS — M25572 Pain in left ankle and joints of left foot: Secondary | ICD-10-CM

## 2017-10-07 DIAGNOSIS — M779 Enthesopathy, unspecified: Secondary | ICD-10-CM

## 2017-10-07 NOTE — Progress Notes (Signed)
Subjective: Brenda Cardenas presents to the office today requesting steroid injections into her left foot.  She states that she has done well from injections over the last month she started to have recurrence of pain.  She denies any recent injury or trauma she denies any swelling or redness.  She states the ankle brace does help quite a bit she is requesting a new ankle brace as well.  She has no other concerns today. Denies any systemic complaints such as fevers, chills, nausea, vomiting. No acute changes since last appointment, and no other complaints at this time.   Objective: AAO x3, NAD DP/PT pulses palpable bilaterally, CRT less than 3 seconds There is tenderness along the left foot on the lateral aspect along the sinus tarsi there is decreased range of motion of the subtalar joint and pain with subtalar joint range of motion.  There is tenderness the plantar medial tubercle of the calcaneus at the insertion of plantar fascia.  Plantar fascia appears to be intact.  There is no other area of tenderness identified at this time.  Ankle joint range of motion intact without any restrictions.  There is no pain with lateral compression of the calcaneus.  No overlying edema, erythema, increase in warmth. Significant flatfoot is present as well as hammertoe deformities.  Small pre-ulcerative areas to the distal aspect of the digits although minimal hyperkeratotic tissue.  She did trim the callus on her left second toe this is abrasion type lesion present.  There is no drainage or pus or any signs of infection present. No open lesions or pre-ulcerative lesions.  No pain with calf compression, swelling, warmth, erythema  Assessment: Capsulitis subtalar joint left side with plantar fasciitis  Plan: -All treatment options discussed with the patient including all alternatives, risks, complications.  -She is requesting steroid injections today.  See procedure notes below.  I also dispensed a new Tri-Lock ankle  brace.  We discussed surgical intervention but we both agreed to hold off on this. -She gets pedicures and she is scheduled to get one in the next couple weeks.  She declined me to trim the calluses.  Procedure: Injection Tendon/Ligament Discussed alternatives, risks, complications and verbal consent was obtained.  Location: Left plantar fascia at the glabrous junction; medial approach. Skin Prep: Alcohol. Injectate: 1 cc 0.5% marcaine plain, 1 cc 0.5% Marcaine plain and, 1 cc kenalog 10. Disposition: Patient tolerated procedure well. Injection site dressed with a band-aid.  Post-injection care was discussed and return precautions discussed.    Procedure: Injection intermediate joint left sinus tarsi  Discussed alternatives, risks, complications and verbal consent was obtained.  Location: Left sinus tarsi Skin Prep: Betadine. Injectate: 1 cc 0.5% marcaine plain, 1 cc 0.5% Marcaine plain and, 1 cc kenalog 10. Disposition: Patient tolerated procedure well. Injection site dressed with a band-aid.  Post-injection care was discussed and return precautions discussed.   -Patient encouraged to call the office with any questions, concerns, change in symptoms.   Trula Slade DPM

## 2017-10-19 ENCOUNTER — Telehealth: Payer: Self-pay

## 2017-10-19 ENCOUNTER — Encounter: Payer: Self-pay | Admitting: Internal Medicine

## 2017-10-19 NOTE — Telephone Encounter (Signed)
Tried to call patient but phone line is currently busy.

## 2017-10-19 NOTE — Telephone Encounter (Signed)
There are no openings in Dr. Demetrios Isaacs schedule.

## 2017-10-19 NOTE — Telephone Encounter (Signed)
Attempted to call pt again and number is still busy.

## 2017-10-19 NOTE — Telephone Encounter (Signed)
Copied from [place] [phone]. Topic: Appointment Scheduling - Scheduling Inquiry for Clinic >> Oct 19, 2017 12:37 PM Brenda Cardenas, NT wrote: Patient states she fell on Saturday while she was out of town and has stitches in her head and a hematoma on her left hip. She would like for Dr. Derrel Nip to see her today as she is leaving to go back out of town tomorrow 10/20/17. Please contact patient if this is at all possible, I did not see any openings.

## 2017-10-20 NOTE — Telephone Encounter (Signed)
Pt was triaged in the office this morning by Juliann Pulse. See triage encounter.

## 2017-10-25 ENCOUNTER — Other Ambulatory Visit: Payer: Self-pay | Admitting: Anesthesiology

## 2017-10-25 ENCOUNTER — Encounter: Payer: Self-pay | Admitting: Anesthesiology

## 2017-10-25 ENCOUNTER — Other Ambulatory Visit: Payer: Self-pay

## 2017-10-25 ENCOUNTER — Ambulatory Visit (HOSPITAL_BASED_OUTPATIENT_CLINIC_OR_DEPARTMENT_OTHER): Payer: Medicare Other | Admitting: Anesthesiology

## 2017-10-25 ENCOUNTER — Ambulatory Visit
Admission: RE | Admit: 2017-10-25 | Discharge: 2017-10-25 | Disposition: A | Discharge status: Home / Self Care | Payer: Medicare Other | Source: Ambulatory Visit | Attending: Anesthesiology | Admitting: Anesthesiology

## 2017-10-25 VITALS — BP 160/79 | HR 63 | Temp 97.7°F | Resp 16 | Ht 66.0 in | Wt 159.0 lb

## 2017-10-25 DIAGNOSIS — M48061 Spinal stenosis, lumbar region without neurogenic claudication: Secondary | ICD-10-CM | POA: Diagnosis present

## 2017-10-25 DIAGNOSIS — M545 Low back pain, unspecified: Secondary | ICD-10-CM

## 2017-10-25 DIAGNOSIS — R52 Pain, unspecified: Secondary | ICD-10-CM

## 2017-10-25 DIAGNOSIS — M5441 Lumbago with sciatica, right side: Secondary | ICD-10-CM | POA: Diagnosis not present

## 2017-10-25 DIAGNOSIS — G8929 Other chronic pain: Secondary | ICD-10-CM

## 2017-10-25 DIAGNOSIS — M5136 Other intervertebral disc degeneration, lumbar region: Secondary | ICD-10-CM

## 2017-10-25 DIAGNOSIS — M4125 Other idiopathic scoliosis, thoracolumbar region: Secondary | ICD-10-CM

## 2017-10-25 MED ORDER — SODIUM CHLORIDE 0.9% FLUSH
10.0000 mL | Freq: Once | INTRAVENOUS | Status: AC
  Administered 2017-10-25: 10 mL

## 2017-10-25 MED ORDER — LIDOCAINE HCL (PF) 1 % IJ SOLN
INTRAMUSCULAR | Status: AC
  Filled 2017-10-25: qty 5

## 2017-10-25 MED ORDER — TRIAMCINOLONE ACETONIDE 40 MG/ML IJ SUSP
40.0000 mg | Freq: Once | INTRAMUSCULAR | Status: AC
  Administered 2017-10-25: 40 mg

## 2017-10-25 MED ORDER — SODIUM CHLORIDE 0.9 % IJ SOLN
INTRAMUSCULAR | Status: AC
  Filled 2017-10-25: qty 10

## 2017-10-25 MED ORDER — TRIAMCINOLONE ACETONIDE 40 MG/ML IJ SUSP
INTRAMUSCULAR | Status: AC
  Filled 2017-10-25: qty 1

## 2017-10-25 MED ORDER — ROPIVACAINE HCL 2 MG/ML IJ SOLN
10.0000 mL | Freq: Once | INTRAMUSCULAR | Status: AC
  Administered 2017-10-25: 10 mL via EPIDURAL

## 2017-10-25 MED ORDER — IOPAMIDOL (ISOVUE-M 200) INJECTION 41%
INTRAMUSCULAR | Status: AC
  Filled 2017-10-25: qty 10

## 2017-10-25 MED ORDER — LIDOCAINE HCL (PF) 1 % IJ SOLN
5.0000 mL | Freq: Once | INTRAMUSCULAR | Status: AC
  Administered 2017-10-25: 5 mL via SUBCUTANEOUS

## 2017-10-25 MED ORDER — DEXAMETHASONE SODIUM PHOSPHATE 10 MG/ML IJ SOLN
INTRAMUSCULAR | Status: AC
  Filled 2017-10-25: qty 1

## 2017-10-25 MED ORDER — IOPAMIDOL (ISOVUE-M 200) INJECTION 41%
20.0000 mL | Freq: Once | INTRAMUSCULAR | Status: DC | PRN
  Administered 2017-10-25: 10 mL
  Filled 2017-10-25: qty 20

## 2017-10-25 MED ORDER — ROPIVACAINE HCL 2 MG/ML IJ SOLN
INTRAMUSCULAR | Status: AC
  Filled 2017-10-25: qty 10

## 2017-10-25 NOTE — Progress Notes (Signed)
Safety precautions to be maintained throughout the outpatient stay will include: orient to surroundings, keep bed in low position, maintain call bell within reach at all times, provide assistance with transfer out of bed and ambulation.  

## 2017-10-25 NOTE — Progress Notes (Signed)
Subjective:  Patient ID: Brenda Cardenas, female    DOB: 04-02-1932  Age: 84 y.o. MRN: [id number]  CC: Back Pain (right, lower)   Procedure: L3-4 lumbar epidural steroid under fluoroscopic guidance without sedation  HPI Brenda Cardenas presents for evaluation.  She was last seen a few months ago and unfortunately had a fairly severe fall.  This caused significant bruising to the left lateral hip region with ecchymosis and she was seen in the ER and had some's sutures placed over a left supraorbital I wound.  She reports that she did very well with her last epidural in the clinic they gave her 50-75% improvement in her low back pain and this lasted several weeks.  She desires to proceed with a repeat epidural injection.  Otherwise she is been in her usual state of health with no new changes in lower extremity strength or function and quality characteristic and distribution of her pain is been stable as well.  Her bowel bladder function is also been stable.  She only takes occasional Tylenol and no other anti-inflammatory medications.  She is on no blood thinners.  Outpatient Medications Prior to Visit  Medication Sig Dispense Refill  . acetaminophen (TYLENOL) 325 MG tablet Take 650 mg by mouth 2 (two) times daily. May take up to four  Per day/ 2 in the morning and 2 in the afternoon    . atenolol (TENORMIN) 25 MG tablet TAKE 1 TABLET BY MOUTH DAILY 90 tablet 1  . calcium citrate-vitamin D (CITRACAL+D) 315-200 MG-UNIT per tablet Take 2 tablets by mouth daily.    . Cholecalciferol (VITAMIN D3) 1000 units CAPS Take 1 capsule by mouth daily.    Marland Kitchen CRANBERRY PO Take 1 tablet by mouth.    . ezetimibe (ZETIA) 10 MG tablet TAKE 1 TABLET BY MOUTH DAILY 30 tablet 3  . Multiple Vitamins-Minerals (CENTRUM SILVER 50+WOMEN PO) Centrum Silver    . omeprazole (PRILOSEC) 20 MG capsule TAKE ONE CAPSULE BY MOUTH TWICE DAILY 30-60 MINUTES BEFORE MEALS 60 capsule 3  . Probiotic Product (ALIGN PO) Take by mouth daily.     . raloxifene (EVISTA) 60 MG tablet raloxifene 60 mg tablet     Facility-Administered Medications Prior to Visit  Medication Dose Route Frequency Provider Last Rate Last Dose  . denosumab (PROLIA) injection 60 mg  60 mg Subcutaneous Once Einar Pheasant, MD        Review of Systems CNS: No confusion or sedation Cardiac: No angina or palpitations GI: No abdominal pain or constipation No fevers chills nausea or vomiting  Objective:  BP (!) 160/79   Pulse 63   Temp 97.7 F (36.5 C) (Oral)   Resp 16   Ht 5\' 6"  (1.676 m)   Wt 159 lb (72.1 kg)   SpO2 100%   BMI 25.66 kg/m    BP Readings from Last 3 Encounters:  10/25/17 (!) 160/79  09/24/17 118/64  07/21/17 (!) 155/88     Wt Readings from Last 3 Encounters:  10/25/17 159 lb (72.1 kg)  09/24/17 160 lb (72.6 kg)  07/21/17 164 lb (74.4 kg)     Physical Exam Pt is alert and oriented PERRL EOMI HEART IS RRR no murmur or rub LCTA no wheezing or rales MUSCULOSKELETAL reveals some ecchymosis about the left hip.  Low back reveals no cutaneous changes.  She has a well-healed suture line over the left orbit her muscle tone and bulk to the lower extremities appears intact without change  Labs  Lab Results  Component Value Date   HGBA1C 5.5 06/16/2017   Lab Results  Component Value Date   MICROALBUR 1.6 06/16/2017   LDLCALC 95 06/16/2017   CREATININE 0.84 06/16/2017    -------------------------------------------------------------------------------------------------------------------- Lab Results  Component Value Date   WBC 7.8 08/13/2016   HGB 12.9 08/13/2016   HCT 39.0 08/13/2016   PLT 186.0 08/13/2016   GLUCOSE 104 (H) 06/16/2017   CHOL 186 06/16/2017   TRIG 129.0 06/16/2017   HDL 65.80 06/16/2017   LDLDIRECT 103.0 06/16/2017   LDLCALC 95 06/16/2017   ALT 19 06/16/2017   AST 20 06/16/2017   NA 139 06/16/2017   K 4.1 06/16/2017   CL 105 06/16/2017   CREATININE 0.84 06/16/2017   BUN 23 06/16/2017   CO2 25  06/16/2017   TSH 0.78 12/14/2016   HGBA1C 5.5 06/16/2017   MICROALBUR 1.6 06/16/2017    --------------------------------------------------------------------------------------------------------------------- Dg C-arm 1-60 Min-no Report  Result Date: 10/25/2017 Fluoroscopy was utilized by the requesting physician.  No radiographic interpretation.     Assessment & Plan:   Brenda Cardenas was seen today for back pain.  Diagnoses and all orders for this visit:  Acute bilateral low back pain with right-sided sciatica  Spinal stenosis of lumbar region without neurogenic claudication -     Lumbar Epidural Injection; Future  Other idiopathic scoliosis, thoracolumbar region  Chronic right-sided low back pain without sciatica  DDD (degenerative disc disease), lumbar  Low back pain at multiple sites  Other orders -     triamcinolone acetonide (KENALOG-40) injection 40 mg -     sodium chloride flush (NS) 0.9 % injection 10 mL -     ropivacaine (PF) 2 mg/mL (0.2%) (NAROPIN) injection 10 mL -     lidocaine (PF) (XYLOCAINE) 1 % injection 5 mL -     iopamidol (ISOVUE-M) 41 % intrathecal injection 20 mL        ----------------------------------------------------------------------------------------------------------------------  Problem List Items Addressed This Visit      Unprioritized   DDD (degenerative disc disease), lumbar   Relevant Medications   triamcinolone acetonide (KENALOG-40) injection 40 mg (Completed)   Idiopathic scoliosis   Right-sided low back pain without sciatica   Relevant Medications   triamcinolone acetonide (KENALOG-40) injection 40 mg (Completed)   Spinal stenosis of lumbar region   Relevant Orders   Lumbar Epidural Injection    Other Visit Diagnoses    Acute bilateral low back pain with right-sided sciatica    -  Primary   Relevant Medications   triamcinolone acetonide (KENALOG-40) injection 40 mg (Completed)   Low back pain at multiple sites        Relevant Medications   triamcinolone acetonide (KENALOG-40) injection 40 mg (Completed)        ----------------------------------------------------------------------------------------------------------------------  1. Acute bilateral low back pain with right-sided sciatica We will proceed with a repeat epidural injection as she has shown favorable progress with these.  The risks benefits have been reviewed with her in full detail all questions answered.  We will have her return to clinic in 3 months for reevaluation possible repeat epidural injection at that time.  2. Spinal stenosis of lumbar region without neurogenic claudication As above - Lumbar Epidural Injection; Future  3. Other idiopathic scoliosis, thoracolumbar region As above  4. Chronic right-sided low back pain without sciatica As above  5. DDD (degenerative disc disease), lumbar   6. Low back pain at multiple sites     ----------------------------------------------------------------------------------------------------------------------  I am having Brenda Cardenas maintain her calcium citrate-vitamin D,  acetaminophen, Probiotic Product (ALIGN PO), Vitamin D3, raloxifene, Multiple Vitamins-Minerals (CENTRUM SILVER 50+WOMEN PO), ezetimibe, atenolol, omeprazole, and CRANBERRY PO. We administered triamcinolone acetonide, sodium chloride flush, ropivacaine (PF) 2 mg/mL (0.2%), lidocaine (PF), and iopamidol. We will continue to administer denosumab.   Meds ordered this encounter  Medications  . triamcinolone acetonide (KENALOG-40) injection 40 mg  . sodium chloride flush (NS) 0.9 % injection 10 mL  . ropivacaine (PF) 2 mg/mL (0.2%) (NAROPIN) injection 10 mL  . lidocaine (PF) (XYLOCAINE) 1 % injection 5 mL  . iopamidol (ISOVUE-M) 41 % intrathecal injection 20 mL   Patient's Medications  New Prescriptions   No medications on file  Previous Medications   ACETAMINOPHEN (TYLENOL) 325 MG TABLET    Take 650 mg by mouth 2  (two) times daily. May take up to four  Per day/ 2 in the morning and 2 in the afternoon   ATENOLOL (TENORMIN) 25 MG TABLET    TAKE 1 TABLET BY MOUTH DAILY   CALCIUM CITRATE-VITAMIN D (CITRACAL+D) 315-200 MG-UNIT PER TABLET    Take 2 tablets by mouth daily.   CHOLECALCIFEROL (VITAMIN D3) 1000 UNITS CAPS    Take 1 capsule by mouth daily.   CRANBERRY PO    Take 1 tablet by mouth.   EZETIMIBE (ZETIA) 10 MG TABLET    TAKE 1 TABLET BY MOUTH DAILY   MULTIPLE VITAMINS-MINERALS (CENTRUM SILVER 50+WOMEN PO)    Centrum Silver   OMEPRAZOLE (PRILOSEC) 20 MG CAPSULE    TAKE ONE CAPSULE BY MOUTH TWICE DAILY 30-60 MINUTES BEFORE MEALS   PROBIOTIC PRODUCT (ALIGN PO)    Take by mouth daily.   RALOXIFENE (EVISTA) 60 MG TABLET    raloxifene 60 mg tablet  Modified Medications   No medications on file  Discontinued Medications   No medications on file   ----------------------------------------------------------------------------------------------------------------------  Follow-up: Return in about 3 months (around 01/23/2018) for evaluation, procedure.  Procedure: L3-4 epidural steroid under fluoroscopic guidance without sedation   Procedure: 3 4 LESI with fluoroscopic guidance and moderate sedation  NOTE: The risks, benefits, and expectations of the procedure have been discussed and explained to the patient who was understanding and in agreement with suggested treatment plan. No guarantees were made.  DESCRIPTION OF PROCEDURE: Lumbar epidural steroid injection with no IV Versed, EKG, blood pressure, pulse, and pulse oximetry monitoring. The procedure was performed with the patient in the prone position under fluoroscopic guidance.  Sterile prep x3 was initiated and I then injected subcutaneous lidocaine to the overlying 3 4 site after its fluoroscopic identifictation.  Using strict aseptic technique, I then advanced an 18-gauge Tuohy epidural needle in the midline using interlaminar approach via  loss-of-resistance to saline technique. There was negative aspiration for heme or  CSF.  I then confirmed position with both AP and Lateral fluoroscan.  2 cc of Isovue were injected and a  total of 5 mL of Preservative-Free normal saline mixed with 10 mg Decadron and 0.5 cc Ropicaine 0.2 percent were injected incrementally via the  epidurally placed needle. The needle was removed. The patient tolerated the injection well and was convalesced and discharged to home in stable condition. Should the patient have any post procedure difficulty they have been instructed on how to contact us for assistance.    Molli Barrows, MD

## 2017-10-25 NOTE — Patient Instructions (Signed)
Pain Management Discharge Instructions  General Discharge Instructions :  If you need to reach your doctor call: Monday-Friday 8:00 am - 4:00 pm at [phone] or toll free [phone].  After clinic hours [phone] to have operator reach doctor.  Bring all of your medication bottles to all your appointments in the pain clinic.  To cancel or reschedule your appointment with Pain Management please remember to call 24 hours in advance to avoid a fee.  Refer to the educational materials which you have been given on: General Risks, I had my Procedure. Discharge Instructions, Post Sedation.  Post Procedure Instructions:  Please notify your doctor immediately if you have any unusual bleeding, trouble breathing or pain that is not related to your normal pain.  Depending on the type of procedure that was done, some parts of your body may feel week and/or numb.  This usually clears up by tonight or the next day.  Walk with the use of an assistive device or accompanied by an adult for the 24 hours.  You may use ice on the affected area for the first 24 hours.  Put ice in a Ziploc bag and cover with a towel and place against area 15 minutes on 15 minutes off.  You may switch to heat after 24 hours. 

## 2017-10-26 ENCOUNTER — Telehealth: Payer: Self-pay | Admitting: *Deleted

## 2017-10-26 NOTE — Telephone Encounter (Signed)
Denies complications post procedure. 

## 2017-10-27 ENCOUNTER — Other Ambulatory Visit (INDEPENDENT_AMBULATORY_CARE_PROVIDER_SITE_OTHER): Payer: Medicare Other

## 2017-10-27 ENCOUNTER — Telehealth: Payer: Self-pay | Admitting: Internal Medicine

## 2017-10-27 DIAGNOSIS — R300 Dysuria: Secondary | ICD-10-CM

## 2017-10-27 LAB — POCT URINALYSIS DIPSTICK
Bilirubin, UA: NEGATIVE
Glucose, UA: NEGATIVE
Ketones, UA: NEGATIVE
NITRITE UA: NEGATIVE
PROTEIN UA: NEGATIVE
Spec Grav, UA: <=1.005 — AB (ref 1.010–1.025)
Urobilinogen, UA: 0.2 E.U./dL
pH, UA: 6 (ref 5.0–8.0)

## 2017-10-27 MED ORDER — CIPROFLOXACIN HCL 250 MG PO TABS: 250.0000 mg | ORAL_TABLET | Freq: Two times a day (BID) | ORAL | 0 refills | Status: DC

## 2017-10-27 NOTE — Telephone Encounter (Signed)
cipro sent [facility],  Treatment may change Pending culture results

## 2017-10-27 NOTE — Telephone Encounter (Addendum)
Patient started on Monday with dysuria ( burning) with frequency , now cannot hold urine .denies fever or chills . Gave POCT and got micro with culture .

## 2017-10-28 ENCOUNTER — Encounter: Payer: Self-pay | Admitting: Internal Medicine

## 2017-10-28 ENCOUNTER — Other Ambulatory Visit: Payer: Self-pay | Admitting: Internal Medicine

## 2017-10-28 ENCOUNTER — Telehealth: Payer: Self-pay | Admitting: Internal Medicine

## 2017-10-28 ENCOUNTER — Telehealth: Payer: Self-pay | Admitting: Anesthesiology

## 2017-10-28 LAB — URINALYSIS, MICROSCOPIC ONLY: Bacteria, UA: NONE SEEN

## 2017-10-28 NOTE — Telephone Encounter (Signed)
Patient would like to speak with nurse ( she asked for [facility])

## 2017-10-28 NOTE — Telephone Encounter (Signed)
Per message patient would like call back from you

## 2017-10-28 NOTE — Telephone Encounter (Signed)
Left message for patient to return call to office, [place] may give message from PCP.

## 2017-10-28 NOTE — Telephone Encounter (Signed)
Patient returned call and the message was given to her per Dr. Derrel Nip: cipro sent [facility],  Treatment may change Pending culture results Pt wants Juliann Pulse to give her a call back regarding this message.

## 2017-10-28 NOTE — Telephone Encounter (Signed)
Talked with patient advised same verbal given by [facility] RN.

## 2017-10-28 NOTE — Telephone Encounter (Signed)
This should've been sent to you

## 2017-10-28 NOTE — Telephone Encounter (Signed)
Patient was advised she would need to establish with a provider she hung up the phone.

## 2017-10-28 NOTE — Telephone Encounter (Signed)
Patient states she is still hurting around the waist.  Would like to notify Dr Andree Elk that she would like procedure done at a different level.  Will forward to Dr Andree Elk.  Informed patient to also discuss with him at her next visit.  Patinet states understanding.

## 2017-10-29 ENCOUNTER — Encounter: Payer: Self-pay | Admitting: Internal Medicine

## 2017-10-29 ENCOUNTER — Encounter: Payer: Self-pay | Admitting: Emergency Medicine

## 2017-10-29 ENCOUNTER — Other Ambulatory Visit: Payer: Self-pay

## 2017-10-29 ENCOUNTER — Telehealth: Payer: Self-pay

## 2017-10-29 DIAGNOSIS — N189 Chronic kidney disease, unspecified: Secondary | ICD-10-CM | POA: Diagnosis not present

## 2017-10-29 DIAGNOSIS — R109 Unspecified abdominal pain: Secondary | ICD-10-CM | POA: Diagnosis present

## 2017-10-29 DIAGNOSIS — I129 Hypertensive chronic kidney disease with stage 1 through stage 4 chronic kidney disease, or unspecified chronic kidney disease: Secondary | ICD-10-CM | POA: Insufficient documentation

## 2017-10-29 DIAGNOSIS — Z79899 Other long term (current) drug therapy: Secondary | ICD-10-CM | POA: Diagnosis not present

## 2017-10-29 DIAGNOSIS — K529 Noninfective gastroenteritis and colitis, unspecified: Secondary | ICD-10-CM | POA: Diagnosis not present

## 2017-10-29 DIAGNOSIS — Z9049 Acquired absence of other specified parts of digestive tract: Secondary | ICD-10-CM | POA: Insufficient documentation

## 2017-10-29 LAB — CBC
HEMATOCRIT: 40.6 % (ref 35.0–47.0)
HEMOGLOBIN: 13.5 g/dL (ref 12.0–16.0)
MCH: 31.8 pg (ref 26.0–34.0)
MCHC: 33.2 g/dL (ref 32.0–36.0)
MCV: 95.8 fL (ref 80.0–100.0)
Platelets: 250 10*3/uL (ref 150–440)
RBC: 4.23 MIL/uL (ref 3.80–5.20)
RDW: 17.9 % — ABNORMAL HIGH (ref 11.5–14.5)
WBC: 14.2 10*3/uL — AB (ref 3.6–11.0)

## 2017-10-29 LAB — COMPREHENSIVE METABOLIC PANEL
ALT: 21 U/L (ref 14–54)
ANION GAP: 8 (ref 5–15)
AST: 28 U/L (ref 15–41)
Albumin: 4.3 g/dL (ref 3.5–5.0)
Alkaline Phosphatase: 73 U/L (ref 38–126)
BUN: 21 mg/dL — ABNORMAL HIGH (ref 6–20)
CHLORIDE: 105 mmol/L (ref 101–111)
CO2: 25 mmol/L (ref 22–32)
Calcium: 9.8 mg/dL (ref 8.9–10.3)
Creatinine, Ser: 0.9 mg/dL (ref 0.44–1.00)
GFR, EST NON AFRICAN AMERICAN: 58 mL/min — AB (ref >60)
Glucose, Bld: 114 mg/dL — ABNORMAL HIGH (ref 65–99)
POTASSIUM: 3.7 mmol/L (ref 3.5–5.1)
Sodium: 138 mmol/L (ref 135–145)
TOTAL PROTEIN: 7.4 g/dL (ref 6.5–8.1)
Total Bilirubin: 1 mg/dL (ref 0.3–1.2)

## 2017-10-29 LAB — URINALYSIS, COMPLETE (UACMP) WITH MICROSCOPIC
BILIRUBIN URINE: NEGATIVE
Glucose, UA: NEGATIVE mg/dL
Ketones, ur: NEGATIVE mg/dL
Leukocytes, UA: NEGATIVE
NITRITE: NEGATIVE
PH: 5 (ref 5.0–8.0)
Protein, ur: NEGATIVE mg/dL
SPECIFIC GRAVITY, URINE: 1.019 (ref 1.005–1.030)

## 2017-10-29 LAB — TYPE AND SCREEN
ABO/RH(D): O POS
ANTIBODY SCREEN: NEGATIVE

## 2017-10-29 LAB — URINE CULTURE
MICRO NUMBER:: 90128131
SPECIMEN QUALITY: ADEQUATE

## 2017-10-29 LAB — TROPONIN I

## 2017-10-29 LAB — LIPASE, BLOOD: LIPASE: 27 U/L (ref 11–51)

## 2017-10-29 NOTE — Telephone Encounter (Signed)
Spoke with patient and advised her of below .   Patient states she has history of external hemorrhoids.   She states this pm stool was formed feces and mushy , bright  along with blood.   Abdominal pain has subsided.  She will stop antibiotic and if diarrhea persist will go to ER as directed.

## 2017-10-29 NOTE — ED Notes (Signed)
Patient to stat desk via wheelchair from [facility] for GI bleeding since 3 pm.  [facility] staff reports patient diagnosed with UTI 3 days ago and was taking Cipro.

## 2017-10-29 NOTE — Telephone Encounter (Signed)
When went to go to bathroom this afternoon it was bright red blood and feces is semi formed  When coming from  and urinating.  No fever, Abdomen is sore, not severe .  No fever. Still on Cipro and priobiotics

## 2017-10-29 NOTE — Telephone Encounter (Signed)
Tell her to stop the cipro .  The bleeding is probably from her internal hemorrhoid being aggravated by the diarrhea.  She can take one dose of immodium to stop the diarrhea ., and eat yogurt or take a probiotic daily  if not already taking.    If the diarrhea and bleeding continues over the weekend,  She will need to go to the ER because it may be something more  serious

## 2017-10-29 NOTE — ED Triage Notes (Signed)
Pt reports that she was given ciprofloxacin for a UTI by her PCP and was positive for a UTI. Pt also reports that she was having to strain to have a BM and her BM have progressed to to loose with bright red blood today. Pt states that she has "mustardy" stools at this time that are very loose. Pt does reports hx of external hemorrhoids. Pt is in NAD at this time.

## 2017-10-30 ENCOUNTER — Emergency Department: Payer: Medicare Other

## 2017-10-30 ENCOUNTER — Emergency Department
Admission: EM | Admit: 2017-10-30 | Discharge: 2017-10-30 | Disposition: A | Discharge status: Home / Self Care | Payer: Medicare Other | Attending: Emergency Medicine | Admitting: Emergency Medicine

## 2017-10-30 ENCOUNTER — Encounter: Payer: Self-pay | Admitting: Internal Medicine

## 2017-10-30 DIAGNOSIS — R197 Diarrhea, unspecified: Secondary | ICD-10-CM

## 2017-10-30 DIAGNOSIS — K529 Noninfective gastroenteritis and colitis, unspecified: Secondary | ICD-10-CM | POA: Diagnosis not present

## 2017-10-30 DIAGNOSIS — R1031 Right lower quadrant pain: Secondary | ICD-10-CM

## 2017-10-30 LAB — CBC
HCT: 36.3 % (ref 35.0–47.0)
HEMOGLOBIN: 12.1 g/dL (ref 12.0–16.0)
MCH: 31.7 pg (ref 26.0–34.0)
MCHC: 33.4 g/dL (ref 32.0–36.0)
MCV: 94.8 fL (ref 80.0–100.0)
Platelets: 245 10*3/uL (ref 150–440)
RBC: 3.83 MIL/uL (ref 3.80–5.20)
RDW: 18.4 % — ABNORMAL HIGH (ref 11.5–14.5)
WBC: 12.9 10*3/uL — ABNORMAL HIGH (ref 3.6–11.0)

## 2017-10-30 LAB — C DIFFICILE QUICK SCREEN W PCR REFLEX
C Diff antigen: NEGATIVE
C Diff interpretation: NOT DETECTED
C Diff toxin: NEGATIVE

## 2017-10-30 LAB — GASTROINTESTINAL PANEL BY PCR, STOOL (REPLACES STOOL CULTURE)
Adenovirus F40/41: NOT DETECTED
Astrovirus: NOT DETECTED
CYCLOSPORA CAYETANENSIS: NOT DETECTED
Campylobacter species: NOT DETECTED
Cryptosporidium: NOT DETECTED
Entamoeba histolytica: NOT DETECTED
Enteroaggregative E coli (EAEC): NOT DETECTED
Enteropathogenic E coli (EPEC): NOT DETECTED
Enterotoxigenic E coli (ETEC): NOT DETECTED
Giardia lamblia: NOT DETECTED
Norovirus GI/GII: NOT DETECTED
Plesimonas shigelloides: NOT DETECTED
Rotavirus A: NOT DETECTED
SALMONELLA SPECIES: NOT DETECTED
SAPOVIRUS (I, II, IV, AND V): NOT DETECTED
SHIGA LIKE TOXIN PRODUCING E COLI (STEC): NOT DETECTED
SHIGELLA/ENTEROINVASIVE E COLI (EIEC): NOT DETECTED
VIBRIO SPECIES: NOT DETECTED
Vibrio cholerae: NOT DETECTED
Yersinia enterocolitica: NOT DETECTED

## 2017-10-30 MED ORDER — IOPAMIDOL (ISOVUE-300) INJECTION 61%
30.0000 mL | Freq: Once | INTRAVENOUS | Status: AC | PRN
  Administered 2017-10-30: 30 mL via ORAL

## 2017-10-30 MED ORDER — DICYCLOMINE HCL 20 MG PO TABS: 20.0000 mg | ORAL_TABLET | Freq: Four times a day (QID) | ORAL | 0 refills | Status: DC | PRN

## 2017-10-30 MED ORDER — IOPAMIDOL (ISOVUE-300) INJECTION 61%
100.0000 mL | Freq: Once | INTRAVENOUS | Status: AC | PRN
  Administered 2017-10-30: 100 mL via INTRAVENOUS

## 2017-10-30 MED ORDER — DICYCLOMINE HCL 20 MG PO TABS
20.0000 mg | ORAL_TABLET | Freq: Once | ORAL | Status: AC
  Administered 2017-10-30: 20 mg via ORAL
  Filled 2017-10-30: qty 1

## 2017-10-30 NOTE — ED Provider Notes (Signed)
[facility] Emergency Department Provider Note   ____________________________________________   First MD Initiated Contact with Patient 10/30/17 0122     (approximate)  I have reviewed the triage vital signs and the nursing notes.   HISTORY  Chief Complaint Abdominal Pain and Rectal Bleeding    HPI Brenda Cardenas is a 84 y.o. female sent to the ED from [facility] clinic with a chief complaint of abdominal pain, diarrhea and bloody stools.  Patient was placed on Cipro for a UTI by her PCP.  She had a 3-day course and began to have right-sided abdominal pain, diarrhea and had one episode of bright red blood per rectum.  States her stools are more mustardy at this time.  Denies fever, chills, chest pain, shortness of breath, nausea, vomiting, lightheadedness or dizziness.  Denies recent travel or trauma.  Denies use of anticoagulants.   Past Medical History:  Diagnosis Date  . Arthritis   . Chronic kidney disease   . Colon polyps   . GERD (gastroesophageal reflux disease)   . Hyperlipidemia   . Hypertension     Patient Active Problem List   Diagnosis Date Noted  . Recurrent UTI (urinary tract infection) 06/26/2017  . Pain in limb 02/16/2017  . Constipation 07/14/2016  . Hypovitaminosis D 06/23/2016  . Venous stasis of both lower extremities 06/23/2016  . Essential hypertension, benign 12/24/2015  . History of colonic polyps 12/24/2015  . Nocturnal leg cramps 12/24/2015  . DDD (degenerative disc disease), lumbar 08/13/2015  . Right-sided low back pain without sciatica 08/13/2015  . Spinal stenosis of lumbar region 08/13/2015  . Idiopathic scoliosis 06/24/2015  . Rhinitis due food 12/17/2014  . Osteoarthritis of cervical spine 02/25/2014  . Osteoporosis 06/18/2013  . Encounter for Medicare annual wellness exam 06/18/2013  . Hiatal hernia 06/16/2013  . GERD (gastroesophageal reflux disease) 05/02/2013  . S/P total knee replacement 05/02/2013  .  Overweight 05/02/2013  . Recurrent cystitis 05/02/2013    Past Surgical History:  Procedure Laterality Date  . APPENDECTOMY  1959  . CHOLECYSTECTOMY  1995  . JOINT REPLACEMENT Right July 2009   [facility]   . TONSILLECTOMY AND ADENOIDECTOMY      Prior to Admission medications   Medication Sig Start Date End Date Taking? Authorizing Provider  acetaminophen (TYLENOL) 325 MG tablet Take 650 mg by mouth 2 (two) times daily. May take up to four  Per day/ 2 in the morning and 2 in the afternoon   Yes [provider]  atenolol (TENORMIN) 25 MG tablet TAKE 1 TABLET BY MOUTH DAILY 09/02/17  Yes Crecencio Mc, MD  calcium citrate-vitamin D (CITRACAL+D) 315-200 MG-UNIT per tablet Take 2 tablets by mouth daily.   Yes [provider]  Cholecalciferol (VITAMIN D3) 1000 units CAPS Take 1 capsule by mouth daily.   Yes [provider]  ciprofloxacin (CIPRO) 250 MG tablet Take 1 tablet (250 mg total) by mouth 2 (two) times daily. 10/27/17  Yes Crecencio Mc, MD  CRANBERRY PO Take 1 capsule by mouth daily.    Yes [provider]  ezetimibe (ZETIA) 10 MG tablet TAKE 1 TABLET DAILY 10/28/17  Yes Crecencio Mc, MD  Multiple Vitamins-Minerals (CENTRUM SILVER 50+WOMEN PO) 1 tablet by mouth every day   Yes [provider]  omeprazole (PRILOSEC) 20 MG capsule TAKE ONE CAPSULE BY MOUTH TWICE DAILY 30-60 MINUTES BEFORE MEALS 09/08/17  Yes Crecencio Mc, MD  Probiotic Product (ALIGN PO) Take by mouth daily.  Yes [provider]  dicyclomine (BENTYL) 20 MG tablet Take 1 tablet (20 mg total) by mouth every 6 (six) hours as needed. 10/30/17   Paulette Blanch, MD  raloxifene (EVISTA) 60 MG tablet raloxifene 60 mg tablet    [provider]    Allergies Clindamycin/lincomycin; Sulfa antibiotics; Augmentin [amoxicillin-pot clavulanate]; Naproxen sodium; Tramadol; Acetaminophen; Codeine; Doxycycline; Doxycycline hyclate; E-mycin [erythromycin]; Lipitor  [atorvastatin]; Macrobid [nitrofurantoin]; Nitrofurantoin monohyd macro; Nsaids; Phenobarbital; Relafen [nabumetone]; Statins; Sulfacetamide sodium; Sulfasalazine; and Tramadol hcl  Family History  Problem Relation Age of Onset  . Heart disease Mother   . Arthritis Mother   . Diabetes Father     Social History Social History   Tobacco Use  . Smoking status: Never Smoker  . Smokeless tobacco: Never Used  Substance Use Topics  . Alcohol use: No    Alcohol/week: 0.0 oz  . Drug use: No    Review of Systems  Constitutional: No fever/chills. Eyes: No visual changes. ENT: No sore throat. Cardiovascular: Denies chest pain. Respiratory: Denies shortness of breath. Gastrointestinal: Positive for abdominal pain.  No nausea, no vomiting.  Positive for diarrhea.  No constipation.  Positive for bloody stools. Genitourinary: Negative for dysuria. Musculoskeletal: Negative for back pain. Skin: Negative for rash. Neurological: Negative for headaches, focal weakness or numbness.   ____________________________________________   PHYSICAL EXAM:  VITAL SIGNS: ED Triage Vitals  Enc Vitals Group     BP 10/29/17 1924 (!) 178/84     Pulse Rate 10/29/17 1924 88     Resp 10/29/17 1924 16     Temp 10/29/17 1924 98.1 F (36.7 C)     Temp Source 10/29/17 1924 Oral     SpO2 10/29/17 1924 100 %     Weight --      Height --      Head Circumference --      Peak Flow --      Pain Score 10/29/17 1942 4     Pain Loc --      Pain Edu? --      Excl. in [place]? --     Constitutional: Alert and oriented. Well appearing and in no acute distress.  Pleasant and very talkative. Eyes: Conjunctivae are normal. PERRL. EOMI. Head: Atraumatic. Nose: No congestion/rhinnorhea. Mouth/Throat: Mucous membranes are moist.  Oropharynx non-erythematous. Neck: No stridor.  No carotid bruits. Cardiovascular: Normal rate, regular rhythm. Grossly normal heart sounds.  Good peripheral circulation. Respiratory: Normal  respiratory effort.  No retractions. Lungs CTAB. Gastrointestinal: Soft and mildly tender to palpation right lower quadrant without rebound or guarding. No distention. No abdominal bruits. No CVA tenderness. Musculoskeletal: No lower extremity tenderness nor edema.  No joint effusions. Neurologic:  Normal speech and language. No gross focal neurologic deficits are appreciated. No gait instability. Skin:  Skin is warm, dry and intact. No rash noted. Psychiatric: Mood and affect are normal. Speech and behavior are normal.  ____________________________________________   LABS (all labs ordered are listed, but only abnormal results are displayed)  Labs Reviewed  COMPREHENSIVE METABOLIC PANEL - Abnormal; Notable for the following components:      Result Value   Glucose, Bld 114 (*)    BUN 21 (*)    GFR calc non Af Amer 58 (*)    All other components within normal limits  CBC - Abnormal; Notable for the following components:   WBC 14.2 (*)    RDW 17.9 (*)    All other components within normal limits  URINALYSIS, COMPLETE (UACMP)  WITH MICROSCOPIC - Abnormal; Notable for the following components:   Color, Urine YELLOW (*)    APPearance CLOUDY (*)    Hgb urine dipstick SMALL (*)    Bacteria, UA RARE (*)    Squamous Epithelial / LPF 0-5 (*)    All other components within normal limits  CBC - Abnormal; Notable for the following components:   WBC 12.9 (*)    RDW 18.4 (*)    All other components within normal limits  C DIFFICILE QUICK SCREEN W PCR REFLEX  GASTROINTESTINAL PANEL BY PCR, STOOL (REPLACES STOOL CULTURE)  URINE CULTURE  LIPASE, BLOOD  TROPONIN I  POC OCCULT BLOOD, ED  TYPE AND SCREEN   ____________________________________________  EKG  ED ECG REPORT I, SUNG,JADE J, the attending physician, personally viewed and interpreted this ECG.   Date: 10/30/2017  EKG Time: 2006  Rate: 87  Rhythm: normal EKG, normal sinus rhythm  Axis: LAD  Intervals:none  ST&T Change:  Nonspecific  ____________________________________________  RADIOLOGY  ED MD interpretation:  Thickened colon, likely colitis  Official radiology report(s): Ct Abdomen Pelvis W Contrast  Result Date: 10/30/2017 CLINICAL DATA:  Loose stools with bright red blood today. Patient is being treated with ciprofloxacin for urinary tract infection. EXAM: CT ABDOMEN AND PELVIS WITH CONTRAST TECHNIQUE: Multidetector CT imaging of the abdomen and pelvis was performed using the standard protocol following bolus administration of intravenous contrast. CONTRAST:  17mL ISOVUE-300 IOPAMIDOL (ISOVUE-300) INJECTION 61% COMPARISON:  None. FINDINGS: Lower chest: Mild dependent atelectasis in the lung bases. Small esophageal hiatal hernia. Hepatobiliary: No focal liver abnormality is seen. Status post cholecystectomy. No biliary dilatation. Pancreas: Unremarkable. No pancreatic ductal dilatation or surrounding inflammatory changes. Spleen: Normal in size without focal abnormality. Adrenals/Urinary Tract: Adrenal glands are unremarkable. Kidneys are normal, without renal calculi, focal lesion, or hydronephrosis. Bladder wall is mildly thickened suggesting cystitis. Stomach/Bowel: Stomach, small bowel, and colon are not abnormally distended. Despite decompression, the colonic wall appears to be diffusely thickened. This could represent colitis, either infectious, pseudomembranous, or inflammatory. Appendix is surgically absent. Vascular/Lymphatic: Aortic atherosclerosis. No enlarged abdominal or pelvic lymph nodes. Reproductive: Uterus and bilateral adnexa are unremarkable. Other: No abdominal wall hernia or abnormality. No abdominopelvic ascites. Musculoskeletal: Degenerative changes throughout the lumbar spine. Mild anterior subluxation of L4 on L5 is likely degenerative. Lumbar scoliosis convex towards the left. Degenerative changes in the hips. IMPRESSION: 1. Small esophageal hiatal hernia. 2. Colon wall appears to be  diffusely thickened. This could represent colitis, either infectious, pseudomembranous, or inflammatory. 3. Mild bladder wall thickening suggest cystitis. 4. Aortic atherosclerosis. Electronically Signed   By: Lucienne Capers M.D.   On: 10/30/2017 03:46    ____________________________________________   PROCEDURES  Procedure(s) performed:   Rectal exam: External exam notable for small, nonthrombosed, reducible hemorrhoid.  Tan stool on gloved finger which is heme negative.  Procedures  Critical Care performed: No  ____________________________________________   INITIAL IMPRESSION / ASSESSMENT AND PLAN / ED COURSE  As part of my medical decision making, I reviewed the following data within the [facility] notes reviewed and incorporated, Labs reviewed, EKG interpreted, Old chart reviewed, Radiograph reviewed and Notes from prior ED visits.   84 year old female who presents with abdominal pain, diarrhea and bright red blood per rectum in the setting of recent antibiotic use. Differential diagnosis includes, but is not limited to, ovarian cyst, ovarian torsion, acute appendicitis, diverticulitis, urinary tract infection/pyelonephritis, endometriosis, bowel obstruction, colitis, renal colic, gastroenteritis, hernia, fibroids, etc.  Initial H/H  unremarkable.  Patient is well appearing with mild abdominal tenderness on exam.  Will check stool for C. difficile, repeat H/H, obtain CT abdomen/pelvis to evaluate for intra-abdominal etiology of patient's symptoms.  I did review patient's urine culture from 1/30 which was sensitive to Cipro.  UA this morning shows improvement.  Clinical Course as of Oct 30 508  Sat Oct 30, 2017  0451 Patient resting in no acute distress.  Voices no complaints of pain.  Updated her of stool and CT results. We had a long discussion regarding risks/benefits of placing her on antibiotics for possible colitis.  Given that she has so many adverse  reactions to antibiotics including Augmentin and now Cipro, coupled with the fact that her stools are firming up and she has not had any more bloody stools, we decided to hold off on further antibiotics.  Will add a urine culture.  Will discharge home with Bentyl to use as needed.  Strict return precautions given.  Patient verbalizes understanding and agrees with plan of care.  [JS]    Clinical Course User Index [JS] Paulette Blanch, MD     ____________________________________________   FINAL CLINICAL IMPRESSION(S) / ED DIAGNOSES  Final diagnoses:  Right lower quadrant abdominal pain  Diarrhea, unspecified type  Colitis     ED Discharge Orders        Ordered    dicyclomine (BENTYL) 20 MG tablet  Every 6 hours PRN     10/30/17 0508       Note:  This document was prepared using Dragon voice recognition software and may include unintentional dictation errors.    Paulette Blanch, MD 10/30/17 [phone]

## 2017-10-30 NOTE — ED Notes (Signed)
Pt up to BR

## 2017-10-30 NOTE — Discharge Instructions (Signed)
1.  You may take Bentyl as needed for abdominal discomfort. 2.  Eat a BRAT diet for 1 week, then advance diet as tolerated. 3.  Your doctor may need to place you on an antibiotic if you continue to experience watery stools. 4.  Return to the ER for worsening symptoms, persistent vomiting, difficulty breathing or other concerns.

## 2017-11-01 ENCOUNTER — Other Ambulatory Visit: Payer: Self-pay

## 2017-11-01 ENCOUNTER — Ambulatory Visit: Payer: Self-pay | Admitting: *Deleted

## 2017-11-01 ENCOUNTER — Encounter: Payer: Self-pay | Admitting: Emergency Medicine

## 2017-11-01 ENCOUNTER — Emergency Department
Admission: EM | Admit: 2017-11-01 | Discharge: 2017-11-01 | Disposition: A | Discharge status: Home / Self Care | Payer: Medicare Other | Attending: Emergency Medicine | Admitting: Emergency Medicine

## 2017-11-01 DIAGNOSIS — Z79899 Other long term (current) drug therapy: Secondary | ICD-10-CM | POA: Insufficient documentation

## 2017-11-01 DIAGNOSIS — R197 Diarrhea, unspecified: Secondary | ICD-10-CM | POA: Diagnosis present

## 2017-11-01 DIAGNOSIS — K529 Noninfective gastroenteritis and colitis, unspecified: Secondary | ICD-10-CM | POA: Diagnosis not present

## 2017-11-01 DIAGNOSIS — I129 Hypertensive chronic kidney disease with stage 1 through stage 4 chronic kidney disease, or unspecified chronic kidney disease: Secondary | ICD-10-CM | POA: Insufficient documentation

## 2017-11-01 DIAGNOSIS — N189 Chronic kidney disease, unspecified: Secondary | ICD-10-CM | POA: Diagnosis not present

## 2017-11-01 DIAGNOSIS — Z9049 Acquired absence of other specified parts of digestive tract: Secondary | ICD-10-CM | POA: Diagnosis not present

## 2017-11-01 LAB — CBC
HCT: 37.8 % (ref 35.0–47.0)
HEMOGLOBIN: 12.8 g/dL (ref 12.0–16.0)
MCH: 31.8 pg (ref 26.0–34.0)
MCHC: 33.8 g/dL (ref 32.0–36.0)
MCV: 94.1 fL (ref 80.0–100.0)
Platelets: 242 10*3/uL (ref 150–440)
RBC: 4.02 MIL/uL (ref 3.80–5.20)
RDW: 17.8 % — AB (ref 11.5–14.5)
WBC: 14.6 10*3/uL — ABNORMAL HIGH (ref 3.6–11.0)

## 2017-11-01 LAB — COMPREHENSIVE METABOLIC PANEL
ALBUMIN: 4 g/dL (ref 3.5–5.0)
ALT: 19 U/L (ref 14–54)
ANION GAP: 10 (ref 5–15)
AST: 29 U/L (ref 15–41)
Alkaline Phosphatase: 76 U/L (ref 38–126)
BUN: 18 mg/dL (ref 6–20)
CO2: 22 mmol/L (ref 22–32)
Calcium: 9.4 mg/dL (ref 8.9–10.3)
Chloride: 103 mmol/L (ref 101–111)
Creatinine, Ser: 0.9 mg/dL (ref 0.44–1.00)
GFR calc Af Amer: >60 mL/min (ref >60)
GFR calc non Af Amer: 58 mL/min — ABNORMAL LOW (ref >60)
GLUCOSE: 121 mg/dL — AB (ref 65–99)
POTASSIUM: 3.7 mmol/L (ref 3.5–5.1)
SODIUM: 135 mmol/L (ref 135–145)
Total Bilirubin: 1 mg/dL (ref 0.3–1.2)
Total Protein: 7 g/dL (ref 6.5–8.1)

## 2017-11-01 LAB — URINE CULTURE: CULTURE: NO GROWTH

## 2017-11-01 LAB — LIPASE, BLOOD: Lipase: 33 U/L (ref 11–51)

## 2017-11-01 MED ORDER — SODIUM CHLORIDE 0.9 % IV BOLUS (SEPSIS)
1000.0000 mL | Freq: Once | INTRAVENOUS | Status: AC
  Administered 2017-11-01: 1000 mL via INTRAVENOUS

## 2017-11-01 NOTE — Telephone Encounter (Signed)
Patient states she is still having diarrhea,    All night 10 - 12 or more times having stomach pain,  Has been using brat diet, but continues to have lower abdominal pain and diarrhea. Still feels like as a UTI still feels like she needs to have a big bowel movement but can only get the small mushy diarrhea.

## 2017-11-01 NOTE — ED Triage Notes (Signed)
Abdominal pain x 4 days. States was on sipro for possible bladder instruction. Had diarrhea and thought it was from antibiotic. States got a call to come in because her CT showed inflammation of her intestine.

## 2017-11-01 NOTE — ED Notes (Signed)
Pt states she was Rx cipro for UTI and within 4 hrs had severe diarrhea, states she came back on Friday night and had a CT, states she has continued to have severe mustard colored, mucousy loose stools after only taking 3 doses of the cipro.. Pt is a/ox4, in NAD. Pt ambulatory from [place] to ED36 without difficulty.

## 2017-11-01 NOTE — ED Provider Notes (Signed)
[facility] Emergency Department Provider Note _   First MD Initiated Contact with Patient 11/01/17 1640     (approximate)  I have reviewed the triage vital signs and the nursing notes.   HISTORY  Chief Complaint Abdominal Pain    HPI Brenda Cardenas is a 84 y.o. female with below list of chronic medical conditions and recent ED visit for UTI and colitis returns to the ED referred by PCP secondary to diarrhea. Patient states stool is "soft and mushy" not liquid. Patient denies any abdominal pain no vomiting or fever. Patient denies any urinary symptoms   Past Medical History:  Diagnosis Date  . Arthritis   . Chronic kidney disease   . Colon polyps   . GERD (gastroesophageal reflux disease)   . Hyperlipidemia   . Hypertension     Patient Active Problem List   Diagnosis Date Noted  . Recurrent UTI (urinary tract infection) 06/26/2017  . Pain in limb 02/16/2017  . Constipation 07/14/2016  . Hypovitaminosis D 06/23/2016  . Venous stasis of both lower extremities 06/23/2016  . Essential hypertension, benign 12/24/2015  . History of colonic polyps 12/24/2015  . Nocturnal leg cramps 12/24/2015  . DDD (degenerative disc disease), lumbar 08/13/2015  . Right-sided low back pain without sciatica 08/13/2015  . Spinal stenosis of lumbar region 08/13/2015  . Idiopathic scoliosis 06/24/2015  . Rhinitis due food 12/17/2014  . Osteoarthritis of cervical spine 02/25/2014  . Osteoporosis 06/18/2013  . Encounter for Medicare annual wellness exam 06/18/2013  . Hiatal hernia 06/16/2013  . GERD (gastroesophageal reflux disease) 05/02/2013  . S/P total knee replacement 05/02/2013  . Overweight 05/02/2013  . Recurrent cystitis 05/02/2013    Past Surgical History:  Procedure Laterality Date  . APPENDECTOMY  1959  . CHOLECYSTECTOMY  1995  . JOINT REPLACEMENT Right July 2009   [place]   . TONSILLECTOMY AND ADENOIDECTOMY      Prior to Admission medications     Medication Sig Start Date End Date Taking? Authorizing Provider  acetaminophen (TYLENOL) 325 MG tablet Take 650 mg by mouth 2 (two) times daily. May take up to four  Per day/ 2 in the morning and 2 in the afternoon    [provider]  atenolol (TENORMIN) 25 MG tablet TAKE 1 TABLET BY MOUTH DAILY 09/02/17   Crecencio Mc, MD  calcium citrate-vitamin D (CITRACAL+D) 315-200 MG-UNIT per tablet Take 2 tablets by mouth daily.    [provider]  Cholecalciferol (VITAMIN D3) 1000 units CAPS Take 1 capsule by mouth daily.    [provider]  ciprofloxacin (CIPRO) 250 MG tablet Take 1 tablet (250 mg total) by mouth 2 (two) times daily. 10/27/17   Crecencio Mc, MD  CRANBERRY PO Take 1 capsule by mouth daily.     [provider]  dicyclomine (BENTYL) 20 MG tablet Take 1 tablet (20 mg total) by mouth every 6 (six) hours as needed. 10/30/17   Paulette Blanch, MD  ezetimibe (ZETIA) 10 MG tablet TAKE 1 TABLET DAILY 10/28/17   Crecencio Mc, MD  Multiple Vitamins-Minerals (CENTRUM SILVER 50+WOMEN PO) 1 tablet by mouth every day    [provider]  omeprazole (PRILOSEC) 20 MG capsule TAKE ONE CAPSULE BY MOUTH TWICE DAILY 30-60 MINUTES BEFORE MEALS 09/08/17   Crecencio Mc, MD  Probiotic Product (ALIGN PO) Take by mouth daily.    [provider]  raloxifene (EVISTA) 60 MG tablet raloxifene 60 mg tablet  [provider]    Allergies Clindamycin/lincomycin; Sulfa antibiotics; Augmentin [amoxicillin-pot clavulanate]; Naproxen sodium; Tramadol; Acetaminophen; Codeine; Doxycycline; Doxycycline hyclate; E-mycin [erythromycin]; Lipitor [atorvastatin]; Macrobid [nitrofurantoin]; Nitrofurantoin monohyd macro; Nsaids; Phenobarbital; Relafen [nabumetone]; Statins; Sulfacetamide sodium; Sulfasalazine; and Tramadol hcl  Family History  Problem Relation Age of Onset  . Heart disease Mother   . Arthritis Mother   . Diabetes Father     Social History Social  History   Tobacco Use  . Smoking status: Never Smoker  . Smokeless tobacco: Never Used  Substance Use Topics  . Alcohol use: No    Alcohol/week: 0.0 oz  . Drug use: No    Review of Systems Constitutional: No fever/chills Eyes: No visual changes. ENT: No sore throat. Cardiovascular: Denies chest pain. Respiratory: Denies shortness of breath. Gastrointestinal: No abdominal pain.  No nausea, no vomiting.  Positive diarrhea.  No constipation. Genitourinary: Negative for dysuria. Musculoskeletal: Negative for neck pain.  Negative for back pain. Integumentary: Negative for rash. Neurological: Negative for headaches, focal weakness or numbness.   ____________________________________________   PHYSICAL EXAM:  VITAL SIGNS: ED Triage Vitals  Enc Vitals Group     BP 11/01/17 1349 (!) 173/108     Pulse Rate 11/01/17 1349 88     Resp 11/01/17 1349 (!) 180     Temp 11/01/17 1349 98.9 F (37.2 C)     Temp Source 11/01/17 1349 Oral     SpO2 11/01/17 1349 97 %     Weight 11/01/17 1350 70.3 kg (155 lb)     Height 11/01/17 1350 1.676 m (5\' 6" )     Head Circumference --      Peak Flow --      Pain Score 11/01/17 1350 0     Pain Loc --      Pain Edu? --      Excl. in [place]? --     Constitutional: Alert and oriented. Well appearing and in no acute distress. Eyes: Conjunctivae are normal.  Head: Atraumatic. Mouth/Throat: Mucous membranes are dry.  Oropharynx non-erythematous. Neck: No stridor.   Cardiovascular: Normal rate, regular rhythm. Good peripheral circulation. Grossly normal heart sounds. Respiratory: Normal respiratory effort.  No retractions. Lungs CTAB. Gastrointestinal: Soft and nontender. No distention.  Musculoskeletal: No lower extremity tenderness nor edema. No gross deformities of extremities. Neurologic:  Normal speech and language. No gross focal neurologic deficits are appreciated.  Skin:  Skin is warm, dry and intact. No rash noted. Psychiatric: Mood and affect  are normal. Speech and behavior are normal.  ____________________________________________   LABS (all labs ordered are listed, but only abnormal results are displayed)  Labs Reviewed  COMPREHENSIVE METABOLIC PANEL - Abnormal; Notable for the following components:      Result Value   Glucose, Bld 121 (*)    GFR calc non Af Amer 58 (*)    All other components within normal limits  CBC - Abnormal; Notable for the following components:   WBC 14.6 (*)    RDW 17.8 (*)    All other components within normal limits  LIPASE, BLOOD      Procedures   ____________________________________________   INITIAL IMPRESSION / ASSESSMENT AND PLAN / ED COURSE  As part of my medical decision making, I reviewed the following data within the electronic MEDICAL RECORD NUMBER Patient with colitis confirmed on previous CT with continued diarrhea. Stool cultures including C diff negative. Patient appears dehydrated and as such 1 liter NS administered. Patient has no abdominal pain. I spoke with  Dr Derrel Nip who agreed with plan and will see the patient in follow-up. I also referred the patient to Dr Vira Agar (gastroenterology) ____________________________________________  FINAL CLINICAL IMPRESSION(S) / ED DIAGNOSES  Final diagnoses:  Colitis     MEDICATIONS GIVEN DURING THIS VISIT:  Medications  sodium chloride 0.9 % bolus 1,000 mL (1,000 mLs Intravenous New Bag/Given 11/01/17 1812)  sodium chloride 0.9 % bolus 1,000 mL (1,000 mLs Intravenous New Bag/Given 11/01/17 1812)     ED Discharge Orders    None       Note:  This document was prepared using Dragon voice recognition software and may include unintentional dictation errors.    Gregor Hams, MD 11/01/17 [phone]

## 2017-11-01 NOTE — Telephone Encounter (Signed)
Patient notified and is going to the ER.

## 2017-11-01 NOTE — Telephone Encounter (Signed)
The ER doctor did a CT scan and the colon was inflamed.  She was screened for for C dificile and other infectious sources and the tests were  negative. He told her to return if she became worse.  She sounds like she needs IV fluids and GI evaluation .  I recommend she return to the ER

## 2017-11-01 NOTE — Telephone Encounter (Signed)
Pt states she started taking Cipro and started having abd pain and diarrhea on Friday. Went to the [facility] on Saturday, stated she had had a GI bleed then. The diarrhea cleared up yesterday but started up again around 1 am this morning and has been going every since.  Per protocol the office was notified by flow.  Reason for Disposition . Caller has URGENT medication question about med that PCP prescribed and triager unable to answer question  Answer Assessment - Initial Assessment Questions 1. SYMPTOMS: "Do you have any symptoms?"     abd pain and diarrhea 2. SEVERITY: If symptoms are present, ask "Are they mild, moderate or severe?"     Severe, been having diarrhea since 1 am.  Protocols used: MEDICATION QUESTION CALL-A-AH

## 2017-11-02 ENCOUNTER — Telehealth: Payer: Self-pay | Admitting: Internal Medicine

## 2017-11-02 NOTE — Telephone Encounter (Signed)
Patient seen in ED for DX Colitis, scheduled patient for ED follow up 11/10/17, patient ask how long she should continue BRAT diet and when could she add chicken noodle soup, and patient is drinking Gatorade for hydration advised patient not over use gatorade also to use water.

## 2017-11-02 NOTE — Telephone Encounter (Addendum)
Tried to reach patient by phone answer left message to call office Pecnurse may give advice from PCP.

## 2017-11-02 NOTE — Telephone Encounter (Signed)
I would modify diet now and add jello,  Chicken or vegetable broth with nooles ok,  But No dairy no salads,  No cruciferous vegetables,  Mashed potatoes ok,

## 2017-11-02 NOTE — Telephone Encounter (Signed)
Patient returned call, advised of note by Dr. Derrel Nip 11/02/17, patient verbalized understanding, she says she has an appointment today with Dr. Everlene Balls with GI.

## 2017-11-03 ENCOUNTER — Telehealth: Payer: Self-pay | Admitting: Internal Medicine

## 2017-11-03 NOTE — Telephone Encounter (Signed)
Copied from [place] [id number]. Topic: Quick Communication - See Telephone Encounter >> Nov 03, 2017  1:45 PM Robina Ade, Helene Kelp D wrote: CRM for notification. See Telephone encounter for: 11/03/17. Patient called and would like to talk to Dr. Derrel Nip CMA Juliann Pulse about everything she is going through. Patient said that the loose stool is back again. Please call patient back, thanks.

## 2017-11-04 NOTE — Telephone Encounter (Signed)
Has had 3 good nights , some formed stools, just wanted to update see GI next week.

## 2017-11-04 NOTE — Telephone Encounter (Signed)
Tried to reach patient by phone no answer left message to call office.

## 2017-11-10 ENCOUNTER — Encounter: Payer: Self-pay | Admitting: Internal Medicine

## 2017-11-10 ENCOUNTER — Ambulatory Visit (INDEPENDENT_AMBULATORY_CARE_PROVIDER_SITE_OTHER): Payer: Medicare Other | Admitting: Internal Medicine

## 2017-11-10 VITALS — BP 130/82 | HR 71 | Temp 98.0°F | Resp 15 | Ht 66.0 in | Wt 155.0 lb

## 2017-11-10 DIAGNOSIS — N390 Urinary tract infection, site not specified: Secondary | ICD-10-CM | POA: Diagnosis not present

## 2017-11-10 DIAGNOSIS — E559 Vitamin D deficiency, unspecified: Secondary | ICD-10-CM | POA: Diagnosis not present

## 2017-11-10 DIAGNOSIS — K529 Noninfective gastroenteritis and colitis, unspecified: Secondary | ICD-10-CM | POA: Diagnosis not present

## 2017-11-10 DIAGNOSIS — D72823 Leukemoid reaction: Secondary | ICD-10-CM

## 2017-11-10 DIAGNOSIS — I10 Essential (primary) hypertension: Secondary | ICD-10-CM

## 2017-11-10 NOTE — Progress Notes (Signed)
Subjective:  Patient ID: Brenda Cardenas, female    DOB: 1931-12-28  Age: 84 y.o. MRN: [id number]  CC: The primary encounter diagnosis was Essential hypertension, benign. Diagnoses of Hypovitaminosis D, Leukemoid reaction, Recurrent UTI (urinary tract infection), and Colitis presumed infectious were also pertinent to this visit.  HPI Brenda Cardenas presents for ER follow up on Feb 2 and again on feb 4  p  For hematochezia and watery stools.  diagnosed with colitis of uncertain etiology.  symptoms started after taking cipro for UTI  Developed RLQ  Cramping, fecal urgency and hematochezia /iarrhea.  Her C dif Test was negative but her CT scan showed colitis and she was given IV fluids for dehydration and GI folow up  Was done .  Saw [place] on Feb 5,  everything was improving,  Then she had another loose stool and   Wanted to be rechecked for c dif which was not done at [facility] advice (too soon)   Has photographed her stools and brought them with her today for review . stools are solidifying mostly,    No blood no fevers.  the blood tests were reviewed with patient . Still on the BRAT diet.  .  Using noodles,  potatoes and rice.  currently no urinary symptoms.    2) thigh hematoma. Had a Fall on Jan 20 tripped over cane, while carrying packages.  Had stitches to forehead.  Healing well .  hematoma to left lateral thigh now almost resolved.    Outpatient Medications Prior to Visit  Medication Sig Dispense Refill  . acetaminophen (TYLENOL) 325 MG tablet Take 650 mg by mouth 2 (two) times daily. May take up to four  Per day/ 2 in the morning and 2 in the afternoon    . atenolol (TENORMIN) 25 MG tablet TAKE 1 TABLET BY MOUTH DAILY 90 tablet 1  . calcium citrate-vitamin D (CITRACAL+D) 315-200 MG-UNIT per tablet Take 2 tablets by mouth daily.    . Cholecalciferol (VITAMIN D3) 1000 units CAPS Take 1 capsule by mouth daily.    Marland Kitchen CRANBERRY PO Take 1 capsule by mouth daily.     Marland Kitchen dicyclomine (BENTYL) 20 MG  tablet Take 1 tablet (20 mg total) by mouth every 6 (six) hours as needed. 20 tablet 0  . ezetimibe (ZETIA) 10 MG tablet TAKE 1 TABLET DAILY 30 tablet 3  . Multiple Vitamins-Minerals (CENTRUM SILVER 50+WOMEN PO) 1 tablet by mouth every day    . omeprazole (PRILOSEC) 20 MG capsule TAKE ONE CAPSULE BY MOUTH TWICE DAILY 30-60 MINUTES BEFORE MEALS 60 capsule 3  . Probiotic Product (ALIGN PO) Take by mouth daily.    . ciprofloxacin (CIPRO) 250 MG tablet Take 1 tablet (250 mg total) by mouth 2 (two) times daily. (Patient not taking: Reported on 11/10/2017) 6 tablet 0  . raloxifene (EVISTA) 60 MG tablet raloxifene 60 mg tablet     Facility-Administered Medications Prior to Visit  Medication Dose Route Frequency Provider Last Rate Last Dose  . denosumab (PROLIA) injection 60 mg  60 mg Subcutaneous Once Einar Pheasant, MD        Review of Systems;  Patient denies headache, fevers, malaise, unintentional weight loss, skin rash, eye pain, sinus congestion and sinus pain, sore throat, dysphagia,  hemoptysis , cough, dyspnea, wheezing, chest pain, palpitations, orthopnea, edema, abdominal pain, nausea, melena, diarrhea, constipation, flank pain, dysuria, hematuria, urinary  Frequency, nocturia, numbness, tingling, seizures,  Focal weakness, Loss of consciousness,  Tremor, insomnia, depression, anxiety, and suicidal ideation.  Objective:  BP 130/82 (BP Location: Left Arm, Patient Position: Sitting, Cuff Size: Normal)   Pulse 71   Temp 98 F (36.7 C) (Oral)   Resp 15   Ht 5\' 6"  (1.676 m)   Wt 155 lb (70.3 kg)   SpO2 98%   BMI 25.02 kg/m   BP Readings from Last 3 Encounters:  11/10/17 130/82  11/01/17 136/78  10/30/17 (!) 145/85    Wt Readings from Last 3 Encounters:  11/10/17 155 lb (70.3 kg)  11/01/17 155 lb (70.3 kg)  10/25/17 159 lb (72.1 kg)    General appearance: alert, cooperative and appears stated age Ears: normal TM's and external ear canals both ears Throat: lips, mucosa,  and tongue normal; teeth and gums normal Neck: no adenopathy, no carotid bruit, supple, symmetrical, trachea midline and thyroid not enlarged, symmetric, no tenderness/mass/nodules Back: symmetric, no curvature. ROM normal. No CVA tenderness. Lungs: clear to auscultation bilaterally Heart: regular rate and rhythm, S1, S2 normal, no murmur, click, rub or gallop Abdomen: soft, non-tender; bowel sounds normal; no masses,  no organomegaly Pulses: 2+ and symmetric Skin: resolving hematoma left lateral thigh.  Skin color, texture, turgor normal. No rashes or lesions Lymph nodes: Cervical, supraclavicular, and axillary nodes normal.  Lab Results  Component Value Date   HGBA1C 5.5 06/16/2017    Lab Results  Component Value Date   CREATININE 0.90 11/01/2017   CREATININE 0.90 10/29/2017   CREATININE 0.84 06/16/2017    Lab Results  Component Value Date   WBC 14.6 (H) 11/01/2017   HGB 12.8 11/01/2017   HCT 37.8 11/01/2017   PLT 242 11/01/2017   GLUCOSE 121 (H) 11/01/2017   CHOL 186 06/16/2017   TRIG 129.0 06/16/2017   HDL 65.80 06/16/2017   LDLDIRECT 103.0 06/16/2017   LDLCALC 95 06/16/2017   ALT 19 11/01/2017   AST 29 11/01/2017   NA 135 11/01/2017   K 3.7 11/01/2017   CL 103 11/01/2017   CREATININE 0.90 11/01/2017   BUN 18 11/01/2017   CO2 22 11/01/2017   TSH 0.78 12/14/2016   HGBA1C 5.5 06/16/2017   MICROALBUR 1.6 06/16/2017    No results found.  Assessment & Plan:   Problem List Items Addressed This Visit    Essential hypertension, benign - Primary   Relevant Orders   Lipid panel   Comprehensive metabolic panel   Hypovitaminosis D   Recurrent UTI (urinary tract infection)    Currently asymptomatic.  No testing advised.       Colitis presumed infectious    Improving/resolving without antibiotics.  c dif test was negative.  Reassurance given.  Diet to be advanced.        Other Visit Diagnoses    Leukemoid reaction       Relevant Orders   CBC with  Differential/Platelet     A total of 40 minutes was spent with patient more than half of which was spent in counseling patient on the above mentioned issues , reviewing and explaining recent labs and imaging studies done, and coordination of care.  I have discontinued Rayana Riggi's raloxifene. I am also having her maintain her calcium citrate-vitamin D, acetaminophen, Probiotic Product (ALIGN PO), Vitamin D3, Multiple Vitamins-Minerals (CENTRUM SILVER 50+WOMEN PO), atenolol, omeprazole, CRANBERRY PO, ciprofloxacin, ezetimibe, and dicyclomine. We will continue to administer denosumab.  No orders of the defined types were placed in this encounter.   Medications Discontinued During This Encounter  Medication Reason  . raloxifene (EVISTA) 60 MG tablet Patient has not  taken in last 30 days    Follow-up: Return for fasting labs prior to feb 27 OV .   Crecencio Mc, MD

## 2017-11-10 NOTE — Patient Instructions (Signed)
You may still have occasional loose stools for the next 2 weeks while your intestines recover   You can eat any grilled chicken,  Fish,  Beef  Lab, Or roasted meats   Avoid heavily spiced things,  Broccoli,  Cauliflower and brussels sprouts unless well cooked (to avoid gas)  Take Beano  To dinner with you.  Avoid salads and fried foods   Steak,  Mashed potatoes and cooked carrots.  Would be a great safe first meal.    48 ounces of water, or other noncaffeinated beverages .

## 2017-11-11 DIAGNOSIS — K529 Noninfective gastroenteritis and colitis, unspecified: Secondary | ICD-10-CM | POA: Insufficient documentation

## 2017-11-11 NOTE — Assessment & Plan Note (Signed)
Improving/resolving without antibiotics.  c dif test was negative.  Reassurance given.  Diet to be advanced.

## 2017-11-11 NOTE — Assessment & Plan Note (Signed)
Currently asymptomatic.  No testing advised.

## 2017-11-22 ENCOUNTER — Encounter: Payer: Self-pay | Admitting: Internal Medicine

## 2017-11-24 ENCOUNTER — Encounter: Payer: Self-pay | Admitting: Internal Medicine

## 2017-12-11 ENCOUNTER — Encounter: Payer: Self-pay | Admitting: Internal Medicine

## 2017-12-15 ENCOUNTER — Ambulatory Visit (INDEPENDENT_AMBULATORY_CARE_PROVIDER_SITE_OTHER): Payer: Medicare Other | Admitting: *Deleted

## 2017-12-15 ENCOUNTER — Other Ambulatory Visit (INDEPENDENT_AMBULATORY_CARE_PROVIDER_SITE_OTHER): Payer: Medicare Other

## 2017-12-15 DIAGNOSIS — M810 Age-related osteoporosis without current pathological fracture: Secondary | ICD-10-CM

## 2017-12-15 DIAGNOSIS — D72823 Leukemoid reaction: Secondary | ICD-10-CM | POA: Diagnosis not present

## 2017-12-15 DIAGNOSIS — I10 Essential (primary) hypertension: Secondary | ICD-10-CM

## 2017-12-15 LAB — CBC WITH DIFFERENTIAL/PLATELET
BASOS ABS: 0.1 10*3/uL (ref 0.0–0.1)
BASOS PCT: 0.8 % (ref 0.0–3.0)
Eosinophils Absolute: 0.3 10*3/uL (ref 0.0–0.7)
Eosinophils Relative: 4 % (ref 0.0–5.0)
HEMATOCRIT: 38.1 % (ref 36.0–46.0)
Hemoglobin: 12.7 g/dL (ref 12.0–15.0)
LYMPHS PCT: 23.2 % (ref 12.0–46.0)
Lymphs Abs: 2 10*3/uL (ref 0.7–4.0)
MCHC: 33.3 g/dL (ref 30.0–36.0)
MCV: 96.9 fl (ref 78.0–100.0)
MONOS PCT: 6 % (ref 3.0–12.0)
Monocytes Absolute: 0.5 10*3/uL (ref 0.1–1.0)
NEUTROS ABS: 5.7 10*3/uL (ref 1.4–7.7)
Neutrophils Relative %: 66 % (ref 43.0–77.0)
PLATELETS: 186 10*3/uL (ref 150.0–400.0)
RBC: 3.93 Mil/uL (ref 3.87–5.11)
RDW: 16.9 % — ABNORMAL HIGH (ref 11.5–15.5)
WBC: 8.6 10*3/uL (ref 4.0–10.5)

## 2017-12-15 LAB — LIPID PANEL
CHOL/HDL RATIO: 4
Cholesterol: 189 mg/dL (ref 0–200)
HDL: 51.9 mg/dL (ref >39.00)
LDL Cholesterol: 114 mg/dL — ABNORMAL HIGH (ref 0–99)
NonHDL: 136.89
TRIGLYCERIDES: 115 mg/dL (ref 0.0–149.0)
VLDL: 23 mg/dL (ref 0.0–40.0)

## 2017-12-15 LAB — COMPREHENSIVE METABOLIC PANEL
ALT: 16 U/L (ref 0–35)
AST: 21 U/L (ref 0–37)
Albumin: 4.1 g/dL (ref 3.5–5.2)
Alkaline Phosphatase: 70 U/L (ref 39–117)
BILIRUBIN TOTAL: 0.4 mg/dL (ref 0.2–1.2)
BUN: 20 mg/dL (ref 6–23)
CALCIUM: 10.1 mg/dL (ref 8.4–10.5)
CO2: 25 mEq/L (ref 19–32)
CREATININE: 0.83 mg/dL (ref 0.40–1.20)
Chloride: 106 mEq/L (ref 96–112)
GFR: 69.61 mL/min (ref >60.00)
Glucose, Bld: 119 mg/dL — ABNORMAL HIGH (ref 70–99)
Potassium: 3.6 mEq/L (ref 3.5–5.1)
Sodium: 143 mEq/L (ref 135–145)
TOTAL PROTEIN: 6.4 g/dL (ref 6.0–8.3)

## 2017-12-15 MED ORDER — DENOSUMAB 60 MG/ML ~~LOC~~ SOLN
60.0000 mg | Freq: Once | SUBCUTANEOUS | Status: AC
  Administered 2017-12-15: 60 mg via SUBCUTANEOUS

## 2017-12-15 NOTE — Progress Notes (Signed)
Patient presented for Prolia injection to Right arm [place], patient voiced no concerns or complaints during or after injection. 

## 2017-12-21 ENCOUNTER — Ambulatory Visit: Payer: Medicare Other

## 2017-12-22 ENCOUNTER — Ambulatory Visit (INDEPENDENT_AMBULATORY_CARE_PROVIDER_SITE_OTHER): Payer: Medicare Other | Admitting: Internal Medicine

## 2017-12-22 ENCOUNTER — Encounter: Payer: Self-pay | Admitting: Internal Medicine

## 2017-12-22 DIAGNOSIS — K529 Noninfective gastroenteritis and colitis, unspecified: Secondary | ICD-10-CM

## 2017-12-22 MED ORDER — ATENOLOL 25 MG PO TABS: 25.0000 mg | ORAL_TABLET | Freq: Every day | ORAL | 1 refills | Status: DC

## 2017-12-22 MED ORDER — OMEPRAZOLE 20 MG PO CPDR: 20.0000 mg | DELAYED_RELEASE_CAPSULE | Freq: Every day | ORAL | 1 refills | Status: DC

## 2017-12-22 NOTE — Patient Instructions (Addendum)
I think your bowels  are going through a phase of recovery Irritable Bowel Syndrome .Marland Kitchen  The dicyclomine can help reduce the cramping after meals if you take if 30 mintues prior to eating ,  Up to 4 times daily   Irritable Bowel Syndrome, Adult Irritable bowel syndrome (IBS) is not one specific disease. It is a group of symptoms that affects the organs responsible for digestion (gastrointestinal or GI tract). To regulate how your GI tract works, your body sends signals back and forth between your intestines and your brain. If you have IBS, there may be a problem with these signals. As a result, your GI tract does not function normally. Your intestines may become more sensitive and overreact to certain things. This is especially true when you eat certain foods or when you are under stress. There are four types of IBS. These may be determined based on the consistency of your stool:  IBS with diarrhea.  IBS with constipation.  Mixed IBS.  Unsubtyped IBS.  It is important to know which type of IBS you have. Some treatments are more likely to be helpful for certain types of IBS. What are the causes? The exact cause of IBS is not known. What increases the risk? You may have a higher risk of IBS if:  You are a woman.  You are younger than 82 years old.  You have a family history of IBS.  You have mental health problems.  You have had bacterial infection of your GI tract.  What are the signs or symptoms? Symptoms of IBS vary from person to person. The main symptom is abdominal pain or discomfort. Additional symptoms usually include one or more of the following:  Diarrhea, constipation, or both.  Abdominal swelling or bloating.  Feeling full or sick after eating a small or regular-size meal.  Frequent gas.  Mucus in the stool.  A feeling of having more stool left after a bowel movement.  Symptoms tend to come and go. They may be associated with stress, psychiatric conditions, or  nothing at all. How is this diagnosed? There is no specific test to diagnose IBS. Your health care provider will make a diagnosis based on a physical exam, medical history, and your symptoms. You may have other tests to rule out other conditions that may be causing your symptoms. These may include:  Blood tests.  X-rays.  CT scan.  Endoscopy and colonoscopy. This is a test in which your GI tract is viewed with a long, thin, flexible tube.  How is this treated? There is no cure for IBS, but treatment can help relieve symptoms. IBS treatment often includes:  Changes to your diet, such as: ? Eating more fiber. ? Avoiding foods that cause symptoms. ? Drinking more water. ? Eating regular, medium-sized portioned meals.  Medicines. These may include: ? Fiber supplements if you have constipation. ? Medicine to control diarrhea (antidiarrheal medicines). ? Medicine to help control muscle spasms in your GI tract (antispasmodic medicines). ? Medicines to help with any mental health issues, such as antidepressants or tranquilizers.  Therapy. ? Talk therapy may help with anxiety, depression, or other mental health issues that can make IBS symptoms worse.  Stress reduction. ? Managing your stress can help keep symptoms under control.  Follow these instructions at home:  Take medicines only as directed by your health care provider.  Eat a healthy diet. ? Avoid foods and drinks with added sugar. ? Include more whole grains, fruits, and vegetables gradually  into your diet. This may be especially helpful if you have IBS with constipation. ? Avoid any foods and drinks that make your symptoms worse. These may include dairy products and caffeinated or carbonated drinks. ? Do not eat large meals. ? Drink enough fluid to keep your urine clear or pale yellow.  Exercise regularly. Ask your health care provider for recommendations of good activities for you.  Keep all follow-up visits as  directed by your health care provider. This is important. Contact a health care provider if:  You have constant pain.  You have trouble or pain with swallowing.  You have worsening diarrhea. Get help right away if:  You have severe and worsening abdominal pain.  You have diarrhea and: ? You have a rash, stiff neck, or severe headache. ? You are irritable, sleepy, or difficult to awaken. ? You are weak, dizzy, or extremely thirsty.  You have bright red blood in your stool or you have black tarry stools.  You have unusual abdominal swelling that is painful.  You vomit continuously.  You vomit blood (hematemesis).  You have both abdominal pain and a fever. This information is not intended to replace advice given to you by your health care provider. Make sure you discuss any questions you have with your health care provider. Document Released: 09/14/2005 Document Revised: 02/14/2016 Document Reviewed: 06/01/2014 Elsevier Interactive Patient Education  2018 [employer].    Diet for Irritable Bowel Syndrome When you have irritable bowel syndrome (IBS), the foods you eat and your eating habits are very important. IBS may cause various symptoms, such as abdominal pain, constipation, or diarrhea. Choosing the right foods can help ease discomfort caused by these symptoms. Work with your health care provider and dietitian to find the best eating plan to help control your symptoms. What general guidelines do I need to follow?  Keep a food diary. This will help you identify foods that cause symptoms. Write down: ? What you eat and when. ? What symptoms you have. ? When symptoms occur in relation to your meals.  Avoid foods that cause symptoms. Talk with your dietitian about other ways to get the same nutrients that are in these foods.  Eat more foods that contain fiber. Take a fiber supplement if directed by your dietitian.  Eat your meals slowly, in a relaxed setting.  Aim to eat  5-6 small meals per day. Do not skip meals.  Drink enough fluids to keep your urine clear or pale yellow.  Ask your health care provider if you should take an over-the-counter probiotic during flare-ups to help restore healthy gut bacteria.  If you have cramping or diarrhea, try making your meals low in fat and high in carbohydrates. Examples of carbohydrates are pasta, rice, whole grain breads and cereals, fruits, and vegetables.  If dairy products cause your symptoms to flare up, try eating less of them. You might be able to handle yogurt better than other dairy products because it contains bacteria that help with digestion. What foods are not recommended? The following are some foods and drinks that may worsen your symptoms:  Fatty foods, such as [place] fries.  Milk products, such as cheese or ice cream.  Chocolate.  Alcohol.  Products with caffeine, such as coffee.  Carbonated drinks, such as soda.  The items listed above may not be a complete list of foods and beverages to avoid. Contact your dietitian for more information. What foods are good sources of fiber? Your health care provider  or dietitian may recommend that you eat more foods that contain fiber. Fiber can help reduce constipation and other IBS symptoms. Add foods with fiber to your diet a little at a time so that your body can get used to them. Too much fiber at once might cause gas and swelling of your abdomen. The following are some foods that are good sources of fiber:  Apples.  Peaches.  Pears.  Berries.  Figs.  Broccoli (raw).  Cabbage.  Carrots.  Raw peas.  Kidney beans.  Lima beans.  Whole grain bread.  Whole grain cereal.  Where to find more information: [employer] for Functional Gastrointestinal Disorders: www.iffgd.[employer] of Diabetes and Digestive and Kidney Diseases:  NetworkAffair.co.za.aspx This information is not intended to replace advice given to you by your health care provider. Make sure you discuss any questions you have with your health care provider. Document Released: 12/05/2003 Document Revised: 02/20/2016 Document Reviewed: 12/15/2013 Elsevier Interactive Patient Education  2018 [employer].

## 2017-12-22 NOTE — Progress Notes (Signed)
Subjective:  Patient ID: Brenda Cardenas, female    DOB: 1932-07-03  Age: 84 y.o. MRN: [id number]  CC: The encounter diagnosis was Colitis presumed infectious.  HPI Brenda Cardenas presents for follow up   Had a very difficult Jan/FEb.  Fell in January,  Left leg hematoma,  Forehead laceration requring stitches    Took amox for dental surgery to repair chipped tooth   Treated in ED  Nov 01 2017 for acute colitis  confirmed by CT ,  Stool studies negative .  Remains very concerned about the quality and quantity of her bowel movements ,  Had 5 formed small caliber stools yesterday,  Called Gi  To discuss possible  Sigmoidoscopy.  Some meals causing loose stools.   Still feels uncomfortable in the  suprapubic area  At times .  Has not tried bentyl but has it from ED.  Taking Align daily   Tolerating yogurt and dairy without diarrhea.   Received Prolia last dose March 20.   Recurrent UTI:  Sees [facility] every 6 months with  [facility] .  Uses fosfomycin for UTI E  Coli     Outpatient Medications Prior to Visit  Medication Sig Dispense Refill  . acetaminophen (TYLENOL) 325 MG tablet Take 650 mg by mouth 2 (two) times daily. May take up to four  Per day/ 2 in the morning and 2 in the afternoon    . calcium citrate-vitamin D (CITRACAL+D) 315-200 MG-UNIT per tablet Take 2 tablets by mouth daily.    . Cholecalciferol (VITAMIN D3) 1000 units CAPS Take 1 capsule by mouth daily.    Marland Kitchen CRANBERRY PO Take 1 capsule by mouth daily.     Marland Kitchen ezetimibe (ZETIA) 10 MG tablet TAKE 1 TABLET DAILY 30 tablet 3  . Multiple Vitamins-Minerals (CENTRUM SILVER 50+WOMEN PO) 1 tablet by mouth every day    . Probiotic Product (ALIGN PO) Take by mouth daily.    Marland Kitchen atenolol (TENORMIN) 25 MG tablet TAKE 1 TABLET BY MOUTH DAILY 90 tablet 1  . omeprazole (PRILOSEC) 20 MG capsule TAKE ONE CAPSULE BY MOUTH TWICE DAILY 30-60 MINUTES BEFORE MEALS 60 capsule 3  . ciprofloxacin (CIPRO) 250 MG tablet Take 1 tablet (250 mg total) by mouth 2 (two)  times daily. (Patient not taking: Reported on 11/10/2017) 6 tablet 0  . dicyclomine (BENTYL) 20 MG tablet Take 1 tablet (20 mg total) by mouth every 6 (six) hours as needed. (Patient not taking: Reported on 12/22/2017) 20 tablet 0   Facility-Administered Medications Prior to Visit  Medication Dose Route Frequency Provider Last Rate Last Dose  . denosumab (PROLIA) injection 60 mg  60 mg Subcutaneous Once Einar Pheasant, MD        Review of Systems;  Patient denies headache, fevers, malaise, unintentional weight loss, skin rash, eye pain, sinus congestion and sinus pain, sore throat, dysphagia,  hemoptysis , cough, dyspnea, wheezing, chest pain, palpitations, orthopnea, edema, abdominal pain, nausea, melena, diarrhea, constipation, flank pain, dysuria, hematuria, urinary  Frequency, nocturia, numbness, tingling, seizures,  Focal weakness, Loss of consciousness,  Tremor, insomnia, depression, anxiety, and suicidal ideation.      Objective:  BP 104/60 (BP Location: Left Arm, Patient Position: Sitting, Cuff Size: Normal)   Pulse 74   Temp 97.6 F (36.4 C) (Oral)   Resp 15   Ht 5\' 6"  (1.676 m)   Wt 154 lb 9.6 oz (70.1 kg)   SpO2 97%   BMI 24.95 kg/m   BP Readings from Last 3 Encounters:  12/22/17 104/60  11/10/17 130/82  11/01/17 136/78    Wt Readings from Last 3 Encounters:  12/22/17 154 lb 9.6 oz (70.1 kg)  11/10/17 155 lb (70.3 kg)  11/01/17 155 lb (70.3 kg)    General appearance: alert, cooperative and appears stated age Ears: normal TM's and external ear canals both ears Throat: lips, mucosa, and tongue normal; teeth and gums normal Neck: no adenopathy, no carotid bruit, supple, symmetrical, trachea midline and thyroid not enlarged, symmetric, no tenderness/mass/nodules Back: symmetric, no curvature. ROM normal. No CVA tenderness. Lungs: clear to auscultation bilaterally Heart: regular rate and rhythm, S1, S2 normal, no murmur, click, rub or gallop Abdomen: soft,  non-tender; bowel sounds normal; no masses,  no organomegaly Pulses: 2+ and symmetric Skin: Skin color, texture, turgor normal. No rashes or lesions Lymph nodes: Cervical, supraclavicular, and axillary nodes normal.  Lab Results  Component Value Date   HGBA1C 5.5 06/16/2017    Lab Results  Component Value Date   CREATININE 0.83 12/15/2017   CREATININE 0.90 11/01/2017   CREATININE 0.90 10/29/2017    Lab Results  Component Value Date   WBC 8.6 12/15/2017   HGB 12.7 12/15/2017   HCT 38.1 12/15/2017   PLT 186.0 12/15/2017   GLUCOSE 119 (H) 12/15/2017   CHOL 189 12/15/2017   TRIG 115.0 12/15/2017   HDL 51.90 12/15/2017   LDLDIRECT 103.0 06/16/2017   LDLCALC 114 (H) 12/15/2017   ALT 16 12/15/2017   AST 21 12/15/2017   NA 143 12/15/2017   K 3.6 12/15/2017   CL 106 12/15/2017   CREATININE 0.83 12/15/2017   BUN 20 12/15/2017   CO2 25 12/15/2017   TSH 0.78 12/14/2016   HGBA1C 5.5 06/16/2017   MICROALBUR 1.6 06/16/2017    No results found.  Assessment & Plan:   Problem List Items Addressed This Visit    Colitis presumed infectious    Treated Feb 4 by ED physician for changes c/w acute colitis .  Bowels are still not moving at her baseline but seh does not appear to have ongoing infection given her formed stools.  Behaving like IBS., discussed diagnosis and Will treat like IBS         I have discontinued Brenda Cardenas's ciprofloxacin and dicyclomine. I have also changed her omeprazole and atenolol. Additionally, I am having her maintain her calcium citrate-vitamin D, acetaminophen, Probiotic Product (ALIGN PO), Vitamin D3, Multiple Vitamins-Minerals (CENTRUM SILVER 50+WOMEN PO), CRANBERRY PO, and ezetimibe. We will continue to administer denosumab.  Meds ordered this encounter  Medications  . omeprazole (PRILOSEC) 20 MG capsule    Sig: Take 1 capsule (20 mg total) by mouth daily.    Dispense:  90 capsule    Refill:  1    Keep on file for future refills  Note dose  reduction to once daily  . atenolol (TENORMIN) 25 MG tablet    Sig: Take 1 tablet (25 mg total) by mouth daily.    Dispense:  90 tablet    Refill:  1    Keep on file for future refills    Medications Discontinued During This Encounter  Medication Reason  . ciprofloxacin (CIPRO) 250 MG tablet Completed Course  . dicyclomine (BENTYL) 20 MG tablet   . omeprazole (PRILOSEC) 20 MG capsule   . atenolol (TENORMIN) 25 MG tablet Reorder    Follow-up: Return in about 3 months (around 03/24/2018), or bowel problems.   Crecencio Mc, MD

## 2017-12-25 NOTE — Assessment & Plan Note (Addendum)
Treated Feb 4 by ED physician for changes c/w acute colitis .  Bowels are still not moving at her baseline but she does not appear to have ongoing infection given her formed stools.  Behaving like IBS., discussed diagnosis and Will treat like IBS

## 2018-01-25 ENCOUNTER — Encounter: Payer: Self-pay | Admitting: Anesthesiology

## 2018-01-25 ENCOUNTER — Other Ambulatory Visit: Payer: Self-pay

## 2018-01-25 ENCOUNTER — Ambulatory Visit: Payer: Medicare Other | Attending: Anesthesiology | Admitting: Anesthesiology

## 2018-01-25 VITALS — BP 147/82 | HR 66 | Temp 98.3°F | Resp 18 | Ht 66.0 in | Wt 149.0 lb

## 2018-01-25 DIAGNOSIS — M545 Low back pain, unspecified: Secondary | ICD-10-CM

## 2018-01-25 DIAGNOSIS — M5136 Other intervertebral disc degeneration, lumbar region: Secondary | ICD-10-CM | POA: Diagnosis not present

## 2018-01-25 DIAGNOSIS — M5441 Lumbago with sciatica, right side: Secondary | ICD-10-CM | POA: Diagnosis not present

## 2018-01-25 DIAGNOSIS — M4125 Other idiopathic scoliosis, thoracolumbar region: Secondary | ICD-10-CM | POA: Diagnosis not present

## 2018-01-25 DIAGNOSIS — M48061 Spinal stenosis, lumbar region without neurogenic claudication: Secondary | ICD-10-CM | POA: Insufficient documentation

## 2018-01-25 DIAGNOSIS — Z79899 Other long term (current) drug therapy: Secondary | ICD-10-CM | POA: Diagnosis not present

## 2018-01-25 NOTE — Progress Notes (Signed)
Safety precautions to be maintained throughout the outpatient stay will include: orient to surroundings, keep bed in low position, maintain call bell within reach at all times, provide assistance with transfer out of bed and ambulation.  

## 2018-01-25 NOTE — Patient Instructions (Signed)
____________________________________________________________________________________________  Preparing for your procedure (without sedation)  Instructions: . Oral Intake: Do not eat or drink anything for at least 3 hours prior to your procedure. . Transportation: Unless otherwise stated by your physician, you may drive yourself after the procedure. . Blood Pressure Medicine: Take your blood pressure medicine with a sip of water the morning of the procedure. . Blood thinners:  . Diabetics on insulin: Notify the staff so that you can be scheduled 1st case in the morning. If your diabetes requires high dose insulin, take only  of your normal insulin dose the morning of the procedure and notify the staff that you have done so. . Preventing infections: Shower with an antibacterial soap the morning of your procedure.  . Build-up your immune system: Take 1000 mg of Vitamin C with every meal (3 times a day) the day prior to your procedure. . Antibiotics: Inform the staff if you have a condition or reason that requires you to take antibiotics before dental procedures. . Pregnancy: If you are pregnant, call and cancel the procedure. . Sickness: If you have a cold, fever, or any active infections, call and cancel the procedure. . Arrival: You must be in the facility at least 30 minutes prior to your scheduled procedure. . Children: Do not bring any children with you. . Dress appropriately: Bring dark clothing that you would not mind if they get stained. . Valuables: Do not bring any jewelry or valuables.  Procedure appointments are reserved for interventional treatments only. . No Prescription Refills. . No medication changes will be discussed during procedure appointments. . No disability issues will be discussed.  Remember:  Regular Business hours are:  Monday to Thursday 8:00 AM to 4:00 PM  Provider's Schedule: Francisco Naveira, MD:  Procedure days: Tuesday and Thursday 7:30 AM to 4:00  PM  Bilal Lateef, MD:  Procedure days: Monday and Wednesday 7:30 AM to 4:00 PM ____________________________________________________________________________________________    

## 2018-01-27 NOTE — Progress Notes (Signed)
Subjective:  Patient ID: Brenda Cardenas, female    DOB: 03/28/1932  Age: 84 y.o. MRN: [id number]  CC: Back Pain (lower)   Procedure: None  HPI Linzie Criss presents for reevaluation.  She is last seen in January at which point we did an epidural steroid injection for her.  Unfortunately she failed to gain significant relief at the L3-4 level.  She continues to have bilateral low back pain with radiation into the right flank region.  She also has tenderness over the midline thoracolumbar region.  She is received significant improvement with her epidurals in the past but this 1 was less effective for her.  She has significant spinal stenosis and this symptom complex is stable in nature.  No changes in the quality characteristic distribution of her low back pain are noted at this time.  Her motor strength is remained at baseline.  Otherwise she is in her usual state of health at this point.  No change in bowel or bladder function is noted.  She still using Tylenol as her primary medication for pain relief.  She has failed physical therapy modality and is a nonsurgical candidate.  Outpatient Medications Prior to Visit  Medication Sig Dispense Refill  . acetaminophen (TYLENOL) 325 MG tablet Take 650 mg by mouth 2 (two) times daily. May take up to four  Per day/ 2 in the morning and 2 in the afternoon    . atenolol (TENORMIN) 25 MG tablet Take 1 tablet (25 mg total) by mouth daily. 90 tablet 1  . calcium citrate-vitamin D (CITRACAL+D) 315-200 MG-UNIT per tablet Take 2 tablets by mouth daily.    . Cholecalciferol (VITAMIN D3) 1000 units CAPS Take 1 capsule by mouth daily.    Marland Kitchen CRANBERRY PO Take 1 capsule by mouth daily.     Marland Kitchen ezetimibe (ZETIA) 10 MG tablet TAKE 1 TABLET DAILY 30 tablet 3  . Multiple Vitamins-Minerals (CENTRUM SILVER 50+WOMEN PO) 1 tablet by mouth every day    . omeprazole (PRILOSEC) 20 MG capsule Take 1 capsule (20 mg total) by mouth daily. 90 capsule 1  . Probiotic Product (ALIGN  PO) Take by mouth daily.     Facility-Administered Medications Prior to Visit  Medication Dose Route Frequency Provider Last Rate Last Dose  . denosumab (PROLIA) injection 60 mg  60 mg Subcutaneous Once Einar Pheasant, MD        Review of Systems CNS: No confusion or sedation Cardiac: No angina or palpitations GI: No abdominal pain or constipation Constitutional: No nausea vomiting fevers or chills  Objective:  BP (!) 147/82   Pulse 66   Temp 98.3 F (36.8 C) (Oral)   Resp 18   Ht 5\' 6"  (1.676 m)   Wt 149 lb (67.6 kg)   SpO2 100%   BMI 24.05 kg/m    BP Readings from Last 3 Encounters:  01/25/18 (!) 147/82  12/22/17 104/60  11/10/17 130/82     Wt Readings from Last 3 Encounters:  01/25/18 149 lb (67.6 kg)  12/22/17 154 lb 9.6 oz (70.1 kg)  11/10/17 155 lb (70.3 kg)     Physical Exam Pt is alert and oriented PERRL EOMI HEART IS RRR no murmur or rub LCTA no wheezing or rales MUSCULOSKELETAL reveals a trigger point at L1.  She also has tenderness across the midline lumbar region at L5.  She has some mild tenderness over the right posterior flank consistent with previous examination.  Her muscle tone and bulk in the lower extremities  is at baseline.  Labs  Lab Results  Component Value Date   HGBA1C 5.5 06/16/2017   Lab Results  Component Value Date   MICROALBUR 1.6 06/16/2017   LDLCALC 114 (H) 12/15/2017   CREATININE 0.83 12/15/2017    -------------------------------------------------------------------------------------------------------------------- Lab Results  Component Value Date   WBC 8.6 12/15/2017   HGB 12.7 12/15/2017   HCT 38.1 12/15/2017   PLT 186.0 12/15/2017   GLUCOSE 119 (H) 12/15/2017   CHOL 189 12/15/2017   TRIG 115.0 12/15/2017   HDL 51.90 12/15/2017   LDLDIRECT 103.0 06/16/2017   LDLCALC 114 (H) 12/15/2017   ALT 16 12/15/2017   AST 21 12/15/2017   NA 143 12/15/2017   K 3.6 12/15/2017   CL 106 12/15/2017   CREATININE 0.83  12/15/2017   BUN 20 12/15/2017   CO2 25 12/15/2017   TSH 0.78 12/14/2016   HGBA1C 5.5 06/16/2017   MICROALBUR 1.6 06/16/2017    --------------------------------------------------------------------------------------------------------------------- No results found.   Assessment & Plan:   Brinklee was seen today for back pain.  Diagnoses and all orders for this visit:  Acute bilateral low back pain with right-sided sciatica -     Lumbar Epidural Injection; Future  Spinal stenosis of lumbar region without neurogenic claudication -     Cancel: Lumbar Epidural Injection; Future -     Lumbar Epidural Injection; Future  Other idiopathic scoliosis, thoracolumbar region  DDD (degenerative disc disease), lumbar  Low back pain at multiple sites        ----------------------------------------------------------------------------------------------------------------------  Problem List Items Addressed This Visit      Unprioritized   DDD (degenerative disc disease), lumbar   Idiopathic scoliosis   Spinal stenosis of lumbar region   Relevant Orders   Lumbar Epidural Injection    Other Visit Diagnoses    Acute bilateral low back pain with right-sided sciatica    -  Primary   Relevant Orders   Lumbar Epidural Injection   Low back pain at multiple sites            ----------------------------------------------------------------------------------------------------------------------  1. Acute bilateral low back pain with right-sided sciatica We will schedule her for return visit in approximately 1 month and look to do an L5-S1 epidural steroid injection for the spinal stenosis and right side L5 sciatica that she is experiencing.  She had this done previously at this level in the past per Dr. Phineas Douglas and received good relief with this. - Lumbar Epidural Injection; Future  2. Spinal stenosis of lumbar region without neurogenic claudication As above - Lumbar Epidural Injection;  Future  3. Other idiopathic scoliosis, thoracolumbar region Continue stretching strengthening exercises once again reviewed today  4. DDD (degenerative disc disease), lumbar As above  5. Low back pain at multiple sites Also plan on proceeding with L1 trigger point injection at her next visit.    ----------------------------------------------------------------------------------------------------------------------  I am having [name] maintain her calcium citrate-vitamin D, acetaminophen, Probiotic Product (ALIGN PO), Vitamin D3, Multiple Vitamins-Minerals (CENTRUM SILVER 50+WOMEN PO), CRANBERRY PO, ezetimibe, omeprazole, and atenolol. We will continue to administer denosumab.   No orders of the defined types were placed in this encounter.  Patient's Medications  New Prescriptions   No medications on file  Previous Medications   ACETAMINOPHEN (TYLENOL) 325 MG TABLET    Take 650 mg by mouth 2 (two) times daily. May take up to four  Per day/ 2 in the morning and 2 in the afternoon   ATENOLOL (TENORMIN) 25 MG TABLET    Take  1 tablet (25 mg total) by mouth daily.   CALCIUM CITRATE-VITAMIN D (CITRACAL+D) 315-200 MG-UNIT PER TABLET    Take 2 tablets by mouth daily.   CHOLECALCIFEROL (VITAMIN D3) 1000 UNITS CAPS    Take 1 capsule by mouth daily.   CRANBERRY PO    Take 1 capsule by mouth daily.    EZETIMIBE (ZETIA) 10 MG TABLET    TAKE 1 TABLET DAILY   MULTIPLE VITAMINS-MINERALS (CENTRUM SILVER 50+WOMEN PO)    1 tablet by mouth every day   OMEPRAZOLE (PRILOSEC) 20 MG CAPSULE    Take 1 capsule (20 mg total) by mouth daily.   PROBIOTIC PRODUCT (ALIGN PO)    Take by mouth daily.  Modified Medications   No medications on file  Discontinued Medications   No medications on file   ----------------------------------------------------------------------------------------------------------------------  Follow-up: Return in about 1 month (around 02/24/2018) for procedure.    Molli Barrows,  MD

## 2018-02-03 ENCOUNTER — Other Ambulatory Visit: Payer: Self-pay | Admitting: Internal Medicine

## 2018-02-18 ENCOUNTER — Encounter (INDEPENDENT_AMBULATORY_CARE_PROVIDER_SITE_OTHER): Payer: Self-pay | Admitting: Vascular Surgery

## 2018-02-18 ENCOUNTER — Ambulatory Visit (INDEPENDENT_AMBULATORY_CARE_PROVIDER_SITE_OTHER): Payer: Medicare Other | Admitting: Vascular Surgery

## 2018-02-18 VITALS — BP 128/75 | HR 69 | Resp 13 | Ht 66.0 in | Wt 148.0 lb

## 2018-02-18 DIAGNOSIS — I878 Other specified disorders of veins: Secondary | ICD-10-CM

## 2018-02-18 DIAGNOSIS — I83893 Varicose veins of bilateral lower extremities with other complications: Secondary | ICD-10-CM | POA: Insufficient documentation

## 2018-02-18 NOTE — Progress Notes (Signed)
Subjective:    Patient ID: Brenda Cardenas, female    DOB: Aug 14, 1932, 84 y.o.   MRN: [id number] Chief Complaint  Patient presents with  . Follow-up    1 year no studies   Patient presents for a yearly venous insufficiency / varicose vein follow-up.  The patient continues to engage in conservative therapy including wearing medical grade 1 compression socks and elevating her legs.  This seems to control her symptoms.  The patient does note one larger varicosity which runs down the back of her left leg.  The patient states that this is not painful, hard or red.  Patient denies any ulceration to the lower extremity.  Patient denies any asymptomatic lower extremity edema.  Patient denies any fever, nausea or vomiting.  Review of Systems  Constitutional: Negative.   HENT: Negative.   Eyes: Negative.   Respiratory: Negative.   Cardiovascular:       Venous insufficiency Varicose veins  Gastrointestinal: Negative.   Endocrine: Negative.   Genitourinary: Negative.   Musculoskeletal: Negative.   Skin: Negative.   Allergic/Immunologic: Negative.   Neurological: Negative.   Hematological: Negative.   Psychiatric/Behavioral: Negative.       Objective:   Physical Exam  Constitutional: She is oriented to person, place, and time. She appears well-developed and well-nourished. No distress.  HENT:  Head: Normocephalic and atraumatic.  Right Ear: External ear normal.  Left Ear: External ear normal.  Eyes: Pupils are equal, round, and reactive to light. Conjunctivae and EOM are normal.  Neck: Normal range of motion.  Cardiovascular: Normal rate, regular rhythm and normal heart sounds.  Pulmonary/Chest: Effort normal and breath sounds normal.  Musculoskeletal: Normal range of motion. She exhibits no edema.  Neurological: She is alert and oriented to person, place, and time.  Skin: Skin is warm and dry. She is not diaphoretic.  Greater than 1 cm and less than 1 cm diffuse varicosities noted to  the bilateral lower extremity.  The larger varicosity noted to the back of the left lower extremity is soft, nontender nonerythematous.  There are no ulcerations.  There is no cellulitis.  Psychiatric: She has a normal mood and affect. Her behavior is normal. Judgment and thought content normal.  Vitals reviewed.  BP 128/75 (BP Location: Right Arm, Patient Position: Sitting)   Pulse 69   Resp 13   Ht 5\' 6"  (1.676 m)   Wt 148 lb (67.1 kg)   BMI 23.89 kg/m   Past Medical History:  Diagnosis Date  . Arthritis   . Chronic kidney disease   . Colon polyps   . GERD (gastroesophageal reflux disease)   . Hyperlipidemia   . Hypertension    Social History   Socioeconomic History  . Marital status: Single    Spouse name: Not on file  . Number of children: Not on file  . Years of education: Not on file  . Highest education level: Not on file  Occupational History  . Not on file  Social Needs  . Financial resource strain: Not on file  . Food insecurity:    Worry: Not on file    Inability: Not on file  . Transportation needs:    Medical: Not on file    Non-medical: Not on file  Tobacco Use  . Smoking status: Never Smoker  . Smokeless tobacco: Never Used  Substance and Sexual Activity  . Alcohol use: No    Alcohol/week: 0.0 oz  . Drug use: No  . Sexual activity: Never  Lifestyle  . Physical activity:    Days per week: Not on file    Minutes per session: Not on file  . Stress: Not on file  Relationships  . Social connections:    Talks on phone: Not on file    Gets together: Not on file    Attends religious service: Not on file    Active member of club or organization: Not on file    Attends meetings of clubs or organizations: Not on file    Relationship status: Not on file  . Intimate partner violence:    Fear of current or ex partner: Not on file    Emotionally abused: Not on file    Physically abused: Not on file    Forced sexual activity: Not on file  Other Topics  Concern  . Not on file  Social History Narrative  . Not on file   Past Surgical History:  Procedure Laterality Date  . APPENDECTOMY  1959  . CHOLECYSTECTOMY  1995  . JOINT REPLACEMENT Right July 2009   [facility]   . TONSILLECTOMY AND ADENOIDECTOMY     Family History  Problem Relation Age of Onset  . Heart disease Mother   . Arthritis Mother   . Diabetes Father    Allergies  Allergen Reactions  . Ciprofloxacin Diarrhea  . Clindamycin/Lincomycin Diarrhea  . Sulfa Antibiotics Rash    Rash in throat  . Augmentin [Amoxicillin-Pot Clavulanate] Diarrhea  . Naproxen Sodium Other (See Comments)    Causes gastritis  . Tramadol Diarrhea    "upset stomach, headache, dizziness, drowsiness"  . Acetaminophen Rash    "upsets liver enzymes"  . Codeine Nausea Only and Rash    Upset stomach  . Doxycycline Nausea And Vomiting  . Doxycycline Hyclate Nausea And Vomiting and Nausea Only    Dry heaves. Dry heaves.  . E-Mycin [Erythromycin] Rash  . Lipitor [Atorvastatin] Other (See Comments) and Rash    "upsets liver enzymes" Upsets liver enzymes  . Macrobid [Nitrofurantoin] Rash    severe  . Nitrofurantoin Monohyd Macro Rash  . Nsaids Rash    Gastritis, GI ulceration. Gastritis, GI ulceration.  . Phenobarbital Other (See Comments), Rash and Anxiety    "got wild" Patient becomes very hyper and anxious  . Relafen [Nabumetone] Other (See Comments) and Rash    Liver enzymes. Liver enzymes. Upsets liver enzymes  . Statins Rash    Upsets liver enzymes  . Sulfacetamide Sodium Rash    Rash in throat  . Sulfasalazine Rash    Rash in throat Rash in throat  . Tramadol Hcl Nausea Only      Assessment & Plan:  Patient presents for a yearly venous insufficiency / varicose vein follow-up.  The patient continues to engage in conservative therapy including wearing medical grade 1 compression socks and elevating her legs.  This seems to control her symptoms.  The patient does note one larger  varicosity which runs down the back of her left leg.  The patient states that this is not painful, hard or red.  Patient denies any ulceration to the lower extremity.  Patient denies any asymptomatic lower extremity edema.  Patient denies any fever, nausea or vomiting.  1. Venous stasis of both lower extremities - Stable The patient's chronic venous insufficiency and varicose veins seem to be controlled through the use of conservative therapy including wearing medical grade 1 compression and engaging and elevation. The patient should continue this. If her symptoms change or she starts to  experience any pain, erythema or relation to her varicosities she should call her office sooner otherwise we will see her back for her annual yearly follow-up. The patient expresses her understanding  2. Varicose veins of bilateral lower extremities with other complications - Stable As above  Current Outpatient Medications on File Prior to Visit  Medication Sig Dispense Refill  . acetaminophen (TYLENOL) 325 MG tablet Take 650 mg by mouth 2 (two) times daily. May take up to four  Per day/ 2 in the morning and 2 in the afternoon    . atenolol (TENORMIN) 25 MG tablet Take 1 tablet (25 mg total) by mouth daily. 90 tablet 1  . calcium citrate-vitamin D (CITRACAL+D) 315-200 MG-UNIT per tablet Take 2 tablets by mouth daily.    . Cholecalciferol (VITAMIN D3) 1000 units CAPS Take 1 capsule by mouth daily.    Marland Kitchen CRANBERRY PO Take 1 capsule by mouth daily.     Marland Kitchen ezetimibe (ZETIA) 10 MG tablet TAKE 1 TABLET BY MOUTH DAILY 90 tablet 1  . Multiple Vitamins-Minerals (CENTRUM SILVER 50+WOMEN PO) 1 tablet by mouth every day    . omeprazole (PRILOSEC) 20 MG capsule Take 1 capsule (20 mg total) by mouth daily. 90 capsule 1  . Probiotic Product (ALIGN PO) Take by mouth daily.     Current Facility-Administered Medications on File Prior to Visit  Medication Dose Route Frequency Provider Last Rate Last Dose  . denosumab (PROLIA)  injection 60 mg  60 mg Subcutaneous Once Einar Pheasant, MD       There are no Patient Instructions on file for this visit. No follow-ups on file.  Daziah Hesler A Gitel Beste, PA-C

## 2018-02-28 ENCOUNTER — Encounter: Payer: Self-pay | Admitting: Anesthesiology

## 2018-02-28 ENCOUNTER — Other Ambulatory Visit: Payer: Self-pay | Admitting: Anesthesiology

## 2018-02-28 ENCOUNTER — Other Ambulatory Visit: Payer: Self-pay

## 2018-02-28 ENCOUNTER — Ambulatory Visit
Admission: RE | Admit: 2018-02-28 | Discharge: 2018-02-28 | Disposition: A | Discharge status: Home / Self Care | Payer: Medicare Other | Source: Ambulatory Visit | Attending: Anesthesiology | Admitting: Anesthesiology

## 2018-02-28 ENCOUNTER — Ambulatory Visit (HOSPITAL_BASED_OUTPATIENT_CLINIC_OR_DEPARTMENT_OTHER): Payer: Medicare Other | Admitting: Anesthesiology

## 2018-02-28 VITALS — BP 147/78 | HR 60 | Temp 98.1°F | Resp 18 | Ht 66.0 in | Wt 148.0 lb

## 2018-02-28 DIAGNOSIS — M51369 Other intervertebral disc degeneration, lumbar region without mention of lumbar back pain or lower extremity pain: Secondary | ICD-10-CM

## 2018-02-28 DIAGNOSIS — M48061 Spinal stenosis, lumbar region without neurogenic claudication: Secondary | ICD-10-CM | POA: Diagnosis not present

## 2018-02-28 DIAGNOSIS — M5441 Lumbago with sciatica, right side: Secondary | ICD-10-CM

## 2018-02-28 DIAGNOSIS — R52 Pain, unspecified: Secondary | ICD-10-CM

## 2018-02-28 DIAGNOSIS — M5136 Other intervertebral disc degeneration, lumbar region: Secondary | ICD-10-CM

## 2018-02-28 DIAGNOSIS — M4125 Other idiopathic scoliosis, thoracolumbar region: Secondary | ICD-10-CM | POA: Diagnosis not present

## 2018-02-28 DIAGNOSIS — M545 Low back pain, unspecified: Secondary | ICD-10-CM

## 2018-02-28 DIAGNOSIS — G8929 Other chronic pain: Secondary | ICD-10-CM

## 2018-02-28 MED ORDER — SODIUM CHLORIDE 0.9% FLUSH
10.0000 mL | Freq: Once | INTRAVENOUS | Status: AC
  Administered 2018-02-28: 10 mL

## 2018-02-28 MED ORDER — LACTATED RINGERS IV SOLN: 1000.0000 mL | INTRAVENOUS | Status: DC

## 2018-02-28 MED ORDER — DEXAMETHASONE SODIUM PHOSPHATE 10 MG/ML IJ SOLN
4.0000 mg | Freq: Once | INTRAMUSCULAR | Status: AC
  Administered 2018-02-28: 4 mg
  Filled 2018-02-28: qty 1

## 2018-02-28 MED ORDER — MIDAZOLAM HCL 5 MG/5ML IJ SOLN: 5.0000 mg | Freq: Once | INTRAMUSCULAR | Status: DC

## 2018-02-28 MED ORDER — LIDOCAINE HCL (PF) 1 % IJ SOLN
5.0000 mL | Freq: Once | INTRAMUSCULAR | Status: AC
  Administered 2018-02-28: 5 mL via SUBCUTANEOUS
  Filled 2018-02-28: qty 5

## 2018-02-28 MED ORDER — ROPIVACAINE HCL 2 MG/ML IJ SOLN
10.0000 mL | Freq: Once | INTRAMUSCULAR | Status: AC
  Administered 2018-02-28: 10 mL via EPIDURAL
  Filled 2018-02-28: qty 10

## 2018-02-28 MED ORDER — IOPAMIDOL (ISOVUE-M 200) INJECTION 41%
20.0000 mL | Freq: Once | INTRAMUSCULAR | Status: DC | PRN
  Administered 2018-02-28: 10 mL
  Filled 2018-02-28: qty 20

## 2018-02-28 MED ORDER — TRIAMCINOLONE ACETONIDE 40 MG/ML IJ SUSP
40.0000 mg | Freq: Once | INTRAMUSCULAR | Status: AC
  Administered 2018-02-28: 40 mg
  Filled 2018-02-28: qty 1

## 2018-02-28 NOTE — Progress Notes (Signed)
Safety precautions to be maintained throughout the outpatient stay will include: orient to surroundings, keep bed in low position, maintain call bell within reach at all times, provide assistance with transfer out of bed and ambulation.  

## 2018-02-28 NOTE — Patient Instructions (Signed)

## 2018-03-01 ENCOUNTER — Telehealth: Payer: Self-pay | Admitting: *Deleted

## 2018-03-01 ENCOUNTER — Encounter: Payer: Self-pay | Admitting: Anesthesiology

## 2018-03-01 NOTE — Progress Notes (Signed)
Subjective:  Patient ID: Brenda Cardenas, female    DOB: 1932/08/29  Age: 84 y.o. MRN: [id number]  CC: Back Pain (lower)   Procedure: L5-S1 epidural steroid under fluoroscopic guidance with no sedation and right L1 trigger point injection  HPI Brenda Cardenas presents for reevaluation.  She was last seen in April and presents today for injection therapy.  The quality characteristic distribution of her low back pain is as previously documented and primarily involves the right hip right posterior buttock region.  She is also having some spasming pain in the right lower back and some centralized right and left low back pain.  No significant changes in the quality characteristic or distribution of this pain are noted at this time.  She has tried conservative therapy with previous physical therapy and stretching exercises without significant improvement.  She continues to struggle with her gait and unfortunately has been unable to find any modalities giving her sustained relief for her low back pain.  In the past she has had epidural steroid injections with 50 to 75% relief lasting several weeks and secondary to the recent exacerbation of her pain she is requesting this today.  Otherwise she is in her usual state of health.  Outpatient Medications Prior to Visit  Medication Sig Dispense Refill  . acetaminophen (TYLENOL) 325 MG tablet Take 650 mg by mouth 2 (two) times daily. May take up to four  Per day/ 2 in the morning and 2 in the afternoon    . atenolol (TENORMIN) 25 MG tablet Take 1 tablet (25 mg total) by mouth daily. 90 tablet 1  . calcium citrate-vitamin D (CITRACAL+D) 315-200 MG-UNIT per tablet Take 2 tablets by mouth daily.    . Cholecalciferol (VITAMIN D3) 1000 units CAPS Take 1 capsule by mouth daily.    Marland Kitchen CRANBERRY PO Take 1 capsule by mouth daily.     Marland Kitchen ezetimibe (ZETIA) 10 MG tablet TAKE 1 TABLET BY MOUTH DAILY 90 tablet 1  . Multiple Vitamins-Minerals (CENTRUM SILVER 50+WOMEN PO) 1 tablet  by mouth every day    . omeprazole (PRILOSEC) 20 MG capsule Take 1 capsule (20 mg total) by mouth daily. 90 capsule 1  . Probiotic Product (ALIGN PO) Take by mouth daily.     Facility-Administered Medications Prior to Visit  Medication Dose Route Frequency Provider Last Rate Last Dose  . denosumab (PROLIA) injection 60 mg  60 mg Subcutaneous Once Einar Pheasant, MD        Review of Systems CNS: No confusion or sedation Cardiac: No angina or palpitations GI: No abdominal pain or constipation Constitutional: No nausea vomiting fevers or chills  Objective:  BP (!) 147/78   Pulse 60   Temp 98.1 F (36.7 C)   Resp 18   Ht 5\' 6"  (1.676 m)   Wt 148 lb (67.1 kg)   SpO2 100%   BMI 23.89 kg/m    BP Readings from Last 3 Encounters:  02/28/18 (!) 147/78  02/18/18 128/75  01/25/18 (!) 147/82     Wt Readings from Last 3 Encounters:  02/28/18 148 lb (67.1 kg)  02/18/18 148 lb (67.1 kg)  01/25/18 149 lb (67.6 kg)     Physical Exam Pt is alert and oriented PERRL EOMI HEART IS RRR no murmur or rub LCTA no wheezing or rales MUSCULOSKELETAL reveals a trigger point approximately L1 3 cm to the right of midline and some centralized low back pain at the L4 and L5 levels.  She has a significant rotary  scoliosis and generalized muscular pain of the low back.  She ambulates with an antalgic gait but her lower extremity strength and function appear at baseline.  Labs  Lab Results  Component Value Date   HGBA1C 5.5 06/16/2017   Lab Results  Component Value Date   MICROALBUR 1.6 06/16/2017   LDLCALC 114 (H) 12/15/2017   CREATININE 0.83 12/15/2017    -------------------------------------------------------------------------------------------------------------------- Lab Results  Component Value Date   WBC 8.6 12/15/2017   HGB 12.7 12/15/2017   HCT 38.1 12/15/2017   PLT 186.0 12/15/2017   GLUCOSE 119 (H) 12/15/2017   CHOL 189 12/15/2017   TRIG 115.0 12/15/2017   HDL 51.90  12/15/2017   LDLDIRECT 103.0 06/16/2017   LDLCALC 114 (H) 12/15/2017   ALT 16 12/15/2017   AST 21 12/15/2017   NA 143 12/15/2017   K 3.6 12/15/2017   CL 106 12/15/2017   CREATININE 0.83 12/15/2017   BUN 20 12/15/2017   CO2 25 12/15/2017   TSH 0.78 12/14/2016   HGBA1C 5.5 06/16/2017   MICROALBUR 1.6 06/16/2017    --------------------------------------------------------------------------------------------------------------------- Dg C-arm 1-60 Min-no Report  Result Date: 02/28/2018 Fluoroscopy was utilized by the requesting physician.  No radiographic interpretation.     Assessment & Plan:   Brenda Cardenas was seen today for back pain.  Diagnoses and all orders for this visit:  Other idiopathic scoliosis, thoracolumbar region  Acute bilateral low back pain with right-sided sciatica -     Lumbar Epidural Injection -     Lumbar Epidural Injection  Spinal stenosis of lumbar region without neurogenic claudication -     Lumbar Epidural Injection -     Lumbar Epidural Injection  DDD (degenerative disc disease), lumbar  Low back pain at multiple sites  Chronic right-sided low back pain without sciatica  Other orders -     triamcinolone acetonide (KENALOG-40) injection 40 mg -     sodium chloride flush (NS) 0.9 % injection 10 mL -     ropivacaine (PF) 2 mg/mL (0.2%) (NAROPIN) injection 10 mL -     midazolam (VERSED) 5 MG/5ML injection 5 mg -     lidocaine (PF) (XYLOCAINE) 1 % injection 5 mL -     lactated ringers infusion 1,000 mL -     iopamidol (ISOVUE-M) 41 % intrathecal injection 20 mL -     dexamethasone (DECADRON) injection 4 mg -     ropivacaine (PF) 2 mg/mL (0.2%) (NAROPIN) injection 10 mL        ----------------------------------------------------------------------------------------------------------------------  Problem List Items Addressed This Visit      Unprioritized   DDD (degenerative disc disease), lumbar   Relevant Medications   triamcinolone acetonide  (KENALOG-40) injection 40 mg (Completed)   dexamethasone (DECADRON) injection 4 mg (Completed)   Idiopathic scoliosis - Primary   Right-sided low back pain without sciatica   Relevant Medications   triamcinolone acetonide (KENALOG-40) injection 40 mg (Completed)   dexamethasone (DECADRON) injection 4 mg (Completed)   Spinal stenosis of lumbar region    Other Visit Diagnoses    Acute bilateral low back pain with right-sided sciatica       Relevant Medications   triamcinolone acetonide (KENALOG-40) injection 40 mg (Completed)   dexamethasone (DECADRON) injection 4 mg (Completed)   Low back pain at multiple sites       Relevant Medications   triamcinolone acetonide (KENALOG-40) injection 40 mg (Completed)   dexamethasone (DECADRON) injection 4 mg (Completed)        ----------------------------------------------------------------------------------------------------------------------  1. Acute bilateral  low back pain with right-sided sciatica Secondary to the recent exacerbation and her previous response to epidural steroid injections we will proceed with an L5-S1 epidural injection today.  The risks and benefits of been reviewed with her in full detail and all of her questions answered.  Return to clinic in approximately 2 to 3 months for repeat follow-up possible repeat injection at that time.  She averages approximately 3-4 epidural injections annually for the recalcitrant low back pain that unfortunately has failed to respond to more conservative therapy. - Lumbar Epidural Injection - Lumbar Epidural Injection  2. Spinal stenosis of lumbar region without neurogenic claudication As above - Lumbar Epidural Injection - Lumbar Epidural Injection  3. Other idiopathic scoliosis, thoracolumbar region As above  4. DDD (degenerative disc disease), lumbar As above  5. Low back pain at multiple sites We will proceed with a trigger point injection at L1 for her myofascial pain  syndrome.  6. Chronic right-sided low back pain without sciatica As above    ----------------------------------------------------------------------------------------------------------------------  I am having [name] maintain her calcium citrate-vitamin D, acetaminophen, Probiotic Product (ALIGN PO), Vitamin D3, Multiple Vitamins-Minerals (CENTRUM SILVER 50+WOMEN PO), CRANBERRY PO, omeprazole, atenolol, and ezetimibe. We administered triamcinolone acetonide, sodium chloride flush, ropivacaine (PF) 2 mg/mL (0.2%), lidocaine (PF), iopamidol, dexamethasone, and ropivacaine (PF) 2 mg/mL (0.2%). We will continue to administer denosumab.   Meds ordered this encounter  Medications  . triamcinolone acetonide (KENALOG-40) injection 40 mg  . sodium chloride flush (NS) 0.9 % injection 10 mL  . ropivacaine (PF) 2 mg/mL (0.2%) (NAROPIN) injection 10 mL  . midazolam (VERSED) 5 MG/5ML injection 5 mg  . lidocaine (PF) (XYLOCAINE) 1 % injection 5 mL  . lactated ringers infusion 1,000 mL  . iopamidol (ISOVUE-M) 41 % intrathecal injection 20 mL  . dexamethasone (DECADRON) injection 4 mg  . ropivacaine (PF) 2 mg/mL (0.2%) (NAROPIN) injection 10 mL   Patient's Medications  New Prescriptions   No medications on file  Previous Medications   ACETAMINOPHEN (TYLENOL) 325 MG TABLET    Take 650 mg by mouth 2 (two) times daily. May take up to four  Per day/ 2 in the morning and 2 in the afternoon   ATENOLOL (TENORMIN) 25 MG TABLET    Take 1 tablet (25 mg total) by mouth daily.   CALCIUM CITRATE-VITAMIN D (CITRACAL+D) 315-200 MG-UNIT PER TABLET    Take 2 tablets by mouth daily.   CHOLECALCIFEROL (VITAMIN D3) 1000 UNITS CAPS    Take 1 capsule by mouth daily.   CRANBERRY PO    Take 1 capsule by mouth daily.    EZETIMIBE (ZETIA) 10 MG TABLET    TAKE 1 TABLET BY MOUTH DAILY   MULTIPLE VITAMINS-MINERALS (CENTRUM SILVER 50+WOMEN PO)    1 tablet by mouth every day   OMEPRAZOLE (PRILOSEC) 20 MG CAPSULE    Take 1  capsule (20 mg total) by mouth daily.   PROBIOTIC PRODUCT (ALIGN PO)    Take by mouth daily.  Modified Medications   No medications on file  Discontinued Medications   No medications on file   ----------------------------------------------------------------------------------------------------------------------  Follow-up: Return in about 3 months (around 05/31/2018) for evaluation, procedure.  Procedure: L5-S1 epidural steroid under fluoroscopic guidance without sedation Procedure: L5-S1 LESI   NOTE: The risks, benefits, and expectations of the procedure have been discussed and explained to the patient who was understanding and in agreement with suggested treatment plan. No guarantees were made.  DESCRIPTION OF PROCEDURE: Lumbar epidural steroid injection with  no IV Versed, EKG, blood pressure, pulse, and pulse oximetry monitoring. The procedure was performed with the patient in the prone position under fluoroscopic guidance.  Sterile prep x3 was initiated and I then injected subcutaneous lidocaine to the overlying L5-S1 site after its fluoroscopic identifictation.  Using strict aseptic technique, I then advanced an 18-gauge Tuohy epidural needle in the midline using interlaminar approach via loss-of-resistance to saline technique. There was negative aspiration for heme or  CSF.  I then confirmed position with both AP and Lateral fluoroscan.  2 cc of Isovue were injected and a  total of 5 mL of Preservative-Free normal saline mixed with 40 mg of Kenalog and 1cc Ropicaine 0.2 percent were injected incrementally via the  epidurally placed needle. The needle was removed. The patient tolerated the injection well and was convalesced and discharged to home in stable condition. Should the patient have any post procedure difficulty they have been instructed on how to contact us for assistance.    Trigger point injection: The area overlying the aforementioned trigger points were prepped with alcohol. They  were then injected with a 25-gauge needle with 4 cc of ropivacaine 0.2% and Decadron 4 mg at each site after negative aspiration for heme. This was performed after informed consent was obtained and risks and benefits reviewed. She tolerated this procedure without difficulty and was convalesced and discharged to home in stable condition for follow-up as mentioned.  @Conni Knighton, MD@  Molli Barrows, MD

## 2018-03-01 NOTE — Telephone Encounter (Signed)
Attempted to call for post procedure follow-up. No answer, unable to leave a message. 

## 2018-03-04 LAB — HM MAMMOGRAPHY

## 2018-03-24 ENCOUNTER — Encounter: Payer: Self-pay | Admitting: Internal Medicine

## 2018-03-24 ENCOUNTER — Ambulatory Visit (INDEPENDENT_AMBULATORY_CARE_PROVIDER_SITE_OTHER): Payer: Medicare Other | Admitting: Internal Medicine

## 2018-03-24 VITALS — BP 110/70 | HR 58 | Temp 97.8°F | Resp 14 | Ht 66.0 in | Wt 148.0 lb

## 2018-03-24 DIAGNOSIS — R7301 Impaired fasting glucose: Secondary | ICD-10-CM | POA: Diagnosis not present

## 2018-03-24 DIAGNOSIS — I10 Essential (primary) hypertension: Secondary | ICD-10-CM | POA: Diagnosis not present

## 2018-03-24 DIAGNOSIS — Z23 Encounter for immunization: Secondary | ICD-10-CM | POA: Diagnosis not present

## 2018-03-24 DIAGNOSIS — M48061 Spinal stenosis, lumbar region without neurogenic claudication: Secondary | ICD-10-CM

## 2018-03-24 DIAGNOSIS — M810 Age-related osteoporosis without current pathological fracture: Secondary | ICD-10-CM

## 2018-03-24 DIAGNOSIS — K5909 Other constipation: Secondary | ICD-10-CM

## 2018-03-24 LAB — HEMOGLOBIN A1C: HEMOGLOBIN A1C: 5.7 % (ref 4.6–6.5)

## 2018-03-24 LAB — COMPREHENSIVE METABOLIC PANEL
ALBUMIN: 3.8 g/dL (ref 3.5–5.2)
ALK PHOS: 56 U/L (ref 39–117)
ALT: 12 U/L (ref 0–35)
AST: 17 U/L (ref 0–37)
BILIRUBIN TOTAL: 0.7 mg/dL (ref 0.2–1.2)
BUN: 21 mg/dL (ref 6–23)
CO2: 27 mEq/L (ref 19–32)
Calcium: 9.2 mg/dL (ref 8.4–10.5)
Chloride: 104 mEq/L (ref 96–112)
Creatinine, Ser: 0.76 mg/dL (ref 0.40–1.20)
GFR: 77.01 mL/min (ref >60.00)
GLUCOSE: 95 mg/dL (ref 70–99)
Potassium: 4.2 mEq/L (ref 3.5–5.1)
Sodium: 138 mEq/L (ref 135–145)
TOTAL PROTEIN: 6.2 g/dL (ref 6.0–8.3)

## 2018-03-24 NOTE — Progress Notes (Signed)
Subjective:  Patient ID: Brenda Cardenas, female    DOB: 1932-08-12  Age: 84 y.o. MRN: [id number]  CC: The primary encounter diagnosis was Impaired fasting glucose. Diagnoses of Spinal stenosis of lumbar region without neurogenic claudication, Other constipation, Essential hypertension, benign, and Age related osteoporosis, unspecified pathological fracture presence were also pertinent to this visit.  HPI [name] presents for FOLLOW UP ON CHRONIC ISSUES INCLUDInG BAck pain  DUE TO SPINAL STENOSIS .  Had a fall onto right hip while waiting in line at the bank.  another customer accidentally knocked her over.  happened several weeks ago.  She is walking without difficulty but notes that her hip is still sore.  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  Nehemiah Massed.    She has had a New living will  notarizedm along with healthcare POA updated , copes brought in today,    Taking miralax and high fiber diet for chronic constipation .  Seen by [facility] for  altered bowel habits .  Weight loss of 16 lbs over the last year.  Her weight  has plateaued . Eating  3 meals daily .  Good appetite but gets full quickly   [employer] for chronic back pain which is non operable and due to spinal stenosis secondary to  Scoliosis.  Had an ESI and a trigger point injection  For L4-5 and L1-L2 .  Pain compltely relieved for 12 days.    Using tylenol daly in suboptimal doses.  Left foot brace aggravates her back pain   Seeing a new urologist at [facility] female (changing from [facility])    Outpatient Medications Prior to Visit  Medication Sig Dispense Refill  . acetaminophen (TYLENOL) 325 MG tablet Take 650 mg by mouth 2 (two) times daily. May take up to four  Per day/ 2 in the morning and 2 in the afternoon    . atenolol (TENORMIN) 25 MG tablet Take  1 tablet (25 mg total) by mouth daily. 90 tablet 1  . calcium citrate-vitamin D (CITRACAL+D) 315-200 MG-UNIT per tablet Take 2 tablets by mouth daily.    . Cholecalciferol (VITAMIN D3) 1000 units CAPS Take 1 capsule by mouth daily.    Marland Kitchen CRANBERRY PO Take 1 capsule by mouth daily.     Marland Kitchen ezetimibe (ZETIA) 10 MG tablet TAKE 1 TABLET BY MOUTH DAILY 90 tablet 1  . Multiple Vitamins-Minerals (CENTRUM SILVER 50+WOMEN PO) 1 tablet by mouth every day    . mupirocin ointment (BACTROBAN) 2 %     . omeprazole (PRILOSEC) 20 MG capsule Take 1 capsule (20 mg total) by mouth daily. 90 capsule 1  . Probiotic Product (ALIGN PO) Take by mouth daily.     Facility-Administered Medications Prior to Visit  Medication Dose Route Frequency Provider Last Rate Last Dose  . denosumab (PROLIA) injection 60 mg  60 mg Subcutaneous Once Einar Pheasant, MD        Review of Systems;  Patient denies headache, fevers, malaise, unintentional weight loss, skin rash, eye pain, sinus congestion and sinus pain, sore throat, dysphagia,  hemoptysis , cough, dyspnea, wheezing, chest pain, palpitations, orthopnea, edema, abdominal pain, nausea, melena, diarrhea, constipation, flank pain, dysuria, hematuria, urinary  Frequency, nocturia, numbness, tingling, seizures,  Focal weakness, Loss of consciousness,  Tremor, insomnia, depression, anxiety, and suicidal ideation.  Objective:  BP 110/70 (BP Location: Left Arm, Patient Position: Sitting, Cuff Size: Normal)   Pulse (!) 58   Temp 97.8 F (36.6 C) (Oral)   Resp 14   Ht 5\' 6"  (1.676 m)   Wt 148 lb (67.1 kg)   SpO2 98%   BMI 23.89 kg/m   BP Readings from Last 3 Encounters:  03/24/18 110/70  02/28/18 (!) 147/78  02/18/18 128/75    Wt Readings from Last 3 Encounters:  03/24/18 148 lb (67.1 kg)  02/28/18 148 lb (67.1 kg)  02/18/18 148 lb (67.1 kg)    General appearance: alert, cooperative and appears stated age Ears: normal TM's and external ear canals both  ears Throat: lips, mucosa, and tongue normal; teeth and gums normal Neck: no adenopathy, no carotid bruit, supple, symmetrical, trachea midline and thyroid not enlarged, symmetric, no tenderness/mass/nodules Back: symmetric, no curvature. ROM normal. No CVA tenderness. Lungs: clear to auscultation bilaterally Heart: regular rate and rhythm, S1, S2 normal, no murmur, click, rub or gallop Abdomen: soft, non-tender; bowel sounds normal; no masses,  no organomegaly Pulses: 2+ and symmetric Skin: Skin color, texture, turgor normal. No rashes or lesions Lymph nodes: Cervical, supraclavicular, and axillary nodes normal.  Lab Results  Component Value Date   HGBA1C 5.7 03/24/2018   HGBA1C 5.5 06/16/2017    Lab Results  Component Value Date   CREATININE 0.76 03/24/2018   CREATININE 0.83 12/15/2017   CREATININE 0.90 11/01/2017    Lab Results  Component Value Date   WBC 8.6 12/15/2017   HGB 12.7 12/15/2017   HCT 38.1 12/15/2017   PLT 186.0 12/15/2017   GLUCOSE 95 03/24/2018   CHOL 189 12/15/2017   TRIG 115.0 12/15/2017   HDL 51.90 12/15/2017   LDLDIRECT 103.0 06/16/2017   LDLCALC 114 (H) 12/15/2017   ALT 12 03/24/2018   AST 17 03/24/2018   NA 138 03/24/2018   K 4.2 03/24/2018   CL 104 03/24/2018   CREATININE 0.76 03/24/2018   BUN 21 03/24/2018   CO2 27 03/24/2018   TSH 0.78 12/14/2016   HGBA1C 5.7 03/24/2018   MICROALBUR 1.6 06/16/2017    Dg C-arm 1-60 Min-no Report  Result Date: 02/28/2018 Fluoroscopy was utilized by the requesting physician.  No radiographic interpretation.    Assessment & Plan:   Problem List Items Addressed This Visit    Osteoporosis    With  Prior Lumbar compression  fracture.  Continue Prolia, last injection December 15 2017  .Reviewed use of calcium and Vit D supplements.  Continue Prolia.  Last dexa in 2017 showed improved T scores compared to 2014 [facility])       Spinal stenosis of lumbar region    Causing chronic pain, managed with ESI  and trigger point injectiosn prn by Dr Andree Elk.  Aggravated by use of her left foot bracdShe is encouraged to increase her use of tylenol to 2000 mg daily .  She is IADL      Essential hypertension, benign    Well controlled on current regimen. Renal function stable. No changes today   Lab Results  Component Value Date   CREATININE 0.76 03/24/2018   Lab Results  Component Value Date   NA 138 03/24/2018   K 4.2 03/24/2018   CL 104 03/24/2018   CO2 27 03/24/2018         Constipation    Managed with daily stool softeneres and BFL.  No changes today        Other Visit Diagnoses  Impaired fasting glucose    -  Primary   Relevant Orders   Comprehensive metabolic panel (Completed)   Hemoglobin A1c (Completed)    A total of 25 minutes of face to face time was spent with patient more than half of which was spent in reviewing her recent events and counselling about the above mentioned conditions  and coordination of care  I am having [name] maintain her calcium citrate-vitamin D, acetaminophen, Probiotic Product (ALIGN PO), Vitamin D3, Multiple Vitamins-Minerals (CENTRUM SILVER 50+WOMEN PO), CRANBERRY PO, omeprazole, atenolol, ezetimibe, and mupirocin ointment. We will continue to administer denosumab.  No orders of the defined types were placed in this encounter.   There are no discontinued medications.  Follow-up: Return in about 6 months (around 09/23/2018).   Crecencio Mc, MD

## 2018-03-24 NOTE — Patient Instructions (Signed)
You are doing well!  Your weight is perfect  Eating smaller more frequent meals may help you maintain this weight  You can use up to 2000 mg of tylenol daily to manage your arthritis pain

## 2018-03-26 NOTE — Assessment & Plan Note (Signed)
Well controlled on current regimen. Renal function stable. No changes today   Lab Results  Component Value Date   CREATININE 0.76 03/24/2018   Lab Results  Component Value Date   NA 138 03/24/2018   K 4.2 03/24/2018   CL 104 03/24/2018   CO2 27 03/24/2018

## 2018-03-26 NOTE — Assessment & Plan Note (Signed)
With  Prior Lumbar compression  fracture.  Continue Prolia, last injection December 15 2017  .Reviewed use of calcium and Vit D supplements.  Continue Prolia.  Last dexa in 2017 showed improved T scores compared to 2014 [facility])

## 2018-03-26 NOTE — Assessment & Plan Note (Signed)
Causing chronic pain, managed with ESI and trigger point injectiosn prn by Dr Andree Elk.  Aggravated by use of her left foot bracdShe is encouraged to increase her use of tylenol to 2000 mg daily .  She is IADL

## 2018-03-26 NOTE — Assessment & Plan Note (Signed)
Managed with daily stool softeneres and BFL.  No changes today

## 2018-03-29 ENCOUNTER — Encounter: Payer: Self-pay | Admitting: *Deleted

## 2018-04-07 ENCOUNTER — Encounter: Payer: Self-pay | Admitting: Internal Medicine

## 2018-04-07 ENCOUNTER — Encounter: Admission: RE | Payer: Self-pay | Source: Ambulatory Visit

## 2018-04-07 ENCOUNTER — Ambulatory Visit: Payer: Medicare Other | Admitting: Podiatry

## 2018-04-07 ENCOUNTER — Ambulatory Visit: Admission: RE | Admit: 2018-04-07 | Payer: Medicare Other | Source: Ambulatory Visit | Admitting: Ophthalmology

## 2018-04-07 HISTORY — DX: Gastritis, unspecified, without bleeding: K29.70

## 2018-04-07 HISTORY — DX: Diaphragmatic hernia without obstruction or gangrene: K44.9

## 2018-04-07 HISTORY — DX: Irritable bowel syndrome, unspecified: K58.9

## 2018-04-07 SURGERY — PHACOEMULSIFICATION, CATARACT, WITH IOL INSERTION
Anesthesia: Choice | Laterality: Right

## 2018-04-12 ENCOUNTER — Other Ambulatory Visit: Payer: Self-pay

## 2018-04-12 ENCOUNTER — Encounter: Payer: Self-pay | Admitting: *Deleted

## 2018-04-13 NOTE — Discharge Instructions (Signed)

## 2018-04-20 ENCOUNTER — Ambulatory Visit: Payer: Medicare Other | Admitting: Anesthesiology

## 2018-04-20 ENCOUNTER — Ambulatory Visit
Admission: RE | Admit: 2018-04-20 | Discharge: 2018-04-20 | Disposition: A | Discharge status: Home / Self Care | Payer: Medicare Other | Source: Ambulatory Visit | Attending: Ophthalmology | Admitting: Ophthalmology

## 2018-04-20 ENCOUNTER — Encounter
Admission: RE | Disposition: A | Discharge status: Home / Self Care | Payer: Self-pay | Source: Ambulatory Visit | Attending: Ophthalmology

## 2018-04-20 DIAGNOSIS — Z885 Allergy status to narcotic agent status: Secondary | ICD-10-CM | POA: Diagnosis not present

## 2018-04-20 DIAGNOSIS — Z96651 Presence of right artificial knee joint: Secondary | ICD-10-CM | POA: Insufficient documentation

## 2018-04-20 DIAGNOSIS — Z886 Allergy status to analgesic agent status: Secondary | ICD-10-CM | POA: Diagnosis not present

## 2018-04-20 DIAGNOSIS — Z881 Allergy status to other antibiotic agents status: Secondary | ICD-10-CM | POA: Diagnosis not present

## 2018-04-20 DIAGNOSIS — I10 Essential (primary) hypertension: Secondary | ICD-10-CM | POA: Diagnosis not present

## 2018-04-20 DIAGNOSIS — E7800 Pure hypercholesterolemia, unspecified: Secondary | ICD-10-CM | POA: Insufficient documentation

## 2018-04-20 DIAGNOSIS — H2511 Age-related nuclear cataract, right eye: Secondary | ICD-10-CM | POA: Diagnosis not present

## 2018-04-20 DIAGNOSIS — Z882 Allergy status to sulfonamides status: Secondary | ICD-10-CM | POA: Diagnosis not present

## 2018-04-20 DIAGNOSIS — Z79899 Other long term (current) drug therapy: Secondary | ICD-10-CM | POA: Insufficient documentation

## 2018-04-20 DIAGNOSIS — M1990 Unspecified osteoarthritis, unspecified site: Secondary | ICD-10-CM | POA: Diagnosis not present

## 2018-04-20 DIAGNOSIS — Z888 Allergy status to other drugs, medicaments and biological substances status: Secondary | ICD-10-CM | POA: Insufficient documentation

## 2018-04-20 DIAGNOSIS — K219 Gastro-esophageal reflux disease without esophagitis: Secondary | ICD-10-CM | POA: Diagnosis not present

## 2018-04-20 HISTORY — PX: CATARACT EXTRACTION W/PHACO: SHX586

## 2018-04-20 SURGERY — PHACOEMULSIFICATION, CATARACT, WITH IOL INSERTION
Anesthesia: Monitor Anesthesia Care | Site: Eye | Laterality: Right | Wound class: Clean

## 2018-04-20 MED ORDER — MIDAZOLAM HCL 2 MG/2ML IJ SOLN
INTRAMUSCULAR | Status: DC | PRN
  Administered 2018-04-20: 0.5 mg via INTRAVENOUS

## 2018-04-20 MED ORDER — FENTANYL CITRATE (PF) 100 MCG/2ML IJ SOLN
INTRAMUSCULAR | Status: DC | PRN
  Administered 2018-04-20: 50 ug via INTRAVENOUS

## 2018-04-20 MED ORDER — NA HYALUR & NA CHOND-NA HYALUR 0.4-0.35 ML IO KIT
PACK | INTRAOCULAR | Status: DC | PRN
Start: 2018-04-20 — End: 2018-04-20
  Administered 2018-04-20: 1 mL via INTRAOCULAR

## 2018-04-20 MED ORDER — BRIMONIDINE TARTRATE-TIMOLOL 0.2-0.5 % OP SOLN
OPHTHALMIC | Status: DC | PRN
  Administered 2018-04-20: 1 [drp] via OPHTHALMIC

## 2018-04-20 MED ORDER — PHENYLEPHRINE HCL 10 % OP SOLN
1.0000 [drp] | OPHTHALMIC | Status: DC | PRN
  Administered 2018-04-20 (×3): 1 [drp] via OPHTHALMIC

## 2018-04-20 MED ORDER — BSS IO SOLN
INTRAOCULAR | Status: DC | PRN
  Administered 2018-04-20: 66 mL via OPHTHALMIC

## 2018-04-20 MED ORDER — CYCLOPENTOLATE HCL 2 % OP SOLN
1.0000 [drp] | OPHTHALMIC | Status: DC | PRN
  Administered 2018-04-20 (×3): 1 [drp] via OPHTHALMIC

## 2018-04-20 MED ORDER — TETRACAINE HCL 0.5 % OP SOLN
1.0000 [drp] | OPHTHALMIC | Status: DC | PRN
  Administered 2018-04-20 (×2): 1 [drp] via OPHTHALMIC

## 2018-04-20 MED ORDER — LACTATED RINGERS IV SOLN: INTRAVENOUS | Status: DC

## 2018-04-20 MED ORDER — CEFUROXIME OPHTHALMIC INJECTION 1 MG/0.1 ML
INJECTION | OPHTHALMIC | Status: DC | PRN
  Administered 2018-04-20: .3 mL via OPHTHALMIC

## 2018-04-20 MED ORDER — POLYMYXIN B-TRIMETHOPRIM 10000-0.1 UNIT/ML-% OP SOLN
1.0000 [drp] | OPHTHALMIC | Status: DC | PRN
Start: 2018-04-20 — End: 2018-04-20
  Administered 2018-04-20 (×3): 1 [drp] via OPHTHALMIC

## 2018-04-20 MED ORDER — LIDOCAINE HCL (PF) 2 % IJ SOLN
INTRAOCULAR | Status: DC | PRN
  Administered 2018-04-20: 1 mL via INTRAMUSCULAR

## 2018-04-20 SURGICAL SUPPLY — 25 items
CANNULA ANT/CHMB 27GA (MISCELLANEOUS) ×3 IMPLANT
CARTRIDGE ABBOTT (MISCELLANEOUS) IMPLANT
GLOVE SURG LX 7.5 STRW (GLOVE) ×2
GLOVE SURG LX STRL 7.5 STRW (GLOVE) ×1 IMPLANT
GLOVE SURG TRIUMPH 8.0 PF LTX (GLOVE) ×3 IMPLANT
GOWN STRL REUS W/ TWL LRG LVL3 (GOWN DISPOSABLE) ×2 IMPLANT
GOWN STRL REUS W/TWL LRG LVL3 (GOWN DISPOSABLE) ×4
LENS IOL TECNIS ITEC 19.0 (Intraocular Lens) ×3 IMPLANT
MARKER SKIN DUAL TIP RULER LAB (MISCELLANEOUS) ×3 IMPLANT
NDL RETROBULBAR .5 NSTRL (NEEDLE) IMPLANT
NEEDLE FILTER BLUNT 18X 1/2SAF (NEEDLE) ×2
NEEDLE FILTER BLUNT 18X1 1/2 (NEEDLE) ×1 IMPLANT
PACK CATARACT BRASINGTON (MISCELLANEOUS) ×3 IMPLANT
PACK EYE AFTER SURG (MISCELLANEOUS) ×3 IMPLANT
PACK OPTHALMIC (MISCELLANEOUS) ×3 IMPLANT
RING MALYGIN 7.0 (MISCELLANEOUS) IMPLANT
SUT ETHILON 10-0 CS-B-6CS-B-6 (SUTURE)
SUT VICRYL  9 0 (SUTURE)
SUT VICRYL 9 0 (SUTURE) IMPLANT
SUTURE EHLN 10-0 CS-B-6CS-B-6 (SUTURE) IMPLANT
SYR 3ML LL SCALE MARK (SYRINGE) ×3 IMPLANT
SYR 5ML LL (SYRINGE) ×3 IMPLANT
SYR TB 1ML LUER SLIP (SYRINGE) ×3 IMPLANT
WATER STERILE IRR 500ML POUR (IV SOLUTION) ×3 IMPLANT
WIPE NON LINTING 3.25X3.25 (MISCELLANEOUS) ×3 IMPLANT

## 2018-04-20 NOTE — Op Note (Signed)
LOCATION:  [place]   PREOPERATIVE DIAGNOSIS:    Nuclear sclerotic cataract right eye. H25.11   POSTOPERATIVE DIAGNOSIS:  Nuclear sclerotic cataract right eye.     PROCEDURE:  Phacoemusification with posterior chamber intraocular lens placement of the right eye   LENS:   Implant Name Type Inv. Item Serial No. Manufacturer Lot No. LRB No. Used  LENS IOL DIOP 19.0 - [id number] Intraocular Lens LENS IOL DIOP 19.0 [id number]  Right 1        ULTRASOUND TIME: 12 % of 1 minutes, 8 seconds.  CDE 8.5   SURGEON:  Wyonia Hough, MD   ANESTHESIA:  Topical with tetracaine drops and 2% Xylocaine jelly, augmented with 1% preservative-free intracameral lidocaine.    COMPLICATIONS:  None.   DESCRIPTION OF PROCEDURE:  The patient was identified in the holding room and transported to the operating room and placed in the supine position under the operating microscope.  The right eye was identified as the operative eye and it was prepped and draped in the usual sterile ophthalmic fashion.   A 1 millimeter clear-corneal paracentesis was made at the 12:00 position.  0.5 ml of preservative-free 1% lidocaine was injected into the anterior chamber. The anterior chamber was filled with Viscoat viscoelastic.  A 2.4 millimeter keratome was used to make a near-clear corneal incision at the 9:00 position.  A curvilinear capsulorrhexis was made with a cystotome and capsulorrhexis forceps.  Balanced salt solution was used to hydrodissect and hydrodelineate the nucleus.   Phacoemulsification was then used in stop and chop fashion to remove the lens nucleus and epinucleus.  The remaining cortex was then removed using the irrigation and aspiration handpiece. Provisc was then placed into the capsular bag to distend it for lens placement.  A lens was then injected into the capsular bag.  The remaining viscoelastic was aspirated.   Wounds were hydrated with balanced salt solution.  The anterior  chamber was inflated to a physiologic pressure with balanced salt solution.  No wound leaks were noted. Cefuroxime 0.1 ml of a 10mg /ml solution was injected into the anterior chamber for a dose of 1 mg of intracameral antibiotic at the completion of the case.   Timolol and Brimonidine drops were applied to the eye.  The patient was taken to the recovery room in stable condition without complications of anesthesia or surgery.   Brenda Cardenas 04/20/2018, 12:00 PM

## 2018-04-20 NOTE — Anesthesia Postprocedure Evaluation (Signed)
Anesthesia Post Note  Patient: Brenda Cardenas  Procedure(s) Performed: CATARACT EXTRACTION PHACO AND INTRAOCULAR LENS PLACEMENT (IOC)  RIGHT (Right Eye)  Patient location during evaluation: PACU Anesthesia Type: MAC Level of consciousness: awake and alert Pain management: pain level controlled Vital Signs Assessment: post-procedure vital signs reviewed and stable Respiratory status: spontaneous breathing Cardiovascular status: blood pressure returned to baseline Postop Assessment: no headache Anesthetic complications: no    Jaci Standard, III,  [place] D

## 2018-04-20 NOTE — Anesthesia Preprocedure Evaluation (Signed)
Anesthesia Evaluation  Patient identified by MRN, date of birth, ID band Patient awake    Reviewed: Allergy & Precautions, H&P , NPO status , Patient's Chart, lab work & pertinent test results  History of Anesthesia Complications Negative for: history of anesthetic complications  Airway Mallampati: I  TM Distance: >3 FB Neck ROM: full    Dental no notable dental hx.    Pulmonary neg pulmonary ROS,    Pulmonary exam normal breath sounds clear to auscultation       Cardiovascular hypertension, On Medications Normal cardiovascular exam Rhythm:regular Rate:Normal     Neuro/Psych    GI/Hepatic Neg liver ROS, hiatal hernia, Medicated,  Endo/Other  negative endocrine ROS  Renal/GU   negative genitourinary   Musculoskeletal   Abdominal   Peds  Hematology   Anesthesia Other Findings   Reproductive/Obstetrics negative OB ROS                           Anesthesia Physical Anesthesia Plan  ASA: II  Anesthesia Plan: MAC   Post-op Pain Management:    Induction:   PONV Risk Score and Plan:   Airway Management Planned:   Additional Equipment:   Intra-op Plan:   Post-operative Plan:   Informed Consent: I have reviewed the patients History and Physical, chart, labs and discussed the procedure including the risks, benefits and alternatives for the proposed anesthesia with the patient or authorized representative who has indicated his/her understanding and acceptance.     Plan Discussed with:   Anesthesia Plan Comments:         Anesthesia Quick Evaluation

## 2018-04-20 NOTE — H&P (Signed)
The History and Physical notes are on paper, have been signed, and are to be scanned. The patient remains stable and unchanged from the H&P.   Previous H&P reviewed, patient examined, and there are no changes.  Brenda Cardenas 04/20/2018 12:08 PM

## 2018-04-20 NOTE — Transfer of Care (Signed)
Immediate Anesthesia Transfer of Care Note  Patient: Brenda Cardenas  Procedure(s) Performed: CATARACT EXTRACTION PHACO AND INTRAOCULAR LENS PLACEMENT (IOC)  RIGHT (Right Eye)  Patient Location: PACU  Anesthesia Type: MAC  Level of Consciousness: awake, alert  and patient cooperative  Airway and Oxygen Therapy: Patient Spontanous Breathing and Patient connected to supplemental oxygen  Post-op Assessment: Post-op Vital signs reviewed, Patient's Cardiovascular Status Stable, Respiratory Function Stable, Patent Airway and No signs of Nausea or vomiting  Post-op Vital Signs: Reviewed and stable  Complications: No apparent anesthesia complications

## 2018-04-20 NOTE — Anesthesia Procedure Notes (Signed)
Procedure Name: MAC Performed by: Yilin Weedon, CRNA Pre-anesthesia Checklist: Patient identified, Emergency Drugs available, Suction available, Timeout performed and Patient being monitored Patient Re-evaluated:Patient Re-evaluated prior to induction Oxygen Delivery Method: Nasal cannula Placement Confirmation: positive ETCO2       

## 2018-04-21 ENCOUNTER — Encounter: Payer: Self-pay | Admitting: Ophthalmology

## 2018-05-11 ENCOUNTER — Telehealth: Payer: Self-pay | Admitting: Internal Medicine

## 2018-05-11 DIAGNOSIS — E785 Hyperlipidemia, unspecified: Secondary | ICD-10-CM

## 2018-05-11 DIAGNOSIS — I10 Essential (primary) hypertension: Secondary | ICD-10-CM

## 2018-05-11 DIAGNOSIS — E559 Vitamin D deficiency, unspecified: Secondary | ICD-10-CM

## 2018-05-11 DIAGNOSIS — G4762 Sleep related leg cramps: Secondary | ICD-10-CM

## 2018-05-11 DIAGNOSIS — R7301 Impaired fasting glucose: Secondary | ICD-10-CM

## 2018-05-11 NOTE — Telephone Encounter (Signed)
Insurance verification for Prolia filed on [employer] Portal. 

## 2018-05-12 NOTE — Telephone Encounter (Signed)
PA filed with [employer] for [place] patient secondary insurance.

## 2018-05-17 NOTE — Telephone Encounter (Signed)
Fasting lab appointment scheduled.

## 2018-05-17 NOTE — Telephone Encounter (Signed)
FYI,  I prefer for all patients to start having labs done PRIOR to visit so I don't have to spend an extra hour per night sending results messages to patients .  Fasting labs ordered

## 2018-05-17 NOTE — Telephone Encounter (Addendum)
Approval received for Prolia, has an appointment with PCP on 06/20/18 at  Am patient will come fasting for labs unless PCP prefers her to come a few days prior to appointment for labs.

## 2018-05-17 NOTE — Addendum Note (Signed)
Addended by: Crecencio Mc on: 05/17/2018 09:49 AM   Modules accepted: Orders

## 2018-06-02 ENCOUNTER — Ambulatory Visit (INDEPENDENT_AMBULATORY_CARE_PROVIDER_SITE_OTHER): Payer: Medicare Other

## 2018-06-02 DIAGNOSIS — Z23 Encounter for immunization: Secondary | ICD-10-CM | POA: Diagnosis not present

## 2018-06-16 ENCOUNTER — Other Ambulatory Visit (INDEPENDENT_AMBULATORY_CARE_PROVIDER_SITE_OTHER): Payer: Medicare Other

## 2018-06-16 ENCOUNTER — Other Ambulatory Visit: Payer: Self-pay | Admitting: Internal Medicine

## 2018-06-16 DIAGNOSIS — G4762 Sleep related leg cramps: Secondary | ICD-10-CM

## 2018-06-16 DIAGNOSIS — E785 Hyperlipidemia, unspecified: Secondary | ICD-10-CM | POA: Diagnosis not present

## 2018-06-16 DIAGNOSIS — E559 Vitamin D deficiency, unspecified: Secondary | ICD-10-CM

## 2018-06-16 DIAGNOSIS — R7301 Impaired fasting glucose: Secondary | ICD-10-CM | POA: Diagnosis not present

## 2018-06-16 LAB — CBC WITH DIFFERENTIAL/PLATELET
BASOS ABS: 0.1 10*3/uL (ref 0.0–0.1)
Basophils Relative: 1 % (ref 0.0–3.0)
Eosinophils Absolute: 0.4 10*3/uL (ref 0.0–0.7)
Eosinophils Relative: 3.8 % (ref 0.0–5.0)
HCT: 38.2 % (ref 36.0–46.0)
Hemoglobin: 12.8 g/dL (ref 12.0–15.0)
Lymphocytes Relative: 25.6 % (ref 12.0–46.0)
Lymphs Abs: 2.8 10*3/uL (ref 0.7–4.0)
MCHC: 33.6 g/dL (ref 30.0–36.0)
MCV: 95.1 fl (ref 78.0–100.0)
MONOS PCT: 4.6 % (ref 3.0–12.0)
Monocytes Absolute: 0.5 10*3/uL (ref 0.1–1.0)
Neutro Abs: 7.2 10*3/uL (ref 1.4–7.7)
Neutrophils Relative %: 65 % (ref 43.0–77.0)
PLATELETS: 174 10*3/uL (ref 150.0–400.0)
RBC: 4.02 Mil/uL (ref 3.87–5.11)
RDW: 19 % — ABNORMAL HIGH (ref 11.5–15.5)
WBC: 11 10*3/uL — ABNORMAL HIGH (ref 4.0–10.5)

## 2018-06-16 LAB — COMPREHENSIVE METABOLIC PANEL
ALT: 12 U/L (ref 0–35)
AST: 16 U/L (ref 0–37)
Albumin: 4 g/dL (ref 3.5–5.2)
Alkaline Phosphatase: 68 U/L (ref 39–117)
BILIRUBIN TOTAL: 0.7 mg/dL (ref 0.2–1.2)
BUN: 24 mg/dL — ABNORMAL HIGH (ref 6–23)
CHLORIDE: 106 meq/L (ref 96–112)
CO2: 28 mEq/L (ref 19–32)
Calcium: 9.6 mg/dL (ref 8.4–10.5)
Creatinine, Ser: 0.79 mg/dL (ref 0.40–1.20)
GFR: 73.6 mL/min (ref >60.00)
GLUCOSE: 97 mg/dL (ref 70–99)
Potassium: 3.6 mEq/L (ref 3.5–5.1)
Sodium: 142 mEq/L (ref 135–145)
Total Protein: 6.7 g/dL (ref 6.0–8.3)

## 2018-06-16 LAB — LIPID PANEL
Cholesterol: 167 mg/dL (ref 0–200)
HDL: 49.5 mg/dL (ref >39.00)
LDL Cholesterol: 95 mg/dL (ref 0–99)
NONHDL: 117.59
Total CHOL/HDL Ratio: 3
Triglycerides: 111 mg/dL (ref 0.0–149.0)
VLDL: 22.2 mg/dL (ref 0.0–40.0)

## 2018-06-16 LAB — VITAMIN D 25 HYDROXY (VIT D DEFICIENCY, FRACTURES): VITD: 52.23 ng/mL (ref 30.00–100.00)

## 2018-06-16 LAB — HEMOGLOBIN A1C: HEMOGLOBIN A1C: 5.5 % (ref 4.6–6.5)

## 2018-06-16 NOTE — Telephone Encounter (Signed)
Last OV 03/24/2018   Last refilled

## 2018-06-20 ENCOUNTER — Ambulatory Visit (INDEPENDENT_AMBULATORY_CARE_PROVIDER_SITE_OTHER): Payer: Medicare Other | Admitting: Internal Medicine

## 2018-06-20 ENCOUNTER — Encounter: Payer: Self-pay | Admitting: Internal Medicine

## 2018-06-20 VITALS — BP 128/78 | HR 64 | Temp 97.9°F | Resp 15 | Ht 66.0 in | Wt 148.0 lb

## 2018-06-20 DIAGNOSIS — M5136 Other intervertebral disc degeneration, lumbar region: Secondary | ICD-10-CM

## 2018-06-20 DIAGNOSIS — I10 Essential (primary) hypertension: Secondary | ICD-10-CM

## 2018-06-20 DIAGNOSIS — M810 Age-related osteoporosis without current pathological fracture: Secondary | ICD-10-CM | POA: Diagnosis not present

## 2018-06-20 DIAGNOSIS — E782 Mixed hyperlipidemia: Secondary | ICD-10-CM | POA: Insufficient documentation

## 2018-06-20 DIAGNOSIS — E785 Hyperlipidemia, unspecified: Secondary | ICD-10-CM | POA: Diagnosis not present

## 2018-06-20 DIAGNOSIS — E559 Vitamin D deficiency, unspecified: Secondary | ICD-10-CM

## 2018-06-20 MED ORDER — DENOSUMAB 60 MG/ML ~~LOC~~ SOSY
60.0000 mg | PREFILLED_SYRINGE | Freq: Once | SUBCUTANEOUS | Status: AC
  Administered 2018-06-20: 60 mg via SUBCUTANEOUS

## 2018-06-20 MED ORDER — ZOSTER VAC RECOMB ADJUVANTED 50 MCG/0.5ML IM SUSR: 0.5000 mL | Freq: Once | INTRAMUSCULAR | 1 refills | Status: AC

## 2018-06-20 NOTE — Progress Notes (Signed)
Patient ID: Brenda Cardenas, female    DOB: 1932/01/09  Age: 84 y.o. MRN: [id number]  The patient is here for follow up and management of other chronic and acute problems.   The risk factors are reflected in the social history.  The roster of all physicians providing medical care to patient - is listed in the Snapshot section of the chart.  Activities of daily living:  The patient is 100% independent in all ADLs: dressing, toileting, feeding as well as independent mobility  Home safety : The patient has smoke detectors in the home. They wear seatbelts.  There are no firearms at home. There is no violence in the home.   There is no risks for hepatitis, STDs or HIV. There is no   history of blood transfusion. They have no travel history to infectious disease endemic areas of the world.  The patient has seen their dentist in the last six month. They have seen their eye doctor in the last year. They admit to slight hearing difficulty with regard to whispered voices and some television programs.  They have deferred audiologic testing in the last year.  They do not  have excessive sun exposure. Discussed the need for sun protection: hats, long sleeves and use of sunscreen if there is significant sun exposure.   Diet: the importance of a healthy diet is discussed. They do have a healthy diet.  The benefits of regular aerobic exercise were discussed. She walks 4 times per week ,  20 minutes.   Depression screen: there are no signs or vegative symptoms of depression- irritability, change in appetite, anhedonia, sadness/tearfullness.  Cognitive assessment: the patient manages all their financial and personal affairs and is actively engaged. They could relate day,date,year and events; recalled 2/3 objects at 3 minutes; performed clock-face test normally.  The following portions of the patient's history were reviewed and updated as appropriate: allergies, current medications, past family history, past  medical history,  past surgical history, past social history  and problem list.  Visual acuity was not assessed per patient preference since she has regular follow up with her ophthalmologist. Hearing and body mass index were assessed and reviewed.   During the course of the visit the patient was educated and counseled about appropriate screening and preventive services including : fall prevention , diabetes screening, nutrition counseling, colorectal cancer screening, and recommended immunizations.    CC: The primary encounter diagnosis was Age related osteoporosis, unspecified pathological fracture presence. Diagnoses of DDD (degenerative disc disease), lumbar, Essential hypertension, benign, Hyperlipidemia LDL goal <130, and Hypovitaminosis D were also pertinent to this visit.   1) recent episode of laryngitis  Due to PND.   Had fluroscopy sinus eval by Dawna Part of PND then vecame  much worse on Saturday and  Was treated by Urgent Care for  sinusitis   with ceforuxime and tessalon  2) corneal abrasion complicated  Her  eye surgery in July  3) tinnitus ,  Hearing loss,  Now wearing hearing aides bilaterally  . Having trouble keeping right one in due to new glasses pushing the ear cartilage forward .   4) managing constipation with miralax  5) back pain no longer helped by [facility]   b [place], last injected lasted only 12 day   6) Seeing  [facility] urology   Chrsty Borowski for chronic cystitis (recommended by Jacqlyn Larsen)     HPI:   Moet has a past medical history of Arthritis, Chronic kidney disease, Colon polyps, Gastritis, GERD (  gastroesophageal reflux disease), Hiatal hernia, Hyperlipidemia, Hypertension, and IBS (irritable bowel syndrome).   She has a past surgical history that includes Tonsillectomy and adenoidectomy; Joint replacement (Right, July 2009); Appendectomy (1959); Cholecystectomy (1995); Knee arthroscopy; Ganglion cyst excision; Vein ligation; Skin cancer excision; Carpal tunnel  release; Tonsillectomy; Salivary gland surgery; Cataract extraction w/ intraocular lens implant (Left); Lithotripsy; and Cataract extraction w/PHACO (Right, 04/20/2018).   Her family history includes Arthritis in her mother; Diabetes in her father; Heart disease in her mother.She reports that she has never smoked. She has never used smokeless tobacco. She reports that she does not drink alcohol or use drugs.  Outpatient Medications Prior to Visit  Medication Sig Dispense Refill  . acetaminophen (TYLENOL) 325 MG tablet Take 650 mg by mouth 2 (two) times daily.     Marland Kitchen atenolol (TENORMIN) 25 MG tablet TAKE ONE TABLET EVERY DAY 90 tablet 1  . calcium citrate-vitamin D (CITRACAL+D) 315-200 MG-UNIT per tablet Take 2 tablets by mouth daily.    . Cholecalciferol (VITAMIN D3) 2000 units TABS Take 2,000 Units by mouth daily.     Marland Kitchen CRANBERRY PO Take 2 capsules by mouth daily.     Marland Kitchen ezetimibe (ZETIA) 10 MG tablet TAKE 1 TABLET BY MOUTH DAILY 90 tablet 1  . Multiple Vitamins-Minerals (CENTRUM SILVER 50+WOMEN PO) 1 tablet by mouth every day    . mupirocin ointment (BACTROBAN) 2 %     . omeprazole (PRILOSEC) 20 MG capsule Take 1 capsule (20 mg total) by mouth daily. 90 capsule 1  . Probiotic Product (ALIGN PO) Take 1 tablet by mouth daily.      Facility-Administered Medications Prior to Visit  Medication Dose Route Frequency Provider Last Rate Last Dose  . denosumab (PROLIA) injection 60 mg  60 mg Subcutaneous Once Einar Pheasant, MD        Review of Systems   Patient denies headache, fevers, malaise, unintentional weight loss, skin rash, eye pain, sinus congestion and sinus pain, sore throat, dysphagia,  hemoptysis , cough, dyspnea, wheezing, chest pain, palpitations, orthopnea, edema, abdominal pain, nausea, melena, diarrhea, constipation, flank pain, dysuria, hematuria, urinary  Frequency, nocturia, numbness, tingling, seizures,  Focal weakness, Loss of consciousness,  Tremor, insomnia, depression,  anxiety, and suicidal ideation.      Objective:  BP 128/78 (BP Location: Left Arm, Patient Position: Sitting, Cuff Size: Normal)   Pulse 64   Temp 97.9 F (36.6 C) (Oral)   Resp 15   Ht 5\' 6"  (1.676 m)   Wt 148 lb (67.1 kg)   SpO2 99%   BMI 23.89 kg/m   Physical Exam   General appearance: alert, cooperative and appears stated age Head: Normocephalic, without obvious abnormality, atraumatic Eyes: conjunctivae/corneas clear. PERRL, EOM's intact. Fundi benign. Ears: normal TM's and external ear canals both ears Nose: Nares normal. Septum midline. Mucosa normal. No drainage or sinus tenderness. Throat: lips, mucosa, and tongue normal; teeth and gums normal Neck: no adenopathy, no carotid bruit, no JVD, supple, symmetrical, trachea midline and thyroid not enlarged, symmetric, no tenderness/mass/nodules Lungs: clear to auscultation bilaterally Breasts: normal appearance, no masses or tenderness Heart: regular rate and rhythm, S1, S2 normal, no murmur, click, rub or gallop Abdomen: soft, non-tender; bowel sounds normal; no masses,  no organomegaly Extremities: extremities normal, atraumatic, no cyanosis or edema Pulses: 2+ and symmetric Skin: Skin color, texture, turgor normal. No rashes or lesions Neurologic: Alert and oriented X 3, normal strength and tone. Normal symmetric reflexes. Normal coordination and gait.  Assessment & Plan:   Problem List Items Addressed This Visit    DDD (degenerative disc disease), lumbar    Causing chronic pain, managed with ESI and trigger point injections prn by Dr Andree Elk.  Aggravated by use of her left foot brace. She is encouraged to increase her use of tylenol to 2000 mg daily .  She is IADL      Essential hypertension, benign    Well controlled on current regimen. Renal function stable, no changes today.  Lab Results  Component Value Date   CREATININE 0.79 06/16/2018   Lab Results  Component Value Date   NA 142 06/16/2018   K 3.6  06/16/2018   CL 106 06/16/2018   CO2 28 06/16/2018         Hyperlipidemia LDL goal <130    Managed with zetia.  LDL is at goal  lfts are normal.  No changes toda   Lab Results  Component Value Date   CHOL 167 06/16/2018   HDL 49.50 06/16/2018   LDLCALC 95 06/16/2018   LDLDIRECT 103.0 06/16/2017   TRIG 111.0 06/16/2018   CHOLHDL 3 06/16/2018   Lab Results  Component Value Date   ALT 12 06/16/2018   AST 16 06/16/2018   ALKPHOS 68 06/16/2018   BILITOT 0.7 06/16/2018         Hypovitaminosis D    Resolved.  Continue current supplements.       Osteoporosis - Primary    With  Prior Lumbar compression  fracture.  Continue Prolia, next injection due today   .Reviewed use of calcium and Vit D supplements.  Last dexa in 2017 showed improved T scores compared to 2014 [facility])       Relevant Medications   denosumab (PROLIA) injection 60 mg (Completed)    A total of 40 minutes was spent with patient more than half of which was spent in counseling patient on the above mentioned issues , reviewing and explaining recent labs and imaging studies done, and coordination of care.   I am having [name] start on Zoster Vaccine Adjuvanted. I am also having her maintain her calcium citrate-vitamin D, acetaminophen, Probiotic Product (ALIGN PO), Vitamin D3, Multiple Vitamins-Minerals (CENTRUM SILVER 50+WOMEN PO), CRANBERRY PO, omeprazole, ezetimibe, mupirocin ointment, and atenolol. We administered denosumab. We will continue to administer denosumab.  Meds ordered this encounter  Medications  . Zoster Vaccine Adjuvanted [facility]) injection    Sig: Inject 0.5 mLs into the muscle once for 1 dose.    Dispense:  1 each    Refill:  1  . denosumab (PROLIA) injection 60 mg    There are no discontinued medications.  Follow-up: Return in about 6 months (around 12/19/2018).   Crecencio Mc, MD

## 2018-06-20 NOTE — Assessment & Plan Note (Signed)
Well controlled on current regimen. Renal function stable, no changes today.  Lab Results  Component Value Date   CREATININE 0.79 06/16/2018   Lab Results  Component Value Date   NA 142 06/16/2018   K 3.6 06/16/2018   CL 106 06/16/2018   CO2 28 06/16/2018

## 2018-06-20 NOTE — Telephone Encounter (Signed)
Spoke with pt about this during her office visit.

## 2018-06-20 NOTE — Assessment & Plan Note (Signed)
Resolved.  Continue current supplements.

## 2018-06-20 NOTE — Assessment & Plan Note (Signed)
With  Prior Lumbar compression  fracture.  Continue Prolia, next injection due today   .Reviewed use of calcium and Vit D supplements.  Last dexa in 2017 showed improved T scores compared to 2014 [facility])

## 2018-06-20 NOTE — Assessment & Plan Note (Signed)
Managed with zetia.  LDL is at goal  lfts are normal.  No changes toda   Lab Results  Component Value Date   CHOL 167 06/16/2018   HDL 49.50 06/16/2018   LDLCALC 95 06/16/2018   LDLDIRECT 103.0 06/16/2017   TRIG 111.0 06/16/2018   CHOLHDL 3 06/16/2018   Lab Results  Component Value Date   ALT 12 06/16/2018   AST 16 06/16/2018   ALKPHOS 68 06/16/2018   BILITOT 0.7 06/16/2018

## 2018-06-20 NOTE — Assessment & Plan Note (Signed)
Causing chronic pain, managed with ESI and trigger point injections prn by Dr Andree Elk.  Aggravated by use of her left foot brace. She is encouraged to increase her use of tylenol to 2000 mg daily .  She is IADL

## 2018-06-20 NOTE — Patient Instructions (Addendum)
You are doing very well!  You do not need to lose any weight.  YOUR BMI is normal.  You are up to date on Pneumonia vaccines    The ShingRx vaccine is now available in local pharmacies and is much more protective thant Zostavaxs,  It is therefore ADVISED for all interested adults over 50 to prevent shingles   I have given you a prescription to have it done    Health Maintenance for Postmenopausal Women Menopause is a normal process in which your reproductive ability comes to an end. This process happens gradually over a span of months to years, usually between the ages of 46 and 90. Menopause is complete when you have missed 12 consecutive menstrual periods. It is important to talk with your health care provider about some of the most common conditions that affect postmenopausal women, such as heart disease, cancer, and bone loss (osteoporosis). Adopting a healthy lifestyle and getting preventive care can help to promote your health and wellness. Those actions can also lower your chances of developing some of these common conditions. What should I know about menopause? During menopause, you may experience a number of symptoms, such as:  Moderate-to-severe hot flashes.  Night sweats.  Decrease in sex drive.  Mood swings.  Headaches.  Tiredness.  Irritability.  Memory problems.  Insomnia.  Choosing to treat or not to treat menopausal changes is an individual decision that you make with your health care provider. What should I know about hormone replacement therapy and supplements? Hormone therapy products are effective for treating symptoms that are associated with menopause, such as hot flashes and night sweats. Hormone replacement carries certain risks, especially as you become older. If you are thinking about using estrogen or estrogen with progestin treatments, discuss the benefits and risks with your health care provider. What should I know about heart disease and stroke? Heart  disease, heart attack, and stroke become more likely as you age. This may be due, in part, to the hormonal changes that your body experiences during menopause. These can affect how your body processes dietary fats, triglycerides, and cholesterol. Heart attack and stroke are both medical emergencies. There are many things that you can do to help prevent heart disease and stroke:  Have your blood pressure checked at least every 1-2 years. High blood pressure causes heart disease and increases the risk of stroke.  If you are 37-33 years old, ask your health care provider if you should take aspirin to prevent a heart attack or a stroke.  Do not use any tobacco products, including cigarettes, chewing tobacco, or electronic cigarettes. If you need help quitting, ask your health care provider.  It is important to eat a healthy diet and maintain a healthy weight. ? Be sure to include plenty of vegetables, fruits, low-fat dairy products, and lean protein. ? Avoid eating foods that are high in solid fats, added sugars, or salt (sodium).  Get regular exercise. This is one of the most important things that you can do for your health. ? Try to exercise for at least 150 minutes each week. The type of exercise that you do should increase your heart rate and make you sweat. This is known as moderate-intensity exercise. ? Try to do strengthening exercises at least twice each week. Do these in addition to the moderate-intensity exercise.  Know your numbers.Ask your health care provider to check your cholesterol and your blood glucose. Continue to have your blood tested as directed by your health care  provider.  What should I know about cancer screening? There are several types of cancer. Take the following steps to reduce your risk and to catch any cancer development as early as possible. Breast Cancer  Practice breast self-awareness. ? This means understanding how your breasts normally appear and feel. ? It  also means doing regular breast self-exams. Let your health care provider know about any changes, no matter how small.  If you are 75 or older, have a clinician do a breast exam (clinical breast exam or CBE) every year. Depending on your age, family history, and medical history, it may be recommended that you also have a yearly breast X-ray (mammogram).  If you have a family history of breast cancer, talk with your health care provider about genetic screening.  If you are at high risk for breast cancer, talk with your health care provider about having an MRI and a mammogram every year.  Breast cancer (BRCA) gene test is recommended for women who have family members with BRCA-related cancers. Results of the assessment will determine the need for genetic counseling and BRCA1 and for BRCA2 testing. BRCA-related cancers include these types: ? Breast. This occurs in males or females. ? Ovarian. ? Tubal. This may also be called fallopian tube cancer. ? Cancer of the abdominal or pelvic lining (peritoneal cancer). ? Prostate. ? Pancreatic.  Cervical, Uterine, and Ovarian Cancer Your health care provider may recommend that you be screened regularly for cancer of the pelvic organs. These include your ovaries, uterus, and vagina. This screening involves a pelvic exam, which includes checking for microscopic changes to the surface of your cervix (Pap test).  For women ages 21-65, health care providers may recommend a pelvic exam and a Pap test every three years. For women ages 56-65, they may recommend the Pap test and pelvic exam, combined with testing for human papilloma virus (HPV), every five years. Some types of HPV increase your risk of cervical cancer. Testing for HPV may also be done on women of any age who have unclear Pap test results.  Other health care providers may not recommend any screening for nonpregnant women who are considered low risk for pelvic cancer and have no symptoms. Ask your  health care provider if a screening pelvic exam is right for you.  If you have had past treatment for cervical cancer or a condition that could lead to cancer, you need Pap tests and screening for cancer for at least 20 years after your treatment. If Pap tests have been discontinued for you, your risk factors (such as having a new sexual partner) need to be reassessed to determine if you should start having screenings again. Some women have medical problems that increase the chance of getting cervical cancer. In these cases, your health care provider may recommend that you have screening and Pap tests more often.  If you have a family history of uterine cancer or ovarian cancer, talk with your health care provider about genetic screening.  If you have vaginal bleeding after reaching menopause, tell your health care provider.  There are currently no reliable tests available to screen for ovarian cancer.  Lung Cancer Lung cancer screening is recommended for adults 103-2 years old who are at high risk for lung cancer because of a history of smoking. A yearly low-dose CT scan of the lungs is recommended if you:  Currently smoke.  Have a history of at least 30 pack-years of smoking and you currently smoke or have quit within  the past 15 years. A pack-year is smoking an average of one pack of cigarettes per day for one year.  Yearly screening should:  Continue until it has been 15 years since you quit.  Stop if you develop a health problem that would prevent you from having lung cancer treatment.  Colorectal Cancer  This type of cancer can be detected and can often be prevented.  Routine colorectal cancer screening usually begins at age 54 and continues through age 32.  If you have risk factors for colon cancer, your health care provider may recommend that you be screened at an earlier age.  If you have a family history of colorectal cancer, talk with your health care provider about genetic  screening.  Your health care provider may also recommend using home test kits to check for hidden blood in your stool.  A small camera at the end of a tube can be used to examine your colon directly (sigmoidoscopy or colonoscopy). This is done to check for the earliest forms of colorectal cancer.  Direct examination of the colon should be repeated every 5-10 years until age 74. However, if early forms of precancerous polyps or small growths are found or if you have a family history or genetic risk for colorectal cancer, you may need to be screened more often.  Skin Cancer  Check your skin from head to toe regularly.  Monitor any moles. Be sure to tell your health care provider: ? About any new moles or changes in moles, especially if there is a change in a mole's shape or color. ? If you have a mole that is larger than the size of a pencil eraser.  If any of your family members has a history of skin cancer, especially at a young age, talk with your health care provider about genetic screening.  Always use sunscreen. Apply sunscreen liberally and repeatedly throughout the day.  Whenever you are outside, protect yourself by wearing long sleeves, pants, a wide-brimmed hat, and sunglasses.  What should I know about osteoporosis? Osteoporosis is a condition in which bone destruction happens more quickly than new bone creation. After menopause, you may be at an increased risk for osteoporosis. To help prevent osteoporosis or the bone fractures that can happen because of osteoporosis, the following is recommended:  If you are 27-60 years old, get at least 1,000 mg of calcium and at least 600 mg of vitamin D per day.  If you are older than age 39 but younger than age 19, get at least 1,200 mg of calcium and at least 600 mg of vitamin D per day.  If you are older than age 77, get at least 1,200 mg of calcium and at least 800 mg of vitamin D per day.  Smoking and excessive alcohol intake  increase the risk of osteoporosis. Eat foods that are rich in calcium and vitamin D, and do weight-bearing exercises several times each week as directed by your health care provider. What should I know about how menopause affects my mental health? Depression may occur at any age, but it is more common as you become older. Common symptoms of depression include:  Low or sad mood.  Changes in sleep patterns.  Changes in appetite or eating patterns.  Feeling an overall lack of motivation or enjoyment of activities that you previously enjoyed.  Frequent crying spells.  Talk with your health care provider if you think that you are experiencing depression. What should I know about immunizations? It  is important that you get and maintain your immunizations. These include:  Tetanus, diphtheria, and pertussis (Tdap) booster vaccine.  Influenza every year before the flu season begins.  Pneumonia vaccine.  Shingles vaccine.  Your health care provider may also recommend other immunizations. This information is not intended to replace advice given to you by your health care provider. Make sure you discuss any questions you have with your health care provider. Document Released: 11/06/2005 Document Revised: 04/03/2016 Document Reviewed: 06/18/2015 Elsevier Interactive Patient Education  2018 [employer].

## 2018-08-04 ENCOUNTER — Ambulatory Visit (INDEPENDENT_AMBULATORY_CARE_PROVIDER_SITE_OTHER): Payer: Medicare Other | Admitting: Podiatry

## 2018-08-04 DIAGNOSIS — M25572 Pain in left ankle and joints of left foot: Secondary | ICD-10-CM

## 2018-08-04 DIAGNOSIS — M722 Plantar fascial fibromatosis: Secondary | ICD-10-CM

## 2018-08-04 MED ORDER — TRIAMCINOLONE ACETONIDE 10 MG/ML IJ SUSP
10.0000 mg | Freq: Once | INTRAMUSCULAR | Status: AC
  Administered 2018-08-04: 10 mg

## 2018-08-07 NOTE — Progress Notes (Signed)
Subjective: Brenda Cardenas presents to the office today requesting steroid injections into her left foot.  She would like an injection into the heel as well as the onset aspect of the foot on the sinus tarsi.  She has not been able to come into the appointment to see me for quite some time given multiple other medical issues.  She denies any recent injury or trauma to her feet no increase in swelling.  She is also asking for new brace in the left foot. Denies any systemic complaints such as fevers, chills, nausea, vomiting. No acute changes since last appointment, and no other complaints at this time.   Objective: AAO x3, NAD DP/PT pulses palpable bilaterally, CRT less than 3 seconds There is tenderness along the left foot on the lateral aspect along the sinus tarsi there is decreased range of motion of the subtalar joint and pain with subtalar joint range of motion.  There is tenderness the plantar medial tubercle of the calcaneus at the insertion of plantar fascia.  Plantar fascia appears to be intact.  There is no other area of tenderness identified at this time.  Ankle joint range of motion intact without any restrictions.  There is no pain with lateral compression of the calcaneus.  No overlying edema, erythema, increase in warmth. Significant flatfoot is present as well as hammertoe deformities.   Minimal hyperkeratotic tissue present to the heel as well as submetatarsal but she recently had a area trend of a pedicure. No open lesions or pre-ulcerative lesions.  No pain with calf compression, swelling, warmth, erythema  Assessment: Capsulitis subtalar joint left side with plantar fasciitis  Plan: -All treatment options discussed with the patient including all alternatives, risks, complications.  -She is requesting steroid injections today.  See procedure notes below.  I also dispensed a new Tri-Lock ankle brace at her request.   -She gets pedicures and she is scheduled to get one in the next  couple weeks.  She declined me to trim the calluses.  Procedure: Injection Tendon/Ligament Discussed alternatives, risks, complications and verbal consent was obtained.  Location: Left plantar fascia at the glabrous junction; medial approach. Skin Prep: Alcohol. Injectate: 1 cc 0.5% marcaine plain, 1 cc 0.5% Marcaine plain and, 1 cc kenalog 10. Disposition: Patient tolerated procedure well. Injection site dressed with a band-aid.  Post-injection care was discussed and return precautions discussed.    Procedure: Injection intermediate joint left sinus tarsi  Discussed alternatives, risks, complications and verbal consent was obtained.  Location: Left sinus tarsi Skin Prep: Betadine. Injectate: 1 cc 0.5% marcaine plain, 1 cc 0.5% Marcaine plain and, 1 cc kenalog 10. Disposition: Patient tolerated procedure well. Injection site dressed with a band-aid.  Post-injection care was discussed and return precautions discussed.  *After injection she had improved range of motion subtalar joint.  -Patient encouraged to call the office with any questions, concerns, change in symptoms.   Trula Slade DPM

## 2018-08-12 ENCOUNTER — Ambulatory Visit
Admission: RE | Admit: 2018-08-12 | Discharge: 2018-08-12 | Disposition: A | Discharge status: Home / Self Care | Payer: Medicare Other | Source: Ambulatory Visit | Attending: Anesthesiology | Admitting: Anesthesiology

## 2018-08-12 ENCOUNTER — Ambulatory Visit (HOSPITAL_BASED_OUTPATIENT_CLINIC_OR_DEPARTMENT_OTHER): Payer: Medicare Other | Admitting: Anesthesiology

## 2018-08-12 ENCOUNTER — Other Ambulatory Visit: Payer: Self-pay

## 2018-08-12 ENCOUNTER — Other Ambulatory Visit: Payer: Self-pay | Admitting: Anesthesiology

## 2018-08-12 ENCOUNTER — Encounter: Payer: Self-pay | Admitting: Anesthesiology

## 2018-08-12 VITALS — BP 168/77 | HR 57 | Temp 97.6°F | Resp 18 | Ht 66.0 in | Wt 147.0 lb

## 2018-08-12 DIAGNOSIS — R52 Pain, unspecified: Secondary | ICD-10-CM | POA: Diagnosis present

## 2018-08-12 DIAGNOSIS — M545 Low back pain, unspecified: Secondary | ICD-10-CM

## 2018-08-12 DIAGNOSIS — M5441 Lumbago with sciatica, right side: Secondary | ICD-10-CM

## 2018-08-12 DIAGNOSIS — M5136 Other intervertebral disc degeneration, lumbar region: Secondary | ICD-10-CM

## 2018-08-12 DIAGNOSIS — M48061 Spinal stenosis, lumbar region without neurogenic claudication: Secondary | ICD-10-CM

## 2018-08-12 DIAGNOSIS — G8929 Other chronic pain: Secondary | ICD-10-CM

## 2018-08-12 DIAGNOSIS — M25551 Pain in right hip: Secondary | ICD-10-CM

## 2018-08-12 DIAGNOSIS — M4125 Other idiopathic scoliosis, thoracolumbar region: Secondary | ICD-10-CM

## 2018-08-12 MED ORDER — LIDOCAINE HCL (PF) 1 % IJ SOLN
INTRAMUSCULAR | Status: AC
  Filled 2018-08-12: qty 5

## 2018-08-12 MED ORDER — ROPIVACAINE HCL 2 MG/ML IJ SOLN
10.0000 mL | Freq: Once | INTRAMUSCULAR | Status: AC
  Administered 2018-08-12: 10 mL via EPIDURAL

## 2018-08-12 MED ORDER — TRIAMCINOLONE ACETONIDE 40 MG/ML IJ SUSP
INTRAMUSCULAR | Status: AC
  Filled 2018-08-12: qty 1

## 2018-08-12 MED ORDER — SODIUM CHLORIDE (PF) 0.9 % IJ SOLN
INTRAMUSCULAR | Status: AC
  Filled 2018-08-12: qty 10

## 2018-08-12 MED ORDER — IOPAMIDOL (ISOVUE-M 200) INJECTION 41%
INTRAMUSCULAR | Status: AC
  Filled 2018-08-12: qty 10

## 2018-08-12 MED ORDER — SODIUM CHLORIDE 0.9% FLUSH
10.0000 mL | Freq: Once | INTRAVENOUS | Status: AC
  Administered 2018-08-12: 10 mL

## 2018-08-12 MED ORDER — DEXAMETHASONE SODIUM PHOSPHATE 10 MG/ML IJ SOLN
INTRAMUSCULAR | Status: AC
  Filled 2018-08-12: qty 1

## 2018-08-12 MED ORDER — LIDOCAINE HCL (PF) 1 % IJ SOLN
10.0000 mL | Freq: Once | INTRAMUSCULAR | Status: AC
  Administered 2018-08-12: 5 mL via SUBCUTANEOUS

## 2018-08-12 MED ORDER — IOPAMIDOL (ISOVUE-M 200) INJECTION 41%
20.0000 mL | Freq: Once | INTRAMUSCULAR | Status: DC | PRN
  Administered 2018-08-12: 10 mL
  Filled 2018-08-12: qty 20

## 2018-08-12 MED ORDER — TRIAMCINOLONE ACETONIDE 40 MG/ML IJ SUSP
40.0000 mg | Freq: Once | INTRAMUSCULAR | Status: AC
  Administered 2018-08-12: 40 mg

## 2018-08-12 MED ORDER — DEXAMETHASONE SODIUM PHOSPHATE 10 MG/ML IJ SOLN
10.0000 mg | Freq: Once | INTRAMUSCULAR | Status: AC
  Administered 2018-08-12: 10 mg

## 2018-08-12 MED ORDER — ROPIVACAINE HCL 2 MG/ML IJ SOLN
INTRAMUSCULAR | Status: AC
  Filled 2018-08-12: qty 10

## 2018-08-12 NOTE — Progress Notes (Signed)
Safety precautions to be maintained throughout the outpatient stay will include: orient to surroundings, keep bed in low position, maintain call bell within reach at all times, provide assistance with transfer out of bed and ambulation.  

## 2018-08-12 NOTE — Progress Notes (Signed)
Subjective:  Patient ID: Brenda Cardenas, female    DOB: 1932-04-05  Age: 84 y.o. MRN: [id number]  CC: Back Pain (lower)   Procedure: L5-S1 epidural steroid under fluoroscopic guidance without sedation and trigger point x2 over the right posterior superior iliac crest  HPI Brenda Cardenas presents for reevaluation.  She was last seen a few months ago and continues to have chronic pain of the low back with radiation into the right hip and knee and left foot.  The quality characteristic distribution of the pain is been stable in nature without any significant changes.  She has responded favorably to epidural steroids in the past.  She generally gets 50% reduction in her low back pain and some improvement in her hip pain.  She occasionally has some cramping of the calves and legs but this gets better with the epidural steroid injections.  She still has chronic left foot pain and this is being evaluated by her podiatrist.  She also has some posterior right hip pain that is chronic gnawing aching aggravating and worse with sitting or laying down.  She tries to walk with a cane that sometimes helps alleviate some of the hip pain.  She has severe scoliosis and this does cause difficulty with her mechanical posture.  No change in lower extremity strength or function bowel bladder function are noted at this time.  She only takes Tylenol for pain management.  Outpatient Medications Prior to Visit  Medication Sig Dispense Refill  . acetaminophen (TYLENOL) 325 MG tablet Take 650 mg by mouth 2 (two) times daily.     Marland Kitchen atenolol (TENORMIN) 25 MG tablet TAKE ONE TABLET EVERY DAY 90 tablet 1  . calcium citrate-vitamin D (CITRACAL+D) 315-200 MG-UNIT per tablet Take 2 tablets by mouth daily.    . Cholecalciferol (VITAMIN D3) 2000 units TABS Take 2,000 Units by mouth daily.     Marland Kitchen CRANBERRY PO Take 2 capsules by mouth daily.     Marland Kitchen ezetimibe (ZETIA) 10 MG tablet TAKE 1 TABLET BY MOUTH DAILY 90 tablet 1  . Multiple  Vitamins-Minerals (CENTRUM SILVER 50+WOMEN PO) 1 tablet by mouth every day    . mupirocin ointment (BACTROBAN) 2 %     . omeprazole (PRILOSEC) 20 MG capsule Take 1 capsule (20 mg total) by mouth daily. 90 capsule 1  . Probiotic Product (ALIGN PO) Take 1 tablet by mouth daily.      Facility-Administered Medications Prior to Visit  Medication Dose Route Frequency Provider Last Rate Last Dose  . denosumab (PROLIA) injection 60 mg  60 mg Subcutaneous Once Einar Pheasant, MD        Review of Systems CNS: No confusion or sedation Cardiac: No angina or palpitations GI: No abdominal pain or constipation Constitutional: No nausea vomiting fevers or chills  Objective:  BP (!) 168/77   Pulse (!) 57   Temp 97.6 F (36.4 C) (Oral)   Resp 18   Ht 5\' 6"  (1.676 m)   Wt 147 lb (66.7 kg)   SpO2 100%   BMI 23.73 kg/m    BP Readings from Last 3 Encounters:  08/12/18 (!) 168/77  06/20/18 128/78  04/20/18 (!) 140/48     Wt Readings from Last 3 Encounters:  08/12/18 147 lb (66.7 kg)  06/20/18 148 lb (67.1 kg)  04/20/18 149 lb (67.6 kg)     Physical Exam Pt is alert and oriented PERRL EOMI HEART IS RRR no murmur or rub LCTA no wheezing or rales MUSCULOSKELETAL reveals 2  trigger points over the posterior superior iliac crest.  She has normal tone and muscle bulk to the lower extremities and this is at baseline.  She ambulates with a severe antalgic gait with the assistance of a cane.  She does have significant rotary scoliosis in the lumbar and thoracolumbar spine.  Labs  Lab Results  Component Value Date   HGBA1C 5.5 06/16/2018   HGBA1C 5.7 03/24/2018   HGBA1C 5.5 06/16/2017   Lab Results  Component Value Date   MICROALBUR 1.6 06/16/2017   LDLCALC 95 06/16/2018   CREATININE 0.79 06/16/2018    -------------------------------------------------------------------------------------------------------------------- Lab Results  Component Value Date   WBC 11.0 (H) 06/16/2018    HGB 12.8 06/16/2018   HCT 38.2 06/16/2018   PLT 174.0 06/16/2018   GLUCOSE 97 06/16/2018   CHOL 167 06/16/2018   TRIG 111.0 06/16/2018   HDL 49.50 06/16/2018   LDLDIRECT 103.0 06/16/2017   LDLCALC 95 06/16/2018   ALT 12 06/16/2018   AST 16 06/16/2018   NA 142 06/16/2018   K 3.6 06/16/2018   CL 106 06/16/2018   CREATININE 0.79 06/16/2018   BUN 24 (H) 06/16/2018   CO2 28 06/16/2018   TSH 0.78 12/14/2016   HGBA1C 5.5 06/16/2018   MICROALBUR 1.6 06/16/2017    --------------------------------------------------------------------------------------------------------------------- No results found.   Assessment & Plan:   Brenda Cardenas was seen today for back pain.  Diagnoses and all orders for this visit:  Acute bilateral low back pain with right-sided sciatica  Spinal stenosis of lumbar region without neurogenic claudication  DDD (degenerative disc disease), lumbar  Other idiopathic scoliosis, thoracolumbar region  Chronic right-sided low back pain without sciatica  Pain of right hip joint  Other orders -     triamcinolone acetonide (KENALOG-40) injection 40 mg -     sodium chloride flush (NS) 0.9 % injection 10 mL -     ropivacaine (PF) 2 mg/mL (0.2%) (NAROPIN) injection 10 mL -     lidocaine (PF) (XYLOCAINE) 1 % injection 10 mL -     iopamidol (ISOVUE-M) 41 % intrathecal injection 20 mL -     dexamethasone (DECADRON) injection 10 mg        ----------------------------------------------------------------------------------------------------------------------  Problem List Items Addressed This Visit      Unprioritized   DDD (degenerative disc disease), lumbar   Relevant Medications   triamcinolone acetonide (KENALOG-40) injection 40 mg (Completed)   dexamethasone (DECADRON) injection 10 mg (Completed)   Idiopathic scoliosis   Right-sided low back pain without sciatica   Relevant Medications   triamcinolone acetonide (KENALOG-40) injection 40 mg (Completed)    dexamethasone (DECADRON) injection 10 mg (Completed)   Spinal stenosis of lumbar region    Other Visit Diagnoses    Acute bilateral low back pain with right-sided sciatica    -  Primary   Relevant Medications   triamcinolone acetonide (KENALOG-40) injection 40 mg (Completed)   dexamethasone (DECADRON) injection 10 mg (Completed)   Pain of right hip joint            ----------------------------------------------------------------------------------------------------------------------  1. Acute bilateral low back pain with right-sided sciatica Continue with core stretching strengthening as reviewed today.  TENS unit application could be of benefit.  Continue with physical therapy exercises  2. Spinal stenosis of lumbar region without neurogenic claudication C with a lumbar epidural steroid injection today to see if we can help with her low back pain and radicular pain.  We have gone over the risks and benefits of this with her in full  detail and all questions been answered.  She is to return to clinic in approximately 1 to 2 months with possible repeat injection at that time.  3. DDD (degenerative disc disease), lumbar As above  4. Other idiopathic scoliosis, thoracolumbar region As above and continue with physical therapy.  5. Chronic right-sided low back pain without sciatica As above  6. Pain of right hip joint We will perform 2 trigger points overlying the posterior superior iliac crest approximately 2 cm caudal to the right trochanter    ----------------------------------------------------------------------------------------------------------------------  I am having Brenda Cardenas maintain her calcium citrate-vitamin D, acetaminophen, Probiotic Product (ALIGN PO), Vitamin D3, Multiple Vitamins-Minerals (CENTRUM SILVER 50+WOMEN PO), CRANBERRY PO, omeprazole, ezetimibe, mupirocin ointment, and atenolol. We administered triamcinolone acetonide, sodium chloride flush, ropivacaine  (PF) 2 mg/mL (0.2%), lidocaine (PF), iopamidol, and dexamethasone. We will continue to administer denosumab.   Meds ordered this encounter  Medications  . triamcinolone acetonide (KENALOG-40) injection 40 mg  . sodium chloride flush (NS) 0.9 % injection 10 mL  . ropivacaine (PF) 2 mg/mL (0.2%) (NAROPIN) injection 10 mL  . lidocaine (PF) (XYLOCAINE) 1 % injection 10 mL  . iopamidol (ISOVUE-M) 41 % intrathecal injection 20 mL  . dexamethasone (DECADRON) injection 10 mg   Patient's Medications  New Prescriptions   No medications on file  Previous Medications   ACETAMINOPHEN (TYLENOL) 325 MG TABLET    Take 650 mg by mouth 2 (two) times daily.    ATENOLOL (TENORMIN) 25 MG TABLET    TAKE ONE TABLET EVERY DAY   CALCIUM CITRATE-VITAMIN D (CITRACAL+D) 315-200 MG-UNIT PER TABLET    Take 2 tablets by mouth daily.   CHOLECALCIFEROL (VITAMIN D3) 2000 UNITS TABS    Take 2,000 Units by mouth daily.    CRANBERRY PO    Take 2 capsules by mouth daily.    EZETIMIBE (ZETIA) 10 MG TABLET    TAKE 1 TABLET BY MOUTH DAILY   MULTIPLE VITAMINS-MINERALS (CENTRUM SILVER 50+WOMEN PO)    1 tablet by mouth every day   MUPIROCIN OINTMENT (BACTROBAN) 2 %       OMEPRAZOLE (PRILOSEC) 20 MG CAPSULE    Take 1 capsule (20 mg total) by mouth daily.   PROBIOTIC PRODUCT (ALIGN PO)    Take 1 tablet by mouth daily.   Modified Medications   No medications on file  Discontinued Medications   No medications on file   ----------------------------------------------------------------------------------------------------------------------  Follow-up: No follow-ups on file.   Procedure: 5 S1 LESI with fluoroscopic guidance and no moderate sedation  NOTE: The risks, benefits, and expectations of the procedure have been discussed and explained to the patient who was understanding and in agreement with suggested treatment plan. No guarantees were made.  DESCRIPTION OF PROCEDURE: Lumbar epidural steroid injection with no IV Versed,  EKG, blood pressure, pulse, and pulse oximetry monitoring. The procedure was performed with the patient in the prone position under fluoroscopic guidance.  Sterile prep x3 was initiated and I then injected subcutaneous lidocaine to the overlying L5-S1 site after its fluoroscopic identifictation.  Using strict aseptic technique, I then advanced an 18-gauge Tuohy epidural needle in the midline using interlaminar approach via loss-of-resistance to saline technique. There was negative aspiration for heme or  CSF.  I then confirmed position with both AP and Lateral fluoroscan.  2 cc of Isovue were injected and a  total of 5 mL of Preservative-Free normal saline mixed with 40 mg of Kenalog and 1cc Ropicaine 0.2 percent were injected incrementally via the  epidurally placed needle. The needle was removed. The patient tolerated the injection well and was convalesced and discharged to home in stable condition. Should the patient have any post procedure difficulty they have been instructed on how to contact us for assistance.    Trigger point injection: The area overlying the aforementioned trigger points were prepped with alcohol. They were then injected with a 25-gauge needle with 6 cc of ropivacaine 0.2% and Decadron 10mg  at each site after negative aspiration for heme. This was performed after informed consent was obtained and risks and benefits reviewed. She tolerated this procedure without difficulty and was convalesced and discharged to home in stable condition for follow-up as mentioned.  @Brenda Cardenas  Andree Elk, MD@  Brenda Barrows, MD

## 2018-08-15 ENCOUNTER — Telehealth: Payer: Self-pay | Admitting: *Deleted

## 2018-08-15 ENCOUNTER — Telehealth: Payer: Self-pay

## 2018-08-15 NOTE — Telephone Encounter (Signed)
Voicemail left re; procedure to call if there are any questions or concerns.

## 2018-08-15 NOTE — Telephone Encounter (Signed)
Post procedure phone call.  LM 

## 2018-08-22 NOTE — Progress Notes (Deleted)
Subjective:   Brenda Cardenas is a 84 y.o. female who presents for Medicare Annual (Subsequent) preventive examination.  Review of Systems:  ***       Objective:     Vitals: Ht 5\' 6"  (1.676 m)   Wt 146 lb (66.2 kg)   BMI 23.57 kg/m   Body mass index is 23.57 kg/m.  Advanced Directives 04/20/2018 02/28/2018 11/01/2017 10/29/2017 09/24/2017 07/21/2017 06/03/2017  Does Patient Have a Medical Advance Directive? Yes Yes No Yes Yes Yes Yes  Type of [place] administrator Power of [place];Living will [place];Living will [place];Living will -  Does patient want to make changes to medical advance directive? No - Patient declined - Yes (Inpatient - patient requests chaplain consult to change a medical advance directive) No - Patient declined No - Patient declined No - Patient declined -  Copy of [place] in Chart? Yes Yes - No - copy requested Yes Yes -    Tobacco Social History   Tobacco Use  Smoking Status Never Smoker  Smokeless Tobacco Never Used     Counseling given: Not Answered   Clinical Intake:  Pre-visit preparation completed: Yes  Pain : No/denies pain     Nutritional Status: BMI of 19-24  Normal Diabetes: No  How often do you need to have someone help you when you read instructions, pamphlets, or other written materials from your doctor or pharmacy?: 1 - Never  Interpreter Needed?: No     Past Medical History:  Diagnosis Date  . Arthritis   . Chronic kidney disease   . Colon polyps   . Gastritis   . GERD (gastroesophageal reflux disease)   . Hiatal hernia   . Hyperlipidemia   . Hypertension   . IBS (irritable bowel syndrome)    Past Surgical History:  Procedure Laterality Date  . APPENDECTOMY  1959  . CARPAL TUNNEL RELEASE    . CATARACT EXTRACTION W/ INTRAOCULAR LENS IMPLANT Left   . CATARACT  EXTRACTION W/PHACO Right 04/20/2018   Procedure: CATARACT EXTRACTION PHACO AND INTRAOCULAR LENS PLACEMENT ([facility])  RIGHT;  Surgeon: Leandrew Koyanagi, MD;  Location: [facility];  Service: Ophthalmology;  Laterality: Right;  . CHOLECYSTECTOMY  1995  . GANGLION CYST EXCISION    . JOINT REPLACEMENT Right July 2009   [facility]   . KNEE ARTHROSCOPY    . LITHOTRIPSY    . SALIVARY GLAND SURGERY    . SKIN CANCER EXCISION    . TONSILLECTOMY    . TONSILLECTOMY AND ADENOIDECTOMY    . VEIN LIGATION     Family History  Problem Relation Age of Onset  . Heart disease Mother   . Arthritis Mother   . Diabetes Father    Social History   Socioeconomic History  . Marital status: Single    Spouse name: Not on file  . Number of children: Not on file  . Years of education: Not on file  . Highest education level: Not on file  Occupational History  . Not on file  Social Needs  . Financial resource strain: Not hard at all  . Food insecurity:    Worry: Never true    Inability: Never true  . Transportation needs:    Medical: No    Non-medical: No  Tobacco Use  . Smoking status: Never Smoker  . Smokeless tobacco: Never Used  Substance and Sexual Activity  .  Alcohol use: No    Alcohol/week: 0.0 standard drinks  . Drug use: No  . Sexual activity: Never  Lifestyle  . Physical activity:    Days per week: 7 days    Minutes per session: 60 min  . Stress: Not at all  Relationships  . Social connections:    Talks on phone: Not on file    Gets together: Not on file    Attends religious service: Not on file    Active member of club or organization: Not on file    Attends meetings of clubs or organizations: Not on file    Relationship status: Not on file  Other Topics Concern  . Not on file  Social History Narrative  . Not on file    Outpatient Encounter Medications as of 08/22/2018  Medication Sig  . acetaminophen (TYLENOL) 325 MG tablet Take 650 mg by mouth 2 (two) times daily.     Marland Kitchen atenolol (TENORMIN) 25 MG tablet TAKE ONE TABLET EVERY DAY  . calcium citrate-vitamin D (CITRACAL+D) 315-200 MG-UNIT per tablet Take 2 tablets by mouth daily.  . Cholecalciferol (VITAMIN D3) 2000 units TABS Take 2,000 Units by mouth daily.   Marland Kitchen CRANBERRY PO Take 2 capsules by mouth daily.   Marland Kitchen ezetimibe (ZETIA) 10 MG tablet TAKE 1 TABLET BY MOUTH DAILY  . Multiple Vitamins-Minerals (CENTRUM SILVER 50+WOMEN PO) 1 tablet by mouth every day  . mupirocin ointment (BACTROBAN) 2 %   . omeprazole (PRILOSEC) 20 MG capsule Take 1 capsule (20 mg total) by mouth daily.  . Probiotic Product (ALIGN PO) Take 1 tablet by mouth daily.    Facility-Administered Encounter Medications as of 08/22/2018  Medication  . denosumab (PROLIA) injection 60 mg  . iopamidol (ISOVUE-M) 41 % intrathecal injection 20 mL    Activities of Daily Living In your present state of health, do you have any difficulty performing the following activities: 04/20/2018 09/24/2017  Hearing? N N  Vision? N N  Difficulty concentrating or making decisions? N N  Walking or climbing stairs? N Y  Dressing or bathing? N N  Doing errands, shopping? - Scientist, forensic and eating ? - N  Using the Toilet? - N  In the past six months, have you accidently leaked urine? - N  Comment - Followed by [facility], Dr. Jacqlyn Larsen  Do you have problems with loss of bowel control? - N  Managing your Medications? - N  Managing your Finances? - N  Housekeeping or managing your Housekeeping? - Y  Comment - Once a month housekeeping assists  Some recent data might be hidden    Patient Care Team: Crecencio Mc, MD as PCP - General (Internal Medicine)    Assessment:   This is a routine wellness examination for Brenda Cardenas.  Exercise Activities and Dietary recommendations    Goals    . Increase physical activity      Begin riding bike and increase Nu step exercise regimen, as tolerated.    . Increase physical activity     Walk for exercise 5 days  weekly, 30 minutes as tolerated       Fall Risk Fall Risk  08/12/2018 02/28/2018 01/25/2018 10/25/2017 09/24/2017  Falls in the past year? 0 No Yes Yes No  Comment - - - - -  Number falls in past yr: - - 2 or more 1 -  Injury with Fall? - No Yes Yes -  Comment - - - - -  Risk Factor Category  - - -  High Fall Risk -  Risk for fall due to : - History of fall(s) - - -  Risk for fall due to: Comment - - - - -  Follow up - - - Falls evaluation completed -   Is the patient's home free of loose throw rugs in walkways, pet beds, electrical cords, etc?   {Blank single:19197::"yes","no"}      Grab bars in the bathroom? {Blank single:19197::"yes","no"}      Handrails on the stairs?   {Blank single:19197::"yes","no"}      Adequate lighting?   {Blank single:19197::"yes","no"}  Timed Get Up and Go performed: ***  Depression Screen PHQ 2/9 Scores 08/12/2018 02/28/2018 01/25/2018 10/25/2017  PHQ - 2 Score 0 0 0 0  PHQ- 9 Score - - - -  Exception Documentation - - - -     Cognitive Function MMSE - Mini Mental State Exam 09/24/2017 06/23/2016  Orientation to time 5 5  Orientation to Place 5 5  Registration 3 3  Attention/ Calculation 5 5  Recall 3 3  Language- name 2 objects 2 2  Language- repeat 1 1  Language- follow 3 step command 3 3  Language- read & follow direction 1 1  Write a sentence 1 1  Copy design 1 1  Total score 30 30        Immunization History  Administered Date(s) Administered  . Influenza Whole 06/15/2013  . Influenza, High Dose Seasonal PF 06/24/2015, 06/22/2016, 06/23/2017, 06/02/2018  . Influenza,inj,Quad PF,6+ Mos 06/18/2014  . Pneumococcal Conjugate-13 06/18/2014  . Pneumococcal Polysaccharide-23 06/15/2013, 03/24/2018  . Tdap 09/28/2008, 03/28/2009, 03/28/2012, 10/17/2017  . Zoster 01/05/2008  . Zoster Recombinat (Shingrix) 07/08/2018    Qualifies for Shingles Vaccine?***  Screening Tests Health Maintenance  Topic Date Due  . TETANUS/TDAP  10/18/2027  .  INFLUENZA VACCINE  Completed  . DEXA SCAN  Completed  . PNA vac Low Risk Adult  Completed    Cancer Screenings: Lung: Low Dose CT Chest recommended if Age 33-80 years, 30 pack-year currently smoking OR have quit w/in 15years. Patient {DOES NOT does:27190::"does not"} qualify. Breast:  Up to date on Mammogram? {Yes/No:30480221}   Up to date of Bone Density/Dexa? {Yes/No:30480221} Colorectal: ***  Additional Screenings: ***: Hepatitis C Screening:      Plan:   ***   I have personally reviewed and noted the following in the patient's chart:   . Medical and social history . Use of alcohol, tobacco or illicit drugs  . Current medications and supplements . Functional ability and status . Nutritional status . Physical activity . Advanced directives . List of other physicians . Hospitalizations, surgeries, and ER visits in previous 12 months . Vitals . Screenings to include cognitive, depression, and falls . Referrals and appointments  In addition, I have reviewed and discussed with patient certain preventive protocols, quality metrics, and best practice recommendations. A written personalized care plan for preventive services as well as general preventive health recommendations were provided to patient.     Varney Biles, LPN  [id number]

## 2018-08-23 ENCOUNTER — Other Ambulatory Visit: Payer: Self-pay | Admitting: Internal Medicine

## 2018-09-10 ENCOUNTER — Other Ambulatory Visit: Payer: Self-pay | Admitting: Internal Medicine

## 2018-09-27 ENCOUNTER — Ambulatory Visit: Payer: Medicare Other

## 2018-11-09 ENCOUNTER — Ambulatory Visit: Payer: Medicare Other | Admitting: Anesthesiology

## 2018-11-22 ENCOUNTER — Telehealth: Payer: Self-pay

## 2018-11-22 NOTE — Telephone Encounter (Signed)
Copied from [place] [id number]. Topic: General - Other >> Nov 22, 2018 12:55 PM Virl Axe D wrote: Reason for CRM: Pt received a missed call from the practice but there was no VM. She wonders if it was regarding her Prolia. Please advise.

## 2018-11-23 NOTE — Telephone Encounter (Signed)
LMTCB. PEC may speak with pt to let her know that her prolia injection has been approved and she is scheduled for an appt with Dr. Derrel Nip in March on the day she is due for her prolia injection.

## 2018-11-23 NOTE — Telephone Encounter (Signed)
Did you call pt yesterday in regards to her prolia injection?

## 2018-11-23 NOTE — Telephone Encounter (Signed)
No but her Prolia will be here todayand she has an appointment in march already with Dr. Derrel Nip on the day her prolia is due.

## 2018-11-28 ENCOUNTER — Other Ambulatory Visit: Payer: Self-pay | Admitting: Internal Medicine

## 2018-11-28 MED ORDER — IPRATROPIUM BROMIDE 0.06 % NA SOLN: 2.0000 | Freq: Four times a day (QID) | NASAL | 12 refills | Status: DC

## 2018-12-01 NOTE — Telephone Encounter (Signed)
Spoke with pt to let her know that we will give her her prolia injection on the day of her appt with Dr. Derrel Nip which is 12/22/2018. Pt gave a verbal understanding.

## 2018-12-09 ENCOUNTER — Other Ambulatory Visit: Payer: Self-pay | Admitting: Internal Medicine

## 2018-12-16 ENCOUNTER — Telehealth: Payer: Self-pay | Admitting: Radiology

## 2018-12-16 DIAGNOSIS — I10 Essential (primary) hypertension: Secondary | ICD-10-CM

## 2018-12-16 DIAGNOSIS — E785 Hyperlipidemia, unspecified: Secondary | ICD-10-CM

## 2018-12-16 DIAGNOSIS — E559 Vitamin D deficiency, unspecified: Secondary | ICD-10-CM

## 2018-12-16 DIAGNOSIS — D72829 Elevated white blood cell count, unspecified: Secondary | ICD-10-CM

## 2018-12-16 NOTE — Telephone Encounter (Signed)
Fasting labs ordered

## 2018-12-16 NOTE — Telephone Encounter (Signed)
Pt coming in for labs Monday, please place future orders. Thank you 

## 2018-12-16 NOTE — Addendum Note (Signed)
Addended by: Crecencio Mc on: 12/16/2018 12:49 PM   Modules accepted: Orders

## 2018-12-19 ENCOUNTER — Other Ambulatory Visit: Payer: Medicare Other

## 2018-12-21 ENCOUNTER — Ambulatory Visit: Payer: Medicare Other | Admitting: Anesthesiology

## 2018-12-22 ENCOUNTER — Ambulatory Visit (INDEPENDENT_AMBULATORY_CARE_PROVIDER_SITE_OTHER): Payer: Medicare Other

## 2018-12-22 ENCOUNTER — Other Ambulatory Visit: Payer: Self-pay

## 2018-12-22 ENCOUNTER — Encounter: Payer: Self-pay | Admitting: Internal Medicine

## 2018-12-22 ENCOUNTER — Ambulatory Visit (INDEPENDENT_AMBULATORY_CARE_PROVIDER_SITE_OTHER): Payer: Medicare Other | Admitting: Internal Medicine

## 2018-12-22 VITALS — BP 138/82 | HR 83 | Temp 98.1°F | Resp 15 | Ht 66.0 in | Wt 145.0 lb

## 2018-12-22 DIAGNOSIS — R05 Cough: Secondary | ICD-10-CM

## 2018-12-22 DIAGNOSIS — M810 Age-related osteoporosis without current pathological fracture: Secondary | ICD-10-CM

## 2018-12-22 DIAGNOSIS — E785 Hyperlipidemia, unspecified: Secondary | ICD-10-CM | POA: Diagnosis not present

## 2018-12-22 DIAGNOSIS — R059 Cough, unspecified: Secondary | ICD-10-CM

## 2018-12-22 DIAGNOSIS — D72829 Elevated white blood cell count, unspecified: Secondary | ICD-10-CM

## 2018-12-22 DIAGNOSIS — I10 Essential (primary) hypertension: Secondary | ICD-10-CM | POA: Diagnosis not present

## 2018-12-22 DIAGNOSIS — E559 Vitamin D deficiency, unspecified: Secondary | ICD-10-CM | POA: Diagnosis not present

## 2018-12-22 LAB — CBC WITH DIFFERENTIAL/PLATELET
BASOS PCT: 0.6 % (ref 0.0–3.0)
Basophils Absolute: 0.1 10*3/uL (ref 0.0–0.1)
Eosinophils Absolute: 0.3 10*3/uL (ref 0.0–0.7)
Eosinophils Relative: 3.7 % (ref 0.0–5.0)
HCT: 36.8 % (ref 36.0–46.0)
Hemoglobin: 12.4 g/dL (ref 12.0–15.0)
Lymphocytes Relative: 27.9 % (ref 12.0–46.0)
Lymphs Abs: 2.2 10*3/uL (ref 0.7–4.0)
MCHC: 33.6 g/dL (ref 30.0–36.0)
MCV: 95.4 fl (ref 78.0–100.0)
Monocytes Absolute: 0.5 10*3/uL (ref 0.1–1.0)
Monocytes Relative: 6.4 % (ref 3.0–12.0)
Neutro Abs: 4.8 10*3/uL (ref 1.4–7.7)
Neutrophils Relative %: 61.4 % (ref 43.0–77.0)
Platelets: 161 10*3/uL (ref 150.0–400.0)
RBC: 3.85 Mil/uL — ABNORMAL LOW (ref 3.87–5.11)
RDW: 18.3 % — AB (ref 11.5–15.5)
WBC: 7.9 10*3/uL (ref 4.0–10.5)

## 2018-12-22 LAB — COMPREHENSIVE METABOLIC PANEL
ALT: 26 U/L (ref 0–35)
AST: 24 U/L (ref 0–37)
Albumin: 3.9 g/dL (ref 3.5–5.2)
Alkaline Phosphatase: 73 U/L (ref 39–117)
BUN: 20 mg/dL (ref 6–23)
CHLORIDE: 104 meq/L (ref 96–112)
CO2: 26 mEq/L (ref 19–32)
Calcium: 9.8 mg/dL (ref 8.4–10.5)
Creatinine, Ser: 0.84 mg/dL (ref 0.40–1.20)
GFR: 64.43 mL/min (ref >60.00)
Glucose, Bld: 97 mg/dL (ref 70–99)
Potassium: 3.9 mEq/L (ref 3.5–5.1)
Sodium: 139 mEq/L (ref 135–145)
Total Bilirubin: 0.9 mg/dL (ref 0.2–1.2)
Total Protein: 6.3 g/dL (ref 6.0–8.3)

## 2018-12-22 LAB — LIPID PANEL
Cholesterol: 184 mg/dL (ref 0–200)
HDL: 49.3 mg/dL (ref >39.00)
LDL Cholesterol: 111 mg/dL — ABNORMAL HIGH (ref 0–99)
NonHDL: 134.55
Total CHOL/HDL Ratio: 4
Triglycerides: 120 mg/dL (ref 0.0–149.0)
VLDL: 24 mg/dL (ref 0.0–40.0)

## 2018-12-22 LAB — VITAMIN D 25 HYDROXY (VIT D DEFICIENCY, FRACTURES): VITD: 57.72 ng/mL (ref 30.00–100.00)

## 2018-12-22 MED ORDER — DENOSUMAB 60 MG/ML ~~LOC~~ SOSY
60.0000 mg | PREFILLED_SYRINGE | Freq: Once | SUBCUTANEOUS | Status: AC
  Administered 2018-12-22: 60 mg via SUBCUTANEOUS

## 2018-12-22 MED ORDER — BENZONATATE 200 MG PO CAPS: 200.0000 mg | ORAL_CAPSULE | Freq: Three times a day (TID) | ORAL | 1 refills | Status: DC | PRN

## 2018-12-22 NOTE — Patient Instructions (Signed)
Resume using Atrovent 1 squirt on each side two times daily  Resume Flonase.  Hydrate nasal passages with saline spray as needed  I have refilled the tessalon cough capsules.  You can use this or the Mucinex DM  (or both) as needed for cough   If the chest x ray suggests persistent bronchitis ,  I will add a prednisone taper to help decrease the inflammation and irritation

## 2018-12-22 NOTE — Progress Notes (Signed)
Subjective:  Patient ID: Brenda Cardenas, female    DOB: 1932-08-02  Age: 85 y.o. MRN: [id number]  CC: The primary encounter diagnosis was Cough. Diagnoses of Hypovitaminosis D, Hyperlipidemia LDL goal <130, Essential hypertension, benign, Leukocytosis, unspecified type, Age related osteoporosis, unspecified pathological fracture presence, and Cough in adult were also pertinent to this visit.  HPI Brenda Cardenas presents for persistent cough  Treated on March 6 by urgent care doc  for bronchitis and sinusitis with ceftin and tessalon perles.Friday.  improved in 3 days .  But cough and nasal drip returned last Fr  Added mucinex DM 6 days ago.  For continued post nasal drip.  Cough had been present since late February. Needs refill on tessalon   Cough improved but then returned  Developed a nosebleed after using Atrovent  Nasal spray.  So had to stop the medication.   Outpatient Medications Prior to Visit  Medication Sig Dispense Refill  . acetaminophen (TYLENOL) 325 MG tablet Take 650 mg by mouth 2 (two) times daily.     Marland Kitchen atenolol (TENORMIN) 25 MG tablet TAKE ONE TABLET EVERY DAY 90 tablet 1  . calcium citrate-vitamin D (CITRACAL+D) 315-200 MG-UNIT per tablet Take 2 tablets by mouth daily.    . Cholecalciferol (VITAMIN D3) 2000 units TABS Take 2,000 Units by mouth daily.     Marland Kitchen CRANBERRY PO Take 2 capsules by mouth daily.     Marland Kitchen ezetimibe (ZETIA) 10 MG tablet TAKE ONE TABLET EVERY DAY 90 tablet 1  . ipratropium (ATROVENT) 0.06 % nasal spray Place 2 sprays into both nostrils 4 (four) times daily. 15 mL 12  . Multiple Vitamins-Minerals (CENTRUM SILVER 50+WOMEN PO) 1 tablet by mouth every day    . mupirocin ointment (BACTROBAN) 2 %     . omeprazole (PRILOSEC) 20 MG capsule TAKE 1 CAPSULE EVERY DAY 90 capsule 1  . Probiotic Product (ALIGN PO) Take 1 tablet by mouth daily.      Facility-Administered Medications Prior to Visit  Medication Dose Route Frequency Provider Last Rate Last Dose  .  denosumab (PROLIA) injection 60 mg  60 mg Subcutaneous Once Einar Pheasant, MD        Review of Systems;  Patient denies headache, fevers, malaise, unintentional weight loss, skin rash, eye pain, sinus congestion and sinus pain, sore throat, dysphagia,  hemoptysis , cough, dyspnea, wheezing, chest pain, palpitations, orthopnea, edema, abdominal pain, nausea, melena, diarrhea, constipation, flank pain, dysuria, hematuria, urinary  Frequency, nocturia, numbness, tingling, seizures,  Focal weakness, Loss of consciousness,  Tremor, insomnia, depression, anxiety, and suicidal ideation.      Objective:  BP 138/82 (BP Location: Left Arm, Patient Position: Sitting, Cuff Size: Normal)   Pulse 83   Temp 98.1 F (36.7 C) (Oral)   Resp 15   Ht 5\' 6"  (1.676 m)   Wt 145 lb (65.8 kg)   SpO2 92%   BMI 23.40 kg/m   BP Readings from Last 3 Encounters:  12/22/18 138/82  08/12/18 (!) 168/77  06/20/18 128/78    Wt Readings from Last 3 Encounters:  12/22/18 145 lb (65.8 kg)  08/12/18 147 lb (66.7 kg)  06/20/18 148 lb (67.1 kg)    General appearance: alert, cooperative and appears stated age Ears: normal TM's and external ear canals both ears Throat: lips, mucosa, and tongue normal; teeth and gums normal Neck: no adenopathy, no carotid bruit, supple, symmetrical, trachea midline and thyroid not enlarged, symmetric, no tenderness/mass/nodules Back: symmetric, no curvature. ROM normal. No CVA tenderness.  Lungs: clear to auscultation bilaterally Heart: regular rate and rhythm, S1, S2 normal, no murmur, click, rub or gallop Abdomen: soft, non-tender; bowel sounds normal; no masses,  no organomegaly Pulses: 2+ and symmetric Skin: Skin color, texture, turgor normal. No rashes or lesions Lymph nodes: Cervical, supraclavicular, and axillary nodes normal.  Lab Results  Component Value Date   HGBA1C 5.5 06/16/2018   HGBA1C 5.7 03/24/2018   HGBA1C 5.5 06/16/2017    Lab Results  Component Value  Date   CREATININE 0.84 12/22/2018   CREATININE 0.79 06/16/2018   CREATININE 0.76 03/24/2018    Lab Results  Component Value Date   WBC 7.9 12/22/2018   HGB 12.4 12/22/2018   HCT 36.8 12/22/2018   PLT 161.0 12/22/2018   GLUCOSE 97 12/22/2018   CHOL 184 12/22/2018   TRIG 120.0 12/22/2018   HDL 49.30 12/22/2018   LDLDIRECT 103.0 06/16/2017   LDLCALC 111 (H) 12/22/2018   ALT 26 12/22/2018   AST 24 12/22/2018   NA 139 12/22/2018   K 3.9 12/22/2018   CL 104 12/22/2018   CREATININE 0.84 12/22/2018   BUN 20 12/22/2018   CO2 26 12/22/2018   TSH 0.78 12/14/2016   HGBA1C 5.5 06/16/2018   MICROALBUR 1.6 06/16/2017    Dg C-arm 1-60 Min-no Report  Result Date: 08/12/2018 Fluoroscopy was utilized by the requesting physician.  No radiographic interpretation.    Assessment & Plan:   Problem List Items Addressed This Visit    Osteoporosis    Prolia injection given today       Relevant Medications   denosumab (PROLIA) injection 60 mg (Completed)   Essential hypertension, benign   Hypovitaminosis D   Hyperlipidemia LDL goal <130    Managed with zetia.  LDL is at goal  lfts are normal.  No changes today   Lab Results  Component Value Date   CHOL 184 12/22/2018   HDL 49.30 12/22/2018   LDLCALC 111 (H) 12/22/2018   LDLDIRECT 103.0 06/16/2017   TRIG 120.0 12/22/2018   CHOLHDL 4 12/22/2018   Lab Results  Component Value Date   ALT 26 12/22/2018   AST 24 12/22/2018   ALKPHOS 73 12/22/2018   BILITOT 0.9 12/22/2018         Cough in adult    persistent for several weeks following an episode of bronchitis/sinusitis treated with ceftin in early march.  Chest x ray Is normal.  Cough  Suppressant         Other Visit Diagnoses    Cough    -  Primary   Relevant Orders   DG Chest 2 View (Completed)   Leukocytosis, unspecified type         A total of 25 minutes of face to face time was spent with patient more than half of which was spent in counselling about the above  mentioned conditions  and coordination of care  I am having Brenda Cardenas start on benzonatate. I am also having her maintain her calcium citrate-vitamin D, acetaminophen, Probiotic Product (ALIGN PO), Vitamin D3, Multiple Vitamins-Minerals (CENTRUM SILVER 50+WOMEN PO), CRANBERRY PO, mupirocin ointment, atenolol, omeprazole, ipratropium, and ezetimibe. We administered denosumab. We will continue to administer denosumab.  Meds ordered this encounter  Medications  . benzonatate (TESSALON) 200 MG capsule    Sig: Take 1 capsule (200 mg total) by mouth 3 (three) times daily as needed for cough.    Dispense:  60 capsule    Refill:  1  . denosumab (PROLIA) injection 60  mg    There are no discontinued medications.  Follow-up: No follow-ups on file.   Crecencio Mc, MD

## 2018-12-25 DIAGNOSIS — R05 Cough: Secondary | ICD-10-CM | POA: Insufficient documentation

## 2018-12-25 DIAGNOSIS — R059 Cough, unspecified: Secondary | ICD-10-CM | POA: Insufficient documentation

## 2018-12-25 NOTE — Assessment & Plan Note (Signed)
persistent for several weeks following an episode of bronchitis/sinusitis treated with ceftin in early march.  Chest x ray Is normal.  Cough  Suppressant

## 2018-12-25 NOTE — Assessment & Plan Note (Signed)
Prolia injection given today. 

## 2018-12-25 NOTE — Assessment & Plan Note (Signed)
Managed with zetia.  LDL is at goal  lfts are normal.  No changes today   Lab Results  Component Value Date   CHOL 184 12/22/2018   HDL 49.30 12/22/2018   LDLCALC 111 (H) 12/22/2018   LDLDIRECT 103.0 06/16/2017   TRIG 120.0 12/22/2018   CHOLHDL 4 12/22/2018   Lab Results  Component Value Date   ALT 26 12/22/2018   AST 24 12/22/2018   ALKPHOS 73 12/22/2018   BILITOT 0.9 12/22/2018

## 2019-02-02 ENCOUNTER — Ambulatory Visit: Payer: Medicare Other | Admitting: Podiatry

## 2019-02-10 ENCOUNTER — Other Ambulatory Visit: Payer: Self-pay | Admitting: Internal Medicine

## 2019-02-21 ENCOUNTER — Ambulatory Visit (INDEPENDENT_AMBULATORY_CARE_PROVIDER_SITE_OTHER): Payer: Medicare Other | Admitting: Vascular Surgery

## 2019-03-13 ENCOUNTER — Ambulatory Visit (INDEPENDENT_AMBULATORY_CARE_PROVIDER_SITE_OTHER): Payer: Medicare Other | Admitting: Internal Medicine

## 2019-03-13 ENCOUNTER — Ambulatory Visit: Payer: Medicare Other | Admitting: Internal Medicine

## 2019-03-13 ENCOUNTER — Other Ambulatory Visit: Payer: Self-pay

## 2019-03-13 ENCOUNTER — Encounter: Payer: Self-pay | Admitting: Internal Medicine

## 2019-03-13 DIAGNOSIS — Z7189 Other specified counseling: Secondary | ICD-10-CM | POA: Diagnosis not present

## 2019-03-13 DIAGNOSIS — H8110 Benign paroxysmal vertigo, unspecified ear: Secondary | ICD-10-CM | POA: Diagnosis not present

## 2019-03-13 NOTE — Patient Instructions (Signed)
I agree with Dr Tami Ribas that the atenolol may be contributing to your dizziness if it occurs when you stand up  Suspend the atenolol for a week.  If the symptoms continue to occur,  And your blood pressure becomes elevated,  Resume the atenolol  If the symptoms resolve,  But your pressure becomes elevated,  Let me know so I can prescribe an alternative

## 2019-03-13 NOTE — Progress Notes (Signed)
Telephone  Note  This visit type was conducted due to national recommendations for restrictions regarding the COVID-19 pandemic (e.g. social distancing).  This format is felt to be most appropriate for this patient at this time.  All issues noted in this document were discussed and addressed.  No physical exam was performed (except for noted visual exam findings with Video Visits).   I connected with@ on 03/13/19 at  3:30 PM EDT by  telephone and verified that I am speaking with the correct person using two identifiers. Location patient: home Location provider: work or home office Persons participating in the virtual visit: patient, provider  I discussed the limitations, risks, security and privacy concerns of performing an evaluation and management service by telephone and the availability of in person appointments. I also discussed with the patient that there may be a patient responsible charge related to this service. The patient expressed understanding and agreed to proceed.  Reason for visit: dizzy spells   HPI:  85 yr old with history of hypertension , osteoporosis presents with 2 week history of recurrent vertigo that is brought on by position change.  First episode occurred while getting her hair done,  As she stood up after being tilted back. Lasted about 5 minutes. subseuqent episodes occurring at home in then middle of the night.  Patient lives alone,  Is physically active.  Ears have felts under pressure but denies sinus congestion .  Was seen by ENT today and diagnosed with BPPV by ENT eval today after cerumen disimpaction and Epley Maneuver was performed for 45 to 60  minutes.    Also rec suspending atenolol   The patient has no signs or symptoms of COVID 19 infection (fever, cough, sore throat  or shortness of breath beyond what is typical for patient).  Patient denies contact with other persons with the above mentioned symptoms or with anyone confirmed to have COVID 19   ROS:  See pertinent positives and negatives per HPI.  Past Medical History:  Diagnosis Date  . Arthritis   . Chronic kidney disease   . Colon polyps   . Gastritis   . GERD (gastroesophageal reflux disease)   . Hiatal hernia   . Hyperlipidemia   . Hypertension   . IBS (irritable bowel syndrome)     Past Surgical History:  Procedure Laterality Date  . APPENDECTOMY  1959  . CARPAL TUNNEL RELEASE    . CATARACT EXTRACTION W/ INTRAOCULAR LENS IMPLANT Left   . CATARACT EXTRACTION W/PHACO Right 04/20/2018   Procedure: CATARACT EXTRACTION PHACO AND INTRAOCULAR LENS PLACEMENT ([place])  RIGHT;  Surgeon: Leandrew Koyanagi, MD;  Location: [facility];  Service: Ophthalmology;  Laterality: Right;  . CHOLECYSTECTOMY  1995  . GANGLION CYST EXCISION    . JOINT REPLACEMENT Right July 2009   [facility]   . KNEE ARTHROSCOPY    . LITHOTRIPSY    . SALIVARY GLAND SURGERY    . SKIN CANCER EXCISION    . TONSILLECTOMY    . TONSILLECTOMY AND ADENOIDECTOMY    . VEIN LIGATION      Family History  Problem Relation Age of Onset  . Heart disease Mother   . Arthritis Mother   . Diabetes Father     SOCIAL HX:  reports that she has never smoked. She has never used smokeless tobacco. She reports that she does not drink alcohol or use drugs.   Current Outpatient Medications:  .  acetaminophen (TYLENOL) 325 MG tablet, Take 650 mg by  mouth 2 (two) times daily. , Disp: , Rfl:  .  atenolol (TENORMIN) 25 MG tablet, TAKE ONE TABLET EVERY DAY, Disp: 90 tablet, Rfl: 1 .  benzonatate (TESSALON) 200 MG capsule, Take 1 capsule (200 mg total) by mouth 3 (three) times daily as needed for cough., Disp: 60 capsule, Rfl: 1 .  calcium citrate-vitamin D (CITRACAL+D) 315-200 MG-UNIT per tablet, Take 2 tablets by mouth daily., Disp: , Rfl:  .  Cholecalciferol (VITAMIN D3) 2000 units TABS, Take 2,000 Units by mouth daily. , Disp: , Rfl:  .  CRANBERRY PO, Take 2 capsules by mouth daily. , Disp: , Rfl:  .  ezetimibe (ZETIA) 10  MG tablet, TAKE ONE TABLET EVERY DAY, Disp: 90 tablet, Rfl: 1 .  ipratropium (ATROVENT) 0.06 % nasal spray, Place 2 sprays into both nostrils 4 (four) times daily., Disp: 15 mL, Rfl: 12 .  Multiple Vitamins-Minerals (CENTRUM SILVER 50+WOMEN PO), 1 tablet by mouth every day, Disp: , Rfl:  .  mupirocin ointment (BACTROBAN) 2 %, , Disp: , Rfl:  .  omeprazole (PRILOSEC) 20 MG capsule, TAKE 1 CAPSULE BY MOUTH DAILY, Disp: 90 capsule, Rfl: 1 .  Probiotic Product (ALIGN PO), Take 1 tablet by mouth daily. , Disp: , Rfl:   Current Facility-Administered Medications:  .  denosumab (PROLIA) injection 60 mg, 60 mg, Subcutaneous, Once, Einar Pheasant, MD  EXAM:  VITALS per patient if applicable:  GENERAL: alert, oriented, appears well and in no acute distress  HEENT: atraumatic, conjunttiva clear, no obvious abnormalities on inspection of external nose and ears  NECK: normal movements of the head and neck  LUNGS: on inspection no signs of respiratory distress, breathing rate appears normal, no obvious gross SOB, gasping or wheezing  CV: no obvious cyanosis  MS: moves all visible extremities without noticeable abnormality  PSYCH/NEURO: pleasant and cooperative, no obvious depression or anxiety, speech and thought processing grossly intact  ASSESSMENT AND PLAN:  Discussed the following assessment and plan:  BPPV (benign paroxysmal positional vertigo) Diagnosed by ENT with Epley Maneuver. Agree with suspension of atenolol as well as a trial   Educated About Covid-19 Virus Infection Educated patient on the newly broadened list of signs and symptoms of COVID-19 infection and ways to avoid the viral infection including washing hands frequently with soap and water,  using hand sanitizer if unable to wash, avoiding touching face,  staying at home and limiting visitors,  and avoiding contact with people coming in and out of home.  Discussed the potential ineffectiveness of hand sanitizer if left in  environments > 110 degrees (ie , the car).  Reminded patient to call office with questions/concerns.  The importance of continued social distancing was discussed today . Patient was screened for the development of any unsafe behaviors or habits that may have developed as a result of the social impact of the virus , including alcohol abuse,  Domestic violence, tobacco abuse and overeating.       I discussed the assessment and treatment plan with the patient. The patient was provided an opportunity to ask questions and all were answered. The patient agreed with the plan and demonstrated an understanding of the instructions.   The patient was advised to call back or seek an in-person evaluation if the symptoms worsen or if the condition fails to improve as anticipated.  I provided 30 minutes of non-face-to-face time during this encounter.   Crecencio Mc, MD

## 2019-03-14 DIAGNOSIS — Z7189 Other specified counseling: Secondary | ICD-10-CM | POA: Insufficient documentation

## 2019-03-14 DIAGNOSIS — H8110 Benign paroxysmal vertigo, unspecified ear: Secondary | ICD-10-CM | POA: Insufficient documentation

## 2019-03-14 LAB — HM MAMMOGRAPHY

## 2019-03-14 NOTE — Assessment & Plan Note (Addendum)
Diagnosed by ENT with Epley Maneuver. Agree with suspension of atenolol as well as a trial

## 2019-03-14 NOTE — Assessment & Plan Note (Signed)
Educated patient on the newly broadened list of signs and symptoms of COVID-19 infection and ways to avoid the viral infection including washing hands frequently with soap and water,  using hand sanitizer if unable to wash, avoiding touching face,  staying at home and limiting visitors,  and avoiding contact with people coming in and out of home.  Discussed the potential ineffectiveness of hand sanitizer if left in environments > 110 degrees (ie , the car).  Reminded patient to call office with questions/concerns.  The importance of continued social distancing was discussed today . Patient was screened for the development of any unsafe behaviors or habits that may have developed as a result of the social impact of the virus , including alcohol abuse,  Domestic violence, tobacco abuse and overeating.

## 2019-05-23 ENCOUNTER — Telehealth: Payer: Self-pay

## 2019-05-23 DIAGNOSIS — I10 Essential (primary) hypertension: Secondary | ICD-10-CM

## 2019-05-23 DIAGNOSIS — E785 Hyperlipidemia, unspecified: Secondary | ICD-10-CM

## 2019-05-23 NOTE — Telephone Encounter (Signed)
Pt is coming in for a 6 month follow up on 06/26/2019 and would like to have her blood work done prior to appt. I have ordered CMP and lipid panel. Is there anything else that needs to be ordered?

## 2019-05-23 NOTE — Telephone Encounter (Signed)
Copied from [place] [id number]. Topic: Appointment Scheduling - Scheduling Inquiry for Clinic >> May 23, 2019 10:26 AM Scherrie Gerlach wrote: Reason for CRM: pt states she is to repeat fasting labs prior to her appt 06/26/19.  Pt would like to do this 06/19/19.  There are no orders.  Please put in fasting lab orders.

## 2019-05-31 NOTE — Telephone Encounter (Signed)
Insurance verification for Prolia filed on [employer] Portal. 

## 2019-06-07 ENCOUNTER — Other Ambulatory Visit: Payer: Self-pay | Admitting: Internal Medicine

## 2019-06-09 ENCOUNTER — Ambulatory Visit (INDEPENDENT_AMBULATORY_CARE_PROVIDER_SITE_OTHER): Payer: Medicare Other

## 2019-06-09 ENCOUNTER — Other Ambulatory Visit: Payer: Self-pay

## 2019-06-09 DIAGNOSIS — Z23 Encounter for immunization: Secondary | ICD-10-CM

## 2019-06-09 NOTE — Telephone Encounter (Signed)
Pt has not received a response.

## 2019-06-09 NOTE — Telephone Encounter (Signed)
Labs are ordered. She needs a fasting lab appt.

## 2019-06-09 NOTE — Telephone Encounter (Signed)
Pt fwould like to have her prolia injection done on 06/19/2019.

## 2019-06-19 ENCOUNTER — Other Ambulatory Visit: Payer: Self-pay

## 2019-06-19 ENCOUNTER — Other Ambulatory Visit: Payer: Medicare Other

## 2019-06-19 ENCOUNTER — Other Ambulatory Visit (INDEPENDENT_AMBULATORY_CARE_PROVIDER_SITE_OTHER): Payer: Medicare Other

## 2019-06-19 ENCOUNTER — Telehealth: Payer: Self-pay | Admitting: Internal Medicine

## 2019-06-19 DIAGNOSIS — E785 Hyperlipidemia, unspecified: Secondary | ICD-10-CM

## 2019-06-19 DIAGNOSIS — I10 Essential (primary) hypertension: Secondary | ICD-10-CM

## 2019-06-19 LAB — LIPID PANEL
Cholesterol: 153 mg/dL (ref 0–200)
HDL: 45 mg/dL (ref >39.00)
LDL Cholesterol: 85 mg/dL (ref 0–99)
NonHDL: 107.54
Total CHOL/HDL Ratio: 3
Triglycerides: 111 mg/dL (ref 0.0–149.0)
VLDL: 22.2 mg/dL (ref 0.0–40.0)

## 2019-06-19 LAB — COMPREHENSIVE METABOLIC PANEL
ALT: 17 U/L (ref 0–35)
AST: 24 U/L (ref 0–37)
Albumin: 3.7 g/dL (ref 3.5–5.2)
Alkaline Phosphatase: 64 U/L (ref 39–117)
BUN: 19 mg/dL (ref 6–23)
CO2: 30 mEq/L (ref 19–32)
Calcium: 10.1 mg/dL (ref 8.4–10.5)
Chloride: 103 mEq/L (ref 96–112)
Creatinine, Ser: 0.78 mg/dL (ref 0.40–1.20)
GFR: 70.1 mL/min (ref >60.00)
Glucose, Bld: 103 mg/dL — ABNORMAL HIGH (ref 70–99)
Potassium: 4.2 mEq/L (ref 3.5–5.1)
Sodium: 139 mEq/L (ref 135–145)
Total Bilirubin: 0.7 mg/dL (ref 0.2–1.2)
Total Protein: 6.2 g/dL (ref 6.0–8.3)

## 2019-06-19 NOTE — Telephone Encounter (Signed)
Patient came in for labs today and stated that could not get enough blood from left hand so had to draw from right hand when patient arrived at home she took off the Coban dressing that was applied and she had a hematoma described as larger than a silver dollar, patient is not on any anticoagulation therapy per chart, advised to draw circle around area and to elevate at heart level but not above and to apply ice with protection between skin and ice for 15 minutes. Anything else should patient do ?

## 2019-06-19 NOTE — Telephone Encounter (Signed)
No,  There is nothing else that needs to be done for a bruise

## 2019-06-19 NOTE — Telephone Encounter (Signed)
called and advised patient of PCP agreement to use ice.

## 2019-06-26 ENCOUNTER — Ambulatory Visit: Payer: Medicare Other | Admitting: Internal Medicine

## 2019-06-27 ENCOUNTER — Other Ambulatory Visit: Payer: Self-pay

## 2019-06-27 ENCOUNTER — Ambulatory Visit (INDEPENDENT_AMBULATORY_CARE_PROVIDER_SITE_OTHER): Payer: Medicare Other | Admitting: *Deleted

## 2019-06-27 DIAGNOSIS — M810 Age-related osteoporosis without current pathological fracture: Secondary | ICD-10-CM | POA: Diagnosis not present

## 2019-06-28 MED ORDER — DENOSUMAB 60 MG/ML ~~LOC~~ SOSY
60.0000 mg | PREFILLED_SYRINGE | Freq: Once | SUBCUTANEOUS | Status: AC
  Administered 2019-06-27: 60 mg via SUBCUTANEOUS

## 2019-06-28 NOTE — Progress Notes (Addendum)
Patient presented for Prolia injection to Right arm [place], patient voiced no concerns or complaints during or after injection.INjection given on 06/27/19 late documentation.  Reviewed.  Dr Nicki Reaper

## 2019-07-03 ENCOUNTER — Other Ambulatory Visit: Payer: Self-pay

## 2019-07-05 ENCOUNTER — Ambulatory Visit (INDEPENDENT_AMBULATORY_CARE_PROVIDER_SITE_OTHER): Payer: Medicare Other | Admitting: Internal Medicine

## 2019-07-05 ENCOUNTER — Encounter: Payer: Self-pay | Admitting: Internal Medicine

## 2019-07-05 ENCOUNTER — Other Ambulatory Visit: Payer: Self-pay

## 2019-07-05 DIAGNOSIS — H8110 Benign paroxysmal vertigo, unspecified ear: Secondary | ICD-10-CM

## 2019-07-05 DIAGNOSIS — M4125 Other idiopathic scoliosis, thoracolumbar region: Secondary | ICD-10-CM | POA: Diagnosis not present

## 2019-07-05 DIAGNOSIS — M810 Age-related osteoporosis without current pathological fracture: Secondary | ICD-10-CM

## 2019-07-05 DIAGNOSIS — I10 Essential (primary) hypertension: Secondary | ICD-10-CM

## 2019-07-05 DIAGNOSIS — Z7189 Other specified counseling: Secondary | ICD-10-CM

## 2019-07-05 DIAGNOSIS — E785 Hyperlipidemia, unspecified: Secondary | ICD-10-CM | POA: Diagnosis not present

## 2019-07-05 NOTE — Patient Instructions (Signed)
You are doing very well!1  See yo u in 6 months

## 2019-07-05 NOTE — Progress Notes (Signed)
Subjective:  Patient ID: Brenda Cardenas, female    DOB: [birth year]  Age: 85 y.o. MRN: [id number]  CC: There were no encounter diagnoses.  HPI [name] presents for 6 month follow up   Getting prolia last injection sept 29  Labs normal sept 22   Back pain chronic tolerable during the day worse by nightfall  Using 4 RS tylenol daily   Still restricted from having visitors in home due to COVID 19   Using walking sticks in each hand  That keep her back upright.  Walking nearly daily   No falls.    no insomnia   Left foot /ankle/hamstring/back syndrome .   Had an attack of BPV  during  3 hour root canal In July  sees [facility] , had the epley maneuver on previous episode   Outpatient Medications Prior to Visit  Medication Sig Dispense Refill  . acetaminophen (TYLENOL) 325 MG tablet Take 650 mg by mouth 2 (two) times daily.     Marland Kitchen atenolol (TENORMIN) 25 MG tablet TAKE ONE TABLET BY MOUTH EVERY DAY 90 tablet 1  . calcium citrate-vitamin D (CITRACAL+D) 315-200 MG-UNIT per tablet Take 2 tablets by mouth daily.    . Cholecalciferol (VITAMIN D3) 2000 units TABS Take 2,000 Units by mouth daily.     Marland Kitchen CRANBERRY PO Take 2 capsules by mouth daily.     Marland Kitchen ezetimibe (ZETIA) 10 MG tablet TAKE ONE TABLET EVERY DAY 90 tablet 1  . Multiple Vitamins-Minerals (CENTRUM SILVER 50+WOMEN PO) 1 tablet by mouth every day    . mupirocin ointment (BACTROBAN) 2 %     . omeprazole (PRILOSEC) 20 MG capsule TAKE 1 CAPSULE BY MOUTH DAILY 90 capsule 1  . Probiotic Product (ALIGN PO) Take 1 tablet by mouth daily.     . benzonatate (TESSALON) 200 MG capsule Take 1 capsule (200 mg total) by mouth 3 (three) times daily as needed for cough. 60 capsule 1  . ipratropium (ATROVENT) 0.06 % nasal spray Place 2 sprays into both nostrils 4 (four) times daily. 15 mL 12   Facility-Administered Medications Prior to Visit  Medication Dose Route Frequency Provider Last Rate Last Dose  . denosumab (PROLIA) injection 60 mg   60 mg Subcutaneous Once Einar Pheasant, MD        Review of Systems;  Patient denies headache, fevers, malaise, unintentional weight loss, skin rash, eye pain, sinus congestion and sinus pain, sore throat, dysphagia,  hemoptysis , cough, dyspnea, wheezing, chest pain, palpitations, orthopnea, edema, abdominal pain, nausea, melena, diarrhea, constipation, flank pain, dysuria, hematuria, urinary  Frequency, nocturia, numbness, tingling, seizures,  Focal weakness, Loss of consciousness,  Tremor, insomnia, depression, anxiety, and suicidal ideation.      Objective:  BP 122/68 (BP Location: Left Arm, Patient Position: Sitting, Cuff Size: Normal)   Pulse 96   Temp (!) 97.5 F (36.4 C) (Temporal)   Resp 15   Ht 5\' 6"  (1.676 m)   Wt 145 lb (65.8 kg)   SpO2 96%   BMI 23.40 kg/m   BP Readings from Last 3 Encounters:  07/05/19 122/68  12/22/18 138/82  08/12/18 (!) 168/77    Wt Readings from Last 3 Encounters:  07/05/19 145 lb (65.8 kg)  12/22/18 145 lb (65.8 kg)  08/12/18 147 lb (66.7 kg)    General appearance: alert, cooperative and appears stated age Ears: normal TM's and external ear canals both ears Throat: lips, mucosa, and tongue normal; teeth and gums normal Neck: no adenopathy,  no carotid bruit, supple, symmetrical, trachea midline and thyroid not enlarged, symmetric, no tenderness/mass/nodules Back: symmetric, no curvature. ROM normal. No CVA tenderness. Lungs: clear to auscultation bilaterally Heart: regular rate and rhythm, S1, S2 normal, no murmur, click, rub or gallop Abdomen: soft, non-tender; bowel sounds normal; no masses,  no organomegaly Pulses: 2+ and symmetric Skin: Skin color, texture, turgor normal. No rashes or lesions Lymph nodes: Cervical, supraclavicular, and axillary nodes normal.  Lab Results  Component Value Date   HGBA1C 5.5 06/16/2018   HGBA1C 5.7 03/24/2018   HGBA1C 5.5 06/16/2017    Lab Results  Component Value Date   CREATININE 0.78  06/19/2019   CREATININE 0.84 12/22/2018   CREATININE 0.79 06/16/2018    Lab Results  Component Value Date   WBC 7.9 12/22/2018   HGB 12.4 12/22/2018   HCT 36.8 12/22/2018   PLT 161.0 12/22/2018   GLUCOSE 103 (H) 06/19/2019   CHOL 153 06/19/2019   TRIG 111.0 06/19/2019   HDL 45.00 06/19/2019   LDLDIRECT 103.0 06/16/2017   LDLCALC 85 06/19/2019   ALT 17 06/19/2019   AST 24 06/19/2019   NA 139 06/19/2019   K 4.2 06/19/2019   CL 103 06/19/2019   CREATININE 0.78 06/19/2019   BUN 19 06/19/2019   CO2 30 06/19/2019   TSH 0.78 12/14/2016   HGBA1C 5.5 06/16/2018   MICROALBUR 1.6 06/16/2017    Dg C-arm 1-60 Min-no Report  Result Date: 08/12/2018 Fluoroscopy was utilized by the requesting physician.  No radiographic interpretation.    Assessment & Plan:   Problem List Items Addressed This Visit    None      I have discontinued [name] "Debarah Crape. Manganello"'s ipratropium and benzonatate. I am also having her maintain her calcium citrate-vitamin D, acetaminophen, Probiotic Product (ALIGN PO), Vitamin D3, Multiple Vitamins-Minerals (CENTRUM SILVER 50+WOMEN PO), CRANBERRY PO, mupirocin ointment, ezetimibe, omeprazole, and atenolol. We will continue to administer denosumab.  No orders of the defined types were placed in this encounter.   Medications Discontinued During This Encounter  Medication Reason  . ipratropium (ATROVENT) 0.06 % nasal spray Patient has not taken in last 30 days  . benzonatate (TESSALON) 200 MG capsule Patient has not taken in last 30 days  A total of 25 minutes of face to face time was spent with patient more than half of which was spent in counselling about the above mentioned conditions  and coordination of care  Follow-up: No follow-ups on file.   Crecencio Mc, MD

## 2019-07-07 NOTE — Assessment & Plan Note (Signed)
Complicated by spinal stenosis Causing chronic pain, managed with ESI and trigger point injections prn by Dr Andree Elk.  Aggravated by use of her left foot brace. She is encouraged to increase her use of tylenol to 2000 mg daily .  She is IADL

## 2019-07-07 NOTE — Assessment & Plan Note (Signed)
Managed with Prolia since 2016.  Last Prolia injection given  Last month.  No history of fractures.  Last DEXA was done in 2017 showed improvement in hip t scores.

## 2019-07-07 NOTE — Assessment & Plan Note (Signed)
Managed with zetia.  LDL is at goal  lfts are normal.  No changes today   Lab Results  Component Value Date   CHOL 153 06/19/2019   HDL 45.00 06/19/2019   LDLCALC 85 06/19/2019   LDLDIRECT 103.0 06/16/2017   TRIG 111.0 06/19/2019   CHOLHDL 3 06/19/2019   Lab Results  Component Value Date   ALT 17 06/19/2019   AST 24 06/19/2019   ALKPHOS 64 06/19/2019   BILITOT 0.7 06/19/2019

## 2019-07-07 NOTE — Assessment & Plan Note (Signed)
Well controlled on current regimen. Renal function stable, no changes today.  Lab Results  Component Value Date   CREATININE 0.78 06/19/2019

## 2019-07-07 NOTE — Assessment & Plan Note (Signed)

## 2019-07-07 NOTE — Assessment & Plan Note (Signed)
Diagnosed by ENT with Epley Maneuver. Periodic flares have been self limited

## 2019-07-31 ENCOUNTER — Telehealth: Payer: Self-pay

## 2019-07-31 NOTE — Telephone Encounter (Signed)
Left message for patient to return my call.

## 2019-07-31 NOTE — Telephone Encounter (Signed)
Returned cal to patient and advised I did my steps to attain approval for Prolia she would need to call insurance.

## 2019-07-31 NOTE — Telephone Encounter (Signed)
Copied from [place] [id number]. Topic: General - Other >> Jul 31, 2019 10:12 AM Carolyn Stare wrote: Pt would like a call back about a Prolia injection she had in Sept   req Kerin Salen to call her back

## 2019-08-17 ENCOUNTER — Other Ambulatory Visit: Payer: Self-pay | Admitting: Internal Medicine

## 2019-08-28 ENCOUNTER — Ambulatory Visit: Payer: Medicare Other

## 2019-08-28 ENCOUNTER — Ambulatory Visit: Payer: Medicare Other | Admitting: Internal Medicine

## 2019-09-08 ENCOUNTER — Other Ambulatory Visit: Payer: Self-pay | Admitting: Internal Medicine

## 2019-10-03 ENCOUNTER — Other Ambulatory Visit: Payer: Self-pay | Admitting: Internal Medicine

## 2019-11-13 ENCOUNTER — Telehealth: Payer: Self-pay | Admitting: Internal Medicine

## 2019-11-13 NOTE — Telephone Encounter (Signed)
Insurance verified and has not changed and Appointment scheduled.

## 2019-11-13 NOTE — Telephone Encounter (Signed)
Need to verify patient has same  Insurance. [employer] state health plan. State Plan has changed to [facility] will need copy of new card  to file for Prolia.

## 2019-12-27 ENCOUNTER — Ambulatory Visit (INDEPENDENT_AMBULATORY_CARE_PROVIDER_SITE_OTHER): Payer: Medicare Other

## 2019-12-27 ENCOUNTER — Other Ambulatory Visit: Payer: Self-pay

## 2019-12-27 ENCOUNTER — Telehealth: Payer: Self-pay | Admitting: Internal Medicine

## 2019-12-27 DIAGNOSIS — R5383 Other fatigue: Secondary | ICD-10-CM

## 2019-12-27 DIAGNOSIS — I10 Essential (primary) hypertension: Secondary | ICD-10-CM

## 2019-12-27 DIAGNOSIS — M810 Age-related osteoporosis without current pathological fracture: Secondary | ICD-10-CM | POA: Diagnosis not present

## 2019-12-27 DIAGNOSIS — D72829 Elevated white blood cell count, unspecified: Secondary | ICD-10-CM

## 2019-12-27 DIAGNOSIS — E785 Hyperlipidemia, unspecified: Secondary | ICD-10-CM

## 2019-12-27 DIAGNOSIS — R7301 Impaired fasting glucose: Secondary | ICD-10-CM

## 2019-12-27 MED ORDER — DENOSUMAB 60 MG/ML ~~LOC~~ SOSY
60.0000 mg | PREFILLED_SYRINGE | Freq: Once | SUBCUTANEOUS | Status: AC
  Administered 2019-12-27: 09:00:00 60 mg via SUBCUTANEOUS

## 2019-12-27 NOTE — Progress Notes (Signed)
Patient presented for 6-month Prolia injection SQ to right arm. Patient tolerated well. 

## 2019-12-27 NOTE — Telephone Encounter (Signed)
PT wants lab work ordered before appt in [place] too

## 2019-12-27 NOTE — Telephone Encounter (Signed)
Pt would like to have labs prior to her appt on 01/11/2020. I have ordered CmP, lipid, A1c, TSH, CBC and microalbumin. Is there anything else that needs to be ordered?

## 2019-12-27 NOTE — Telephone Encounter (Signed)
Left message for patient to call back and schedule Medicare Annual Wellness Visit (AWV) either virtually or audio only.  Last AWV 09/24/17; please schedule at anytime with Denisa O'Brien-Blaney at [facility].

## 2020-01-05 ENCOUNTER — Other Ambulatory Visit: Payer: Self-pay

## 2020-01-05 ENCOUNTER — Other Ambulatory Visit (INDEPENDENT_AMBULATORY_CARE_PROVIDER_SITE_OTHER): Payer: Medicare Other

## 2020-01-05 DIAGNOSIS — R7301 Impaired fasting glucose: Secondary | ICD-10-CM

## 2020-01-05 DIAGNOSIS — I10 Essential (primary) hypertension: Secondary | ICD-10-CM | POA: Diagnosis not present

## 2020-01-05 DIAGNOSIS — R5383 Other fatigue: Secondary | ICD-10-CM

## 2020-01-05 DIAGNOSIS — D72829 Elevated white blood cell count, unspecified: Secondary | ICD-10-CM | POA: Diagnosis not present

## 2020-01-05 DIAGNOSIS — E785 Hyperlipidemia, unspecified: Secondary | ICD-10-CM

## 2020-01-05 LAB — COMPREHENSIVE METABOLIC PANEL
ALT: 14 U/L (ref 0–35)
AST: 17 U/L (ref 0–37)
Albumin: 3.9 g/dL (ref 3.5–5.2)
Alkaline Phosphatase: 66 U/L (ref 39–117)
BUN: 20 mg/dL (ref 6–23)
CO2: 27 mEq/L (ref 19–32)
Calcium: 9.3 mg/dL (ref 8.4–10.5)
Chloride: 107 mEq/L (ref 96–112)
Creatinine, Ser: 0.74 mg/dL (ref 0.40–1.20)
GFR: 74.4 mL/min (ref >60.00)
Glucose, Bld: 95 mg/dL (ref 70–99)
Potassium: 4 mEq/L (ref 3.5–5.1)
Sodium: 141 mEq/L (ref 135–145)
Total Bilirubin: 0.7 mg/dL (ref 0.2–1.2)
Total Protein: 6.2 g/dL (ref 6.0–8.3)

## 2020-01-05 LAB — CBC WITH DIFFERENTIAL/PLATELET
Basophils Absolute: 0.1 10*3/uL (ref 0.0–0.1)
Basophils Relative: 0.9 % (ref 0.0–3.0)
Eosinophils Absolute: 0.4 10*3/uL (ref 0.0–0.7)
Eosinophils Relative: 4.2 % (ref 0.0–5.0)
HCT: 35.7 % — ABNORMAL LOW (ref 36.0–46.0)
Hemoglobin: 12 g/dL (ref 12.0–15.0)
Lymphocytes Relative: 30.4 % (ref 12.0–46.0)
Lymphs Abs: 2.8 10*3/uL (ref 0.7–4.0)
MCHC: 33.5 g/dL (ref 30.0–36.0)
MCV: 97.9 fl (ref 78.0–100.0)
Monocytes Absolute: 0.4 10*3/uL (ref 0.1–1.0)
Monocytes Relative: 4.8 % (ref 3.0–12.0)
Neutro Abs: 5.4 10*3/uL (ref 1.4–7.7)
Neutrophils Relative %: 59.7 % (ref 43.0–77.0)
Platelets: 161 10*3/uL (ref 150.0–400.0)
RBC: 3.65 Mil/uL — ABNORMAL LOW (ref 3.87–5.11)
RDW: 19.7 % — ABNORMAL HIGH (ref 11.5–15.5)
WBC: 9.1 10*3/uL (ref 4.0–10.5)

## 2020-01-05 LAB — LIPID PANEL
Cholesterol: 177 mg/dL (ref 0–200)
HDL: 50.2 mg/dL (ref >39.00)
LDL Cholesterol: 104 mg/dL — ABNORMAL HIGH (ref 0–99)
NonHDL: 127.02
Total CHOL/HDL Ratio: 4
Triglycerides: 117 mg/dL (ref 0.0–149.0)
VLDL: 23.4 mg/dL (ref 0.0–40.0)

## 2020-01-05 LAB — MICROALBUMIN / CREATININE URINE RATIO
Creatinine,U: 82 mg/dL
Microalb Creat Ratio: 1.2 mg/g (ref 0.0–30.0)
Microalb, Ur: 1 mg/dL (ref 0.0–1.9)

## 2020-01-05 LAB — TSH: TSH: 0.71 u[IU]/mL (ref 0.35–4.50)

## 2020-01-05 LAB — HEMOGLOBIN A1C: Hgb A1c MFr Bld: 5.4 % (ref 4.6–6.5)

## 2020-01-09 ENCOUNTER — Other Ambulatory Visit: Payer: Self-pay

## 2020-01-10 ENCOUNTER — Other Ambulatory Visit: Payer: Self-pay | Admitting: Internal Medicine

## 2020-01-11 ENCOUNTER — Other Ambulatory Visit: Payer: Self-pay

## 2020-01-11 ENCOUNTER — Encounter: Payer: Self-pay | Admitting: Internal Medicine

## 2020-01-11 ENCOUNTER — Ambulatory Visit (INDEPENDENT_AMBULATORY_CARE_PROVIDER_SITE_OTHER): Payer: Medicare Other | Admitting: Internal Medicine

## 2020-01-11 DIAGNOSIS — E785 Hyperlipidemia, unspecified: Secondary | ICD-10-CM | POA: Diagnosis not present

## 2020-01-11 DIAGNOSIS — I10 Essential (primary) hypertension: Secondary | ICD-10-CM

## 2020-01-11 DIAGNOSIS — M4125 Other idiopathic scoliosis, thoracolumbar region: Secondary | ICD-10-CM

## 2020-01-11 DIAGNOSIS — M48061 Spinal stenosis, lumbar region without neurogenic claudication: Secondary | ICD-10-CM

## 2020-01-11 DIAGNOSIS — K2100 Gastro-esophageal reflux disease with esophagitis, without bleeding: Secondary | ICD-10-CM

## 2020-01-11 DIAGNOSIS — M810 Age-related osteoporosis without current pathological fracture: Secondary | ICD-10-CM

## 2020-01-11 MED ORDER — HYOSCYAMINE SULFATE 0.125 MG PO TBDP: 0.1250 mg | ORAL_TABLET | Freq: Four times a day (QID) | ORAL | 0 refills | Status: AC | PRN

## 2020-01-11 NOTE — Patient Instructions (Addendum)
You are Healthy!   Enjoy your life!!!   SalonPas with lidocaine patches,  Use as needed for back pain    Hyoscyamine sublingual tablet for next episode of esophageal spasm    Esophageal Spasm  An esophageal spasm is a sudden tightening (contraction) of the part of the body that moves food from the mouth to the stomach (esophagus). Normally, smooth, wave-like muscle contractions move food and liquids down the esophagus. Esophageal spasms are abnormal muscle contractions that can cause chest pain and trouble swallowing (dysphagia). Spasms may also cause swallowed foods or liquids to come back up into the throat (regurgitation). There are two types of esophageal spasms. You may have one or both types:  Diffuse esophageal spasms. These are irregular, uncoordinated spasms. This type tends to cause more dysphagia.  Nutcracker esophagus. This is a type of spasm in which the muscles move normally, but the contraction is very strong. This type tends to be more painful. Severe esophageal spasms can make it hard to eat and do everyday activities. They often occur with severe heartburn (reflux esophagitis). The symptoms can come and go and may be triggered or worsened depending on your diet or other medical issues. What are the causes? The cause of esophageal spasms is not known. What increases the risk? The following factors may make you more likely to develop esophageal spasms:  Being female.  Age. The risk may increase as you get older.  Depression or anxiety.  Having GERD (gastroesophageal reflux disease). What are the signs or symptoms? Symptoms may vary from day to day. They may be mild or severe. They may last for minutes or hours. Common symptoms include:  Chest pain. This may feel like a heart attack.  Back pain.  Dysphagia.  Heartburn.  A feeling that something is stuck in the throat (globus).  Regurgitation of foods or liquids. For some people, certain things may trigger  symptoms, such as:  Certain foods and drinks. These may include very hot or very cold foods or drinks.  Eating very quickly. How is this diagnosed? This condition may be diagnosed based on your symptoms and a physical exam. You may have tests, such as:  Endoscopy. This involves using a flexible tube that has a camera on the end of it (endoscope) to look down your throat and examine your esophagus.  Barium swallow. This involves drinking a substance that will show up well on X-rays (barium) and then having X-rays to see how the substance moves through your esophagus.  Esophageal manometry. This involves passing a small, thin tube through your nose and down into your throat. The tube contains pressure sensors that measure muscle contractions in the esophagus while you swallow. How is this treated? Mild esophageal spasms may not need treatment. You may be able to manage the spasms by avoiding triggers. For more frequent or severe spasms, treatment may include:  Medicine to: ? Relax the esophageal muscles. ? Relieve muscle spasms (calcium channel blockers and nitrates). ? Relieve pain by blocking nerve endings in the esophagus. This is done with an injection of a toxin (botulinum). ? Relieve heartburn (proton pump inhibitors).  Antidepressant medicines. These are sometimes used to ease symptoms.  Surgery to reduce esophageal muscle contractions (myotomy), in very severe cases. Follow these instructions at home: Eating and drinking  Keep track of foods, drinks, and habits that trigger spasms or heartburn. Avoid these triggers as much as you can.  Eat meals slowly. Chew food completely before swallowing.  Avoid swallowing foods  and drinks when they are very hot or very cold. General instructions  Take over-the-counter and prescription medicines only as told by your health care provider.  Find ways to manage stress, such as regular exercise or meditation.  If you struggle with  depression or anxiety, talk with your health care provider about treatment options.  Keep all follow-up visits as told by your health care provider. This is important. Contact a health care provider if:  Your symptoms get worse or do not get better with medicine.  You are losing weight because of dysphagia.  Your esophageal spasms affect your quality of life, such as your ability to eat. Get help right away if:  You have severe chest pain.  You have chest pain that is different from your usual chest pain.  You have trouble breathing.  You choke. Summary  An esophageal spasm is a sudden tightening (contraction) of the part of the body that moves food from the mouth to the stomach (esophagus). These abnormal muscle contractions can cause chest pain and trouble swallowing (dysphagia).  The cause of esophageal spasms is not known.  Treatment may not be needed for mild spasms. For frequent or more severe spasms, treatment may include medicine, or, for very severe spasms, surgery.  Keep track of foods, drinks, and habits that trigger spasms or heartburn. Avoid these triggers as much as you can. This information is not intended to replace advice given to you by your health care provider. Make sure you discuss any questions you have with your health care provider. Document Revised: 08/27/2017 Document Reviewed: 06/15/2017 Elsevier Patient Education  2020 [employer].

## 2020-01-11 NOTE — Progress Notes (Signed)
Subjective:  Patient ID: Brenda Cardenas, female    DOB: [birth year]  Age: 86 y.o. MRN: [id number]  CC: Diagnoses of Spinal stenosis of lumbar region without neurogenic claudication, Essential hypertension, benign, Hyperlipidemia LDL goal <130, Other idiopathic scoliosis, thoracolumbar region, Age related osteoporosis, unspecified pathological fracture presence, and Gastroesophageal reflux disease with esophagitis without hemorrhage were pertinent to this visit.  HPI Brenda Cardenas presents for follow up on chronic conditions.   This visit occurred during the SARS-CoV-2 public health emergency.  Safety protocols were in place, including screening questions prior to the visit, additional usage of staff PPE, and extensive cleaning of exam room while observing appropriate contact time as indicated for disinfecting solutions.    Patient has received both doses of the [place] 19 vaccine without complications.  Patient continues to mask when outside of the home except when walking in yard or at safe distances from others .  Patient denies any change in mood or development of unhealthy behaviors resuting from the pandemic's restriction of activities and socialization.    Lives at [facility],  Restrictions have loosened slightly and she is allowed to have visitors.  She is allowing only vaccinated guests and workers.   1) chronic back pain :  Due to severe scoliosis and DDD.  Had ESI starting in 2009 ,  Stopped  Having them 1.5 years ago due to dimiishing returns.  Pain controlled  Using tylenol 2000 mg daily. .  Does not want pain cinic refereral or narcotics.  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  Dr. Alice Reichert called,  Esophageal spasm  diagnosed , pain lasted several hours .  History of GERD, sleeping on wedge for management of  GERD/hiatal hernia   5) Hypertension: patient checks blood pressure twice weekly at home.  Readings have been for the most part < 130/80 at rest . Patient is following a reducedsalt diet most days and is taking medications as prescribed    Outpatient Medications Prior to Visit  Medication Sig Dispense Refill  . acetaminophen (TYLENOL) 325 MG tablet Take 650 mg by mouth 2 (two) times daily.     Marland Kitchen atenolol (TENORMIN) 25 MG tablet TAKE ONE TABLET EVERY DAY 90 tablet 1  . calcium citrate-vitamin D (CITRACAL+D) 315-200 MG-UNIT per tablet Take 2 tablets by mouth daily.    . Cholecalciferol (VITAMIN D3) 2000 units TABS Take 2,000 Units by mouth daily.     Marland Kitchen CRANBERRY PO Take 2 capsules by mouth daily.     Marland Kitchen ezetimibe (ZETIA) 10 MG tablet TAKE 1 TABLET BY MOUTH DAILY 90 tablet 1  . Multiple Vitamins-Minerals (CENTRUM SILVER 50+WOMEN PO) 1 tablet by mouth every day    . mupirocin ointment (BACTROBAN) 2 %     . omeprazole (PRILOSEC) 20 MG capsule TAKE 1 CAPSULE BY MOUTH EVERY DAY 90 capsule 1  . Probiotic Product (ALIGN PO) Take 1 tablet by mouth daily.      Facility-Administered Medications Prior to Visit  Medication Dose Route Frequency Provider Last Rate Last Admin  . denosumab (PROLIA) injection 60 mg  60 mg Subcutaneous Once Einar Pheasant, MD        Review of Systems;  Patient denies headache, fevers,  malaise, unintentional weight loss, skin rash, eye pain, sinus congestion and sinus pain, sore throat, dysphagia,  hemoptysis , cough, dyspnea, wheezing, chest pain, palpitations, orthopnea, edema, abdominal pain, nausea, melena, diarrhea, constipation, flank pain, dysuria, hematuria, urinary  Frequency, nocturia, numbness, tingling, seizures,  Focal weakness, Loss of consciousness,  Tremor, insomnia, depression, anxiety, and suicidal ideation.      Objective:  BP 122/60 (BP Location: Left Arm,  Patient Position: Sitting, Cuff Size: Normal)   Pulse 65   Temp (!) 96.9 F (36.1 C) (Temporal)   Resp 15   Ht 5\' 6"  (1.676 m)   Wt 145 lb (65.8 kg)   SpO2 99%   BMI 23.40 kg/m   BP Readings from Last 3 Encounters:  01/11/20 122/60  07/05/19 122/68  12/22/18 138/82    Wt Readings from Last 3 Encounters:  01/11/20 145 lb (65.8 kg)  07/05/19 145 lb (65.8 kg)  12/22/18 145 lb (65.8 kg)    General appearance: alert, cooperative and appears stated age Ears: normal TM's and external ear canals both ears Throat: lips, mucosa, and tongue normal; teeth and gums normal Neck: no adenopathy, no carotid bruit, supple, symmetrical, trachea midline and thyroid not enlarged, symmetric, no tenderness/mass/nodules Back: symmetric, no curvature. ROM normal. No CVA tenderness. Lungs: clear to auscultation bilaterally Heart: regular rate and rhythm, S1, S2 normal, no murmur, click, rub or gallop Abdomen: soft, non-tender; bowel sounds normal; no masses,  no organomegaly Pulses: 2+ and symmetric Skin: Skin color, texture, turgor normal. No rashes or lesions Lymph nodes: Cervical, supraclavicular, and axillary nodes normal.  Lab Results  Component Value Date   HGBA1C 5.4 01/05/2020   HGBA1C 5.5 06/16/2018   HGBA1C 5.7 03/24/2018    Lab Results  Component Value Date   CREATININE 0.74 01/05/2020   CREATININE 0.78 06/19/2019   CREATININE 0.84 12/22/2018    Lab Results  Component Value Date   WBC 9.1 01/05/2020   HGB 12.0 01/05/2020   HCT 35.7 (L) 01/05/2020   PLT 161.0 01/05/2020   GLUCOSE 95 01/05/2020   CHOL 177 01/05/2020   TRIG 117.0 01/05/2020   HDL 50.20 01/05/2020   LDLDIRECT 103.0 06/16/2017   LDLCALC 104 (H) 01/05/2020   ALT 14 01/05/2020   AST 17 01/05/2020   NA 141 01/05/2020   K 4.0 01/05/2020   CL 107 01/05/2020   CREATININE 0.74 01/05/2020   BUN 20 01/05/2020   CO2 27 01/05/2020   TSH 0.71 01/05/2020   HGBA1C 5.4 01/05/2020   MICROALBUR 1.0 01/05/2020     DG C-Arm 1-60 Min-No Report  Result Date: 08/12/2018 Fluoroscopy was utilized by the requesting physician.  No radiographic interpretation.    Assessment & Plan:   Problem List Items Addressed This Visit      Unprioritized   GERD (gastroesophageal reflux disease)    Adding hyoscyamine for prn use given recent episode of spasm       Relevant Medications   hyoscyamine (ANASPAZ) 0.125 MG TBDP disintergrating tablet   Osteoporosis    Managed with Prolia since 2016.  Marland Kitchen  No history of fractures.  Last DEXA was done in 2017 showed improvement in hip t scores.      Relevant Orders   DG Bone Density   Idiopathic scoliosis    Complicated by spinal stenosis Causing chronic pain, previously managed with ESI and trigger point injections prn by Dr Andree Elk.  Aggravated by use of her left foot brace. She is encouraged to increase her use of tylenol to  2000 mg daily .  She is IADL      Spinal stenosis of lumbar region    Causing chronic pain, managed previously with ESI and trigger point injections prn by Dr Andree Elk until 2020.   Aggravated by use of her left foot brace.  She is encouraged to increase her use of tylenol to 2000 mg daily .  She is IADL      Essential hypertension, benign    Well controlled on current regimen. Renal function stable, no changes today.  Lab Results  Component Value Date   CREATININE 0.74 01/05/2020   BUN 20 01/05/2020   NA 141 01/05/2020   K 4.0 01/05/2020   CL 107 01/05/2020   CO2 27 01/05/2020   Lab Results  Component Value Date   NA 141 01/05/2020   K 4.0 01/05/2020   CL 107 01/05/2020   CO2 27 01/05/2020         Hyperlipidemia LDL goal <130    Managed with zetia.  LDL is at goal  lfts are normal.  No changes today   Lab Results  Component Value Date   CHOL 177 01/05/2020   HDL 50.20 01/05/2020   LDLCALC 104 (H) 01/05/2020   LDLDIRECT 103.0 06/16/2017   TRIG 117.0 01/05/2020   CHOLHDL 4 01/05/2020   Lab Results  Component Value Date    ALT 14 01/05/2020   AST 17 01/05/2020   ALKPHOS 66 01/05/2020   BILITOT 0.7 01/05/2020            I am having [name] "Arlyss Queen" start on hyoscyamine. I am also having her maintain her calcium citrate-vitamin D, acetaminophen, Probiotic Product (ALIGN PO), Vitamin D3, Multiple Vitamins-Minerals (CENTRUM SILVER 50+WOMEN PO), CRANBERRY PO, mupirocin ointment, omeprazole, atenolol, and ezetimibe. We will continue to administer denosumab.  Meds ordered this encounter  Medications  . hyoscyamine (ANASPAZ) 0.125 MG TBDP disintergrating tablet    Sig: Place 1 tablet (0.125 mg total) under the tongue every 6 (six) hours as needed (as needed for esophageal spasm).    Dispense:  30 tablet    Refill:  0   I provided  30 minutes of  face-to-face time during this encounter reviewing patient's current problems and past surgeries, labs and imaging studies, providing counseling on the above mentioned problems , and coordination  of care .  There are no discontinued medications.  Follow-up: Return in about 6 months (around 07/12/2020).   Crecencio Mc, MD

## 2020-01-13 NOTE — Assessment & Plan Note (Signed)
Managed with Prolia since 2016.  Marland Kitchen  No history of fractures.  Last DEXA was done in 2017 showed improvement in hip t scores.

## 2020-01-13 NOTE — Assessment & Plan Note (Signed)
Complicated by spinal stenosis Causing chronic pain, previously managed with ESI and trigger point injections prn by Dr Andree Elk.  Aggravated by use of her left foot brace. She is encouraged to increase her use of tylenol to 2000 mg daily .  She is IADL

## 2020-01-13 NOTE — Assessment & Plan Note (Addendum)
Causing chronic pain, managed previously with ESI and trigger point injections prn by Dr Andree Elk until 2020.   Aggravated by use of her left foot brace.  She is encouraged to increase her use of tylenol to 2000 mg daily .  She is IADL

## 2020-01-13 NOTE — Assessment & Plan Note (Signed)
Well controlled on current regimen. Renal function stable, no changes today.  Lab Results  Component Value Date   CREATININE 0.74 01/05/2020   BUN 20 01/05/2020   NA 141 01/05/2020   K 4.0 01/05/2020   CL 107 01/05/2020   CO2 27 01/05/2020   Lab Results  Component Value Date   NA 141 01/05/2020   K 4.0 01/05/2020   CL 107 01/05/2020   CO2 27 01/05/2020

## 2020-01-13 NOTE — Assessment & Plan Note (Signed)
Adding hyoscyamine for prn use given recent episode of spasm

## 2020-01-13 NOTE — Assessment & Plan Note (Signed)
Managed with zetia.  LDL is at goal  lfts are normal.  No changes today   Lab Results  Component Value Date   CHOL 177 01/05/2020   HDL 50.20 01/05/2020   LDLCALC 104 (H) 01/05/2020   LDLDIRECT 103.0 06/16/2017   TRIG 117.0 01/05/2020   CHOLHDL 4 01/05/2020   Lab Results  Component Value Date   ALT 14 01/05/2020   AST 17 01/05/2020   ALKPHOS 66 01/05/2020   BILITOT 0.7 01/05/2020

## 2020-02-14 ENCOUNTER — Other Ambulatory Visit: Payer: Self-pay | Admitting: Internal Medicine

## 2020-03-07 ENCOUNTER — Other Ambulatory Visit: Payer: Self-pay | Admitting: Internal Medicine

## 2020-03-20 ENCOUNTER — Encounter: Payer: Self-pay | Admitting: Internal Medicine

## 2020-03-22 LAB — HM MAMMOGRAPHY

## 2020-03-26 ENCOUNTER — Ambulatory Visit: Payer: Medicare Other | Admitting: Anesthesiology

## 2020-03-27 ENCOUNTER — Telehealth: Payer: Self-pay | Admitting: Internal Medicine

## 2020-03-27 NOTE — Telephone Encounter (Signed)
Pt has a couple question about her dexa scan order

## 2020-03-27 NOTE — Telephone Encounter (Signed)
Pt called and wanted to know if DrTullo wants her to go [place] [facility] [place] [facility] she received a letter saying she need to make appointment for [facility]

## 2020-03-27 NOTE — Telephone Encounter (Signed)
Spoke with pt to let her know that she should have her bone density scan done where ever she had her last one done and that was [place] [facility]. Pt gave a verbal understanding.

## 2020-03-27 NOTE — Telephone Encounter (Signed)
Spoke with pt to let her know that she should have her bone density scan done where ever she had her last one done and that was [facility]. Pt gave a verbal understanding.

## 2020-03-28 ENCOUNTER — Encounter: Payer: Self-pay | Admitting: Anesthesiology

## 2020-03-28 ENCOUNTER — Ambulatory Visit: Payer: Medicare Other | Attending: Anesthesiology | Admitting: Anesthesiology

## 2020-03-28 ENCOUNTER — Other Ambulatory Visit: Payer: Self-pay

## 2020-03-28 VITALS — BP 136/65 | HR 62 | Temp 98.2°F | Resp 18 | Ht 66.0 in | Wt 145.0 lb

## 2020-03-28 DIAGNOSIS — M5136 Other intervertebral disc degeneration, lumbar region: Secondary | ICD-10-CM | POA: Insufficient documentation

## 2020-03-28 DIAGNOSIS — M545 Low back pain, unspecified: Secondary | ICD-10-CM | POA: Insufficient documentation

## 2020-03-28 DIAGNOSIS — M48061 Spinal stenosis, lumbar region without neurogenic claudication: Secondary | ICD-10-CM | POA: Diagnosis present

## 2020-03-28 DIAGNOSIS — M79605 Pain in left leg: Secondary | ICD-10-CM | POA: Diagnosis present

## 2020-03-28 DIAGNOSIS — M4125 Other idiopathic scoliosis, thoracolumbar region: Secondary | ICD-10-CM | POA: Diagnosis present

## 2020-03-28 HISTORY — DX: Pain in left leg: M79.605

## 2020-03-28 HISTORY — DX: Low back pain, unspecified: M54.50

## 2020-03-28 NOTE — Patient Instructions (Signed)
GENERAL RISKS AND COMPLICATIONS  What are the risk, side effects and possible complications? Generally speaking, most procedures are safe.  However, with any procedure there are risks, side effects, and the possibility of complications.  The risks and complications are dependent upon the sites that are lesioned, or the type of nerve block to be performed.  The closer the procedure is to the spine, the more serious the risks are.  Great care is taken when placing the radio frequency needles, block needles or lesioning probes, but sometimes complications can occur. 1. Infection: Any time there is an injection through the skin, there is a risk of infection.  This is why sterile conditions are used for these blocks.  There are four possible types of infection. 1. Localized skin infection. 2. Central Nervous System Infection-This can be in the form of Meningitis, which can be deadly. 3. Epidural Infections-This can be in the form of an epidural abscess, which can cause pressure inside of the spine, causing compression of the spinal cord with subsequent paralysis. This would require an emergency surgery to decompress, and there are no guarantees that the patient would recover from the paralysis. 4. Discitis-This is an infection of the intervertebral discs.  It occurs in about 1% of discography procedures.  It is difficult to treat and it may lead to surgery.        2. Pain: the needles have to go through skin and soft tissues, will cause soreness.       3. Damage to internal structures:  The nerves to be lesioned may be near blood vessels or    other nerves which can be potentially damaged.       4. Bleeding: Bleeding is more common if the patient is taking blood thinners such as  aspirin, Coumadin, Ticiid, Plavix, etc., or if he/she have some genetic predisposition  such as hemophilia. Bleeding into the spinal canal can cause compression of the spinal  cord with subsequent paralysis.  This would require an  emergency surgery to  decompress and there are no guarantees that the patient would recover from the  paralysis.       5. Pneumothorax:  Puncturing of a lung is a possibility, every time a needle is introduced in  the area of the chest or upper back.  Pneumothorax refers to free air around the  collapsed lung(s), inside of the thoracic cavity (chest cavity).  Another two possible  complications related to a similar event would include: Hemothorax and Chylothorax.   These are variations of the Pneumothorax, where instead of air around the collapsed  lung(s), you may have blood or chyle, respectively.       6. Spinal headaches: They may occur with any procedures in the area of the spine.       7. Persistent CSF (Cerebro-Spinal Fluid) leakage: This is a rare problem, but may occur  with prolonged intrathecal or epidural catheters either due to the formation of a fistulous  track or a dural tear.       8. Nerve damage: By working so close to the spinal cord, there is always a possibility of  nerve damage, which could be as serious as a permanent spinal cord injury with  paralysis.       9. Death:  Although rare, severe deadly allergic reactions known as "Anaphylactic  reaction" can occur to any of the medications used.      10. Worsening of the symptoms:  We can always make thing worse.    What are the chances of something like this happening? Chances of any of this occuring are extremely low.  By statistics, you have more of a chance of getting killed in a motor vehicle accident: while driving to the hospital than any of the above occurring .  Nevertheless, you should be aware that they are possibilities.  In general, it is similar to taking a shower.  Everybody knows that you can slip, hit your head and get killed.  Does that mean that you should not shower again?  Nevertheless always keep in mind that statistics do not mean anything if you happen to be on the wrong side of them.  Even if a procedure has a 1  (one) in a 1,000,000 (million) chance of going wrong, it you happen to be that one..Also, keep in mind that by statistics, you have more of a chance of having something go wrong when taking medications.  Who should not have this procedure? If you are on a blood thinning medication (e.g. Coumadin, Plavix, see list of "Blood Thinners"), or if you have an active infection going on, you should not have the procedure.  If you are taking any blood thinners, please inform your physician.  How should I prepare for this procedure?  Do not eat or drink anything at least six hours prior to the procedure.  Bring a driver with you .  It cannot be a taxi.  Come accompanied by an adult that can drive you back, and that is strong enough to help you if your legs get weak or numb from the local anesthetic.  Take all of your medicines the morning of the procedure with just enough water to swallow them.  If you have diabetes, make sure that you are scheduled to have your procedure done first thing in the morning, whenever possible.  If you have diabetes, take only half of your insulin dose and notify our nurse that you have done so as soon as you arrive at the clinic.  If you are diabetic, but only take blood sugar pills (oral hypoglycemic), then do not take them on the morning of your procedure.  You may take them after you have had the procedure.  Do not take aspirin or any aspirin-containing medications, at least eleven (11) days prior to the procedure.  They may prolong bleeding.  Wear loose fitting clothing that may be easy to take off and that you would not mind if it got stained with Betadine or blood.  Do not wear any jewelry or perfume  Remove any nail coloring.  It will interfere with some of our monitoring equipment.  NOTE: Remember that this is not meant to be interpreted as a complete list of all possible complications.  Unforeseen problems may occur.  BLOOD THINNERS The following drugs  contain aspirin or other products, which can cause increased bleeding during surgery and should not be taken for 2 weeks prior to and 1 week after surgery.  If you should need take something for relief of minor pain, you may take acetaminophen which is found in Tylenol,m Datril, Anacin-3 and Panadol. It is not blood thinner. The products listed below are.  Do not take any of the products listed below in addition to any listed on your instruction sheet.  A.P.C or A.P.C with Codeine Codeine Phosphate Capsules #3 Ibuprofen Ridaura  ABC compound Congesprin Imuran rimadil  Advil Cope Indocin Robaxisal  Alka-Seltzer Effervescent Pain Reliever and Antacid Coricidin or Coricidin-D  Indomethacin Rufen    Alka-Seltzer plus Cold Medicine Cosprin Ketoprofen S-A-C Tablets  Anacin Analgesic Tablets or Capsules Coumadin Korlgesic Salflex  Anacin Extra Strength Analgesic tablets or capsules CP-2 Tablets Lanoril Salicylate  Anaprox Cuprimine Capsules Levenox Salocol  Anexsia-D Dalteparin Magan Salsalate  Anodynos Darvon compound Magnesium Salicylate Sine-off  Ansaid Dasin Capsules Magsal Sodium Salicylate  Anturane Depen Capsules Marnal Soma  APF Arthritis pain formula Dewitt's Pills Measurin Stanback  Argesic Dia-Gesic Meclofenamic Sulfinpyrazone  Arthritis Bayer Timed Release Aspirin Diclofenac Meclomen Sulindac  Arthritis pain formula Anacin Dicumarol Medipren Supac  Analgesic (Safety coated) Arthralgen Diffunasal Mefanamic Suprofen  Arthritis Strength Bufferin Dihydrocodeine Mepro Compound Suprol  Arthropan liquid Dopirydamole Methcarbomol with Aspirin Synalgos  ASA tablets/Enseals Disalcid Micrainin Tagament  Ascriptin Doan's Midol Talwin  Ascriptin A/D Dolene Mobidin Tanderil  Ascriptin Extra Strength Dolobid Moblgesic Ticlid  Ascriptin with Codeine Doloprin or Doloprin with Codeine Momentum Tolectin  Asperbuf Duoprin Mono-gesic Trendar  Aspergum Duradyne Motrin or Motrin IB Triminicin  Aspirin  plain, buffered or enteric coated Durasal Myochrisine Trigesic  Aspirin Suppositories Easprin Nalfon Trillsate  Aspirin with Codeine Ecotrin Regular or Extra Strength Naprosyn Uracel  Atromid-S Efficin Naproxen Ursinus  Auranofin Capsules Elmiron Neocylate Vanquish  Axotal Emagrin Norgesic Verin  Azathioprine Empirin or Empirin with Codeine Normiflo Vitamin E  Azolid Emprazil Nuprin Voltaren  Bayer Aspirin plain, buffered or children's or timed BC Tablets or powders Encaprin Orgaran Warfarin Sodium  Buff-a-Comp Enoxaparin Orudis Zorpin  Buff-a-Comp with Codeine Equegesic Os-Cal-Gesic   Buffaprin Excedrin plain, buffered or Extra Strength Oxalid   Bufferin Arthritis Strength Feldene Oxphenbutazone   Bufferin plain or Extra Strength Feldene Capsules Oxycodone with Aspirin   Bufferin with Codeine Fenoprofen Fenoprofen Pabalate or Pabalate-SF   Buffets II Flogesic Panagesic   Buffinol plain or Extra Strength Florinal or Florinal with Codeine Panwarfarin   Buf-Tabs Flurbiprofen Penicillamine   Butalbital Compound Four-way cold tablets Penicillin   Butazolidin Fragmin Pepto-Bismol   Carbenicillin Geminisyn Percodan   Carna Arthritis Reliever Geopen Persantine   Carprofen Gold's salt Persistin   Chloramphenicol Goody's Phenylbutazone   Chloromycetin Haltrain Piroxlcam   Clmetidine heparin Plaquenil   Cllnoril Hyco-pap Ponstel   Clofibrate Hydroxy chloroquine Propoxyphen         Before stopping any of these medications, be sure to consult the physician who ordered them.  Some, such as Coumadin (Warfarin) are ordered to prevent or treat serious conditions such as "deep thrombosis", "pumonary embolisms", and other heart problems.  The amount of time that you may need off of the medication may also vary with the medication and the reason for which you were taking it.  If you are taking any of these medications, please make sure you notify your pain physician before you undergo any  procedures.         Pain Management Discharge Instructions  General Discharge Instructions :  If you need to reach your doctor call: Monday-Friday 8:00 am - 4:00 pm at [phone] or toll free [phone].  After clinic hours [phone] to have operator reach doctor.  Bring all of your medication bottles to all your appointments in the pain clinic.  To cancel or reschedule your appointment with Pain Management please remember to call 24 hours in advance to avoid a fee.  Refer to the educational materials which you have been given on: General Risks, I had my Procedure. Discharge Instructions, Post Sedation.  Post Procedure Instructions:  The drugs you were given will stay in your system until tomorrow, so for the next 24 hours you should   not drive, make any legal decisions or drink any alcoholic beverages.  You may eat anything you prefer, but it is better to start with liquids then soups and crackers, and gradually work up to solid foods.  Please notify your doctor immediately if you have any unusual bleeding, trouble breathing or pain that is not related to your normal pain.  Depending on the type of procedure that was done, some parts of your body may feel week and/or numb.  This usually clears up by tonight or the next day.  Walk with the use of an assistive device or accompanied by an adult for the 24 hours.  You may use ice on the affected area for the first 24 hours.  Put ice in a Ziploc bag and cover with a towel and place against area 15 minutes on 15 minutes off.  You may switch to heat after 24 hours.Epidural Steroid Injection Patient Information  Description: The epidural space surrounds the nerves as they exit the spinal cord.  In some patients, the nerves can be compressed and inflamed by a bulging disc or a tight spinal canal (spinal stenosis).  By injecting steroids into the epidural space, we can bring irritated nerves into direct contact with a potentially  helpful medication.  These steroids act directly on the irritated nerves and can reduce swelling and inflammation which often leads to decreased pain.  Epidural steroids may be injected anywhere along the spine and from the neck to the low back depending upon the location of your pain.   After numbing the skin with local anesthetic (like Novocaine), a small needle is passed into the epidural space slowly.  You may experience a sensation of pressure while this is being done.  The entire block usually last less than 10 minutes.  Conditions which may be treated by epidural steroids:   Low back and leg pain  Neck and arm pain  Spinal stenosis  Post-laminectomy syndrome  Herpes zoster (shingles) pain  Pain from compression fractures  Preparation for the injection:  1. Do not eat any solid food or dairy products within 8 hours of your appointment.  2. You may drink clear liquids up to 3 hours before appointment.  Clear liquids include water, black coffee, juice or soda.  No milk or cream please. 3. You may take your regular medication, including pain medications, with a sip of water before your appointment  Diabetics should hold regular insulin (if taken separately) and take 1/2 normal NPH dos the morning of the procedure.  Carry some sugar containing items with you to your appointment. 4. A driver must accompany you and be prepared to drive you home after your procedure.  5. Bring all your current medications with your. 6. An IV may be inserted and sedation may be given at the discretion of the physician.   7. A blood pressure cuff, EKG and other monitors will often be applied during the procedure.  Some patients may need to have extra oxygen administered for a short period. 8. You will be asked to provide medical information, including your allergies, prior to the procedure.  We must know immediately if you are taking blood thinners (like Coumadin/Warfarin)  Or if you are allergic to IV iodine  contrast (dye). We must know if you could possible be pregnant.  Possible side-effects:  Bleeding from needle site  Infection (rare, may require surgery)  Nerve injury (rare)  Numbness & tingling (temporary)  Difficulty urinating (rare, temporary)  Spinal headache (   a headache worse with upright posture)  Light -headedness (temporary)  Pain at injection site (several days)  Decreased blood pressure (temporary)  Weakness in arm/leg (temporary)  Pressure sensation in back/neck (temporary)  Call if you experience:  Fever/chills associated with headache or increased back/neck pain.  Headache worsened by an upright position.  New onset weakness or numbness of an extremity below the injection site  Hives or difficulty breathing (go to the emergency room)  Inflammation or drainage at the infection site  Severe back/neck pain  Any new symptoms which are concerning to you  Please note:  Although the local anesthetic injected can often make your back or neck feel good for several hours after the injection, the pain will likely return.  It takes 3-7 days for steroids to work in the epidural space.  You may not notice any pain relief for at least that one week.  If effective, we will often do a series of three injections spaced 3-6 weeks apart to maximally decrease your pain.  After the initial series, we generally will wait several months before considering a repeat injection of the same type.  If you have any questions, please call [phone] [facility]

## 2020-03-28 NOTE — Progress Notes (Signed)
Safety precautions to be maintained throughout the outpatient stay will include: orient to surroundings, keep bed in low position, maintain call bell within reach at all times, provide assistance with transfer out of bed and ambulation.  

## 2020-03-29 ENCOUNTER — Encounter: Payer: Self-pay | Admitting: Internal Medicine

## 2020-03-29 NOTE — Progress Notes (Signed)
Subjective:  Patient ID: Brenda Cardenas, female    DOB: 04/16/1932  Age: 86 y.o. MRN: [id number]  CC: Back Pain (low) and Pain (bilateral buttock)   Procedure: None  HPI Brenda Cardenas presents for reevaluation having last been seen in 2019.  She was recently seen by Dr. Marry Guan for evaluation of some left lower leg pain and left lateral thigh pain and he is subsequently referred her to [place] feeling that this is secondary to an ongoing back issue.  In the past she has had epidural steroid injections that have given her symptomatic relief of a component of her sciatica symptoms but limited relief of her low back pain.  At present the sciatica symptoms are more the problem.  She also mentions that she has been sleeping on a wedge secondary to some reflux issues and this is caused some intermittent neck pain and seems to aggravate her low back pain as well.  No change in strength is noted and she is ambulating reasonably well.  No change in bowel or bladder function is noted.  The pain is described as an aching gnawing type pain worse with prolonged standing and certain rotational motion.  It seems to be alleviated when she is sitting or recumbent.  Outpatient Medications Prior to Visit  Medication Sig Dispense Refill  . acetaminophen (TYLENOL) 325 MG tablet Take 650 mg by mouth 2 (two) times daily.     Marland Kitchen atenolol (TENORMIN) 25 MG tablet TAKE ONE TABLET EVERY DAY 90 tablet 1  . calcium citrate-vitamin D (CITRACAL+D) 315-200 MG-UNIT per tablet Take 2 tablets by mouth daily.    . Cholecalciferol (VITAMIN D3) 2000 units TABS Take 2,000 Units by mouth daily.     Marland Kitchen CRANBERRY PO Take 2 capsules by mouth daily.     Marland Kitchen ezetimibe (ZETIA) 10 MG tablet TAKE 1 TABLET BY MOUTH DAILY 90 tablet 1  . Multiple Vitamins-Minerals (CENTRUM SILVER 50+WOMEN PO) 1 tablet by mouth every day    . mupirocin ointment (BACTROBAN) 2 %     . omeprazole (PRILOSEC) 20 MG capsule TAKE 1 CAPSULE BY MOUTH EVERY DAY 90 capsule 1  .  Probiotic Product (ALIGN PO) Take 1 tablet by mouth daily.     . hyoscyamine (ANASPAZ) 0.125 MG TBDP disintergrating tablet Place 1 tablet (0.125 mg total) under the tongue every 6 (six) hours as needed (as needed for esophageal spasm). (Patient not taking: Reported on 03/28/2020) 30 tablet 0   Facility-Administered Medications Prior to Visit  Medication Dose Route Frequency Provider Last Rate Last Admin  . denosumab (PROLIA) injection 60 mg  60 mg Subcutaneous Once Einar Pheasant, MD        Review of Systems CNS: No confusion or sedation Cardiac: No angina or palpitations GI: No abdominal pain or constipation Constitutional: No nausea vomiting fevers or chills  Objective:  BP 136/65   Pulse 62   Temp 98.2 F (36.8 C) (Oral)   Resp 18   Ht 5\' 6"  (1.676 m)   Wt 145 lb (65.8 kg)   SpO2 99%   BMI 23.40 kg/m    BP Readings from Last 3 Encounters:  03/28/20 136/65  01/11/20 122/60  07/05/19 122/68     Wt Readings from Last 3 Encounters:  03/28/20 145 lb (65.8 kg)  01/11/20 145 lb (65.8 kg)  07/05/19 145 lb (65.8 kg)     Physical Exam Pt is alert and oriented PERRL EOMI HEART IS RRR no murmur or rub LCTA no wheezing or rales  MUSCULOSKELETAL reveals some paraspinous muscle tenderness and severe kyphoscoliosis noted in the lumbar region.  She goes from seated to standing with some minimal difficulty but ambulates reasonably well.  Her lower extremity muscle tone and bulk is as per baseline.  Labs  Lab Results  Component Value Date   HGBA1C 5.4 01/05/2020   HGBA1C 5.5 06/16/2018   HGBA1C 5.7 03/24/2018   Lab Results  Component Value Date   MICROALBUR 1.0 01/05/2020   LDLCALC 104 (H) 01/05/2020   CREATININE 0.74 01/05/2020    -------------------------------------------------------------------------------------------------------------------- Lab Results  Component Value Date   WBC 9.1 01/05/2020   HGB 12.0 01/05/2020   HCT 35.7 (L) 01/05/2020   PLT 161.0  01/05/2020   GLUCOSE 95 01/05/2020   CHOL 177 01/05/2020   TRIG 117.0 01/05/2020   HDL 50.20 01/05/2020   LDLDIRECT 103.0 06/16/2017   LDLCALC 104 (H) 01/05/2020   ALT 14 01/05/2020   AST 17 01/05/2020   NA 141 01/05/2020   K 4.0 01/05/2020   CL 107 01/05/2020   CREATININE 0.74 01/05/2020   BUN 20 01/05/2020   CO2 27 01/05/2020   TSH 0.71 01/05/2020   HGBA1C 5.4 01/05/2020   MICROALBUR 1.0 01/05/2020    --------------------------------------------------------------------------------------------------------------------- DG C-Arm 1-60 Min-No Report  Result Date: 08/12/2018 Fluoroscopy was utilized by the requesting physician.  No radiographic interpretation.     Assessment & Plan:   Brenda Cardenas was seen today for back pain and pain.  Diagnoses and all orders for this visit:  Spinal stenosis of lumbar region without neurogenic claudication -     Lumbar Epidural Injection; Future  DDD (degenerative disc disease), lumbar -     Lumbar Epidural Injection; Future  Other idiopathic scoliosis, thoracolumbar region  Low back pain radiating to left leg -     Lumbar Epidural Injection; Future        ----------------------------------------------------------------------------------------------------------------------  Problem List Items Addressed This Visit      Unprioritized   Idiopathic scoliosis   Low back pain radiating to left leg   Relevant Orders   Lumbar Epidural Injection   Spinal stenosis of lumbar region - Primary   Relevant Orders   Lumbar Epidural Injection    Other Visit Diagnoses    DDD (degenerative disc disease), lumbar       Relevant Orders   Lumbar Epidural Injection        ----------------------------------------------------------------------------------------------------------------------  1. Spinal stenosis of lumbar region without neurogenic claudication We talked about an option for an epidural steroid injection.  She has had these in the  past and generally gets good relief with them.  Unfortunately this pain has remained quite recalcitrant and has not improved with stretching, physical therapy or baseline medication management.  Furthermore I want her to discontinue use of the wedge for the next 2 weeks to see if this might alleviate some of her low back pain as well. - Lumbar Epidural Injection; Future  2. DDD (degenerative disc disease), lumbar As above - Lumbar Epidural Injection; Future  3. Other idiopathic scoliosis, thoracolumbar region As above  4. Low back pain radiating to left leg As above - Lumbar Epidural Injection; Future    ----------------------------------------------------------------------------------------------------------------------  I am having Brenda Cardenas "Arlyss Queen" maintain her calcium citrate-vitamin D, acetaminophen, Probiotic Product (ALIGN PO), Vitamin D3, Multiple Vitamins-Minerals (CENTRUM SILVER 50+WOMEN PO), CRANBERRY PO, mupirocin ointment, ezetimibe, hyoscyamine, omeprazole, and atenolol. We will continue to administer denosumab.   No orders of the defined types were placed in this encounter.  Patient's  Medications  New Prescriptions   No medications on file  Previous Medications   ACETAMINOPHEN (TYLENOL) 325 MG TABLET    Take 650 mg by mouth 2 (two) times daily.    ATENOLOL (TENORMIN) 25 MG TABLET    TAKE ONE TABLET EVERY DAY   CALCIUM CITRATE-VITAMIN D (CITRACAL+D) 315-200 MG-UNIT PER TABLET    Take 2 tablets by mouth daily.   CHOLECALCIFEROL (VITAMIN D3) 2000 UNITS TABS    Take 2,000 Units by mouth daily.    CRANBERRY PO    Take 2 capsules by mouth daily.    EZETIMIBE (ZETIA) 10 MG TABLET    TAKE 1 TABLET BY MOUTH DAILY   HYOSCYAMINE (ANASPAZ) 0.125 MG TBDP DISINTERGRATING TABLET    Place 1 tablet (0.125 mg total) under the tongue every 6 (six) hours as needed (as needed for esophageal spasm).   MULTIPLE VITAMINS-MINERALS (CENTRUM SILVER 50+WOMEN PO)    1 tablet by  mouth every day   MUPIROCIN OINTMENT (BACTROBAN) 2 %       OMEPRAZOLE (PRILOSEC) 20 MG CAPSULE    TAKE 1 CAPSULE BY MOUTH EVERY DAY   PROBIOTIC PRODUCT (ALIGN PO)    Take 1 tablet by mouth daily.   Modified Medications   No medications on file  Discontinued Medications   No medications on file   ----------------------------------------------------------------------------------------------------------------------  Follow-up: Return in about 2 weeks (around 04/11/2020) for evaluation, procedure.    Molli Barrows, MD

## 2020-04-04 LAB — HM DEXA SCAN

## 2020-04-15 ENCOUNTER — Encounter: Payer: Self-pay | Admitting: Anesthesiology

## 2020-04-15 ENCOUNTER — Other Ambulatory Visit: Payer: Self-pay | Admitting: Anesthesiology

## 2020-04-15 ENCOUNTER — Ambulatory Visit
Admission: RE | Admit: 2020-04-15 | Discharge: 2020-04-15 | Disposition: A | Discharge status: Home / Self Care | Payer: Medicare Other | Source: Ambulatory Visit | Attending: Anesthesiology | Admitting: Anesthesiology

## 2020-04-15 ENCOUNTER — Ambulatory Visit (HOSPITAL_BASED_OUTPATIENT_CLINIC_OR_DEPARTMENT_OTHER): Payer: Medicare Other | Admitting: Anesthesiology

## 2020-04-15 ENCOUNTER — Other Ambulatory Visit: Payer: Self-pay

## 2020-04-15 VITALS — BP 161/98 | HR 66 | Resp 14 | Ht 66.0 in | Wt 145.0 lb

## 2020-04-15 DIAGNOSIS — M5136 Other intervertebral disc degeneration, lumbar region: Secondary | ICD-10-CM | POA: Insufficient documentation

## 2020-04-15 DIAGNOSIS — M48061 Spinal stenosis, lumbar region without neurogenic claudication: Secondary | ICD-10-CM

## 2020-04-15 DIAGNOSIS — M4125 Other idiopathic scoliosis, thoracolumbar region: Secondary | ICD-10-CM | POA: Diagnosis present

## 2020-04-15 DIAGNOSIS — M545 Low back pain: Secondary | ICD-10-CM | POA: Insufficient documentation

## 2020-04-15 DIAGNOSIS — M79605 Pain in left leg: Secondary | ICD-10-CM | POA: Insufficient documentation

## 2020-04-15 DIAGNOSIS — M5432 Sciatica, left side: Secondary | ICD-10-CM | POA: Insufficient documentation

## 2020-04-15 DIAGNOSIS — R52 Pain, unspecified: Secondary | ICD-10-CM

## 2020-04-15 HISTORY — DX: Sciatica, left side: M54.32

## 2020-04-15 MED ORDER — SODIUM CHLORIDE 0.9% FLUSH
10.0000 mL | Freq: Once | INTRAVENOUS | Status: AC
  Administered 2020-04-15: 10 mL

## 2020-04-15 MED ORDER — TRIAMCINOLONE ACETONIDE 40 MG/ML IJ SUSP
40.0000 mg | Freq: Once | INTRAMUSCULAR | Status: AC
  Administered 2020-04-15: 40 mg

## 2020-04-15 MED ORDER — TRIAMCINOLONE ACETONIDE 40 MG/ML IJ SUSP
INTRAMUSCULAR | Status: AC
  Filled 2020-04-15: qty 1

## 2020-04-15 MED ORDER — IOHEXOL 180 MG/ML  SOLN
10.0000 mL | Freq: Once | INTRAMUSCULAR | Status: AC | PRN
  Administered 2020-04-15: 10 mL via EPIDURAL

## 2020-04-15 MED ORDER — ROPIVACAINE HCL 2 MG/ML IJ SOLN
10.0000 mL | Freq: Once | INTRAMUSCULAR | Status: AC
  Administered 2020-04-15: 10 mL via EPIDURAL

## 2020-04-15 MED ORDER — LIDOCAINE HCL (PF) 1 % IJ SOLN
INTRAMUSCULAR | Status: AC
  Filled 2020-04-15: qty 5

## 2020-04-15 MED ORDER — LIDOCAINE HCL (PF) 1 % IJ SOLN
5.0000 mL | Freq: Once | INTRAMUSCULAR | Status: AC
  Administered 2020-04-15: 5 mL via SUBCUTANEOUS

## 2020-04-15 MED ORDER — IOHEXOL 180 MG/ML  SOLN
INTRAMUSCULAR | Status: AC
  Filled 2020-04-15: qty 20

## 2020-04-15 MED ORDER — ROPIVACAINE HCL 2 MG/ML IJ SOLN
INTRAMUSCULAR | Status: AC
  Filled 2020-04-15: qty 10

## 2020-04-15 MED ORDER — SODIUM CHLORIDE (PF) 0.9 % IJ SOLN
INTRAMUSCULAR | Status: AC
  Filled 2020-04-15: qty 10

## 2020-04-15 NOTE — Progress Notes (Signed)
Subjective:  Patient ID: Brenda Cardenas, female    DOB: [birth year]  Age: 86 y.o. MRN: [id number]  CC: Back Pain (lower)   Procedure: L5-S1 epidural steroid under fluoroscopic guidance with no sedation  HPI Brenda Cardenas presents for reevaluation.  She presents today for her epidural.  She states that she continues to have her left hip pain and left knee pain greater than on the right side.  She still having some intermittent back pain but the back pain is reasonably well controlled.  She still gets radiating pain into the knee and hip on the left side.  Is worse with prolonged standing or any activity.  She continues to try to do her stretching strengthening exercises but is limited secondary to this protracted pain.  Otherwise she is in her usual state today and no changes are reported since her last visit.  Outpatient Medications Prior to Visit  Medication Sig Dispense Refill  . acetaminophen (TYLENOL) 325 MG tablet Take 650 mg by mouth 2 (two) times daily.     Marland Kitchen atenolol (TENORMIN) 25 MG tablet TAKE ONE TABLET EVERY DAY 90 tablet 1  . calcium citrate-vitamin D (CITRACAL+D) 315-200 MG-UNIT per tablet Take 2 tablets by mouth daily.    . Cholecalciferol (VITAMIN D3) 2000 units TABS Take 2,000 Units by mouth daily.     Marland Kitchen CRANBERRY PO Take 2 capsules by mouth daily.     Marland Kitchen ezetimibe (ZETIA) 10 MG tablet TAKE 1 TABLET BY MOUTH DAILY 90 tablet 1  . hyoscyamine (ANASPAZ) 0.125 MG TBDP disintergrating tablet Place 1 tablet (0.125 mg total) under the tongue every 6 (six) hours as needed (as needed for esophageal spasm). 30 tablet 0  . Multiple Vitamins-Minerals (CENTRUM SILVER 50+WOMEN PO) 1 tablet by mouth every day    . mupirocin ointment (BACTROBAN) 2 %     . omeprazole (PRILOSEC) 20 MG capsule TAKE 1 CAPSULE BY MOUTH EVERY DAY 90 capsule 1  . Probiotic Product (ALIGN PO) Take 1 tablet by mouth daily.      Facility-Administered Medications Prior to Visit  Medication Dose Route Frequency  Provider Last Rate Last Admin  . denosumab (PROLIA) injection 60 mg  60 mg Subcutaneous Once Einar Pheasant, MD        Review of Systems CNS: No confusion or sedation Cardiac: No angina or palpitations GI: No abdominal pain or constipation Constitutional: No nausea vomiting fevers or chills  Objective:  BP (!) 161/98   Pulse 66   Resp 14   Ht 5\' 6"  (1.676 m)   Wt 145 lb (65.8 kg)   SpO2 100%   BMI 23.40 kg/m    BP Readings from Last 3 Encounters:  04/15/20 (!) 161/98  03/28/20 136/65  01/11/20 122/60     Wt Readings from Last 3 Encounters:  04/15/20 145 lb (65.8 kg)  03/28/20 145 lb (65.8 kg)  01/11/20 145 lb (65.8 kg)     Physical Exam Pt is alert and oriented PERRL EOMI HEART IS RRR no murmur or rub LCTA no wheezing or rales MUSCULOSKELETAL reveals some paraspinous muscle tenderness but no overt trigger points.  She has obvious rotary scoliosis present and she is ambulating with the assistance of a cane and has a very antalgic gait.  Labs  Lab Results  Component Value Date   HGBA1C 5.4 01/05/2020   HGBA1C 5.5 06/16/2018   HGBA1C 5.7 03/24/2018   Lab Results  Component Value Date   MICROALBUR 1.0 01/05/2020   [place] 104 (H) 01/05/2020  CREATININE 0.74 01/05/2020    -------------------------------------------------------------------------------------------------------------------- Lab Results  Component Value Date   WBC 9.1 01/05/2020   HGB 12.0 01/05/2020   HCT 35.7 (L) 01/05/2020   PLT 161.0 01/05/2020   GLUCOSE 95 01/05/2020   CHOL 177 01/05/2020   TRIG 117.0 01/05/2020   HDL 50.20 01/05/2020   LDLDIRECT 103.0 06/16/2017   LDLCALC 104 (H) 01/05/2020   ALT 14 01/05/2020   AST 17 01/05/2020   NA 141 01/05/2020   K 4.0 01/05/2020   CL 107 01/05/2020   CREATININE 0.74 01/05/2020   BUN 20 01/05/2020   CO2 27 01/05/2020   TSH 0.71 01/05/2020   HGBA1C 5.4 01/05/2020   MICROALBUR 1.0 01/05/2020     --------------------------------------------------------------------------------------------------------------------- DG PAIN CLINIC C-ARM 1-60 MIN NO REPORT  Result Date: 04/15/2020 Fluoro was used, but no Radiologist interpretation will be provided. Please refer to "NOTES" tab for provider progress note.    Assessment & Plan:   Lakayla was seen today for back pain.  Diagnoses and all orders for this visit:  Spinal stenosis of lumbar region without neurogenic claudication -     Lumbar Epidural Injection; Future  DDD (degenerative disc disease), lumbar -     Lumbar Epidural Injection; Future  Other idiopathic scoliosis, thoracolumbar region  Low back pain radiating to left leg -     Lumbar Epidural Injection; Future  Sciatica, left side  Other orders -     triamcinolone acetonide (KENALOG-40) injection 40 mg -     sodium chloride flush (NS) 0.9 % injection 10 mL -     ropivacaine (PF) 2 mg/mL (0.2%) (NAROPIN) injection 10 mL -     lidocaine (PF) (XYLOCAINE) 1 % injection 5 mL -     iohexol (OMNIPAQUE) 180 MG/ML injection 10 mL        ----------------------------------------------------------------------------------------------------------------------  Problem List Items Addressed This Visit      Unprioritized   Idiopathic scoliosis   Low back pain radiating to left leg   Relevant Orders   Lumbar Epidural Injection   Sciatica, left side   Spinal stenosis of lumbar region - Primary   Relevant Orders   Lumbar Epidural Injection    Other Visit Diagnoses    DDD (degenerative disc disease), lumbar       Relevant Medications   triamcinolone acetonide (KENALOG-40) injection 40 mg (Completed)   Other Relevant Orders   Lumbar Epidural Injection        ----------------------------------------------------------------------------------------------------------------------  1. Spinal stenosis of lumbar region without neurogenic claudication Based on our  discussion today and following review with Dr. Marry Guan I think she is a good candidate for an epidural steroid.  She is done well with these in the past.  We gone over the risks and benefits of the procedure with her in full detail and all questions were answered.  We will schedule her for return to clinic approximately 2 months.  Today's epidural will attempted at the L5-S1 level. - Lumbar Epidural Injection; Future  2. DDD (degenerative disc disease), lumbar As above and continue core stretching strengthening exercise - Lumbar Epidural Injection; Future  3. Other idiopathic scoliosis, thoracolumbar region .  As above  4. Low back pain radiating to left leg  - Lumbar Epidural Injection; Future  5. Sciatica, left side  as above    ----------------------------------------------------------------------------------------------------------------------  I am having [name] "Arlyss Queen" maintain her calcium citrate-vitamin D, acetaminophen, Probiotic Product (ALIGN PO), Vitamin D3, Multiple Vitamins-Minerals (CENTRUM SILVER 50+WOMEN PO), CRANBERRY PO, mupirocin ointment,  ezetimibe, hyoscyamine, omeprazole, and atenolol. We administered triamcinolone acetonide, sodium chloride flush, ropivacaine (PF) 2 mg/mL (0.2%), lidocaine (PF), and iohexol. We will continue to administer denosumab.   Meds ordered this encounter  Medications  . triamcinolone acetonide (KENALOG-40) injection 40 mg  . sodium chloride flush (NS) 0.9 % injection 10 mL  . ropivacaine (PF) 2 mg/mL (0.2%) (NAROPIN) injection 10 mL  . lidocaine (PF) (XYLOCAINE) 1 % injection 5 mL  . iohexol (OMNIPAQUE) 180 MG/ML injection 10 mL   Patient's Medications  New Prescriptions   No medications on file  Previous Medications   ACETAMINOPHEN (TYLENOL) 325 MG TABLET    Take 650 mg by mouth 2 (two) times daily.    ATENOLOL (TENORMIN) 25 MG TABLET    TAKE ONE TABLET EVERY DAY   CALCIUM CITRATE-VITAMIN D (CITRACAL+D) 315-200  MG-UNIT PER TABLET    Take 2 tablets by mouth daily.   CHOLECALCIFEROL (VITAMIN D3) 2000 UNITS TABS    Take 2,000 Units by mouth daily.    CRANBERRY PO    Take 2 capsules by mouth daily.    EZETIMIBE (ZETIA) 10 MG TABLET    TAKE 1 TABLET BY MOUTH DAILY   HYOSCYAMINE (ANASPAZ) 0.125 MG TBDP DISINTERGRATING TABLET    Place 1 tablet (0.125 mg total) under the tongue every 6 (six) hours as needed (as needed for esophageal spasm).   MULTIPLE VITAMINS-MINERALS (CENTRUM SILVER 50+WOMEN PO)    1 tablet by mouth every day   MUPIROCIN OINTMENT (BACTROBAN) 2 %       OMEPRAZOLE (PRILOSEC) 20 MG CAPSULE    TAKE 1 CAPSULE BY MOUTH EVERY DAY   PROBIOTIC PRODUCT (ALIGN PO)    Take 1 tablet by mouth daily.   Modified Medications   No medications on file  Discontinued Medications   No medications on file   ----------------------------------------------------------------------------------------------------------------------  Follow-up: Return in about 2 months (around 06/16/2020) for evaluation, procedure.   Procedure: L5-S1 LESI with fluoroscopic guidance and no moderate sedation  NOTE: The risks, benefits, and expectations of the procedure have been discussed and explained to the patient who was understanding and in agreement with suggested treatment plan. No guarantees were made.  DESCRIPTION OF PROCEDURE: Lumbar epidural steroid injection with no IV Versed, EKG, blood pressure, pulse, and pulse oximetry monitoring. The procedure was performed with the patient in the prone position under fluoroscopic guidance.  Sterile prep x3 was initiated and I then injected subcutaneous lidocaine to the overlying L5-S1 site after its fluoroscopic identifictation.  Using strict aseptic technique, I then advanced an 18-gauge Tuohy epidural needle in the midline using interlaminar approach via loss-of-resistance to saline technique. There was negative aspiration for heme or  CSF.  I then confirmed position with both AP and  Lateral fluoroscan.  2 cc of contrast dye were injected and a  total of 5 mL of Preservative-Free normal saline mixed with 40 mg of Kenalog and 1cc Ropicaine 0.2 percent were injected incrementally via the  epidurally placed needle. The needle was removed. The patient tolerated the injection well and was convalesced and discharged to home in stable condition. Should the patient have any post procedure difficulty they have been instructed on how to contact us for assistance.    Molli Barrows, MD

## 2020-04-15 NOTE — Patient Instructions (Signed)

## 2020-04-15 NOTE — Progress Notes (Signed)
Safety precautions to be maintained throughout the outpatient stay will include: orient to surroundings, keep bed in low position, maintain call bell within reach at all times, provide assistance with transfer out of bed and ambulation.  

## 2020-04-16 ENCOUNTER — Telehealth: Payer: Self-pay | Admitting: Anesthesiology

## 2020-04-16 ENCOUNTER — Telehealth: Payer: Self-pay | Admitting: *Deleted

## 2020-04-16 NOTE — Telephone Encounter (Signed)
Attempted to call for post procedure follow-up. Message left. 

## 2020-04-16 NOTE — Telephone Encounter (Signed)
Patient states she is doing well and just wanted to let staff know, Dr. Andree Elk must have hit the right spot in procedure

## 2020-04-21 DIAGNOSIS — M1712 Unilateral primary osteoarthritis, left knee: Secondary | ICD-10-CM | POA: Insufficient documentation

## 2020-05-01 ENCOUNTER — Ambulatory Visit (INDEPENDENT_AMBULATORY_CARE_PROVIDER_SITE_OTHER): Payer: Medicare Other | Admitting: Dermatology

## 2020-05-01 ENCOUNTER — Encounter: Payer: Self-pay | Admitting: Dermatology

## 2020-05-01 ENCOUNTER — Telehealth: Payer: Self-pay | Admitting: Internal Medicine

## 2020-05-01 ENCOUNTER — Other Ambulatory Visit: Payer: Self-pay

## 2020-05-01 DIAGNOSIS — L820 Inflamed seborrheic keratosis: Secondary | ICD-10-CM | POA: Diagnosis not present

## 2020-05-01 DIAGNOSIS — D1800 Hemangioma unspecified site: Secondary | ICD-10-CM

## 2020-05-01 DIAGNOSIS — I8310 Varicose veins of unspecified lower extremity with inflammation: Secondary | ICD-10-CM

## 2020-05-01 DIAGNOSIS — Z87828 Personal history of other (healed) physical injury and trauma: Secondary | ICD-10-CM

## 2020-05-01 DIAGNOSIS — Z1283 Encounter for screening for malignant neoplasm of skin: Secondary | ICD-10-CM | POA: Diagnosis not present

## 2020-05-01 DIAGNOSIS — D692 Other nonthrombocytopenic purpura: Secondary | ICD-10-CM

## 2020-05-01 DIAGNOSIS — D229 Melanocytic nevi, unspecified: Secondary | ICD-10-CM

## 2020-05-01 DIAGNOSIS — I872 Venous insufficiency (chronic) (peripheral): Secondary | ICD-10-CM | POA: Diagnosis not present

## 2020-05-01 DIAGNOSIS — L821 Other seborrheic keratosis: Secondary | ICD-10-CM

## 2020-05-01 DIAGNOSIS — Z85828 Personal history of other malignant neoplasm of skin: Secondary | ICD-10-CM

## 2020-05-01 DIAGNOSIS — M810 Age-related osteoporosis without current pathological fracture: Secondary | ICD-10-CM

## 2020-05-01 DIAGNOSIS — T1490XA Injury, unspecified, initial encounter: Secondary | ICD-10-CM

## 2020-05-01 DIAGNOSIS — L578 Other skin changes due to chronic exposure to nonionizing radiation: Secondary | ICD-10-CM

## 2020-05-01 DIAGNOSIS — L814 Other melanin hyperpigmentation: Secondary | ICD-10-CM

## 2020-05-01 DIAGNOSIS — L853 Xerosis cutis: Secondary | ICD-10-CM

## 2020-05-01 NOTE — Telephone Encounter (Signed)
Bone density reviewed.  Your T scores are stable. There has been no significant change

## 2020-05-01 NOTE — Progress Notes (Signed)
Follow-Up Visit   Subjective  Brenda Cardenas is a 86 y.o. female who presents for the following: Annual Exam (total body skin exam) and check spot (R arm, hx of skin tear, now has a scab on it). The patient presents for Total-Body Skin Exam (TBSE) for skin cancer screening and mole check.  The following portions of the chart were reviewed this encounter and updated as appropriate:  Tobacco  Allergies  Meds  Problems  Med Hx  Surg Hx  Fam Hx     Review of Systems:  No other skin or systemic complaints except as noted in HPI or Assessment and Plan.  Objective  Well appearing patient in no apparent distress; mood and affect are within normal limits.  A full examination was performed including scalp, head, eyes, ears, nose, lips, neck, chest, axillae, abdomen, back, buttocks, bilateral upper extremities, bilateral lower extremities, hands, feet, fingers, toes, fingernails, and toenails. All findings within normal limits unless otherwise noted below.  Objective  Left Upper Back: Well healed scar with no evidence of recurrence.   Objective  multiple: Well healed scars with no evidence of recurrence.   Objective  Right Forearm - Posterior: Healed laceration  Objective  Right Forearm x 1, legs x 2, L dorsum foot x 1 (4): Erythematous keratotic or waxy stuck-on papule or plaque.   Objective  bil lower legs: Stasis changes lower legs   Assessment & Plan   Lentigines - Scattered tan macules - Discussed due to sun exposure - Benign, observe - Call for any changes  Seborrheic Keratoses - Stuck-on, waxy, tan-brown papules and plaques  - Discussed benign etiology and prognosis. - Observe - Call for any changes  Melanocytic Nevi - Tan-brown and/or pink-flesh-colored symmetric macules and papules - Benign appearing on exam today - Observation - Call clinic for new or changing moles - Recommend daily use of broad spectrum spf 30+ sunscreen to sun-exposed  areas.   Hemangiomas - Red papules - Discussed benign nature - Observe - Call for any changes  Actinic Damage - diffuse scaly erythematous macules with underlying dyspigmentation - Recommend daily broad spectrum sunscreen SPF 30+ to sun-exposed areas, reapply every 2 hours as needed.  - Call for new or changing lesions.  Skin cancer screening performed today.  Varicose Veins - Dilated blue, purple or red veins at the lower extremities - Reassured - These can be treated by sclerotherapy (a procedure to inject a medicine into the veins to make them disappear) if desired, but the treatment is not covered by insurance  Xerosis - diffuse xerotic patches - recommend gentle, hydrating skin care - gentle skin care handout given - recommend cerave cream  Purpura - Violaceous macules and patches - Benign - Related to age, sun damage and/or use of blood thinners - Observe - Can use OTC arnica containing moisturizer such as Dermend Bruise Formula if desired - Call for worsening or other concerns   History of basal cell carcinoma (BCC) Left Upper Back  Clear, observe for changes   History of SCC (squamous cell carcinoma) of skin multiple  Traumatic injury Right Forearm - Posterior  Hx of Traumatic laceration from 12/2019, healed well  Inflamed seborrheic keratosis (4) Right Forearm x 1, legs x 2, L dorsum foot x 1  Destruction of lesion - Right Forearm x 1, legs x 2, L dorsum foot x 1 Complexity: simple   Destruction method: cryotherapy   Informed consent: discussed and consent obtained   Timeout:  patient  name, date of birth, surgical site, and procedure verified Lesion destroyed using liquid nitrogen: Yes   Region frozen until ice ball extended beyond lesion: Yes   Outcome: patient tolerated procedure well with no complications   Post-procedure details: wound care instructions given    Stasis dermatitis of both legs bil lower legs  Benign, observe  Recommend  compression socks/stockings  Skin cancer screening  Return in about 1 year (around 05/01/2021) for TBSE, hx of BCC, SCCs.   I, Othelia Pulling, RMA, am acting as scribe for Sarina Ser, MD .  Documentation: I have reviewed the above documentation for accuracy and completeness, and I agree with the above.  Sarina Ser, MD

## 2020-05-01 NOTE — Assessment & Plan Note (Signed)
T scores stable/unchanged by 2021.  continue prolia

## 2020-05-01 NOTE — Patient Instructions (Signed)
Cerave cream for the dry skin, may use all over body

## 2020-05-02 DIAGNOSIS — I10 Essential (primary) hypertension: Secondary | ICD-10-CM

## 2020-05-02 DIAGNOSIS — E785 Hyperlipidemia, unspecified: Secondary | ICD-10-CM

## 2020-05-09 NOTE — Telephone Encounter (Signed)
Labs have been ordered, CMP and lipid panel. Is there anything else that needs to be ordered?

## 2020-05-27 ENCOUNTER — Telehealth: Payer: Self-pay | Admitting: Internal Medicine

## 2020-05-27 NOTE — Telephone Encounter (Signed)
[facility] called in stated that they need document stating why the patient needs prolia

## 2020-05-27 NOTE — Telephone Encounter (Signed)
Brenda Cardenas Key: [id number] - PA Case ID: [id number] for Prolia.

## 2020-05-28 NOTE — Telephone Encounter (Signed)
[name] Key: [id number] - PA Case ID: [id number] Resubmitted PA with insurance on Cover my meds.

## 2020-05-30 NOTE — Telephone Encounter (Signed)
Approval received Ok to schedule.

## 2020-06-18 ENCOUNTER — Other Ambulatory Visit: Payer: Self-pay | Admitting: Anesthesiology

## 2020-06-18 ENCOUNTER — Encounter: Payer: Self-pay | Admitting: Anesthesiology

## 2020-06-18 ENCOUNTER — Ambulatory Visit (HOSPITAL_BASED_OUTPATIENT_CLINIC_OR_DEPARTMENT_OTHER): Payer: Medicare Other | Admitting: Anesthesiology

## 2020-06-18 ENCOUNTER — Other Ambulatory Visit: Payer: Self-pay

## 2020-06-18 ENCOUNTER — Ambulatory Visit
Admission: RE | Admit: 2020-06-18 | Discharge: 2020-06-18 | Disposition: A | Discharge status: Home / Self Care | Payer: Medicare Other | Source: Ambulatory Visit | Attending: Anesthesiology | Admitting: Anesthesiology

## 2020-06-18 ENCOUNTER — Telehealth: Payer: Self-pay | Admitting: *Deleted

## 2020-06-18 VITALS — BP 161/68 | HR 61 | Temp 97.2°F | Resp 15 | Ht 66.0 in | Wt 145.0 lb

## 2020-06-18 DIAGNOSIS — M25551 Pain in right hip: Secondary | ICD-10-CM | POA: Diagnosis present

## 2020-06-18 DIAGNOSIS — R52 Pain, unspecified: Secondary | ICD-10-CM

## 2020-06-18 DIAGNOSIS — M4125 Other idiopathic scoliosis, thoracolumbar region: Secondary | ICD-10-CM | POA: Diagnosis present

## 2020-06-18 DIAGNOSIS — M5441 Lumbago with sciatica, right side: Secondary | ICD-10-CM

## 2020-06-18 DIAGNOSIS — M51369 Other intervertebral disc degeneration, lumbar region without mention of lumbar back pain or lower extremity pain: Secondary | ICD-10-CM

## 2020-06-18 DIAGNOSIS — M545 Low back pain, unspecified: Secondary | ICD-10-CM

## 2020-06-18 DIAGNOSIS — M5136 Other intervertebral disc degeneration, lumbar region: Secondary | ICD-10-CM | POA: Diagnosis present

## 2020-06-18 DIAGNOSIS — G8929 Other chronic pain: Secondary | ICD-10-CM | POA: Insufficient documentation

## 2020-06-18 DIAGNOSIS — M79605 Pain in left leg: Secondary | ICD-10-CM | POA: Insufficient documentation

## 2020-06-18 DIAGNOSIS — M5432 Sciatica, left side: Secondary | ICD-10-CM | POA: Insufficient documentation

## 2020-06-18 DIAGNOSIS — M48061 Spinal stenosis, lumbar region without neurogenic claudication: Secondary | ICD-10-CM | POA: Diagnosis present

## 2020-06-18 MED ORDER — TRIAMCINOLONE ACETONIDE 40 MG/ML IJ SUSP
INTRAMUSCULAR | Status: AC
  Filled 2020-06-18: qty 1

## 2020-06-18 MED ORDER — SODIUM CHLORIDE (PF) 0.9 % IJ SOLN
INTRAMUSCULAR | Status: AC
  Filled 2020-06-18: qty 10

## 2020-06-18 MED ORDER — SODIUM CHLORIDE 0.9% FLUSH
10.0000 mL | Freq: Once | INTRAVENOUS | Status: AC
  Administered 2020-06-18: 10 mL

## 2020-06-18 MED ORDER — LIDOCAINE HCL (PF) 1 % IJ SOLN
INTRAMUSCULAR | Status: AC
  Filled 2020-06-18: qty 5

## 2020-06-18 MED ORDER — ROPIVACAINE HCL 2 MG/ML IJ SOLN
INTRAMUSCULAR | Status: AC
  Filled 2020-06-18: qty 10

## 2020-06-18 MED ORDER — TRIAMCINOLONE ACETONIDE 40 MG/ML IJ SUSP
40.0000 mg | Freq: Once | INTRAMUSCULAR | Status: AC
  Administered 2020-06-18: 40 mg

## 2020-06-18 MED ORDER — ROPIVACAINE HCL 2 MG/ML IJ SOLN
10.0000 mL | Freq: Once | INTRAMUSCULAR | Status: AC
  Administered 2020-06-18: 10 mL via EPIDURAL

## 2020-06-18 MED ORDER — IOHEXOL 180 MG/ML  SOLN
10.0000 mL | Freq: Once | INTRAMUSCULAR | Status: AC | PRN
  Administered 2020-06-18: 10 mL via EPIDURAL

## 2020-06-18 MED ORDER — LIDOCAINE HCL (PF) 1 % IJ SOLN
5.0000 mL | Freq: Once | INTRAMUSCULAR | Status: AC
  Administered 2020-06-18: 5 mL via SUBCUTANEOUS

## 2020-06-18 NOTE — Progress Notes (Signed)
Subjective:  Patient ID: Brenda Cardenas, female    DOB: 03-Nov-1931  Age: 86 y.o. MRN: [id number]  CC: Back Pain (lower)   Procedure: L5-S1 epidural steroid under fluoroscopic guidance without sedation  HPI [name] presents for reevaluation.  She reports that she did very well following her last epidural back in July.  She got great relief of her left side hip pain and left lower leg pain.  She reports that she did get some recurrence approximately 2 to 3 weeks ago following some increased activity and a near fall.  The pain is back in the previous area and occasionally she will get some pain in the right lower leg as well.  Otherwise she continues to do her exercising and is exercising for 40 minutes at least 3-4 times a week.  No change in upper or lower extremity strength or function or bowel or bladder function is noted at this time.  She has been able to avoid opioid medication and uses conservative therapy.  She desires to proceed with a repeat epidural today.  The pain is noted to be worse with prolonged standing as well.  Outpatient Medications Prior to Visit  Medication Sig Dispense Refill  . acetaminophen (TYLENOL) 325 MG tablet Take 650 mg by mouth 2 (two) times daily.     Marland Kitchen atenolol (TENORMIN) 25 MG tablet TAKE ONE TABLET EVERY DAY 90 tablet 1  . calcium citrate-vitamin D (CITRACAL+D) 315-200 MG-UNIT per tablet Take 2 tablets by mouth daily.    . Cholecalciferol (VITAMIN D3) 2000 units TABS Take 2,000 Units by mouth daily.     Marland Kitchen CRANBERRY PO Take 2 capsules by mouth daily.     Marland Kitchen ezetimibe (ZETIA) 10 MG tablet TAKE 1 TABLET BY MOUTH DAILY 90 tablet 1  . hyoscyamine (ANASPAZ) 0.125 MG TBDP disintergrating tablet Place 1 tablet (0.125 mg total) under the tongue every 6 (six) hours as needed (as needed for esophageal spasm). 30 tablet 0  . Multiple Vitamins-Minerals (CENTRUM SILVER 50+WOMEN PO) 1 tablet by mouth every day    . mupirocin ointment (BACTROBAN) 2 %     . omeprazole  (PRILOSEC) 20 MG capsule TAKE 1 CAPSULE BY MOUTH EVERY DAY 90 capsule 1  . Probiotic Product (ALIGN PO) Take 1 tablet by mouth daily.      Facility-Administered Medications Prior to Visit  Medication Dose Route Frequency Provider Last Rate Last Admin  . denosumab (PROLIA) injection 60 mg  60 mg Subcutaneous Once Einar Pheasant, MD        Review of Systems CNS: No confusion or sedation Cardiac: No angina or palpitations GI: No abdominal pain or constipation Constitutional: No nausea vomiting fevers or chills  Objective:  BP (!) 161/68   Pulse 61   Temp (!) 97.2 F (36.2 C) (Temporal)   Resp 15   Ht 5\' 6"  (1.676 m)   Wt 145 lb (65.8 kg)   SpO2 99%   BMI 23.40 kg/m    BP Readings from Last 3 Encounters:  06/18/20 (!) 161/68  04/15/20 (!) 161/98  03/28/20 136/65     Wt Readings from Last 3 Encounters:  06/18/20 145 lb (65.8 kg)  04/15/20 145 lb (65.8 kg)  03/28/20 145 lb (65.8 kg)     Physical Exam Pt is alert and oriented PERRL EOMI HEART IS RRR no murmur or rub LCTA no wheezing or rales MUSCULOSKELETAL reveals some paraspinous muscle tenderness in the lumbar region but no overt trigger points.  She has an antalgic  gait and uses a cane for assistance.  She has obvious scoliotic deformity present  Labs  Lab Results  Component Value Date   HGBA1C 5.4 01/05/2020   HGBA1C 5.5 06/16/2018   HGBA1C 5.7 03/24/2018   Lab Results  Component Value Date   MICROALBUR 1.0 01/05/2020   LDLCALC 104 (H) 01/05/2020   CREATININE 0.74 01/05/2020    -------------------------------------------------------------------------------------------------------------------- Lab Results  Component Value Date   WBC 9.1 01/05/2020   HGB 12.0 01/05/2020   HCT 35.7 (L) 01/05/2020   PLT 161.0 01/05/2020   GLUCOSE 95 01/05/2020   CHOL 177 01/05/2020   TRIG 117.0 01/05/2020   HDL 50.20 01/05/2020   LDLDIRECT 103.0 06/16/2017   LDLCALC 104 (H) 01/05/2020   ALT 14 01/05/2020   AST  17 01/05/2020   NA 141 01/05/2020   K 4.0 01/05/2020   CL 107 01/05/2020   CREATININE 0.74 01/05/2020   BUN 20 01/05/2020   CO2 27 01/05/2020   TSH 0.71 01/05/2020   HGBA1C 5.4 01/05/2020   MICROALBUR 1.0 01/05/2020    --------------------------------------------------------------------------------------------------------------------- No results found.   Assessment & Plan:   There are no diagnoses linked to this encounter.      ----------------------------------------------------------------------------------------------------------------------  Problem List Items Addressed This Visit    None     1. Spinal stenosis of lumbar region without neurogenic claudication   2. DDD (degenerative disc disease), lumbar   3. Other idiopathic scoliosis, thoracolumbar region   4. Low back pain radiating to left leg   5. Sciatica, left side   6. Acute bilateral low back pain with right-sided sciatica   7. Chronic right-sided low back pain without sciatica   8. Pain of right hip joint   Based on her previous response to her epidural I think is reasonable to proceed with a repeat injection today.  We will do this at today's visit and schedule her for a return to clinic in approximately 2 months for possible repeat epidural at that time.  We gone over the risks and benefits of the procedure with her in full detail and all questions are answered.  Furthermore I want her to continue at her current exercise protocol as this is quite impressive and wonderful that she is as active.  No other changes will be initiated in her pain management protocol at this time.   Procedure: L5-S1 LESI with fluoroscopic guidance and without moderate sedation  NOTE: The risks, benefits, and expectations of the procedure have been discussed and explained to the patient who was understanding and in agreement with suggested treatment plan. No guarantees were made.  DESCRIPTION OF PROCEDURE: Lumbar epidural steroid  injection with no IV Versed, EKG, blood pressure, pulse, and pulse oximetry monitoring. The procedure was performed with the patient in the prone position under fluoroscopic guidance.  Sterile prep x3 was initiated and I then injected subcutaneous lidocaine to the overlying L5-S1 site after its fluoroscopic identifictation.  Using strict aseptic technique, I then advanced an 18-gauge Tuohy epidural needle in the midline using interlaminar approach via loss-of-resistance to saline technique. There was negative aspiration for heme or  CSF.  I then confirmed position with both AP and Lateral fluoroscan.  2 cc of contrast dye were injected and a  total of 5 mL of Preservative-Free normal saline mixed with 40 mg of Kenalog and 1cc Ropicaine 0.2 percent were injected incrementally via the  epidurally placed needle. The needle was removed. The patient tolerated the injection well and was convalesced and discharged to  home in stable condition. Should the patient have any post procedure difficulty they have been instructed on how to contact us for assistance.     ----------------------------------------------------------------------------------------------------------------------  There are no diagnoses linked to this encounter.   ----------------------------------------------------------------------------------------------------------------------  I am having [name] "Arlyss Queen" maintain her calcium citrate-vitamin D, acetaminophen, Probiotic Product (ALIGN PO), Vitamin D3, Multiple Vitamins-Minerals (CENTRUM SILVER 50+WOMEN PO), CRANBERRY PO, mupirocin ointment, ezetimibe, hyoscyamine, omeprazole, and atenolol. We will continue to administer denosumab.   No orders of the defined types were placed in this encounter.  Patient's Medications  New Prescriptions   No medications on file  Previous Medications   ACETAMINOPHEN (TYLENOL) 325 MG TABLET    Take 650 mg by mouth 2 (two) times daily.     ATENOLOL (TENORMIN) 25 MG TABLET    TAKE ONE TABLET EVERY DAY   CALCIUM CITRATE-VITAMIN D (CITRACAL+D) 315-200 MG-UNIT PER TABLET    Take 2 tablets by mouth daily.   CHOLECALCIFEROL (VITAMIN D3) 2000 UNITS TABS    Take 2,000 Units by mouth daily.    CRANBERRY PO    Take 2 capsules by mouth daily.    EZETIMIBE (ZETIA) 10 MG TABLET    TAKE 1 TABLET BY MOUTH DAILY   HYOSCYAMINE (ANASPAZ) 0.125 MG TBDP DISINTERGRATING TABLET    Place 1 tablet (0.125 mg total) under the tongue every 6 (six) hours as needed (as needed for esophageal spasm).   MULTIPLE VITAMINS-MINERALS (CENTRUM SILVER 50+WOMEN PO)    1 tablet by mouth every day   MUPIROCIN OINTMENT (BACTROBAN) 2 %       OMEPRAZOLE (PRILOSEC) 20 MG CAPSULE    TAKE 1 CAPSULE BY MOUTH EVERY DAY   PROBIOTIC PRODUCT (ALIGN PO)    Take 1 tablet by mouth daily.   Modified Medications   No medications on file  Discontinued Medications   No medications on file   ----------------------------------------------------------------------------------------------------------------------  Follow-up: No follow-ups on file.    Molli Barrows, MD

## 2020-06-18 NOTE — Progress Notes (Signed)
Safety precautions to be maintained throughout the outpatient stay will include: orient to surroundings, keep bed in low position, maintain call bell within reach at all times, provide assistance with transfer out of bed and ambulation.  

## 2020-06-18 NOTE — Telephone Encounter (Signed)
Attempted to call patient, message left. 

## 2020-06-18 NOTE — Patient Instructions (Signed)
____________________________________________________________________________________________  Post-Procedure Discharge Instructions  Instructions:  Apply ice:   Purpose: This will minimize any swelling and discomfort after procedure.   When: Day of procedure, as soon as you get home.  How: Fill a plastic sandwich bag with crushed ice. Cover it with a small towel and apply to injection site.  How long: (15 min on, 15 min off) Apply for 15 minutes then remove x 15 minutes.  Repeat sequence on day of procedure, until you go to bed.  Apply heat:   Purpose: To treat any soreness and discomfort from the procedure.  When: Starting the next day after the procedure.  How: Apply heat to procedure site starting the day following the procedure.  How long: May continue to repeat daily, until discomfort goes away.  Food intake: Start with clear liquids (like water) and advance to regular food, as tolerated.   Physical activities: Keep activities to a minimum for the first 8 hours after the procedure. After that, then as tolerated.  Driving: If you have received any sedation, be responsible and do not drive. You are not allowed to drive for 24 hours after having sedation.  Blood thinner: (Applies only to those taking blood thinners) You may restart your blood thinner 6 hours after your procedure.  Insulin: (Applies only to Diabetic patients taking insulin) As soon as you can eat, you may resume your normal dosing schedule.  Infection prevention: Keep procedure site clean and dry. Shower daily and clean area with soap and water.  Post-procedure Pain Diary: Extremely important that this be done correctly and accurately. Recorded information will be used to determine the next step in treatment. For the purpose of accuracy, follow these rules:  Evaluate only the area treated. Do not report or include pain from an untreated area. For the purpose of this evaluation, ignore all other areas of pain,  except for the treated area.  After your procedure, avoid taking a long nap and attempting to complete the pain diary after you wake up. Instead, set your alarm clock to go off every hour, on the hour, for the initial 8 hours after the procedure. Document the duration of the numbing medicine, and the relief you are getting from it.  Do not go to sleep and attempt to complete it later. It will not be accurate. If you received sedation, it is likely that you were given a medication that may cause amnesia. Because of this, completing the diary at a later time may cause the information to be inaccurate. This information is needed to plan your care.  Follow-up appointment: Keep your post-procedure follow-up evaluation appointment after the procedure (usually 2 weeks for most procedures, 6 weeks for radiofrequencies). DO NOT FORGET to bring you pain diary with you.   Expect: (What should I expect to see with my procedure?)  From numbing medicine (AKA: Local Anesthetics): Numbness or decrease in pain. You may also experience some weakness, which if present, could last for the duration of the local anesthetic.  Onset: Full effect within 15 minutes of injected.  Duration: It will depend on the type of local anesthetic used. On the average, 1 to 8 hours.   From steroids (Applies only if steroids were used): Decrease in swelling or inflammation. Once inflammation is improved, relief of the pain will follow.  Onset of benefits: Depends on the amount of swelling present. The more swelling, the longer it will take for the benefits to be seen. In some cases, up to 10 days.    Duration: Steroids will stay in the system x 2 weeks. Duration of benefits will depend on multiple posibilities including persistent irritating factors.  Side-effects: If present, they may typically last 2 weeks (the duration of the steroids).  Frequent: Cramps (if they occur, drink Gatorade and take over-the-counter Magnesium 450-500 mg  once to twice a day); water retention with temporary weight gain; increases in blood sugar; decreased immune system response; increased appetite.  Occasional: Facial flushing (red, warm cheeks); mood swings; menstrual changes.  Uncommon: Long-term decrease or suppression of natural hormones; bone thinning. (These are more common with higher doses or more frequent use. This is why we prefer that our patients avoid having any injection therapies in other practices.)   Very Rare: Severe mood changes; psychosis; aseptic necrosis.  From procedure: Some discomfort is to be expected once the numbing medicine wears off. This should be minimal if ice and heat are applied as instructed.  Call if: (When should I call?)  You experience numbness and weakness that gets worse with time, as opposed to wearing off.  New onset bowel or bladder incontinence. (Applies only to procedures done in the spine)  Emergency Numbers:  Durning business hours (Monday - Thursday, 8:00 AM - 4:00 PM) (Friday, 9:00 AM - 12:00 Noon): [phone]  After hours: [phone]  NOTE: If you are having a problem and are unable connect with, or to talk to a provider, then go to your nearest urgent care or emergency department. If the problem is serious and urgent, please call 911. ____________________________________________________________________________________________   Epidural Steroid Injection  An epidural steroid injection is a shot of steroid medicine and numbing medicine that is given into the space between the spinal cord and the bones of the back (epidural space). The shot helps relieve pain caused by an irritated or swollen nerve root. The amount of pain relief you get from the injection depends on what is causing the nerve to be swollen and irritated, and how long your pain lasts. You are more likely to benefit from this injection if your pain is strong and comes on suddenly rather than if you have had  long-term (chronic) pain. Tell a health care provider about:  Any allergies you have.  All medicines you are taking, including vitamins, herbs, eye drops, creams, and over-the-counter medicines.  Any problems you or family members have had with anesthetic medicines.  Any blood disorders you have.  Any surgeries you have had.  Any medical conditions you have.  Whether you are pregnant or may be pregnant. What are the risks? Generally, this is a safe procedure. However, problems may occur, including:  Headache.  Bleeding.  Infection.  Allergic reaction to medicines.  Nerve damage. What happens before the procedure? Staying hydrated Follow instructions from your health care provider about hydration, which may include:  Up to 2 hours before the procedure - you may continue to drink clear liquids, such as water, clear fruit juice, black coffee, and plain tea. Eating and drinking restrictions Follow instructions from your health care provider about eating and drinking, which may include:  8 hours before the procedure - stop eating heavy meals or foods, such as meat, fried foods, or fatty foods.  6 hours before the procedure - stop eating light meals or foods, such as toast or cereal.  6 hours before the procedure - stop drinking milk or drinks that contain milk.  2 hours before the procedure - stop drinking clear liquids. Medicines  You may be given   medicines to lower anxiety.  Ask your health care provider about: ? Changing or stopping your regular medicines. This is especially important if you are taking diabetes medicines or blood thinners. ? Taking medicines such as aspirin and ibuprofen. These medicines can thin your blood. Do not take these medicines unless your health care provider tells you to take them. ? Taking over-the-counter medicines, vitamins, herbs, and supplements.  Ask your health care provider what steps will be taken to prevent infection. General  instructions  Plan to have someone take you home from the hospital or clinic.  If you will be going home right after the procedure, plan to have someone with you for 24 hours. What happens during the procedure?  An IV will be inserted into one of your veins.  You will be given one or more of the following: ? A medicine to help you relax (sedative). ? A medicine to numb the area (local anesthetic).  You will be asked to lie on your abdomen or sit.  The injection site will be cleaned.  A needle will be inserted through your skin into the epidural space. This may cause you some discomfort. An X-ray machine will be used to guide the needle as close as possible to the affected nerve.  A steroid medicine and a local anesthetic will be injected into the epidural space.  The needle and IV will be removed.  A bandage (dressing) will be put over the injection site. The procedure may vary among health care providers and hospitals. What can I expect after the procedure? Follow these instructions at home: Injection site care  You may remove the bandage (dressing) after 24 hours.  Check your injection site every day for signs of infection. Check for: ? Redness, swelling, or pain. ? Fluid or blood. ? Warmth. ? Pus or a bad smell. Managing pain, stiffness, and swelling  For 24 hours after the procedure: ? Avoid using heat on the injection site. ? Do not take baths, swim, or use a hot tub until your health care provider approves. Ask your health care provider if you may take a shower. You may only be allowed to take sponge baths.  If directed, put ice on the injection site. To do this: ? Put ice in a plastic bag. ? Place a towel between your skin and the bag. ? Leave the ice on for 20 minutes, 2-3 times a day.  Activity  Do not drive for 24 hours if you were given a sedative during your procedure.  Return to your normal activities as told by your health care provider. Ask your  health care provider what activities are safe for you. General instructions  Your blood pressure, heart rate, breathing rate, and blood oxygen level will be monitored until you leave the hospital or clinic.  Your arm or leg may feel weak or numb for a few hours.  The injection site may feel sore.  Take over-the-counter and prescription medicines only as told by your health care provider.  Drink enough fluid to keep your urine pale yellow.  Keep all follow-up visits as told by your health care provider. This is important. Contact a health care provider if:  You have any of these signs of infection: ? Redness, swelling, or pain around your injection site. ? Fluid or blood coming from your injection site. ? Warmth coming from your injection site. ? Pus or a bad smell coming from your injection site. ? A fever.  You continue to   have pain and soreness around the injection site, even after taking over-the-counter pain medicine.  You have severe, sudden, or lasting nausea or vomiting. Get help right away if:  You have severe pain at the injection site that is not relieved by medicines.  You develop a severe headache or a stiff neck.  You become sensitive to light.  You have any new numbness or weakness in your legs or arms.  You lose control of your bladder or bowel movements.  You have trouble breathing. Summary  An epidural steroid injection is a shot of steroid medicine and numbing medicine that is given into the epidural space.  The shot helps relieve pain caused by an irritated or swollen nerve root.  You are more likely to benefit from this injection if your pain is strong and comes on suddenly rather than if you have had chronic pain. This information is not intended to replace advice given to you by your health care provider. Make sure you discuss any questions you have with your health care provider. Document Revised: 03/27/2019 Document Reviewed: 03/27/2019 Elsevier  Patient Education  2020 [employer]  

## 2020-06-19 ENCOUNTER — Telehealth: Payer: Self-pay | Admitting: *Deleted

## 2020-06-19 NOTE — Telephone Encounter (Signed)
Pt left a voicemail stating she was returning nurses call about her phone call yesterday.

## 2020-06-19 NOTE — Telephone Encounter (Signed)
Attempted to return call, message left. 

## 2020-06-19 NOTE — Telephone Encounter (Signed)
Attempted to call patient, message left. 

## 2020-06-19 NOTE — Telephone Encounter (Signed)
Scheduled

## 2020-06-19 NOTE — Telephone Encounter (Signed)
Attempted to call, message left. 

## 2020-06-28 ENCOUNTER — Emergency Department: Payer: Medicare Other

## 2020-06-28 ENCOUNTER — Other Ambulatory Visit: Payer: Self-pay

## 2020-06-28 ENCOUNTER — Encounter: Payer: Self-pay | Admitting: Radiology

## 2020-06-28 ENCOUNTER — Emergency Department
Admission: EM | Admit: 2020-06-28 | Discharge: 2020-06-29 | Disposition: A | Discharge status: Home / Self Care | Payer: Medicare Other | Attending: Emergency Medicine | Admitting: Emergency Medicine

## 2020-06-28 DIAGNOSIS — S20211A Contusion of right front wall of thorax, initial encounter: Secondary | ICD-10-CM | POA: Insufficient documentation

## 2020-06-28 DIAGNOSIS — Z96659 Presence of unspecified artificial knee joint: Secondary | ICD-10-CM | POA: Diagnosis not present

## 2020-06-28 DIAGNOSIS — C44622 Squamous cell carcinoma of skin of right upper limb, including shoulder: Secondary | ICD-10-CM | POA: Insufficient documentation

## 2020-06-28 DIAGNOSIS — S0990XA Unspecified injury of head, initial encounter: Secondary | ICD-10-CM | POA: Insufficient documentation

## 2020-06-28 DIAGNOSIS — R1084 Generalized abdominal pain: Secondary | ICD-10-CM | POA: Insufficient documentation

## 2020-06-28 DIAGNOSIS — S299XXA Unspecified injury of thorax, initial encounter: Secondary | ICD-10-CM | POA: Diagnosis present

## 2020-06-28 DIAGNOSIS — I129 Hypertensive chronic kidney disease with stage 1 through stage 4 chronic kidney disease, or unspecified chronic kidney disease: Secondary | ICD-10-CM | POA: Diagnosis not present

## 2020-06-28 DIAGNOSIS — Z79899 Other long term (current) drug therapy: Secondary | ICD-10-CM | POA: Insufficient documentation

## 2020-06-28 DIAGNOSIS — C44519 Basal cell carcinoma of skin of other part of trunk: Secondary | ICD-10-CM | POA: Insufficient documentation

## 2020-06-28 DIAGNOSIS — Y9389 Activity, other specified: Secondary | ICD-10-CM | POA: Diagnosis not present

## 2020-06-28 DIAGNOSIS — N189 Chronic kidney disease, unspecified: Secondary | ICD-10-CM | POA: Insufficient documentation

## 2020-06-28 DIAGNOSIS — C44529 Squamous cell carcinoma of skin of other part of trunk: Secondary | ICD-10-CM | POA: Diagnosis not present

## 2020-06-28 DIAGNOSIS — Y92410 Unspecified street and highway as the place of occurrence of the external cause: Secondary | ICD-10-CM | POA: Diagnosis not present

## 2020-06-28 DIAGNOSIS — C44722 Squamous cell carcinoma of skin of right lower limb, including hip: Secondary | ICD-10-CM | POA: Diagnosis not present

## 2020-06-28 LAB — COMPREHENSIVE METABOLIC PANEL
ALT: 17 U/L (ref 0–44)
AST: 22 U/L (ref 15–41)
Albumin: 4.4 g/dL (ref 3.5–5.0)
Alkaline Phosphatase: 70 U/L (ref 38–126)
Anion gap: 8 (ref 5–15)
BUN: 19 mg/dL (ref 8–23)
CO2: 28 mmol/L (ref 22–32)
Calcium: 9.8 mg/dL (ref 8.9–10.3)
Chloride: 102 mmol/L (ref 98–111)
Creatinine, Ser: 0.86 mg/dL (ref 0.44–1.00)
GFR calc Af Amer: >60 mL/min (ref >60)
GFR calc non Af Amer: >60 mL/min (ref >60)
Glucose, Bld: 123 mg/dL — ABNORMAL HIGH (ref 70–99)
Potassium: 4.1 mmol/L (ref 3.5–5.1)
Sodium: 138 mmol/L (ref 135–145)
Total Bilirubin: 1.3 mg/dL — ABNORMAL HIGH (ref 0.3–1.2)
Total Protein: 7.2 g/dL (ref 6.5–8.1)

## 2020-06-28 LAB — CBC WITH DIFFERENTIAL/PLATELET
Abs Immature Granulocytes: 0.12 10*3/uL — ABNORMAL HIGH (ref 0.00–0.07)
Basophils Absolute: 0.1 10*3/uL (ref 0.0–0.1)
Basophils Relative: 0 %
Eosinophils Absolute: 0.3 10*3/uL (ref 0.0–0.5)
Eosinophils Relative: 2 %
HCT: 37.8 % (ref 36.0–46.0)
Hemoglobin: 13.1 g/dL (ref 12.0–15.0)
Immature Granulocytes: 1 %
Lymphocytes Relative: 12 %
Lymphs Abs: 2.2 10*3/uL (ref 0.7–4.0)
MCH: 33 pg (ref 26.0–34.0)
MCHC: 34.7 g/dL (ref 30.0–36.0)
MCV: 95.2 fL (ref 80.0–100.0)
Monocytes Absolute: 0.9 10*3/uL (ref 0.1–1.0)
Monocytes Relative: 5 %
Neutro Abs: 14.2 10*3/uL — ABNORMAL HIGH (ref 1.7–7.7)
Neutrophils Relative %: 80 %
Platelets: 186 10*3/uL (ref 150–400)
RBC: 3.97 MIL/uL (ref 3.87–5.11)
RDW: 17.9 % — ABNORMAL HIGH (ref 11.5–15.5)
WBC: 17.8 10*3/uL — ABNORMAL HIGH (ref 4.0–10.5)
nRBC: 0.2 % (ref 0.0–0.2)

## 2020-06-28 MED ORDER — IOHEXOL 300 MG/ML  SOLN
100.0000 mL | Freq: Once | INTRAMUSCULAR | Status: AC | PRN
  Administered 2020-06-28: 100 mL via INTRAVENOUS
  Filled 2020-06-28: qty 100

## 2020-06-28 NOTE — ED Notes (Signed)
If ride is needed when discharged please call April Holding @ [phone]

## 2020-06-28 NOTE — ED Notes (Signed)
EKG to EDP [facility] in person.  

## 2020-06-28 NOTE — ED Notes (Addendum)
Blue, lt grn, and lav tubes sent to lab.
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
## 2020-06-28 NOTE — ED Notes (Signed)
Called lab as this RN cannot see CBC running currently although CMP running. Lab staff states one running now and starting the other now. States should be resulted soon.
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
## 2020-06-28 NOTE — ED Notes (Signed)
CT aware pt has IV access. Awaiting lab results then they'll take pt to CT.
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
## 2020-06-28 NOTE — ED Notes (Signed)
Pt assisted to restroom. Urine sample sent to lab.
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
## 2020-07-02 ENCOUNTER — Other Ambulatory Visit: Payer: Self-pay

## 2020-07-02 ENCOUNTER — Ambulatory Visit (INDEPENDENT_AMBULATORY_CARE_PROVIDER_SITE_OTHER): Payer: Medicare Other

## 2020-07-02 DIAGNOSIS — M810 Age-related osteoporosis without current pathological fracture: Secondary | ICD-10-CM

## 2020-07-02 MED ORDER — DENOSUMAB 60 MG/ML ~~LOC~~ SOSY
60.0000 mg | PREFILLED_SYRINGE | Freq: Once | SUBCUTANEOUS | Status: AC
  Administered 2020-07-02: 60 mg via SUBCUTANEOUS

## 2020-07-02 NOTE — Telephone Encounter (Signed)
Patient called in and wanted to know if these results are anything to wrong about

## 2020-07-02 NOTE — Progress Notes (Signed)
Patient presented for 6-month Prolia injection SQ to left arm. Patient tolerated well. 

## 2020-07-08 ENCOUNTER — Encounter: Payer: Self-pay | Admitting: Internal Medicine

## 2020-07-08 ENCOUNTER — Ambulatory Visit (INDEPENDENT_AMBULATORY_CARE_PROVIDER_SITE_OTHER): Payer: Medicare Other | Admitting: Internal Medicine

## 2020-07-08 ENCOUNTER — Other Ambulatory Visit: Payer: Self-pay

## 2020-07-08 VITALS — BP 156/78 | HR 60 | Temp 97.6°F | Resp 16 | Ht 66.0 in | Wt 141.0 lb

## 2020-07-08 DIAGNOSIS — S2241XD Multiple fractures of ribs, right side, subsequent encounter for fracture with routine healing: Secondary | ICD-10-CM | POA: Diagnosis not present

## 2020-07-08 DIAGNOSIS — I444 Left anterior fascicular block: Secondary | ICD-10-CM

## 2020-07-08 DIAGNOSIS — E785 Hyperlipidemia, unspecified: Secondary | ICD-10-CM | POA: Diagnosis not present

## 2020-07-08 DIAGNOSIS — I700 Atherosclerosis of aorta: Secondary | ICD-10-CM | POA: Insufficient documentation

## 2020-07-08 DIAGNOSIS — I10 Essential (primary) hypertension: Secondary | ICD-10-CM

## 2020-07-08 DIAGNOSIS — M48061 Spinal stenosis, lumbar region without neurogenic claudication: Secondary | ICD-10-CM

## 2020-07-08 DIAGNOSIS — E559 Vitamin D deficiency, unspecified: Secondary | ICD-10-CM

## 2020-07-08 DIAGNOSIS — S2241XA Multiple fractures of ribs, right side, initial encounter for closed fracture: Secondary | ICD-10-CM | POA: Insufficient documentation

## 2020-07-08 LAB — COMPREHENSIVE METABOLIC PANEL
ALT: 17 U/L (ref 0–35)
AST: 21 U/L (ref 0–37)
Albumin: 4 g/dL (ref 3.5–5.2)
Alkaline Phosphatase: 65 U/L (ref 39–117)
BUN: 18 mg/dL (ref 6–23)
CO2: 28 mEq/L (ref 19–32)
Calcium: 9.1 mg/dL (ref 8.4–10.5)
Chloride: 105 mEq/L (ref 96–112)
Creatinine, Ser: 0.77 mg/dL (ref 0.40–1.20)
GFR: 69.63 mL/min (ref >60.00)
Glucose, Bld: 91 mg/dL (ref 70–99)
Potassium: 4 mEq/L (ref 3.5–5.1)
Sodium: 140 mEq/L (ref 135–145)
Total Bilirubin: 0.8 mg/dL (ref 0.2–1.2)
Total Protein: 6.2 g/dL (ref 6.0–8.3)

## 2020-07-08 LAB — LIPID PANEL
Cholesterol: 171 mg/dL (ref 0–200)
HDL: 54.8 mg/dL (ref >39.00)
LDL Cholesterol: 94 mg/dL (ref 0–99)
NonHDL: 116.58
Total CHOL/HDL Ratio: 3
Triglycerides: 112 mg/dL (ref 0.0–149.0)
VLDL: 22.4 mg/dL (ref 0.0–40.0)

## 2020-07-08 LAB — TSH: TSH: 0.76 u[IU]/mL (ref 0.35–4.50)

## 2020-07-08 LAB — VITAMIN D 25 HYDROXY (VIT D DEFICIENCY, FRACTURES): VITD: 53.87 ng/mL (ref 30.00–100.00)

## 2020-07-08 NOTE — Assessment & Plan Note (Signed)
Noted on recent ER EKG,  With Left axis deviation noted on 2019 EKG.  Referral to cardiology for further evaluation

## 2020-07-08 NOTE — Assessment & Plan Note (Signed)
Lateral chest wall is nontender.  The anterior chest wall shows resolving ecchymoses and is nontender to light palpation . Lungs are CTA

## 2020-07-08 NOTE — Assessment & Plan Note (Signed)
she reports compliance with medication regimen  but has an elevated reading today in office.  She is not using NSAIDs daily.  Discussed goal of 130/80 for patients over 70  to preserve renal function.  She has been asked to check her  BP  at home and  submit readings for evaluation. Renal function, electrolytes and screen for proteinuria are all normal .  Lab Results  Component Value Date   CREATININE 0.86 06/28/2020   Lab Results  Component Value Date   NA 138 06/28/2020   K 4.1 06/28/2020   CL 102 06/28/2020   CO2 28 06/28/2020   Lab Results  Component Value Date   MICROALBUR 1.0 01/05/2020   MICROALBUR 1.6 06/16/2017
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
## 2020-07-08 NOTE — Patient Instructions (Addendum)
Avoid strenuous activity for the next 4 or 5 weeks to let your ribs heal  Ok to get your epidural injection in mid November  I have made a referral to Dr Harrell Gave End at [facility] cardiology for the abnormal EKG .  He is in the [place] [facility]

## 2020-07-08 NOTE — Assessment & Plan Note (Signed)
She has resumed management with Pain clinic and is receiving ESI for relief of pain . Next one is nov 18.  Ribs  should be healing by then (6 weeks), so ok to get

## 2020-07-08 NOTE — Assessment & Plan Note (Signed)
Noted on recent Chest CT done during ER Evaluation for post traumatic chest wall pain .  Discussed with patient. Continue Zetia.

## 2020-07-09 NOTE — Progress Notes (Signed)
Your vitamin D, thyroid ,  cholesterol, liver and kidney function are normal.  You do not need any medication changes. Please continue your current medications and plan to repeat the labs in 6 months.    Regards,   Dr. Kenleigh Toback 

## 2020-07-11 ENCOUNTER — Other Ambulatory Visit: Payer: Self-pay | Admitting: Internal Medicine

## 2020-07-11 ENCOUNTER — Other Ambulatory Visit: Payer: Medicare Other

## 2020-07-15 ENCOUNTER — Ambulatory Visit (INDEPENDENT_AMBULATORY_CARE_PROVIDER_SITE_OTHER): Payer: Medicare Other | Admitting: Internal Medicine

## 2020-07-15 ENCOUNTER — Encounter: Payer: Self-pay | Admitting: Internal Medicine

## 2020-07-15 ENCOUNTER — Other Ambulatory Visit: Payer: Self-pay

## 2020-07-15 VITALS — BP 128/72 | HR 65 | Temp 98.0°F | Resp 15 | Ht 66.0 in | Wt 140.0 lb

## 2020-07-15 DIAGNOSIS — S2241XD Multiple fractures of ribs, right side, subsequent encounter for fracture with routine healing: Secondary | ICD-10-CM | POA: Diagnosis not present

## 2020-07-15 DIAGNOSIS — I10 Essential (primary) hypertension: Secondary | ICD-10-CM | POA: Diagnosis not present

## 2020-07-15 DIAGNOSIS — E785 Hyperlipidemia, unspecified: Secondary | ICD-10-CM

## 2020-07-15 DIAGNOSIS — H8110 Benign paroxysmal vertigo, unspecified ear: Secondary | ICD-10-CM

## 2020-07-15 DIAGNOSIS — I444 Left anterior fascicular block: Secondary | ICD-10-CM
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
## 2020-07-15 NOTE — Assessment & Plan Note (Signed)
Seeing Dr Saunders Revel Nov 3rd.  Rare episodes of rapid pulse managed with carotid rub

## 2020-07-15 NOTE — Patient Instructions (Addendum)
I'm glad your pain is resolving.   Please schedule an app with our lab to  repeat your rib  X Rays on or around Dec 1 to make sure they are healing   Premier Protein  Is the protein drink I use  That has 50% of your calcium needs

## 2020-07-16 NOTE — Assessment & Plan Note (Signed)
Improved with more purposeful movements and slowed rising from lying position .

## 2020-07-16 NOTE — Assessment & Plan Note (Signed)
Managed with zetia due to statin intolerance .  LDL is at goal and   lfts are normal.  No changes today   Lab Results  Component Value Date   CHOL 171 07/08/2020   HDL 54.80 07/08/2020   LDLCALC 94 07/08/2020   LDLDIRECT 103.0 06/16/2017   TRIG 112.0 07/08/2020   CHOLHDL 3 07/08/2020   Lab Results  Component Value Date   ALT 17 07/08/2020   AST 21 07/08/2020   ALKPHOS 65 07/08/2020   BILITOT 0.8 07/08/2020

## 2020-07-16 NOTE — Assessment & Plan Note (Signed)
Well controlled on current regimen of atenolol 25 mg bid. . Renal function stable, no changes today.

## 2020-07-22 ENCOUNTER — Ambulatory Visit: Payer: Medicare Other | Admitting: Podiatry

## 2020-07-31 ENCOUNTER — Ambulatory Visit: Payer: Medicare Other | Admitting: Internal Medicine
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
## 2020-08-02 ENCOUNTER — Ambulatory Visit (INDEPENDENT_AMBULATORY_CARE_PROVIDER_SITE_OTHER): Payer: Medicare Other | Admitting: Internal Medicine

## 2020-08-02 ENCOUNTER — Encounter: Payer: Self-pay | Admitting: Internal Medicine

## 2020-08-02 ENCOUNTER — Other Ambulatory Visit: Payer: Self-pay

## 2020-08-02 VITALS — BP 150/70 | HR 62 | Ht 66.0 in | Wt 140.0 lb

## 2020-08-02 DIAGNOSIS — R9431 Abnormal electrocardiogram [ECG] [EKG]: Secondary | ICD-10-CM

## 2020-08-02 DIAGNOSIS — I10 Essential (primary) hypertension: Secondary | ICD-10-CM

## 2020-08-02 NOTE — Patient Instructions (Addendum)
Medication Instructions:  Your physician recommends that you continue on your current medications as directed. Please refer to the Current Medication list given to you today.  *If you need a refill on your cardiac medications before your next appointment, please call your pharmacy*   Lab Work: none If you have labs (blood work) drawn today and your tests are completely normal, you will receive your results only by: Marland Kitchen MyChart Message (if you have MyChart) OR . A paper copy in the mail If you have any lab test that is abnormal or we need to change your treatment, we will call you to review the results.   Testing/Procedures: Your physician has requested that you have an echocardiogram. Echocardiography is a painless test that uses sound waves to create images of your heart. It provides your doctor with information about the size and shape of your heart and how well your heart's chambers and valves are working. This procedure takes approximately one hour. There are no restrictions for this procedure. There is a possibility that an IV may need to be started during your test to inject an image enhancing agent. This is done to obtain more optimal pictures of your heart. Therefore we ask that you do at least drink some water prior to coming in to hydrate your veins.     Follow-Up: At [facility], you and your health needs are our priority.  As part of our continuing mission to provide you with exceptional heart care, we have created designated Provider Care Teams.  These Care Teams include your primary Cardiologist (physician) and Advanced Practice Providers (APPs -  Physician Assistants and Nurse Practitioners) who all work together to provide you with the care you need, when you need it.  We recommend signing up for the patient portal called "MyChart".  Sign up information is provided on this After Visit Summary.  MyChart is used to connect with patients for Virtual Visits (Telemedicine).   Patients are able to view lab/test results, encounter notes, upcoming appointments, etc.  Non-urgent messages can be sent to your provider as well.   To learn more about what you can do with MyChart, go to [email or web address].    Your next appointment:   As needed.    Echocardiogram An echocardiogram is a procedure that uses painless sound waves (ultrasound) to produce an image of the heart. Images from an echocardiogram can provide important information about:  Signs of coronary artery disease (CAD).  Aneurysm detection. An aneurysm is a weak or damaged part of an artery wall that bulges out from the normal force of blood pumping through the body.  Heart size and shape. Changes in the size or shape of the heart can be associated with certain conditions, including heart failure, aneurysm, and CAD.  Heart muscle function.  Heart valve function.  Signs of a past heart attack.  Fluid buildup around the heart.  Thickening of the heart muscle.  A tumor or infectious growth around the heart valves. Tell a health care provider about:  Any allergies you have.  All medicines you are taking, including vitamins, herbs, eye drops, creams, and over-the-counter medicines.  Any blood disorders you have.  Any surgeries you have had.  Any medical conditions you have.  Whether you are pregnant or may be pregnant. What are the risks? Generally, this is a safe procedure. However, problems may occur, including:  Allergic reaction to dye (contrast) that may be used during the procedure. What happens before the procedure? No specific preparation  is needed. You may eat and drink normally. What happens during the procedure?   An IV tube may be inserted into one of your veins.  You may receive contrast through this tube. A contrast is an injection that improves the quality of the pictures from your heart.  A gel will be applied to your chest.  A wand-like tool (transducer) will be  moved over your chest. The gel will help to transmit the sound waves from the transducer.  The sound waves will harmlessly bounce off of your heart to allow the heart images to be captured in real-time motion. The images will be recorded on a computer. The procedure may vary among health care providers and hospitals. What happens after the procedure?  You may return to your normal, everyday life, including diet, activities, and medicines, unless your health care provider tells you not to do that. Summary  An echocardiogram is a procedure that uses painless sound waves (ultrasound) to produce an image of the heart.  Images from an echocardiogram can provide important information about the size and shape of your heart, heart muscle function, heart valve function, and fluid buildup around your heart.  You do not need to do anything to prepare before this procedure. You may eat and drink normally.  After the echocardiogram is completed, you may return to your normal, everyday life, unless your health care provider tells you not to do that. This information is not intended to replace advice given to you by your health care provider. Make sure you discuss any questions you have with your health care provider. Document Revised: 01/05/2019 Document Reviewed: 10/17/2016 Elsevier Patient Education  [place].

## 2020-08-06 ENCOUNTER — Telehealth: Payer: Self-pay

## 2020-08-06 NOTE — Telephone Encounter (Signed)
She called to cancel her procedure in November because she is getting her covid shot and her PCP told her she should wait on the procedure. Also She fell and broke 2 ribs and wanted to let Dr. Andree Elk know. She states she is doing better though. She will r/s her procedure for January.

## 2020-08-12 ENCOUNTER — Other Ambulatory Visit: Payer: Self-pay | Admitting: Internal Medicine

## 2020-08-15 ENCOUNTER — Ambulatory Visit: Payer: Medicare Other | Admitting: Anesthesiology

## 2020-08-20 ENCOUNTER — Other Ambulatory Visit: Payer: Self-pay

## 2020-08-20 ENCOUNTER — Ambulatory Visit (INDEPENDENT_AMBULATORY_CARE_PROVIDER_SITE_OTHER): Payer: Medicare Other

## 2020-08-20 DIAGNOSIS — R9431 Abnormal electrocardiogram [ECG] [EKG]: Secondary | ICD-10-CM | POA: Diagnosis not present

## 2020-08-20 LAB — ECHOCARDIOGRAM COMPLETE
Area-P 1/2: 3.6 cm2
S' Lateral: 2.9 cm

## 2020-08-28 ENCOUNTER — Other Ambulatory Visit: Payer: Self-pay

## 2020-08-28 ENCOUNTER — Other Ambulatory Visit: Payer: Medicare Other

## 2020-08-28 ENCOUNTER — Ambulatory Visit (INDEPENDENT_AMBULATORY_CARE_PROVIDER_SITE_OTHER): Payer: Medicare Other

## 2020-08-28 DIAGNOSIS — S2241XD Multiple fractures of ribs, right side, subsequent encounter for fracture with routine healing: Secondary | ICD-10-CM | POA: Diagnosis not present

## 2020-08-29 NOTE — Progress Notes (Signed)
Your rib fractures have healed.  Regards,   Deborra Medina, MD

## 2020-09-23 ENCOUNTER — Telehealth: Payer: Self-pay | Admitting: Internal Medicine

## 2020-09-23 NOTE — Telephone Encounter (Signed)
Left message for patient to call back and schedule Medicare Annual Wellness Visit (AWV) either virtually or audio only.  Last AWV 09/24/17; please schedule at anytime with Denisa O'Brien-Blaney at [facility].   

## 2020-10-15 ENCOUNTER — Ambulatory Visit
Admission: RE | Admit: 2020-10-15 | Discharge: 2020-10-15 | Disposition: A | Discharge status: Home / Self Care | Payer: Medicare Other | Source: Ambulatory Visit | Attending: Anesthesiology | Admitting: Anesthesiology

## 2020-10-15 ENCOUNTER — Other Ambulatory Visit: Payer: Self-pay | Admitting: Anesthesiology

## 2020-10-15 ENCOUNTER — Ambulatory Visit (HOSPITAL_BASED_OUTPATIENT_CLINIC_OR_DEPARTMENT_OTHER): Payer: Medicare Other | Admitting: Anesthesiology

## 2020-10-15 ENCOUNTER — Other Ambulatory Visit: Payer: Self-pay

## 2020-10-15 ENCOUNTER — Encounter: Payer: Self-pay | Admitting: Anesthesiology

## 2020-10-15 VITALS — BP 156/77 | HR 68 | Temp 97.2°F | Resp 18 | Ht 66.0 in | Wt 138.0 lb

## 2020-10-15 DIAGNOSIS — G8929 Other chronic pain: Secondary | ICD-10-CM

## 2020-10-15 DIAGNOSIS — M48061 Spinal stenosis, lumbar region without neurogenic claudication: Secondary | ICD-10-CM

## 2020-10-15 DIAGNOSIS — M4125 Other idiopathic scoliosis, thoracolumbar region: Secondary | ICD-10-CM | POA: Insufficient documentation

## 2020-10-15 DIAGNOSIS — M79605 Pain in left leg: Secondary | ICD-10-CM

## 2020-10-15 DIAGNOSIS — M25551 Pain in right hip: Secondary | ICD-10-CM | POA: Diagnosis present

## 2020-10-15 DIAGNOSIS — M5441 Lumbago with sciatica, right side: Secondary | ICD-10-CM | POA: Insufficient documentation

## 2020-10-15 DIAGNOSIS — M5432 Sciatica, left side: Secondary | ICD-10-CM | POA: Diagnosis present

## 2020-10-15 DIAGNOSIS — M545 Low back pain, unspecified: Secondary | ICD-10-CM

## 2020-10-15 DIAGNOSIS — R52 Pain, unspecified: Secondary | ICD-10-CM | POA: Insufficient documentation

## 2020-10-15 DIAGNOSIS — M5136 Other intervertebral disc degeneration, lumbar region: Secondary | ICD-10-CM | POA: Diagnosis present

## 2020-10-15 MED ORDER — TRIAMCINOLONE ACETONIDE 40 MG/ML IJ SUSP
INTRAMUSCULAR | Status: AC
  Filled 2020-10-15: qty 1

## 2020-10-15 MED ORDER — TRIAMCINOLONE ACETONIDE 40 MG/ML IJ SUSP
40.0000 mg | Freq: Once | INTRAMUSCULAR | Status: AC
  Administered 2020-10-15: 40 mg

## 2020-10-15 MED ORDER — SODIUM CHLORIDE 0.9% FLUSH
10.0000 mL | Freq: Once | INTRAVENOUS | Status: AC
  Administered 2020-10-15: 10 mL

## 2020-10-15 MED ORDER — LIDOCAINE HCL (PF) 1 % IJ SOLN
5.0000 mL | Freq: Once | INTRAMUSCULAR | Status: AC
  Administered 2020-10-15: 5 mL via SUBCUTANEOUS

## 2020-10-15 MED ORDER — IOHEXOL 180 MG/ML  SOLN
10.0000 mL | Freq: Once | INTRAMUSCULAR | Status: AC | PRN
  Administered 2020-10-15: 10 mL via EPIDURAL

## 2020-10-15 MED ORDER — SODIUM CHLORIDE (PF) 0.9 % IJ SOLN
INTRAMUSCULAR | Status: AC
  Filled 2020-10-15: qty 10

## 2020-10-15 MED ORDER — ROPIVACAINE HCL 2 MG/ML IJ SOLN
INTRAMUSCULAR | Status: AC
  Filled 2020-10-15: qty 10

## 2020-10-15 MED ORDER — ROPIVACAINE HCL 2 MG/ML IJ SOLN
10.0000 mL | Freq: Once | INTRAMUSCULAR | Status: AC
  Administered 2020-10-15: 10 mL via EPIDURAL

## 2020-10-15 MED ORDER — LIDOCAINE HCL (PF) 1 % IJ SOLN
INTRAMUSCULAR | Status: AC
  Filled 2020-10-15: qty 10

## 2020-10-15 NOTE — Patient Instructions (Signed)
Pain Management Discharge Instructions  General Discharge Instructions :  If you need to reach your doctor call: Monday-Friday 8:00 am - 4:00 pm at [phone] or toll free [phone].  After clinic hours [phone] to have operator reach doctor.  Bring all of your medication bottles to all your appointments in the pain clinic.  To cancel or reschedule your appointment with Pain Management please remember to call 24 hours in advance to avoid a fee.  Refer to the educational materials which you have been given on: General Risks, I had my Procedure. Discharge Instructions, Post Sedation.  Post Procedure Instructions:  The drugs you were given will stay in your system until tomorrow, so for the next 24 hours you should not drive, make any legal decisions or drink any alcoholic beverages.  You may eat anything you prefer, but it is better to start with liquids then soups and crackers, and gradually work up to solid foods.  Please notify your doctor immediately if you have any unusual bleeding, trouble breathing or pain that is not related to your normal pain.  Depending on the type of procedure that was done, some parts of your body may feel week and/or numb.  This usually clears up by tonight or the next day.  Walk with the use of an assistive device or accompanied by an adult for the 24 hours.  You may use ice on the affected area for the first 24 hours.  Put ice in a Ziploc bag and cover with a towel and place against area 15 minutes on 15 minutes off.  You may switch to heat after 24 hours.Epidural Steroid Injection Patient Information  Description: The epidural space surrounds the nerves as they exit the spinal cord.  In some patients, the nerves can be compressed and inflamed by a bulging disc or a tight spinal canal (spinal stenosis).  By injecting steroids into the epidural space, we can bring irritated nerves into direct contact with a potentially helpful medication.  These  steroids act directly on the irritated nerves and can reduce swelling and inflammation which often leads to decreased pain.  Epidural steroids may be injected anywhere along the spine and from the neck to the low back depending upon the location of your pain.   After numbing the skin with local anesthetic (like Novocaine), a small needle is passed into the epidural space slowly.  You may experience a sensation of pressure while this is being done.  The entire block usually last less than 10 minutes.  Conditions which may be treated by epidural steroids:   Low back and leg pain  Neck and arm pain  Spinal stenosis  Post-laminectomy syndrome  Herpes zoster (shingles) pain  Pain from compression fractures  Preparation for the injection:  1. Do not eat any solid food or dairy products within 8 hours of your appointment.  2. You may drink clear liquids up to 3 hours before appointment.  Clear liquids include water, black coffee, juice or soda.  No milk or cream please. 3. You may take your regular medication, including pain medications, with a sip of water before your appointment  Diabetics should hold regular insulin (if taken separately) and take 1/2 normal NPH dos the morning of the procedure.  Carry some sugar containing items with you to your appointment. 4. A driver must accompany you and be prepared to drive you home after your procedure.  5. Bring all your current medications with your. 6. An IV may be inserted and   sedation may be given at the discretion of the physician.   7. A blood pressure cuff, EKG and other monitors will often be applied during the procedure.  Some patients may need to have extra oxygen administered for a short period. 8. You will be asked to provide medical information, including your allergies, prior to the procedure.  We must know immediately if you are taking blood thinners (like Coumadin/Warfarin)  Or if you are allergic to IV iodine contrast (dye). We must  know if you could possible be pregnant.  Possible side-effects:  Bleeding from needle site  Infection (rare, may require surgery)  Nerve injury (rare)  Numbness & tingling (temporary)  Difficulty urinating (rare, temporary)  Spinal headache ( a headache worse with upright posture)  Light -headedness (temporary)  Pain at injection site (several days)  Decreased blood pressure (temporary)  Weakness in arm/leg (temporary)  Pressure sensation in back/neck (temporary)  Call if you experience:  Fever/chills associated with headache or increased back/neck pain.  Headache worsened by an upright position.  New onset weakness or numbness of an extremity below the injection site  Hives or difficulty breathing (go to the emergency room)  Inflammation or drainage at the infection site  Severe back/neck pain  Any new symptoms which are concerning to you  Please note:  Although the local anesthetic injected can often make your back or neck feel good for several hours after the injection, the pain will likely return.  It takes 3-7 days for steroids to work in the epidural space.  You may not notice any pain relief for at least that one week.  If effective, we will often do a series of three injections spaced 3-6 weeks apart to maximally decrease your pain.  After the initial series, we generally will wait several months before considering a repeat injection of the same type.  If you have any questions, please call [phone] [facility] 

## 2020-10-15 NOTE — Progress Notes (Unsigned)
Safety precautions to be maintained throughout the outpatient stay will include: orient to surroundings, keep bed in low position, maintain call bell within reach at all times, provide assistance with transfer out of bed and ambulation.  

## 2020-10-16 NOTE — Progress Notes (Signed)
Subjective:  Patient ID: Brenda Cardenas, female    DOB: 01-Nov-1931  Age: 87 y.o. MRN: [id number]  CC: Back Pain (Lumbar bilateral )   Procedure: L5-S1 epidural steroid under fluoroscopic guidance with no sedation  HPI Frederick Klinger presents for reevaluation.  She continues to have recurrence of low back pain with radiation into the bilateral hips buttocks and posterior legs.  In the past she has had epidural steroids for this and these have worked effectively for her and helped eliminate 50 to 75% the pain lasting 6 to 8 weeks or more.  Her last epidural was in July and September of last year and she gained good relief from these.  She presents today requesting a repeat epidural to keep his pain under control.  Unfortunately physical therapy activity with her severe scoliosis has been ineffective and other more conservative modalities have been ineffective.  The quality characterizing distribution of low back pain has been stable otherwise.  No change in bowel or bladder function or lower extremity strength or function is noted.  He is taking Tylenol generally for pain relief and trying to stay active.  Outpatient Medications Prior to Visit  Medication Sig Dispense Refill  . acetaminophen (TYLENOL) 325 MG tablet Take 650 mg by mouth 2 (two) times daily.     Marland Kitchen atenolol (TENORMIN) 25 MG tablet TAKE ONE TABLET EVERY DAY 90 tablet 1  . calcium citrate-vitamin D (CITRACAL+D) 315-200 MG-UNIT per tablet Take 2 tablets by mouth daily.    . Cholecalciferol (VITAMIN D3) 2000 units TABS Take 2,000 Units by mouth daily.     . clobetasol ointment (TEMOVATE) 7.09 % Apply 1 application topically in the morning and at bedtime.    Marland Kitchen CRANBERRY PO Take 2 capsules by mouth daily.     Marland Kitchen ezetimibe (ZETIA) 10 MG tablet TAKE 1 TABLET BY MOUTH DAILY 90 tablet 1  . hyoscyamine (ANASPAZ) 0.125 MG TBDP disintergrating tablet Place 1 tablet (0.125 mg total) under the tongue every 6 (six) hours as needed (as needed for  esophageal spasm). 30 tablet 0  . Multiple Vitamins-Minerals (CENTRUM SILVER 50+WOMEN PO) 1 tablet by mouth every day    . mupirocin ointment (BACTROBAN) 2 %     . omeprazole (PRILOSEC) 20 MG capsule TAKE 1 CAPSULE BY MOUTH EVERY DAY 90 capsule 1  . Probiotic Product (ALIGN PO) Take 1 tablet by mouth daily.      Facility-Administered Medications Prior to Visit  Medication Dose Route Frequency Provider Last Rate Last Admin  . denosumab (PROLIA) injection 60 mg  60 mg Subcutaneous Once Einar Pheasant, MD        Review of Systems CNS: No confusion or sedation Cardiac: No angina or palpitations GI: No abdominal pain or constipation Constitutional: No nausea vomiting fevers or chills  Objective:  BP (!) 156/77   Pulse 68   Temp (!) 97.2 F (36.2 C) (Temporal)   Resp 18   Ht 5\' 6"  (1.676 m)   Wt 138 lb (62.6 kg)   SpO2 100%   BMI 22.27 kg/m    BP Readings from Last 3 Encounters:  10/15/20 (!) 156/77  08/02/20 (!) 150/70  07/15/20 128/72     Wt Readings from Last 3 Encounters:  10/15/20 138 lb (62.6 kg)  08/02/20 140 lb (63.5 kg)  07/15/20 140 lb (63.5 kg)     Physical Exam Pt is alert and oriented PERRL EOMI HEART IS RRR no murmur or rub LCTA no wheezing or rales MUSCULOSKELETAL reveals some  paraspinous muscle tenderness in the lumbar region significant scoliosis is noted.  Lower extremity strength and function is at baseline.  Muscle tone and bulk is at baseline.  She does have a positive straight leg raise on the left side.  Labs  Lab Results  Component Value Date   HGBA1C 5.4 01/05/2020   HGBA1C 5.5 06/16/2018   HGBA1C 5.7 03/24/2018   Lab Results  Component Value Date   MICROALBUR 1.0 01/05/2020   LDLCALC 94 07/08/2020   CREATININE 0.77 07/08/2020    -------------------------------------------------------------------------------------------------------------------- Lab Results  Component Value Date   WBC 17.8 (H) 06/28/2020   HGB 13.1 06/28/2020    HCT 37.8 06/28/2020   PLT 186 06/28/2020   GLUCOSE 91 07/08/2020   CHOL 171 07/08/2020   TRIG 112.0 07/08/2020   HDL 54.80 07/08/2020   LDLDIRECT 103.0 06/16/2017   LDLCALC 94 07/08/2020   ALT 17 07/08/2020   AST 21 07/08/2020   NA 140 07/08/2020   K 4.0 07/08/2020   CL 105 07/08/2020   CREATININE 0.77 07/08/2020   BUN 18 07/08/2020   CO2 28 07/08/2020   TSH 0.76 07/08/2020   HGBA1C 5.4 01/05/2020   MICROALBUR 1.0 01/05/2020    --------------------------------------------------------------------------------------------------------------------- DG PAIN CLINIC C-ARM 1-60 MIN NO REPORT  Result Date: 10/15/2020 Fluoro was used, but no Radiologist interpretation will be provided. Please refer to "NOTES" tab for provider progress note.    Assessment & Plan:   There are no diagnoses linked to this encounter.      ----------------------------------------------------------------------------------------------------------------------  Problem List Items Addressed This Visit   None       ----------------------------------------------------------------------------------------------------------------------  There are no diagnoses linked to this encounter.   ----------------------------------------------------------------------------------------------------------------------  I am having [name] maintain her calcium citrate-vitamin D, acetaminophen, Probiotic Product (ALIGN PO), Vitamin D3, Multiple Vitamins-Minerals (CENTRUM SILVER 50+WOMEN PO), CRANBERRY PO, mupirocin ointment, hyoscyamine, atenolol, ezetimibe, omeprazole, and clobetasol ointment. We administered triamcinolone acetonide, sodium chloride flush, ropivacaine (PF) 2 mg/mL (0.2%), lidocaine (PF), and iohexol. We will continue to administer denosumab.   Meds ordered this encounter  Medications  . triamcinolone acetonide (KENALOG-40) injection 40 mg  . sodium chloride flush (NS) 0.9 % injection 10 mL  .  ropivacaine (PF) 2 mg/mL (0.2%) (NAROPIN) injection 10 mL  . lidocaine (PF) (XYLOCAINE) 1 % injection 5 mL  . iohexol (OMNIPAQUE) 180 MG/ML injection 10 mL   Patient's Medications  New Prescriptions   No medications on file  Previous Medications   ACETAMINOPHEN (TYLENOL) 325 MG TABLET    Take 650 mg by mouth 2 (two) times daily.    ATENOLOL (TENORMIN) 25 MG TABLET    TAKE ONE TABLET EVERY DAY   CALCIUM CITRATE-VITAMIN D (CITRACAL+D) 315-200 MG-UNIT PER TABLET    Take 2 tablets by mouth daily.   CHOLECALCIFEROL (VITAMIN D3) 2000 UNITS TABS    Take 2,000 Units by mouth daily.    CLOBETASOL OINTMENT (TEMOVATE) 0.05 %    Apply 1 application topically in the morning and at bedtime.   CRANBERRY PO    Take 2 capsules by mouth daily.    EZETIMIBE (ZETIA) 10 MG TABLET    TAKE 1 TABLET BY MOUTH DAILY   HYOSCYAMINE (ANASPAZ) 0.125 MG TBDP DISINTERGRATING TABLET    Place 1 tablet (0.125 mg total) under the tongue every 6 (six) hours as needed (as needed for esophageal spasm).   MULTIPLE VITAMINS-MINERALS (CENTRUM SILVER 50+WOMEN PO)    1 tablet by mouth every day   MUPIROCIN OINTMENT (BACTROBAN) 2 %  OMEPRAZOLE (PRILOSEC) 20 MG CAPSULE    TAKE 1 CAPSULE BY MOUTH EVERY DAY   PROBIOTIC PRODUCT (ALIGN PO)    Take 1 tablet by mouth daily.   Modified Medications   No medications on file  Discontinued Medications   No medications on file   ----------------------------------------------------------------------------------------------------------------------  Follow-up: Return in about 2 months (around 12/13/2020) for procedure, evaluation.  1. Spinal stenosis of lumbar region without neurogenic claudication   2. DDD (degenerative disc disease), lumbar   3. Other idiopathic scoliosis, thoracolumbar region   4. Low back pain radiating to left leg   5. Sciatica, left side   6. Acute bilateral low back pain with right-sided sciatica   7. Chronic right-sided low back pain without sciatica   8. Pain of  right hip joint   Based on her discussion today we will proceed with a repeat epidural.  We have gone over the risks and benefits of the procedure with her in full detail and all questions were answered.  I have return to clinic in approxi-1 to 2 months for possible repeat epidural.  In the past we have span these out approximately 3 to 4 injections over the course of the year to keep her pain under good control.  We will proceed with this if necessary and has been reviewed with the patient.  No changes in medication management are initiated today and I want her to continue with her current activity level.  Continue follow-up with her primary care physicians for her baseline medical care.    Procedure: L5-S1 LESI with fluoroscopic guidance and no moderate sedation  NOTE: The risks, benefits, and expectations of the procedure have been discussed and explained to the patient who was understanding and in agreement with suggested treatment plan. No guarantees were made.  DESCRIPTION OF PROCEDURE: Lumbar epidural steroid injection with no IV Versed, EKG, blood pressure, pulse, and pulse oximetry monitoring. The procedure was performed with the patient in the prone position under fluoroscopic guidance.  Sterile prep x3 was initiated and I then injected subcutaneous lidocaine to the overlying L5-S1 site after its fluoroscopic identifictation.  Using strict aseptic technique, I then advanced an 18-gauge Tuohy epidural needle in the midline using interlaminar approach via loss-of-resistance to saline technique. There was negative aspiration for heme or  CSF.  I then confirmed position with both AP and Lateral fluoroscan.  2 cc of contrast dye were injected and a  total of 5 mL of Preservative-Free normal saline mixed with 40 mg of Kenalog and 1cc Ropicaine 0.2 percent were injected incrementally via the  epidurally placed needle. The needle was removed. The patient tolerated the injection well and was convalesced  and discharged to home in stable condition. Should the patient have any post procedure difficulty they have been instructed on how to contact us for assistance.   Molli Barrows, MD

## 2020-10-29 ENCOUNTER — Telehealth: Payer: Self-pay | Admitting: Internal Medicine

## 2020-10-29 ENCOUNTER — Other Ambulatory Visit (INDEPENDENT_AMBULATORY_CARE_PROVIDER_SITE_OTHER): Payer: Medicare Other

## 2020-10-29 ENCOUNTER — Other Ambulatory Visit: Payer: Self-pay

## 2020-10-29 DIAGNOSIS — R300 Dysuria: Secondary | ICD-10-CM

## 2020-10-29 LAB — URINALYSIS, ROUTINE W REFLEX MICROSCOPIC
Bilirubin Urine: NEGATIVE
Ketones, ur: NEGATIVE
Leukocytes,Ua: NEGATIVE
Nitrite: NEGATIVE
Specific Gravity, Urine: 1.02 (ref 1.000–1.030)
Urine Glucose: NEGATIVE
Urobilinogen, UA: 0.2 (ref 0.0–1.0)
pH: 7.5 (ref 5.0–8.0)

## 2020-10-29 LAB — POCT URINALYSIS DIPSTICK
Bilirubin, UA: NEGATIVE
Glucose, UA: NEGATIVE
Ketones, UA: NEGATIVE
Leukocytes, UA: NEGATIVE
Nitrite, UA: NEGATIVE
Protein, UA: POSITIVE — AB
Spec Grav, UA: 1.02 (ref 1.010–1.025)
Urobilinogen, UA: 0.2 E.U./dL
pH, UA: 7 (ref 5.0–8.0)

## 2020-10-29 NOTE — Telephone Encounter (Signed)
Spoken to patient and PCP. PCP Stated she can come in for urine lab today. But she needs to schedule appointment for f/u with PCP afterward. Orders have been placed.

## 2020-10-29 NOTE — Telephone Encounter (Signed)
Patient called stated that she has UTI need to give a urine sample today

## 2020-10-29 NOTE — Telephone Encounter (Signed)
Please call patient to triage   Pt called back wanting to know why she hasn't been contacted to schedule a urine. Pt said that [facility] can't see her til May for this issue. She wanted to speak to Dr. Derrel Nip personally about this and what was the hold up for her not being contacted.. I explained to her that Dr. Derrel Nip doesn't come in til 1pm on Tuesday and we have no appts available with any provider today. She said that she is in great pain. I suggested that she goes to an UC  she declined and said that she wasn't in enough pain to go to UC  Also wanted a call back from [place] as well to follow up

## 2020-10-30 ENCOUNTER — Telehealth: Payer: Self-pay | Admitting: Internal Medicine

## 2020-10-30 LAB — URINE CULTURE
MICRO NUMBER:: 11481418
SPECIMEN QUALITY:: ADEQUATE

## 2020-10-30 NOTE — Progress Notes (Signed)
Thus far, There is no evidence of UTI by urinalysis.  However the culture is still pending  And will take another 24 hours.   Regards,   Riley Hallum, MD    

## 2020-10-30 NOTE — Telephone Encounter (Signed)
Patient called about her lab results from 10/29/2020, please call her.

## 2020-10-30 NOTE — Telephone Encounter (Signed)
Patient called in requesting medication for her urinary symptoms she has been experiencing. Patient was frustrated that she could not see the urinary culture that was submitted. I informed her that the culture has not resulted yet and that we have not reviewed her labs and we are waiting on Dr. Derrel Nip to assess the results and prescribe a medication. Patient has an appointment on 11/04/2020. Patient verbalized understanding and had no further questions.

## 2020-10-31 NOTE — Progress Notes (Signed)
There is no evidence of UTI by urinalysis or by  culture results.

## 2020-11-04 ENCOUNTER — Other Ambulatory Visit: Payer: Self-pay

## 2020-11-04 ENCOUNTER — Ambulatory Visit (INDEPENDENT_AMBULATORY_CARE_PROVIDER_SITE_OTHER): Payer: Medicare Other | Admitting: Internal Medicine

## 2020-11-04 VITALS — BP 132/76 | HR 72 | Temp 98.1°F | Ht 65.98 in | Wt 138.0 lb

## 2020-11-04 DIAGNOSIS — E559 Vitamin D deficiency, unspecified: Secondary | ICD-10-CM | POA: Diagnosis not present

## 2020-11-04 DIAGNOSIS — I519 Heart disease, unspecified: Secondary | ICD-10-CM | POA: Diagnosis not present

## 2020-11-04 DIAGNOSIS — E785 Hyperlipidemia, unspecified: Secondary | ICD-10-CM

## 2020-11-04 DIAGNOSIS — F411 Generalized anxiety disorder: Secondary | ICD-10-CM

## 2020-11-04 DIAGNOSIS — R350 Frequency of micturition: Secondary | ICD-10-CM | POA: Diagnosis not present

## 2020-11-04 DIAGNOSIS — I10 Essential (primary) hypertension: Secondary | ICD-10-CM

## 2020-11-04 DIAGNOSIS — D72829 Elevated white blood cell count, unspecified: Secondary | ICD-10-CM

## 2020-11-04 NOTE — Patient Instructions (Addendum)
You can schedule a lab appointment  For a urinalysis and culture the next time you have symptoms of a UTI   While  you are waiting. You can use AZO for the pain/discomfort , until the UA and culture get finalized .  This available  OTC   You can continue the cranberry tablets in between epidurals , to PREVENT UTI

## 2020-11-04 NOTE — Progress Notes (Signed)
Subjective:  Patient ID: Brenda Cardenas, female    DOB: 10/20/1931  Age: 87 y.o. MRN: [id number]  CC: The primary encounter diagnosis was Urinary frequency. Diagnoses of Left ventricular diastolic dysfunction, Hyperlipidemia LDL goal <130, Hypovitaminosis D, Essential hypertension, Leukocytosis, unspecified type, and GAD (generalized anxiety disorder) were also pertinent to this visit.  HPI Brenda Cardenas presents for follow up on multiple issues  This visit occurred during the SARS-CoV-2 public health emergency.  Safety protocols were in place, including screening questions prior to the visit, additional usage of staff PPE, and extensive cleaning of exam room while observing appropriate contact time as indicated for disinfecting solutions.   UA and culture were done on Feb 1 and were negative/ normal. She requested testing due to the development of  Symptoms that date back to Dec 24 during void , noticed blood on toilet paper .  Diagnosed with lichen planus by GYN.  Prescribed clobetasol for one month  using clobetasol now in addition to estrogen cream to the urethra  For management of atrophic vaginitis. On Jan 18 had ESI in the  L5-S1  For pain Radiating to buttocks and was pain free for ten days.  Jan 29 developed urethral pain, improved with increased water intake. For several days, and now the back  pain returned.   Symptoms have been intermittent and returned last night ,  But frequency only   She is apprehensive about having more ESI's because she is concerned that the procedure caused the urinary symptoms  Gabapentin offered but declined .  Has delayed  recurrenct use of NUSTEP since her injection   Anxiety: long discussion today about her uncontrolled anxiety, compounded by prior family dynamics,  Current social situation     Outpatient Medications Prior to Visit  Medication Sig Dispense Refill   acetaminophen (TYLENOL) 325 MG tablet Take 650 mg by mouth 2 (two) times daily.       atenolol (TENORMIN) 25 MG tablet TAKE ONE TABLET EVERY DAY 90 tablet 1   calcium citrate-vitamin D (CITRACAL+D) 315-200 MG-UNIT per tablet Take 2 tablets by mouth daily.     Cholecalciferol (VITAMIN D3) 2000 units TABS Take 2,000 Units by mouth daily.      clobetasol ointment (TEMOVATE) 8.50 % Apply 1 application topically in the morning and at bedtime.     CRANBERRY PO Take 2 capsules by mouth daily.      estradiol (ESTRACE) 0.1 MG/GM vaginal cream estradiol 0.01% (0.1 mg/gram) vaginal cream     ezetimibe (ZETIA) 10 MG tablet TAKE 1 TABLET BY MOUTH DAILY 90 tablet 1   hyoscyamine (ANASPAZ) 0.125 MG TBDP disintergrating tablet Place 1 tablet (0.125 mg total) under the tongue every 6 (six) hours as needed (as needed for esophageal spasm). 30 tablet 0   Multiple Vitamins-Minerals (CENTRUM SILVER 50+WOMEN PO) 1 tablet by mouth every day     mupirocin ointment (BACTROBAN) 2 %      omeprazole (PRILOSEC) 20 MG capsule TAKE 1 CAPSULE BY MOUTH EVERY DAY 90 capsule 1   Probiotic Product (ALIGN PO) Take 1 tablet by mouth daily.      Facility-Administered Medications Prior to Visit  Medication Dose Route Frequency Provider Last Rate Last Admin   denosumab (PROLIA) injection 60 mg  60 mg Subcutaneous Once Einar Pheasant, MD        Review of Systems;  Patient denies headache, fevers, malaise, unintentional weight loss, skin rash, eye pain, sinus congestion and sinus pain, sore throat, dysphagia,  hemoptysis , cough,  dyspnea, wheezing, chest pain, palpitations, orthopnea, edema, abdominal pain, nausea, melena, diarrhea, constipation, flank pain, dysuria, hematuria, urinary  Frequency, nocturia, numbness, tingling, seizures,  Focal weakness, Loss of consciousness,  Tremor, insomnia, depression, anxiety, and suicidal ideation.      Objective:  BP 132/76 (BP Location: Left Arm, Patient Position: Sitting)    Pulse 72    Temp 98.1 F (36.7 C)    Ht 5' 5.98" (1.676 m)    Wt 138 lb (62.6 kg)  Comment: Patient Reported. She declined weighing in building   SpO2 99%    BMI 22.28 kg/m   BP Readings from Last 3 Encounters:  11/04/20 132/76  10/15/20 (!) 156/77  08/02/20 (!) 150/70    Wt Readings from Last 3 Encounters:  11/04/20 138 lb (62.6 kg)  10/15/20 138 lb (62.6 kg)  08/02/20 140 lb (63.5 kg)    General appearance: alert, cooperative and appears stated age Ears: normal TM's and external ear canals both ears Throat: lips, mucosa, and tongue normal; teeth and gums normal Neck: no adenopathy, no carotid bruit, supple, symmetrical, trachea midline and thyroid not enlarged, symmetric, no tenderness/mass/nodules Back: symmetric, no curvature. ROM normal. No CVA tenderness. Lungs: clear to auscultation bilaterally Heart: regular rate and rhythm, S1, S2 normal, no murmur, click, rub or gallop Abdomen: soft, non-tender; bowel sounds normal; no masses,  no organomegaly Pulses: 2+ and symmetric Skin: Skin color, texture, turgor normal. No rashes or lesions Lymph nodes: Cervical, supraclavicular, and axillary nodes normal.  Lab Results  Component Value Date   HGBA1C 5.4 01/05/2020   HGBA1C 5.5 06/16/2018   HGBA1C 5.7 03/24/2018    Lab Results  Component Value Date   CREATININE 0.77 07/08/2020   CREATININE 0.86 06/28/2020   CREATININE 0.74 01/05/2020    Lab Results  Component Value Date   WBC 17.8 (H) 06/28/2020   HGB 13.1 06/28/2020   HCT 37.8 06/28/2020   PLT 186 06/28/2020   GLUCOSE 91 07/08/2020   CHOL 171 07/08/2020   TRIG 112.0 07/08/2020   HDL 54.80 07/08/2020   LDLDIRECT 103.0 06/16/2017   LDLCALC 94 07/08/2020   ALT 17 07/08/2020   AST 21 07/08/2020   NA 140 07/08/2020   K 4.0 07/08/2020   CL 105 07/08/2020   CREATININE 0.77 07/08/2020   BUN 18 07/08/2020   CO2 28 07/08/2020   TSH 0.76 07/08/2020   HGBA1C 5.4 01/05/2020   MICROALBUR 1.0 01/05/2020    DG PAIN CLINIC C-ARM 1-60 MIN NO REPORT  Result Date: 10/15/2020 Fluoro was used, but no  Radiologist interpretation will be provided. Please refer to "NOTES" tab for provider progress note.   Assessment & Plan:   Problem List Items Addressed This Visit      Unprioritized   Essential hypertension   Relevant Orders   Comprehensive metabolic panel   GAD (generalized anxiety disorder)    Chronic, lifelong.  Defers treatment despite long discussion today      Hyperlipidemia LDL goal <130   Relevant Orders   Lipid panel   TSH   Hypovitaminosis D   Relevant Orders   VITAMIN D 25 Hydroxy (Vit-D Deficiency, Fractures)   Left ventricular diastolic dysfunction   Urinary frequency - Primary    Answered all questions about the potential etiologies of her symptoms,  She is extremely fearful of having a UTi that wil go untreated for any length of time.  Reassurance provided  Will keep a standing order for testing  Relevant Orders   Urinalysis, Routine w reflex microscopic   Urine Culture    Other Visit Diagnoses    Leukocytosis, unspecified type       Relevant Orders   CBC with Differential/Platelet     I provided  30 minutes of  face-to-face time during this encounter reviewing patient's current problems and past surgeries, labs and imaging studies, providing counseling on the above mentioned problems , and coordination  of care .  I am having [name] maintain her calcium citrate-vitamin D, acetaminophen, Probiotic Product (ALIGN PO), Vitamin D3, Multiple Vitamins-Minerals (CENTRUM SILVER 50+WOMEN PO), CRANBERRY PO, mupirocin ointment, hyoscyamine, atenolol, ezetimibe, omeprazole, clobetasol ointment, and estradiol. We will continue to administer denosumab.  No orders of the defined types were placed in this encounter.   There are no discontinued medications.  Follow-up: No follow-ups on file.   Crecencio Mc, MD

## 2020-11-05 DIAGNOSIS — F411 Generalized anxiety disorder: Secondary | ICD-10-CM | POA: Insufficient documentation

## 2020-11-05 DIAGNOSIS — R350 Frequency of micturition: Secondary | ICD-10-CM | POA: Insufficient documentation

## 2020-11-05 NOTE — Assessment & Plan Note (Addendum)
Answered all questions about the potential etiologies of her symptoms,  She is extremely fearful of having a UTi that wil go untreated for any length of time.  Reassurance provided  Will keep a standing order for testing

## 2020-11-05 NOTE — Assessment & Plan Note (Signed)
Chronic, lifelong.  Defers treatment despite long discussion today

## 2020-11-15 ENCOUNTER — Ambulatory Visit: Payer: Medicare Other | Attending: Anesthesiology | Admitting: Anesthesiology

## 2020-11-15 ENCOUNTER — Other Ambulatory Visit: Payer: Self-pay

## 2020-11-15 DIAGNOSIS — M48061 Spinal stenosis, lumbar region without neurogenic claudication: Secondary | ICD-10-CM | POA: Diagnosis not present

## 2020-11-15 DIAGNOSIS — M545 Low back pain, unspecified: Secondary | ICD-10-CM | POA: Diagnosis not present

## 2020-11-15 DIAGNOSIS — M4125 Other idiopathic scoliosis, thoracolumbar region: Secondary | ICD-10-CM | POA: Diagnosis not present

## 2020-11-15 DIAGNOSIS — G8929 Other chronic pain: Secondary | ICD-10-CM

## 2020-11-15 DIAGNOSIS — M5432 Sciatica, left side: Secondary | ICD-10-CM

## 2020-11-15 DIAGNOSIS — M5441 Lumbago with sciatica, right side: Secondary | ICD-10-CM

## 2020-11-15 DIAGNOSIS — M5136 Other intervertebral disc degeneration, lumbar region: Secondary | ICD-10-CM | POA: Diagnosis not present

## 2020-11-15 DIAGNOSIS — M25551 Pain in right hip: Secondary | ICD-10-CM

## 2020-11-15 DIAGNOSIS — M79605 Pain in left leg: Secondary | ICD-10-CM

## 2020-11-17 ENCOUNTER — Encounter: Payer: Self-pay | Admitting: Anesthesiology

## 2020-11-17 NOTE — Progress Notes (Signed)
Virtual Visit via Telephone Note  I connected with Brenda Cardenas on 11/17/20 at  1:15 PM EST by telephone and verified that I am speaking with the correct person using two identifiers.  Location: Patient: Home Provider: [facility] control center   I discussed the limitations, risks, security and privacy concerns of performing an evaluation and management service by telephone and the availability of in person appointments. I also discussed with the patient that there may be a patient responsible charge related to this service. The patient expressed understanding and agreed to proceed.   History of Present Illness: I spoke with Ms. Mena today via telephone as she was unable to do the video portion of the virtual conference.  She reports that she did quite well following her last epidural.  She got nearly 100% pain relief of her low back pain which lasted approximately 2 weeks.  She does report that she had some urinary related issues that came up around the 2-week mark and she was checked for a urinary tract infection.  The urinalysis was negative and the symptoms have abated.  She has had recurrence of a similar low back pain to her baseline which does limit her activity and functioning.  She is requesting a repeat epidural if possible.  She queries as to whether this may have caused some of her urinary tract symptoms that initiated at the 2-week mark following the injection.  Otherwise the quality characteristic and distribution of her low back pain are stable.  No change in lower extremity strength or function or bowel or bladder function is noted at this time.  She is taking her Tylenol as prescribed and otherwise states that she is doing well.   Observations/Objective:  Current Outpatient Medications:  .  acetaminophen (TYLENOL) 325 MG tablet, Take 650 mg by mouth 2 (two) times daily. , Disp: , Rfl:  .  atenolol (TENORMIN) 25 MG tablet, TAKE ONE TABLET EVERY DAY, Disp: 90 tablet, Rfl: 1 .  calcium  citrate-vitamin D (CITRACAL+D) 315-200 MG-UNIT per tablet, Take 2 tablets by mouth daily., Disp: , Rfl:  .  Cholecalciferol (VITAMIN D3) 2000 units TABS, Take 2,000 Units by mouth daily. , Disp: , Rfl:  .  clobetasol ointment (TEMOVATE) 9.70 %, Apply 1 application topically in the morning and at bedtime., Disp: , Rfl:  .  CRANBERRY PO, Take 2 capsules by mouth daily. , Disp: , Rfl:  .  estradiol (ESTRACE) 0.1 MG/GM vaginal cream, estradiol 0.01% (0.1 mg/gram) vaginal cream, Disp: , Rfl:  .  ezetimibe (ZETIA) 10 MG tablet, TAKE 1 TABLET BY MOUTH DAILY, Disp: 90 tablet, Rfl: 1 .  hyoscyamine (ANASPAZ) 0.125 MG TBDP disintergrating tablet, Place 1 tablet (0.125 mg total) under the tongue every 6 (six) hours as needed (as needed for esophageal spasm)., Disp: 30 tablet, Rfl: 0 .  Multiple Vitamins-Minerals (CENTRUM SILVER 50+WOMEN PO), 1 tablet by mouth every day, Disp: , Rfl:  .  mupirocin ointment (BACTROBAN) 2 %, , Disp: , Rfl:  .  omeprazole (PRILOSEC) 20 MG capsule, TAKE 1 CAPSULE BY MOUTH EVERY DAY, Disp: 90 capsule, Rfl: 1 .  Probiotic Product (ALIGN PO), Take 1 tablet by mouth daily. , Disp: , Rfl:   Current Facility-Administered Medications:  .  denosumab (PROLIA) injection 60 mg, 60 mg, Subcutaneous, Once, Einar Pheasant, MD  Assessment and Plan: 1. Spinal stenosis of lumbar region without neurogenic claudication   2. DDD (degenerative disc disease), lumbar   3. Other idiopathic scoliosis, thoracolumbar region   4.  Low back pain radiating to left leg   5. Sciatica, left side   6. Acute bilateral low back pain with right-sided sciatica   7. Chronic right-sided low back pain without sciatica   8. Pain of right hip joint   Based on our discussion today I feel that she would be a candidate for repeat epidural injection.  We talked about the risks and benefits of this.  I do not feel that the urinary tract symptoms that she experienced were related to the epidural as they occurred 2 weeks  after that injection and resolved spontaneously approximately 1 week after they presented.  At this point she states that her urinary status is back to baseline but she has had frequent urinary symptoms historically.  I have encouraged her to follow-up with her primary care physician regarding this.  No other changes will be initiated in her pain management protocol and we will have her scheduled for repeat epidural in approximately 1 month.  Follow Up Instructions:    I discussed the assessment and treatment plan with the patient. The patient was provided an opportunity to ask questions and all were answered. The patient agreed with the plan and demonstrated an understanding of the instructions.   The patient was advised to call back or seek an in-person evaluation if the symptoms worsen or if the condition fails to improve as anticipated.  I provided 30 minutes of non-face-to-face time during this encounter.   Molli Barrows, MD

## 2020-12-06 ENCOUNTER — Other Ambulatory Visit: Payer: Self-pay | Admitting: Internal Medicine

## 2020-12-26 ENCOUNTER — Encounter: Payer: Self-pay | Admitting: *Deleted

## 2020-12-26 ENCOUNTER — Telehealth: Payer: Self-pay | Admitting: Internal Medicine

## 2020-12-26 NOTE — Telephone Encounter (Signed)
Patient due for Prolia injection on or after 12/31/20 please schedule left patient message to return call and schedule.
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
## 2021-01-09 ENCOUNTER — Other Ambulatory Visit (INDEPENDENT_AMBULATORY_CARE_PROVIDER_SITE_OTHER): Payer: Medicare Other

## 2021-01-09 ENCOUNTER — Other Ambulatory Visit: Payer: Self-pay

## 2021-01-09 DIAGNOSIS — E785 Hyperlipidemia, unspecified: Secondary | ICD-10-CM

## 2021-01-09 DIAGNOSIS — I10 Essential (primary) hypertension: Secondary | ICD-10-CM | POA: Diagnosis not present

## 2021-01-09 DIAGNOSIS — R350 Frequency of micturition: Secondary | ICD-10-CM | POA: Diagnosis not present

## 2021-01-09 DIAGNOSIS — D72829 Elevated white blood cell count, unspecified: Secondary | ICD-10-CM

## 2021-01-09 DIAGNOSIS — E559 Vitamin D deficiency, unspecified: Secondary | ICD-10-CM

## 2021-01-09 LAB — LIPID PANEL
Cholesterol: 180 mg/dL (ref 0–200)
HDL: 56.8 mg/dL
LDL Cholesterol: 97 mg/dL (ref 0–99)
NonHDL: 123.33
Total CHOL/HDL Ratio: 3
Triglycerides: 132 mg/dL (ref 0.0–149.0)
VLDL: 26.4 mg/dL (ref 0.0–40.0)

## 2021-01-09 LAB — CBC WITH DIFFERENTIAL/PLATELET
Basophils Absolute: 0.1 K/uL (ref 0.0–0.1)
Basophils Relative: 1.1 % (ref 0.0–3.0)
Eosinophils Absolute: 0.5 K/uL (ref 0.0–0.7)
Eosinophils Relative: 5.5 % — ABNORMAL HIGH (ref 0.0–5.0)
HCT: 35.9 % — ABNORMAL LOW (ref 36.0–46.0)
Hemoglobin: 12.2 g/dL (ref 12.0–15.0)
Lymphocytes Relative: 27.1 % (ref 12.0–46.0)
Lymphs Abs: 2.6 K/uL (ref 0.7–4.0)
MCHC: 33.9 g/dL (ref 30.0–36.0)
MCV: 97.6 fl (ref 78.0–100.0)
Monocytes Absolute: 0.5 K/uL (ref 0.1–1.0)
Monocytes Relative: 4.7 % (ref 3.0–12.0)
Neutro Abs: 6 K/uL (ref 1.4–7.7)
Neutrophils Relative %: 61.6 % (ref 43.0–77.0)
Platelets: 170 K/uL (ref 150.0–400.0)
RBC: 3.68 Mil/uL — ABNORMAL LOW (ref 3.87–5.11)
RDW: 19.8 % — ABNORMAL HIGH (ref 11.5–15.5)
WBC: 9.7 K/uL (ref 4.0–10.5)

## 2021-01-09 LAB — TSH: TSH: 0.79 u[IU]/mL (ref 0.35–4.50)

## 2021-01-09 LAB — COMPREHENSIVE METABOLIC PANEL WITH GFR
ALT: 27 U/L (ref 0–35)
AST: 24 U/L (ref 0–37)
Albumin: 3.8 g/dL (ref 3.5–5.2)
Alkaline Phosphatase: 63 U/L (ref 39–117)
BUN: 23 mg/dL (ref 6–23)
CO2: 30 meq/L (ref 19–32)
Calcium: 10.1 mg/dL (ref 8.4–10.5)
Chloride: 104 meq/L (ref 96–112)
Creatinine, Ser: 0.86 mg/dL (ref 0.40–1.20)
GFR: 60.88 mL/min
Glucose, Bld: 87 mg/dL (ref 70–99)
Potassium: 4.2 meq/L (ref 3.5–5.1)
Sodium: 140 meq/L (ref 135–145)
Total Bilirubin: 0.8 mg/dL (ref 0.2–1.2)
Total Protein: 6.2 g/dL (ref 6.0–8.3)

## 2021-01-09 LAB — URINALYSIS, ROUTINE W REFLEX MICROSCOPIC
Bilirubin Urine: NEGATIVE
Hgb urine dipstick: NEGATIVE
Ketones, ur: NEGATIVE
Leukocytes,Ua: NEGATIVE
Nitrite: NEGATIVE
RBC / HPF: NONE SEEN (ref 0–?)
Specific Gravity, Urine: 1.02 (ref 1.000–1.030)
Total Protein, Urine: NEGATIVE
Urine Glucose: NEGATIVE
Urobilinogen, UA: 0.2 (ref 0.0–1.0)
pH: 6 (ref 5.0–8.0)

## 2021-01-09 LAB — VITAMIN D 25 HYDROXY (VIT D DEFICIENCY, FRACTURES): VITD: 63.22 ng/mL (ref 30.00–100.00)

## 2021-01-10 LAB — URINE CULTURE
MICRO NUMBER:: 11771159
SPECIMEN QUALITY:: ADEQUATE

## 2021-01-12 NOTE — Progress Notes (Signed)
Attached are your recent fasting labs,  We will discuss in detail at your upcoming visit.  They look fine. There is no evidence of UTI by UA and culture .   Regards,   Deborra Medina, MD

## 2021-01-13 ENCOUNTER — Other Ambulatory Visit: Payer: Medicare Other

## 2021-01-15 ENCOUNTER — Other Ambulatory Visit: Payer: Self-pay

## 2021-01-15 ENCOUNTER — Ambulatory Visit (INDEPENDENT_AMBULATORY_CARE_PROVIDER_SITE_OTHER): Payer: Medicare Other | Admitting: Internal Medicine

## 2021-01-15 ENCOUNTER — Ambulatory Visit (INDEPENDENT_AMBULATORY_CARE_PROVIDER_SITE_OTHER): Payer: Medicare Other

## 2021-01-15 ENCOUNTER — Encounter: Payer: Self-pay | Admitting: Internal Medicine

## 2021-01-15 VITALS — BP 128/62 | Temp 97.6°F | Resp 15 | Ht 65.0 in | Wt 135.0 lb

## 2021-01-15 DIAGNOSIS — S2241XD Multiple fractures of ribs, right side, subsequent encounter for fracture with routine healing: Secondary | ICD-10-CM | POA: Diagnosis not present

## 2021-01-15 DIAGNOSIS — E559 Vitamin D deficiency, unspecified: Secondary | ICD-10-CM | POA: Diagnosis not present

## 2021-01-15 DIAGNOSIS — M810 Age-related osteoporosis without current pathological fracture: Secondary | ICD-10-CM | POA: Diagnosis not present

## 2021-01-15 DIAGNOSIS — M5432 Sciatica, left side: Secondary | ICD-10-CM | POA: Diagnosis not present

## 2021-01-15 DIAGNOSIS — Z0000 Encounter for general adult medical examination without abnormal findings: Secondary | ICD-10-CM

## 2021-01-15 DIAGNOSIS — I10 Essential (primary) hypertension: Secondary | ICD-10-CM
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
## 2021-01-15 NOTE — Progress Notes (Signed)
 Subjective:   Vesta Milazzo is a 87 y.o. female who presents for Medicare Annual (Subsequent) preventive examination.  Review of Systems    No ROS.  Medicare Wellness Virtual Visit.    Cardiac Risk Factors include: advanced age (>55men, >65 women);hypertension     Objective:    Today's Vitals   01/15/21 1004  BP: 128/62  Resp: 15  Temp: 97.6 F (36.4 C)  TempSrc: Oral  Weight: 135 lb (61.2 kg)  Height: 5' 5" (1.651 m)   Body mass index is 22.47 kg/m.  Advanced Directives 01/15/2021 04/15/2020 08/22/2018 04/20/2018 02/28/2018 11/01/2017 10/29/2017  Does Patient Have a Medical Advance Directive? Yes Yes Yes Yes Yes No Yes  Type of Advance Directive Healthcare Power of Attorney;Living will Living will;Healthcare Power of Attorney Healthcare Power of Attorney;Living will Healthcare Power of Attorney Healthcare Power of Attorney Healthcare Power of Attorney Healthcare Power of Attorney;Living will  Does patient want to make changes to medical advance directive? No - Patient declined - No - Patient declined No - Patient declined - Yes (Inpatient - patient requests chaplain consult to change a medical advance directive) No - Patient declined  Copy of Healthcare Power of Attorney in Chart? Yes - validated most recent copy scanned in chart (See row information) No - copy requested Yes - validated most recent copy scanned in chart (See row information) Yes Yes - No - copy requested    Current Medications (verified) Outpatient Encounter Medications as of 01/15/2021  Medication Sig  . acetaminophen (TYLENOL) 325 MG tablet Take 650 mg by mouth 2 (two) times daily.   . atenolol (TENORMIN) 25 MG tablet TAKE ONE TABLET EVERY DAY  . calcium citrate-vitamin D (CITRACAL+D) 315-200 MG-UNIT per tablet Take 2 tablets by mouth daily.  . chlorhexidine (PERIDEX) 0.12 % solution SMARTSIG:15 Capsule(s) By Mouth Twice Daily  . Cholecalciferol (VITAMIN D3) 2000 units TABS Take 2,000 Units by mouth daily.   .  clobetasol ointment (TEMOVATE) 0.05 % Apply 1 application topically in the morning and at bedtime.  . CRANBERRY PO Take 2 capsules by mouth daily.   . estradiol (ESTRACE) 0.1 MG/GM vaginal cream estradiol 0.01% (0.1 mg/gram) vaginal cream  . ezetimibe (ZETIA) 10 MG tablet TAKE 1 TABLET BY MOUTH DAILY  . hyoscyamine (ANASPAZ) 0.125 MG TBDP disintergrating tablet Place 1 tablet (0.125 mg total) under the tongue every 6 (six) hours as needed (as needed for esophageal spasm).  . Multiple Vitamins-Minerals (CENTRUM SILVER 50+WOMEN PO) 1 tablet by mouth every day  . mupirocin ointment (BACTROBAN) 2 %   . omeprazole (PRILOSEC) 20 MG capsule TAKE 1 CAPSULE BY MOUTH EVERY DAY  . Probiotic Product (ALIGN PO) Take 1 tablet by mouth daily.    Facility-Administered Encounter Medications as of 01/15/2021  Medication  . denosumab (PROLIA) injection 60 mg    Allergies (verified) Ciprofloxacin, Clindamycin/lincomycin, Sulfa antibiotics, Augmentin [amoxicillin-pot clavulanate], Naproxen sodium, Tramadol, Codeine, Doxycycline hyclate, E-mycin [erythromycin], Macrobid [nitrofurantoin], Nabumetone, Nitrofurantoin monohyd macro, Nsaids, Phenobarbital, and Statins   History: Past Medical History:  Diagnosis Date  . Arthritis   . Basal cell carcinoma 06/04/2010   left upper back  . Chronic kidney disease   . Colon polyps   . Gastritis   . GERD (gastroesophageal reflux disease)   . Hiatal hernia   . Hyperlipidemia   . Hypertension   . IBS (irritable bowel syndrome)   . Low back pain radiating to left leg 03/28/2020  . Sciatica, left side 04/15/2020  . Squamous cell carcinoma of skin   06/26/2013   left mid pretibial  . Squamous cell carcinoma of skin 08/21/2015   lower sternum  . Squamous cell carcinoma of skin 10/14/2017   right mid pretibial  . Squamous cell carcinoma of skin 03/17/2018   right prox lateral elbow  . Squamous cell carcinoma of skin 09/07/2018   right post thigh  . Squamous cell  carcinoma of skin 05/11/2019   right mid med pretibial  . Squamous cell carcinoma of skin 06/20/2019   right bicep   Past Surgical History:  Procedure Laterality Date  . APPENDECTOMY  1959  . CARPAL TUNNEL RELEASE    . CATARACT EXTRACTION W/ INTRAOCULAR LENS IMPLANT Left   . CATARACT EXTRACTION W/PHACO Right 04/20/2018   Procedure: CATARACT EXTRACTION PHACO AND INTRAOCULAR LENS PLACEMENT ([facility])  RIGHT;  Surgeon: Leandrew Koyanagi, MD;  Location: [place];  Service: Ophthalmology;  Laterality: Right;  . CHOLECYSTECTOMY  1995  . GANGLION CYST EXCISION    . JOINT REPLACEMENT Right July 2009   [facility]   . KNEE ARTHROSCOPY    . LITHOTRIPSY    . SALIVARY GLAND SURGERY    . SKIN CANCER EXCISION    . TONSILLECTOMY    . TONSILLECTOMY AND ADENOIDECTOMY    . VEIN LIGATION     Family History  Problem Relation Age of Onset  . Heart disease Mother   . Arthritis Mother   . Diabetes Father    Social History   Socioeconomic History  . Marital status: Single    Spouse name: Not on file  . Number of children: Not on file  . Years of education: Not on file  . Highest education level: Not on file  Occupational History  . Not on file  Tobacco Use  . Smoking status: Never Smoker  . Smokeless tobacco: Never Used  Vaping Use  . Vaping Use: Never used  Substance and Sexual Activity  . Alcohol use: No    Alcohol/week: 0.0 standard drinks  . Drug use: No  . Sexual activity: Never  Other Topics Concern  . Not on file  Social History Narrative  . Not on file   Social Determinants of Health   Financial Resource Strain: Low Risk   . Difficulty of Paying Living Expenses: Not hard at all  Food Insecurity: No Food Insecurity  . Worried About Charity fundraiser in the Last Year: Never true  . Ran Out of Food in the Last Year: Never true  Transportation Needs: No Transportation Needs  . Lack of Transportation (Medical): No  . Lack of Transportation (Non-Medical): No  Physical  Activity: Insufficiently Active  . Days of Exercise per Week: 3 days  . Minutes of Exercise per Session: 30 min  Stress: No Stress Concern Present  . Feeling of Stress : Not at all  Social Connections: Unknown  . Frequency of Communication with Friends and Family: More than three times a week  . Frequency of Social Gatherings with Friends and Family: Not on file  . Attends Religious Services: Not on file  . Active Member of Clubs or Organizations: Not on file  . Attends Archivist Meetings: Not on file  . Marital Status: Not on file    Tobacco Counseling Counseling given: Not Answered   Clinical Intake:  Pre-visit preparation completed: Yes        Diabetes: No  How often do you need to have someone help you when you read instructions, pamphlets, or other written materials from your doctor  or pharmacy?: 1 - Never  Interpreter Needed?: No      Activities of Daily Living In your present state of health, do you have any difficulty performing the following activities: 01/15/2021  Hearing? Y  Vision? N  Difficulty concentrating or making decisions? N  Walking or climbing stairs? Y  Dressing or bathing? N  Doing errands, shopping? N  Preparing Food and eating ? N  Using the Toilet? N  In the past six months, have you accidently leaked urine? N  Do you have problems with loss of bowel control? N  Managing your Medications? N  Managing your Finances? N  Housekeeping or managing your Housekeeping? N  Some recent data might be hidden    Patient Care Team: Tullo, Teresa L, MD as PCP - General (Internal Medicine)  Indicate any recent Medical Services you may have received from other than [facility] providers in the past year (date may be approximate).     Assessment:   This is a routine wellness examination for Aeisha.  Hearing/Vision screen  Hearing Screening   125Hz 250Hz 500Hz 1000Hz 2000Hz 3000Hz 4000Hz 6000Hz 8000Hz  Right ear:           Left ear:            Comments: Hearing aids  Vision Screening Comments: Followed by [facility]  Wears corrective lenses  Cataract extraction, bilateral Virtual visit   Dietary issues and exercise activities discussed: Current Exercise Habits: Home exercise routine, Time (Minutes): 30, Frequency (Times/Week): 3, Weekly Exercise (Minutes/Week): 90, Intensity: Mild  Goals    . Maintain Healthy Lifestyle      Depression Screen PHQ 2/9 Scores 01/15/2021 11/04/2020 06/18/2020 03/28/2020 01/11/2020 08/12/2018 02/28/2018  PHQ - 2 Score 0 0 0 0 0 0 0  PHQ- 9 Score 0 - - - - - -  Exception Documentation - - - - - - -    Fall Risk Fall Risk  01/15/2021 11/04/2020 10/15/2020 07/15/2020 07/08/2020  Falls in the past year? 1 0 0 0 0  Comment - - - - -  Number falls in past yr: 0 0 0 - -  Injury with Fall? 0 0 - - -  Comment - - - - -  Risk Factor Category  - - - - -  Risk for fall due to : - - No Fall Risks - -  Risk for fall due to: Comment - - - - -  Follow up Falls evaluation completed Falls evaluation completed Falls evaluation completed Falls evaluation completed Falls evaluation completed    FALL RISK PREVENTION PERTAINING TO THE HOME: Handrails in use when climbing stairs? Yes Home free of loose throw rugs in walkways, pet beds, electrical cords, etc? Yes  Adequate lighting in your home to reduce risk of falls? Yes   ASSISTIVE DEVICES UTILIZED TO PREVENT FALLS: Use of a cane, walker or w/c? Yes   TIMED UP AND GO: Was the test performed? No . Virtual visit.   Cognitive Function: Patient is alert and oriented x3.  Denies difficulty, making decisions, memory loss.  MMSE/6CIT deferred. Normal by direct communication/observation.  MMSE - Mini Mental State Exam 09/24/2017 06/23/2016  Orientation to time 5 5  Orientation to Place 5 5  Registration 3 3  Attention/ Calculation 5 5  Recall 3 3  Language- name 2 objects 2 2  Language- repeat 1 1  Language- follow 3 step command 3 3  Language- read &  follow direction 1 1  Write   a sentence 1 1  Copy design 1 1  Total score 30 30       Immunizations Immunization History  Administered Date(s) Administered  . Fluad Quad(high Dose 65+) 06/09/2019, 07/08/2020  . Influenza Whole 06/15/2013  . Influenza, High Dose Seasonal PF 06/24/2015, 06/22/2016, 06/23/2017, 06/02/2018  . Influenza,inj,Quad PF,6+ Mos 06/18/2014  . MMR 03/28/2009  . Moderna Sars-Covid-2 Vaccination 10/12/2019, 11/09/2019, 08/13/2020  . Pneumococcal Conjugate-13 06/18/2014  . Pneumococcal Polysaccharide-23 06/15/2013, 03/24/2018  . Pneumococcal-Unspecified 05/29/1997, 05/29/2013, 05/29/2014  . Tdap 09/28/2008, 03/28/2009, 03/28/2012, 10/17/2017  . Zoster 01/05/2008  . Zoster Recombinat (Shingrix) 07/08/2018, 10/04/2018   Health Maintenance There are no preventive care reminders to display for this patient. Health Maintenance  Topic Date Due  . COVID-19 Vaccine (4 - Booster for Moderna series) 02/10/2021  . INFLUENZA VACCINE  04/28/2021  . TETANUS/TDAP  10/18/2027  . DEXA SCAN  Completed  . PNA vac Low Risk Adult  Completed  . HPV VACCINES  Aged Out   Colorectal cancer screening: No longer required.   Mammogram status: No longer required due to aged out.  Bone Density status: Completed 04/04/20. Results reflect: Bone density results: OSTEOPOROSIS. Repeat every 2 years. Prolia injection.  Lung Cancer Screening: (Low Dose CT Chest recommended if Age 67-80 years, 30 pack-year currently smoking OR have quit w/in 15years.) does not qualify.    Hepatitis C Screening: does not qualify.  Vision Screening: Recommended annual ophthalmology exams for early detection of glaucoma and other disorders of the eye. Is the patient up to date with their annual eye exam?  Yes   Dental Screening: Recommended annual dental exams for proper oral hygiene.  Community Resource Referral / Chronic Care Management: CRR required this visit?  No   CCM required this visit?  No       Plan:    Keep all routine maintenance appointments.   I have personally reviewed and noted the following in the patient's chart:   . Medical and social history . Use of alcohol, tobacco or illicit drugs  . Current medications and supplements . Functional ability and status . Nutritional status . Physical activity . Advanced directives . List of other physicians . Hospitalizations, surgeries, and ER visits in previous 12 months . Vitals . Screenings to include cognitive, depression, and falls . Referrals and appointments  In addition, I have reviewed and discussed with patient certain preventive protocols, quality metrics, and best practice recommendations. A written personalized care plan for preventive services as well as general preventive health recommendations were provided to patient via mychart.     Varney Biles, LPN   [phone]

## 2021-01-15 NOTE — Patient Instructions (Addendum)
Continue the calcium /D supplement  You can reduce the additional calcium supplement to every other day until gone.  When you have finished your bottle,  You can buy a lower dose (1000 Ius ) daily    You can reschedule your Prolia for 3 months from now    Atrophic Vaginitis  Atrophic vaginitis is when the lining of the vagina becomes dry and thin. This is most common in women who have stopped having their periods (are in menopause). It usually starts when a woman is 68 to 85 years old. What are the causes? This condition is caused by a drop in a female hormone (estrogen). What increases the risk? You are more likely to develop this condition if:  You take certain medicines.  You have had your ovaries taken out.  You are being treated for cancer.  You have given birth or are breastfeeding.  You are more than 85 years old.  You smoke. What are the signs or symptoms?  Pain during sex.  A feeling of pressure during sex.  Bleeding during sex.  Burning or itching in the vagina.  Burning pain when you pee (urinate).  Fluid coming from your vagina. Some people do not have symptoms. How is this treated?  Using a lubricant before sex.  Using a moisturizer in the vagina.  Using estrogen in the vagina. In some cases, you may not need treatment. Follow these instructions at home: Medicines  Take all medicines only as told by your doctor. This includes medicines for dryness.  Do not use herbal medicines unless your doctor says it is okay. General instructions  Talk with your doctor about treatment.  Do not douche.  Do not use scented: ? Sprays. ? Tampons. ? Soaps.  If sex hurts, try using lubricants right before you have sex. Contact a doctor if:  You have fluid coming from the vagina that is not like normal.  You have a bad smell coming from your vagina.  You have new symptoms.  Your symptoms do not get better when treated.  Your symptoms get  worse. Summary  This condition happens when the lining of the vagina becomes dry and thin.  It is most common in women who no longer have periods.  Treatment may include using medicines for dryness.  Call a doctor if your symptoms do not get better. This information is not intended to replace advice given to you by your health care provider. Make sure you discuss any questions you have with your health care provider. Document Revised: 03/14/2020 Document Reviewed: 03/14/2020 Elsevier Patient Education  [place].

## 2021-01-15 NOTE — Patient Instructions (Addendum)
Brenda Cardenas , Thank you for taking time to come for your Medicare Wellness Visit. I appreciate your ongoing commitment to your health goals. Please review the following plan we discussed and let me know if I can assist you in the future.   These are the goals we discussed: Goals    . Maintain Healthy Lifestyle       This is a list of the screening recommended for you and due dates:  Health Maintenance  Topic Date Due  . COVID-19 Vaccine (4 - Booster for Moderna series) 02/10/2021  . Flu Shot  04/28/2021  . Tetanus Vaccine  10/18/2027  . DEXA scan (bone density measurement)  Completed  . Pneumonia vaccines  Completed  . HPV Vaccine  Aged Out    Advanced directives: on file  Conditions/risks identified: none new  Follow up in one year for your annual wellness visit    Preventive Care 65 Years and Older, Female Preventive care refers to lifestyle choices and visits with your health care provider that can promote health and wellness. What does preventive care include?  A yearly physical exam. This is also called an annual well check.  Dental exams once or twice a year.  Routine eye exams. Ask your health care provider how often you should have your eyes checked.  Personal lifestyle choices, including:  Daily care of your teeth and gums.  Regular physical activity.  Eating a healthy diet.  Avoiding tobacco and drug use.  Limiting alcohol use.  Practicing safe sex.  Taking low-dose aspirin every day.  Taking vitamin and mineral supplements as recommended by your health care provider. What happens during an annual well check? The services and screenings done by your health care provider during your annual well check will depend on your age, overall health, lifestyle risk factors, and family history of disease. Counseling  Your health care provider may ask you questions about your:  Alcohol use.  Tobacco use.  Drug use.  Emotional well-being.  Home and  relationship well-being.  Sexual activity.  Eating habits.  History of falls.  Memory and ability to understand (cognition).  Work and work Statistician.  Reproductive health. Screening  You may have the following tests or measurements:  Height, weight, and BMI.  Blood pressure.  Lipid and cholesterol levels. These may be checked every 5 years, or more frequently if you are over 14 years old.  Skin check.  Lung cancer screening. You may have this screening every year starting at age 37 if you have a 30-pack-year history of smoking and currently smoke or have quit within the past 15 years.  Fecal occult blood test (FOBT) of the stool. You may have this test every year starting at age 33.  Flexible sigmoidoscopy or colonoscopy. You may have a sigmoidoscopy every 5 years or a colonoscopy every 10 years starting at age 8.  Hepatitis C blood test.  Hepatitis B blood test.  Sexually transmitted disease (STD) testing.  Diabetes screening. This is done by checking your blood sugar (glucose) after you have not eaten for a while (fasting). You may have this done every 1-3 years.  Bone density scan. This is done to screen for osteoporosis. You may have this done starting at age 85.  Mammogram. This may be done every 1-2 years. Talk to your health care provider about how often you should have regular mammograms. Talk with your health care provider about your test results, treatment options, and if necessary, the need for more tests.  Vaccines  Your health care provider may recommend certain vaccines, such as:  Influenza vaccine. This is recommended every year.  Tetanus, diphtheria, and acellular pertussis (Tdap, Td) vaccine. You may need a Td booster every 10 years.  Zoster vaccine. You may need this after age 58.  Pneumococcal 13-valent conjugate (PCV13) vaccine. One dose is recommended after age 100.  Pneumococcal polysaccharide (PPSV23) vaccine. One dose is recommended after  age 15. Talk to your health care provider about which screenings and vaccines you need and how often you need them. This information is not intended to replace advice given to you by your health care provider. Make sure you discuss any questions you have with your health care provider. Document Released: 10/11/2015 Document Revised: 06/03/2016 Document Reviewed: 07/16/2015 Elsevier Interactive Patient Education  2017 [place] Prevention in the Home Falls can cause injuries. They can happen to people of all ages. There are many things you can do to make your home safe and to help prevent falls. What can I do on the outside of my home?  Regularly fix the edges of walkways and driveways and fix any cracks.  Remove anything that might make you trip as you walk through a door, such as a raised step or threshold.  Trim any bushes or trees on the path to your home.  Use bright outdoor lighting.  Clear any walking paths of anything that might make someone trip, such as rocks or tools.  Regularly check to see if handrails are loose or broken. Make sure that both sides of any steps have handrails.  Any raised decks and porches should have guardrails on the edges.  Have any leaves, snow, or ice cleared regularly.  Use sand or salt on walking paths during winter.  Clean up any spills in your garage right away. This includes oil or grease spills. What can I do in the bathroom?  Use night lights.  Install grab bars by the toilet and in the tub and shower. Do not use towel bars as grab bars.  Use non-skid mats or decals in the tub or shower.  If you need to sit down in the shower, use a plastic, non-slip stool.  Keep the floor dry. Clean up any water that spills on the floor as soon as it happens.  Remove soap buildup in the tub or shower regularly.  Attach bath mats securely with double-sided non-slip rug tape.  Do not have throw rugs and other things on the floor that can  make you trip. What can I do in the bedroom?  Use night lights.  Make sure that you have a light by your bed that is easy to reach.  Do not use any sheets or blankets that are too big for your bed. They should not hang down onto the floor.  Have a firm chair that has side arms. You can use this for support while you get dressed.  Do not have throw rugs and other things on the floor that can make you trip. What can I do in the kitchen?  Clean up any spills right away.  Avoid walking on wet floors.  Keep items that you use a lot in easy-to-reach places.  If you need to reach something above you, use a strong step stool that has a grab bar.  Keep electrical cords out of the way.  Do not use floor polish or wax that makes floors slippery. If you must use wax, use non-skid floor wax.  Do not have throw rugs and other things on the floor that can make you trip. What can I do with my stairs?  Do not leave any items on the stairs.  Make sure that there are handrails on both sides of the stairs and use them. Fix handrails that are broken or loose. Make sure that handrails are as long as the stairways.  Check any carpeting to make sure that it is firmly attached to the stairs. Fix any carpet that is loose or worn.  Avoid having throw rugs at the top or bottom of the stairs. If you do have throw rugs, attach them to the floor with carpet tape.  Make sure that you have a light switch at the top of the stairs and the bottom of the stairs. If you do not have them, ask someone to add them for you. What else can I do to help prevent falls?  Wear shoes that:  Do not have high heels.  Have rubber bottoms.  Are comfortable and fit you well.  Are closed at the toe. Do not wear sandals.  If you use a stepladder:  Make sure that it is fully opened. Do not climb a closed stepladder.  Make sure that both sides of the stepladder are locked into place.  Ask someone to hold it for you,  if possible.  Clearly mark and make sure that you can see:  Any grab bars or handrails.  First and last steps.  Where the edge of each step is.  Use tools that help you move around (mobility aids) if they are needed. These include:  Canes.  Walkers.  Scooters.  Crutches.  Turn on the lights when you go into a dark area. Replace any light bulbs as soon as they burn out.  Set up your furniture so you have a clear path. Avoid moving your furniture around.  If any of your floors are uneven, fix them.  If there are any pets around you, be aware of where they are.  Review your medicines with your doctor. Some medicines can make you feel dizzy. This can increase your chance of falling. Ask your doctor what other things that you can do to help prevent falls. This information is not intended to replace advice given to you by your health care provider. Make sure you discuss any questions you have with your health care provider. Document Released: 07/11/2009 Document Revised: 02/20/2016 Document Reviewed: 10/19/2014 Elsevier Interactive Patient Education  2017 [employer].

## 2021-01-17 NOTE — Assessment & Plan Note (Addendum)
T scores stable/unchanged by 2021.  continue prolia;  Current injection due April 2022 will be postponed for 3 months until July 2022  per oral surgery's request

## 2021-01-17 NOTE — Assessment & Plan Note (Signed)
Well controlled on current regimen of atenolol 25 mg bid. . Renal function stable, no changes today.  Lab Results  Component Value Date   CREATININE 0.86 01/09/2021   Lab Results  Component Value Date   NA 140 01/09/2021   K 4.2 01/09/2021   CL 104 01/09/2021   CO2 30 01/09/2021

## 2021-01-17 NOTE — Assessment & Plan Note (Addendum)
Repeat films of 9th and 10th ribs in Dec 2021 note resolution with  cartilaginous calcification

## 2021-01-17 NOTE — Assessment & Plan Note (Signed)
Secondary to spinal stenosis.  She has resumed management with Pain clinic and is receiving ESI for relief of pain .

## 2021-01-17 NOTE — Assessment & Plan Note (Signed)
Resolved with aggressive supplementation.  Advised to reduce dose by 50%  today for the next several summer months   Last vitamin D Lab Results  Component Value Date   VD25OH 63.22 01/09/2021

## 2021-02-20 ENCOUNTER — Other Ambulatory Visit: Payer: Self-pay | Admitting: Internal Medicine

## 2021-03-05 ENCOUNTER — Other Ambulatory Visit: Payer: Self-pay | Admitting: Internal Medicine

## 2021-03-24 ENCOUNTER — Other Ambulatory Visit: Payer: Self-pay | Admitting: Anesthesiology

## 2021-03-24 DIAGNOSIS — M79605 Pain in left leg: Secondary | ICD-10-CM

## 2021-03-24 DIAGNOSIS — M48061 Spinal stenosis, lumbar region without neurogenic claudication: Secondary | ICD-10-CM

## 2021-03-24 DIAGNOSIS — M5136 Other intervertebral disc degeneration, lumbar region: Secondary | ICD-10-CM

## 2021-03-28 LAB — HM MAMMOGRAPHY

## 2021-04-08 ENCOUNTER — Other Ambulatory Visit: Payer: Self-pay

## 2021-04-08 ENCOUNTER — Telehealth: Payer: Self-pay

## 2021-04-08 ENCOUNTER — Ambulatory Visit (INDEPENDENT_AMBULATORY_CARE_PROVIDER_SITE_OTHER): Payer: Medicare Other

## 2021-04-08 DIAGNOSIS — M810 Age-related osteoporosis without current pathological fracture: Secondary | ICD-10-CM

## 2021-04-08 MED ORDER — DENOSUMAB 60 MG/ML ~~LOC~~ SOSY
60.0000 mg | PREFILLED_SYRINGE | Freq: Once | SUBCUTANEOUS | Status: AC
  Administered 2021-04-08: 60 mg via SUBCUTANEOUS

## 2021-04-08 NOTE — Telephone Encounter (Signed)
Pt states that she was returning your call about prolia injection

## 2021-04-08 NOTE — Progress Notes (Signed)
Patient presented for 6-month Prolia injection SQ to right arm. Patient tolerated well. 

## 2021-04-12 ENCOUNTER — Other Ambulatory Visit: Payer: Self-pay | Admitting: Internal Medicine

## 2021-04-18 ENCOUNTER — Ambulatory Visit: Payer: Medicare Other

## 2021-05-03 ENCOUNTER — Emergency Department
Admission: EM | Admit: 2021-05-03 | Discharge: 2021-05-03 | Disposition: A | Discharge status: Home / Self Care | Payer: Medicare Other | Attending: Emergency Medicine | Admitting: Emergency Medicine

## 2021-05-03 ENCOUNTER — Other Ambulatory Visit: Payer: Self-pay

## 2021-05-03 DIAGNOSIS — Z85828 Personal history of other malignant neoplasm of skin: Secondary | ICD-10-CM | POA: Insufficient documentation

## 2021-05-03 DIAGNOSIS — I129 Hypertensive chronic kidney disease with stage 1 through stage 4 chronic kidney disease, or unspecified chronic kidney disease: Secondary | ICD-10-CM | POA: Diagnosis not present

## 2021-05-03 DIAGNOSIS — Z79899 Other long term (current) drug therapy: Secondary | ICD-10-CM | POA: Diagnosis not present

## 2021-05-03 DIAGNOSIS — I83892 Varicose veins of left lower extremities with other complications: Secondary | ICD-10-CM | POA: Diagnosis not present

## 2021-05-03 DIAGNOSIS — N189 Chronic kidney disease, unspecified: Secondary | ICD-10-CM | POA: Insufficient documentation

## 2021-05-03 DIAGNOSIS — Z96651 Presence of right artificial knee joint: Secondary | ICD-10-CM | POA: Insufficient documentation

## 2021-05-03 DIAGNOSIS — I83899 Varicose veins of unspecified lower extremities with other complications: Secondary | ICD-10-CM

## 2021-05-03 MED ORDER — LIDOCAINE-EPINEPHRINE 2 %-1:100000 IJ SOLN: 20.0000 mL | Freq: Once | INTRAMUSCULAR | Status: DC

## 2021-05-03 NOTE — Discharge Instructions (Addendum)
When changing the Tegaderm, try to avoid pulling the mesh away from the skin.  If bleeding reoccurs, apply mesh and apply pressure. If bleeding continues after 30 minutes, return to the ER.

## 2021-05-03 NOTE — ED Triage Notes (Signed)
Pt presents to ED ambulatory c/o left foot laceration. Reports on vein so laceration is bleeding. Unable to visualize at desk.

## 2021-05-03 NOTE — ED Provider Notes (Signed)
[facility] Emergency Department Provider Note  ____________________________________________  Time seen: Approximately 11:34 AM  I have reviewed the triage vital signs and the nursing notes.   HISTORY  Chief Complaint Laceration   HPI Brenda Cardenas is a 87 y.o. female presents to the emergency department for treatment and evaluation of bleeding of a varicose vein on the left foot.  Symptoms started this morning around 8.  She was unable to get the bleeding controlled and therefore decided to come to the emergency department.  She applied a nonstick dressing and compression in addition to her sock and shoe prior to leaving her house and believes that the bleeding has now stopped.  Past Medical History:  Diagnosis Date   Arthritis    Basal cell carcinoma 06/04/2010   left upper back   Chronic kidney disease    Colon polyps    Gastritis    GERD (gastroesophageal reflux disease)    Hiatal hernia    Hyperlipidemia    Hypertension    IBS (irritable bowel syndrome)    Low back pain radiating to left leg 03/28/2020   Sciatica, left side 04/15/2020   Squamous cell carcinoma of skin 06/26/2013   left mid pretibial   Squamous cell carcinoma of skin 08/21/2015   lower sternum   Squamous cell carcinoma of skin 10/14/2017   right mid pretibial   Squamous cell carcinoma of skin 03/17/2018   right prox lateral elbow   Squamous cell carcinoma of skin 09/07/2018   right post thigh   Squamous cell carcinoma of skin 05/11/2019   right mid med pretibial   Squamous cell carcinoma of skin 06/20/2019   right bicep    Patient Active Problem List   Diagnosis Date Noted   Urinary frequency 11/05/2020   GAD (generalized anxiety disorder) 11/05/2020   Left ventricular diastolic dysfunction [id number]   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   Multiple closed fractures of ribs of right side 07/08/2020   Aortic atherosclerosis ([place]) 07/08/2020   Primary  osteoarthritis of left knee 04/21/2020   Sciatica, left side 04/15/2020   Low back pain radiating to left leg 03/28/2020   BPPV (benign paroxysmal positional vertigo) 03/14/2019   Educated about COVID-19 virus infection 03/14/2019   Hyperlipidemia LDL goal <130 06/20/2018   Varicose veins of bilateral lower extremities with other complications Q000111Q   Incomplete emptying of bladder 02/17/2017   Constipation 07/14/2016   Hypovitaminosis D 06/23/2016   Venous stasis of both lower extremities 06/23/2016   Essential hypertension 12/24/2015   History of colonic polyps 12/24/2015   Nocturnal leg cramps 12/24/2015   Spinal stenosis of lumbar region 08/13/2015   Idiopathic scoliosis 06/24/2015   Rhinitis due food 12/17/2014   Osteoarthritis of cervical spine 02/25/2014   Osteoporosis 06/18/2013   Encounter for Medicare annual wellness exam 06/18/2013   Hiatal hernia 06/16/2013   Diaphragmatic hernia 06/16/2013   GERD (gastroesophageal reflux disease) 05/02/2013   S/P total knee replacement 05/02/2013   Abnormal weight gain 05/02/2013   Artificial knee joint present 05/02/2013   Feces contents abnormal 05/02/2013   History of urinary stone 11/03/2012    Past Surgical History:  Procedure Laterality Date   APPENDECTOMY  1959   CARPAL TUNNEL RELEASE     CATARACT EXTRACTION W/ INTRAOCULAR LENS IMPLANT Left    CATARACT EXTRACTION W/PHACO Right 04/20/2018   Procedure: CATARACT EXTRACTION PHACO AND INTRAOCULAR LENS PLACEMENT ([place])  RIGHT;  Surgeon: Leandrew Koyanagi, MD;  Location: [facility];  Service:  Ophthalmology;  Laterality: Right;   CHOLECYSTECTOMY  1995   GANGLION CYST EXCISION     JOINT REPLACEMENT Right July 2009   [place]    KNEE ARTHROSCOPY     LITHOTRIPSY     SALIVARY GLAND SURGERY     SKIN CANCER EXCISION     TONSILLECTOMY     TONSILLECTOMY AND ADENOIDECTOMY     VEIN LIGATION      Prior to Admission medications   Medication Sig Start Date End Date  Taking? Authorizing Provider  acetaminophen (TYLENOL) 325 MG tablet Take 650 mg by mouth 2 (two) times daily.     [provider]  atenolol (TENORMIN) 25 MG tablet TAKE ONE TABLET EVERY DAY 03/06/21   Crecencio Mc, MD  calcium citrate-vitamin D (CITRACAL+D) 315-200 MG-UNIT per tablet Take 2 tablets by mouth daily.    [provider]  chlorhexidine (PERIDEX) 0.12 % solution SMARTSIG:15 Capsule(s) By Mouth Twice Daily 01/13/21   [provider]  Cholecalciferol (VITAMIN D3) 2000 units TABS Take 2,000 Units by mouth daily.     [provider]  clobetasol ointment (TEMOVATE) [id number] % Apply 1 application topically in the morning and at bedtime. 09/24/20   [provider]  CRANBERRY PO Take 2 capsules by mouth daily.     [provider]  estradiol (ESTRACE) 0.1 MG/GM vaginal cream estradiol 0.01% (0.1 mg/gram) vaginal cream    [provider]  ezetimibe (ZETIA) 10 MG tablet TAKE 1 TABLET BY MOUTH DAILY 04/14/21   Crecencio Mc, MD  hyoscyamine (ANASPAZ) 0.125 MG TBDP disintergrating tablet Place 1 tablet (0.125 mg total) under the tongue every 6 (six) hours as needed (as needed for esophageal spasm). 01/11/20   Crecencio Mc, MD  Multiple Vitamins-Minerals (CENTRUM SILVER 50+WOMEN PO) 1 tablet by mouth every day    [provider]  mupirocin ointment (BACTROBAN) 2 %  03/17/18   [provider]  omeprazole (PRILOSEC) 20 MG capsule TAKE 1 CAPSULE BY MOUTH EVERY DAY 02/20/21   Crecencio Mc, MD  Probiotic Product (ALIGN PO) Take 1 tablet by mouth daily.     [provider]    Allergies Ciprofloxacin, Clindamycin/lincomycin, Sulfa antibiotics, Augmentin [amoxicillin-pot clavulanate], Naproxen sodium, Tramadol, Codeine, Doxycycline hyclate, E-mycin [erythromycin], Macrobid [nitrofurantoin], Nabumetone, Nitrofurantoin monohyd macro, Nsaids, Phenobarbital, and Statins  Family History  Problem Relation Age of Onset    Heart disease Mother    Arthritis Mother    Diabetes Father     Social History Social History   Tobacco Use   Smoking status: Never   Smokeless tobacco: Never  Vaping Use   Vaping Use: Never used  Substance Use Topics   Alcohol use: No    Alcohol/week: 0.0 standard drinks   Drug use: No    Review of Systems  Constitutional: Negative for fever. Respiratory: Negative for cough or shortness of breath.  Musculoskeletal: Negative for myalgias Skin: Positive for bleeding from varicose vein on left foot Neurological: Negative for numbness or paresthesias. ____________________________________________   PHYSICAL EXAM:  VITAL SIGNS: ED Triage Vitals  Enc Vitals Group     BP 05/03/21 1036 (!) 171/111     Pulse Rate 05/03/21 1036 64     Resp 05/03/21 1036 20     Temp 05/03/21 1036 97.9 F (36.6 C)     Temp Source 05/03/21 1036 Oral     SpO2 05/03/21 1036 98 %     Weight 05/03/21 1036 135 lb (61.2 kg)  Height 05/03/21 1036 '5\' 6"'$  (1.676 m)     Head Circumference --      Peak Flow --      Pain Score 05/03/21 1044 0     Pain Loc --      Pain Edu? --      Excl. in [place]? --      Constitutional: Overall well appearing. Eyes: Conjunctivae are clear without discharge or drainage. Nose: No rhinorrhea noted. Mouth/Throat: Airway is patent.  Neck: No stridor. Unrestricted range of motion observed. Cardiovascular: Capillary refill is <3 seconds.  Respiratory: Respirations are even and unlabored.. Musculoskeletal: Unrestricted range of motion observed. Neurologic: Awake, alert, and oriented x 4.  Skin: No active bleeding from the superficial partial skin avulsion over the lateral aspect of the left foot.  ____________________________________________   LABS (all labs ordered are listed, but only abnormal results are displayed)  Labs Reviewed - No data to display ____________________________________________  EKG  Not  indicated. ____________________________________________  RADIOLOGY  Not indicated ____________________________________________   PROCEDURES  Procedures ____________________________________________   INITIAL IMPRESSION / ASSESSMENT AND PLAN / ED COURSE  Shawntavia Mueck is a 87 y.o. female presents to the emergency department for treatment and evaluation of bleeding from the left foot.  She states that she scraped a "white spot" on her foot which caused it to bleed and she was unable to get it controlled therefore she decided to come to the emergency department.  No active bleeding on initial exam.  Small piece of Surgicel and Tegaderm applied over the area in hopes to prevent rebleed.  She will leave this in place until Monday and have the staff nurse at her independent living facility, and change the bandage or recheck it on Monday.   Pertinent labs & imaging results that were available during my care of the patient were reviewed by me and considered in my medical decision making (see chart for details).  ____________________________________________   FINAL CLINICAL IMPRESSION(S) / ED DIAGNOSES  Final diagnoses:  Bleeding from varicose vein    ED Discharge Orders     None        Note:  This document was prepared using Dragon voice recognition software and may include unintentional dictation errors.   Victorino Dike, FNP 05/03/21 1534    Delman Kitten, MD 05/03/21 [phone]

## 2021-05-03 NOTE — ED Notes (Signed)
Pt present to the ED from home. Pt states that she had a "white spot" on her foot and she picked at it and it started bleeding. Pt states that it was bleeding from 0830 till she got here. Bleeding controlled now and pt still able to ambulate on that foot.

## 2021-05-05 ENCOUNTER — Ambulatory Visit
Admission: RE | Admit: 2021-05-05 | Discharge: 2021-05-05 | Disposition: A | Discharge status: Home / Self Care | Payer: Medicare Other | Source: Ambulatory Visit | Attending: Anesthesiology | Admitting: Anesthesiology

## 2021-05-05 ENCOUNTER — Other Ambulatory Visit: Payer: Self-pay | Admitting: Anesthesiology

## 2021-05-05 ENCOUNTER — Ambulatory Visit (HOSPITAL_BASED_OUTPATIENT_CLINIC_OR_DEPARTMENT_OTHER): Payer: Medicare Other | Admitting: Anesthesiology

## 2021-05-05 ENCOUNTER — Encounter: Payer: Self-pay | Admitting: Anesthesiology

## 2021-05-05 ENCOUNTER — Other Ambulatory Visit: Payer: Self-pay

## 2021-05-05 VITALS — BP 181/76 | HR 64 | Temp 97.2°F | Resp 16 | Ht 65.0 in | Wt 135.0 lb

## 2021-05-05 DIAGNOSIS — M4125 Other idiopathic scoliosis, thoracolumbar region: Secondary | ICD-10-CM | POA: Diagnosis present

## 2021-05-05 DIAGNOSIS — G8929 Other chronic pain: Secondary | ICD-10-CM | POA: Insufficient documentation

## 2021-05-05 DIAGNOSIS — M545 Low back pain, unspecified: Secondary | ICD-10-CM | POA: Diagnosis present

## 2021-05-05 DIAGNOSIS — M48061 Spinal stenosis, lumbar region without neurogenic claudication: Secondary | ICD-10-CM | POA: Insufficient documentation

## 2021-05-05 DIAGNOSIS — M79605 Pain in left leg: Secondary | ICD-10-CM

## 2021-05-05 DIAGNOSIS — R52 Pain, unspecified: Secondary | ICD-10-CM | POA: Insufficient documentation

## 2021-05-05 DIAGNOSIS — M25551 Pain in right hip: Secondary | ICD-10-CM | POA: Diagnosis present

## 2021-05-05 DIAGNOSIS — M5136 Other intervertebral disc degeneration, lumbar region: Secondary | ICD-10-CM | POA: Insufficient documentation

## 2021-05-05 MED ORDER — SODIUM CHLORIDE (PF) 0.9 % IJ SOLN
INTRAMUSCULAR | Status: AC
  Filled 2021-05-05: qty 10

## 2021-05-05 MED ORDER — ROPIVACAINE HCL 2 MG/ML IJ SOLN
10.0000 mL | Freq: Once | INTRAMUSCULAR | Status: AC
  Administered 2021-05-05: 10 mL via EPIDURAL

## 2021-05-05 MED ORDER — LIDOCAINE HCL (PF) 1 % IJ SOLN
INTRAMUSCULAR | Status: AC
  Filled 2021-05-05: qty 10

## 2021-05-05 MED ORDER — IOHEXOL 180 MG/ML  SOLN
10.0000 mL | Freq: Once | INTRAMUSCULAR | Status: AC | PRN
  Administered 2021-05-05: 5 mL via EPIDURAL

## 2021-05-05 MED ORDER — ROPIVACAINE HCL 2 MG/ML IJ SOLN
INTRAMUSCULAR | Status: AC
  Filled 2021-05-05: qty 20

## 2021-05-05 MED ORDER — TRIAMCINOLONE ACETONIDE 40 MG/ML IJ SUSP
INTRAMUSCULAR | Status: AC
  Filled 2021-05-05: qty 1

## 2021-05-05 MED ORDER — SODIUM CHLORIDE 0.9% FLUSH
10.0000 mL | Freq: Once | INTRAVENOUS | Status: AC
  Administered 2021-05-05: 10 mL

## 2021-05-05 MED ORDER — TRIAMCINOLONE ACETONIDE 40 MG/ML IJ SUSP
40.0000 mg | Freq: Once | INTRAMUSCULAR | Status: AC
  Administered 2021-05-05: 40 mg

## 2021-05-05 MED ORDER — LIDOCAINE HCL (PF) 1 % IJ SOLN
5.0000 mL | Freq: Once | INTRAMUSCULAR | Status: AC
  Administered 2021-05-05: 5 mL via SUBCUTANEOUS
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
## 2021-05-05 NOTE — Patient Instructions (Signed)

## 2021-05-06 ENCOUNTER — Encounter: Payer: Self-pay | Admitting: Anesthesiology

## 2021-05-06 ENCOUNTER — Telehealth: Payer: Self-pay | Admitting: *Deleted

## 2021-05-06 NOTE — Telephone Encounter (Signed)
Attempted to call for post procedure follow-up. Message left. 

## 2021-05-14 ENCOUNTER — Ambulatory Visit (INDEPENDENT_AMBULATORY_CARE_PROVIDER_SITE_OTHER): Payer: Medicare Other | Admitting: Dermatology

## 2021-05-14 ENCOUNTER — Other Ambulatory Visit: Payer: Self-pay

## 2021-05-14 DIAGNOSIS — D1800 Hemangioma unspecified site: Secondary | ICD-10-CM

## 2021-05-14 DIAGNOSIS — L814 Other melanin hyperpigmentation: Secondary | ICD-10-CM

## 2021-05-14 DIAGNOSIS — L98491 Non-pressure chronic ulcer of skin of other sites limited to breakdown of skin: Secondary | ICD-10-CM

## 2021-05-14 DIAGNOSIS — L720 Epidermal cyst: Secondary | ICD-10-CM

## 2021-05-14 DIAGNOSIS — D692 Other nonthrombocytopenic purpura: Secondary | ICD-10-CM

## 2021-05-14 DIAGNOSIS — L821 Other seborrheic keratosis: Secondary | ICD-10-CM

## 2021-05-14 DIAGNOSIS — L570 Actinic keratosis: Secondary | ICD-10-CM | POA: Diagnosis not present

## 2021-05-14 DIAGNOSIS — Z1283 Encounter for screening for malignant neoplasm of skin: Secondary | ICD-10-CM

## 2021-05-14 DIAGNOSIS — D229 Melanocytic nevi, unspecified: Secondary | ICD-10-CM

## 2021-05-14 DIAGNOSIS — L820 Inflamed seborrheic keratosis: Secondary | ICD-10-CM | POA: Diagnosis not present

## 2021-05-14 DIAGNOSIS — L853 Xerosis cutis: Secondary | ICD-10-CM

## 2021-05-14 DIAGNOSIS — L578 Other skin changes due to chronic exposure to nonionizing radiation: Secondary | ICD-10-CM

## 2021-05-14 DIAGNOSIS — Z85828 Personal history of other malignant neoplasm of skin: Secondary | ICD-10-CM | POA: Diagnosis not present

## 2021-05-14 DIAGNOSIS — L84 Corns and callosities: Secondary | ICD-10-CM

## 2021-05-14 NOTE — Patient Instructions (Signed)

## 2021-05-14 NOTE — Progress Notes (Signed)
Follow-Up Visit   Subjective  Brenda Cardenas is a 87 y.o. female who presents for the following: Total body skin exam (Hx of BCC L upper back, hx of SCCs) and hx of scabs bleeding (R lower leg, L foot, pt was txted for L foot at hospital 1 week ago). The patient presents for Total-Body Skin Exam (TBSE) for skin cancer screening and mole check.  The following portions of the chart were reviewed this encounter and updated as appropriate:   Tobacco  Allergies  Meds  Problems  Med Hx  Surg Hx  Fam Hx     Review of Systems:  No other skin or systemic complaints except as noted in HPI or Assessment and Plan.  Objective  Well appearing patient in no apparent distress; mood and affect are within normal limits.  A full examination was performed including scalp, head, eyes, ears, nose, lips, neck, chest, axillae, abdomen, back, buttocks, bilateral upper extremities, bilateral lower extremities, hands, feet, fingers, toes, fingernails, and toenails. All findings within normal limits unless otherwise noted below.  Left Upper Back Well healed scar with no evidence of recurrence.   multiple Well healed scars with no evidence of recurrence, no lymphadenopathy.   L cheek x 1, R temple x 1, R pretibial x 1 (3) Pink scaly macules   R pretibial x 1, Total = 1 Erythematous keratotic or waxy stuck-on papule or plaque.   R pretibial, L lat dorsum foot Small crusts  bil feet callus   Assessment & Plan   Lentigines - Scattered tan macules - Due to sun exposure - Benign-appering, observe - Recommend daily broad spectrum sunscreen SPF 30+ to sun-exposed areas, reapply every 2 hours as needed. - Call for any changes  Seborrheic Keratoses - Stuck-on, waxy, tan-brown papules and/or plaques  - Benign-appearing - Discussed benign etiology and prognosis. - Observe - Call for any changes  Melanocytic Nevi - Tan-brown and/or pink-flesh-colored symmetric macules and papules - Benign  appearing on exam today - Observation - Call clinic for new or changing moles - Recommend daily use of broad spectrum spf 30+ sunscreen to sun-exposed areas.   Hemangiomas - Red papules - Discussed benign nature - Observe - Call for any changes  Actinic Damage - Chronic condition, secondary to cumulative UV/sun exposure - diffuse scaly erythematous macules with underlying dyspigmentation - Recommend daily broad spectrum sunscreen SPF 30+ to sun-exposed areas, reapply every 2 hours as needed.  - Staying in the shade or wearing long sleeves, sun glasses (UVA+UVB protection) and wide brim hats (4-inch brim around the entire circumference of the hat) are also recommended for sun protection.  - Call for new or changing lesions.  Skin cancer screening performed today.  Purpura - Chronic; persistent and recurrent.  Treatable, but not curable. - Violaceous macules and patches - Benign - Related to trauma, age, sun damage and/or use of blood thinners, chronic use of topical and/or oral steroids - Observe - Can use OTC arnica containing moisturizer such as Dermend Bruise Formula if desired - Call for worsening or other concerns  Xerosis - diffuse xerotic patches - recommend gentle, hydrating skin care - gentle skin care handout given  Milia - tiny firm white papules - type of cyst - benign - may be extracted if symptomatic - observe   History of basal cell carcinoma (BCC) Left Upper Back Clear. Observe for recurrence. Call clinic for new or changing lesions.  Recommend regular skin exams, daily broad-spectrum spf 30+ sunscreen use, and photoprotection.  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 Observe for recurrence. Call clinic for new or changing lesions.  Recommend regular skin exams, daily broad-spectrum spf 30+ sunscreen use, and photoprotection.    AK (actinic keratosis) (3) L cheek x 1, R temple x 1, R pretibial x 1 Destruction of lesion - L  cheek x 1, R temple x 1, R pretibial x 1 Complexity: simple   Destruction method: cryotherapy   Informed consent: discussed and consent obtained   Timeout:  patient name, date of birth, surgical site, and procedure verified Lesion destroyed using liquid nitrogen: Yes   Region frozen until ice ball extended beyond lesion: Yes   Outcome: patient tolerated procedure well with no complications   Post-procedure details: wound care instructions given    Inflamed seborrheic keratosis R pretibial x 1, Total = 1 Destruction of lesion - R pretibial x 1, Total = 1 Complexity: simple   Destruction method: cryotherapy   Informed consent: discussed and consent obtained   Timeout:  patient name, date of birth, surgical site, and procedure verified Lesion destroyed using liquid nitrogen: Yes   Region frozen until ice ball extended beyond lesion: Yes   Outcome: patient tolerated procedure well with no complications   Post-procedure details: wound care instructions given    Traumatic ulcer, limited to breakdown of skin (HCC) R pretibial, L lat dorsum foot From scratching area Cont woundcare qd until healed  Callus bil feet Benign, observe  Skin cancer screening  Return in about 1 year (around 05/14/2022) for TBSE, Hx of BCC, Hx of SCC.  I, Othelia Pulling, RMA, am acting as scribe for Sarina Ser, MD . Documentation: I have reviewed the above documentation for accuracy and completeness, and I agree with the above.  Sarina Ser, MD

## 2021-05-18 ENCOUNTER — Encounter: Payer: Self-pay | Admitting: Dermatology

## 2021-06-12 ENCOUNTER — Telehealth: Payer: Self-pay | Admitting: Internal Medicine

## 2021-06-12 DIAGNOSIS — I10 Essential (primary) hypertension: Secondary | ICD-10-CM

## 2021-06-12 DIAGNOSIS — E785 Hyperlipidemia, unspecified: Secondary | ICD-10-CM

## 2021-06-12 NOTE — Telephone Encounter (Signed)
Patient calling in and she would like labs done before her October appointment. She would like to discuss her results with the provider at her appointment.   No orders in Patient's chart, Please advise

## 2021-06-13 NOTE — Addendum Note (Signed)
Addended by: Crecencio Mc on: 06/13/2021 12:56 PM   Modules accepted: Orders

## 2021-07-04 MED ORDER — ONDANSETRON 4 MG PO TBDP: 4.0000 mg | ORAL_TABLET | Freq: Three times a day (TID) | ORAL | 0 refills | Status: DC | PRN

## 2021-07-10 ENCOUNTER — Other Ambulatory Visit (INDEPENDENT_AMBULATORY_CARE_PROVIDER_SITE_OTHER): Payer: Medicare Other

## 2021-07-10 ENCOUNTER — Other Ambulatory Visit: Payer: Self-pay

## 2021-07-10 DIAGNOSIS — I10 Essential (primary) hypertension: Secondary | ICD-10-CM | POA: Diagnosis not present

## 2021-07-10 DIAGNOSIS — E785 Hyperlipidemia, unspecified: Secondary | ICD-10-CM | POA: Diagnosis not present

## 2021-07-10 LAB — COMPREHENSIVE METABOLIC PANEL
ALT: 13 U/L (ref 0–35)
AST: 21 U/L (ref 0–37)
Albumin: 3.8 g/dL (ref 3.5–5.2)
Alkaline Phosphatase: 75 U/L (ref 39–117)
BUN: 15 mg/dL (ref 6–23)
CO2: 28 mEq/L (ref 19–32)
Calcium: 9.5 mg/dL (ref 8.4–10.5)
Chloride: 106 mEq/L (ref 96–112)
Creatinine, Ser: 0.71 mg/dL (ref 0.40–1.20)
GFR: 76.36 mL/min (ref >60.00)
Glucose, Bld: 100 mg/dL — ABNORMAL HIGH (ref 70–99)
Potassium: 4.2 mEq/L (ref 3.5–5.1)
Sodium: 141 mEq/L (ref 135–145)
Total Bilirubin: 0.7 mg/dL (ref 0.2–1.2)
Total Protein: 5.9 g/dL — ABNORMAL LOW (ref 6.0–8.3)

## 2021-07-10 LAB — LIPID PANEL
Cholesterol: 149 mg/dL (ref 0–200)
HDL: 46.1 mg/dL (ref >39.00)
LDL Cholesterol: 76 mg/dL (ref 0–99)
NonHDL: 103.36
Total CHOL/HDL Ratio: 3
Triglycerides: 136 mg/dL (ref 0.0–149.0)
VLDL: 27.2 mg/dL (ref 0.0–40.0)

## 2021-07-11 ENCOUNTER — Other Ambulatory Visit: Payer: Self-pay | Admitting: Internal Medicine

## 2021-07-18 ENCOUNTER — Other Ambulatory Visit: Payer: Self-pay

## 2021-07-18 ENCOUNTER — Ambulatory Visit (INDEPENDENT_AMBULATORY_CARE_PROVIDER_SITE_OTHER): Payer: Medicare Other | Admitting: Internal Medicine

## 2021-07-18 ENCOUNTER — Encounter: Payer: Self-pay | Admitting: Internal Medicine

## 2021-07-18 VITALS — BP 132/76 | HR 62 | Temp 95.9°F | Ht 65.0 in | Wt 135.0 lb

## 2021-07-18 DIAGNOSIS — M48061 Spinal stenosis, lumbar region without neurogenic claudication: Secondary | ICD-10-CM | POA: Diagnosis not present

## 2021-07-18 DIAGNOSIS — I10 Essential (primary) hypertension: Secondary | ICD-10-CM | POA: Diagnosis not present

## 2021-07-18 DIAGNOSIS — M810 Age-related osteoporosis without current pathological fracture: Secondary | ICD-10-CM | POA: Diagnosis not present

## 2021-07-18 DIAGNOSIS — I700 Atherosclerosis of aorta: Secondary | ICD-10-CM | POA: Diagnosis not present

## 2021-07-18 DIAGNOSIS — Z23 Encounter for immunization: Secondary | ICD-10-CM

## 2021-07-18 DIAGNOSIS — F411 Generalized anxiety disorder: Secondary | ICD-10-CM

## 2021-07-18 NOTE — Progress Notes (Signed)
Subjective:  Patient ID: Brenda Cardenas, female    DOB: 05-Mar-1932  Age: 87 y.o. MRN: [id number]  CC: The primary encounter diagnosis was Need for immunization against influenza. Diagnoses of Age related osteoporosis, unspecified pathological fracture presence, Essential hypertension, Aortic atherosclerosis ([place]), Spinal stenosis of lumbar region without neurogenic claudication, and GAD (generalized anxiety disorder) were also pertinent to this visit.  HPI Brenda Cardenas presents for  Chief Complaint  Patient presents with   Follow-up   This visit occurred during the SARS-CoV-2 public health emergency.  Safety protocols were in place, including screening questions prior to the visit, additional usage of staff PPE, and extensive cleaning of exam room while observing appropriate contact time as indicated for disinfecting solutions.   Fully recovered from GI illness that occurred after eating at the [facility]  at [facility].  2 episodes occurred,  6 days apart , each one occurred after eating  their grilled chicken . Each episode lasted less than 24 hours and resolved after one episode of emesis.   Used the [employer] to recover , did not have to use the zofran prn    History of broken ribs.  Recent episode of pain after vomiting, went to [facility] for a rib film :  no repeat fractures. Just a pulled muscle.   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  She has seen Dr. Marry Guan,  advised last year to get an upright walker with wheels but has not gotten one.  Using a folding lightweight walker with a seat,which enabled her to be more mobile .  Walking the track 3 to 5 times per week for an hour.   Can't take NSAIDs. Last ESI in Lumbar spine  (#2 ,, 1st was in January) In July did not provide lasting relief .  By Dr Andree Elk.   Outpatient Medications Prior to Visit   Medication Sig Dispense Refill   acetaminophen (TYLENOL) 325 MG tablet Take 650 mg by mouth 2 (two) times daily.      atenolol (TENORMIN) 25 MG tablet TAKE ONE TABLET EVERY DAY 90 tablet 1   calcium citrate-vitamin D (CITRACAL+D) 315-200 MG-UNIT per tablet Take 2 tablets by mouth daily.     chlorhexidine (PERIDEX) 0.12 % solution SMARTSIG:15 Capsule(s) By Mouth Twice Daily     Cholecalciferol (VITAMIN D3) 2000 units TABS Take 2,000 Units by mouth daily.      clobetasol ointment (TEMOVATE) 8.92 % Apply 1 application topically in the morning and at bedtime.     CRANBERRY PO Take 2 capsules by mouth daily.      estradiol (ESTRACE) 0.1 MG/GM vaginal cream estradiol 0.01% (0.1 mg/gram) vaginal cream     ezetimibe (ZETIA) 10 MG tablet TAKE 1 TABLET BY MOUTH DAILY 90 tablet 3   hyoscyamine (ANASPAZ) 0.125 MG TBDP disintergrating tablet Place 1 tablet (0.125 mg total) under the tongue every 6 (six) hours as needed (as needed for esophageal spasm). 30 tablet 0   Multiple Vitamins-Minerals (CENTRUM SILVER 50+WOMEN PO) 1 tablet by mouth every day     mupirocin ointment (BACTROBAN) 2 %      omeprazole (PRILOSEC) 20 MG capsule TAKE 1 CAPSULE BY MOUTH EVERY DAY 90 capsule 1   ondansetron (ZOFRAN ODT) 4 MG disintegrating tablet Take 1 tablet (4 mg total) by mouth every 8 (eight) hours as needed for nausea or vomiting.  20 tablet 0   Probiotic Product (ALIGN PO) Take 1 tablet by mouth daily.      Facility-Administered Medications Prior to Visit  Medication Dose Route Frequency Provider Last Rate Last Admin   denosumab (PROLIA) injection 60 mg  60 mg Subcutaneous Once Einar Pheasant, MD        Review of Systems;  Patient denies headache, fevers, malaise, unintentional weight loss, skin rash, eye pain, sinus congestion and sinus pain, sore throat, dysphagia,  hemoptysis , cough, dyspnea, wheezing, chest pain, palpitations, orthopnea, edema, abdominal pain, nausea, melena, diarrhea, constipation, flank pain,  dysuria, hematuria, urinary  Frequency, nocturia, numbness, tingling, seizures,  Focal weakness, Loss of consciousness,  Tremor, insomnia, depression, anxiety, and suicidal ideation.      Objective:  BP 132/76   Pulse 62   Temp (!) 95.9 F (35.5 C)   Ht 5\' 5"  (1.651 m)   Wt 135 lb (61.2 kg)   SpO2 96%   BMI 22.47 kg/m   BP Readings from Last 3 Encounters:  07/18/21 132/76  05/05/21 (!) 181/76  05/03/21 (!) 151/74    Wt Readings from Last 3 Encounters:  07/18/21 135 lb (61.2 kg)  05/05/21 135 lb (61.2 kg)  05/03/21 135 lb (61.2 kg)    General appearance: alert, cooperative and appears stated age Ears: normal TM's and external ear canals both ears Throat: lips, mucosa, and tongue normal; teeth and gums normal Neck: no adenopathy, no carotid bruit, supple, symmetrical, trachea midline and thyroid not enlarged, symmetric, no tenderness/mass/nodules Back: symmetric, no curvature. ROM normal. No CVA tenderness. Lungs: clear to auscultation bilaterally Heart: regular rate and rhythm, S1, S2 normal, no murmur, click, rub or gallop Abdomen: soft, non-tender; bowel sounds normal; no masses,  no organomegaly Pulses: 2+ and symmetric Skin: Skin color, texture, turgor normal. No rashes or lesions Lymph nodes: Cervical, supraclavicular, and axillary nodes normal.  Lab Results  Component Value Date   HGBA1C 5.4 01/05/2020   HGBA1C 5.5 06/16/2018   HGBA1C 5.7 03/24/2018    Lab Results  Component Value Date   CREATININE 0.71 07/10/2021   CREATININE 0.86 01/09/2021   CREATININE 0.77 07/08/2020    Lab Results  Component Value Date   WBC 9.7 01/09/2021   HGB 12.2 01/09/2021   HCT 35.9 (L) 01/09/2021   PLT 170.0 01/09/2021   GLUCOSE 100 (H) 07/10/2021   CHOL 149 07/10/2021   TRIG 136.0 07/10/2021   HDL 46.10 07/10/2021   LDLDIRECT 103.0 06/16/2017   LDLCALC 76 07/10/2021   ALT 13 07/10/2021   AST 21 07/10/2021   NA 141 07/10/2021   K 4.2 07/10/2021   CL 106 07/10/2021    CREATININE 0.71 07/10/2021   BUN 15 07/10/2021   CO2 28 07/10/2021   TSH 0.79 01/09/2021   HGBA1C 5.4 01/05/2020   MICROALBUR 1.0 01/05/2020    DG PAIN CLINIC C-ARM 1-60 MIN NO REPORT  Result Date: 05/05/2021 Fluoro was used, but no Radiologist interpretation will be provided. Please refer to "NOTES" tab for provider progress note.   Assessment & Plan:   Problem List Items Addressed This Visit     Osteoporosis    Managed with Prolia injections every 6 months / T scores stable/unchanged by 2021.  continue prolia;  Current injection due Jan 2023       Spinal stenosis of lumbar region    She had  no relief with last  ESI for relief of pain .  Pain is tolerable but she has flares of sciatica.  Will use prednisone taper for next episode       Essential hypertension    Well controlled on current regimen of low dose atenolol. . Renal function stable, no changes today.      Aortic atherosclerosis ([place])    Reviewed prior  Chest CT  with patient. Continue Zetia.  She is statin intolerant due to development of rash and elevated liver enzymes   Lab Results  Component Value Date   CHOL 149 07/10/2021   HDL 46.10 07/10/2021   LDLCALC 76 07/10/2021   LDLDIRECT 103.0 06/16/2017   TRIG 136.0 07/10/2021   CHOLHDL 3 07/10/2021         GAD (generalized anxiety disorder)    Chronic, lifelong.  Defers treatment       Other Visit Diagnoses     Need for immunization against influenza    -  Primary   Relevant Orders   Flu Vaccine QUAD High Dose(Fluad) (Completed)       I am having [name] maintain her calcium citrate-vitamin D, acetaminophen, Probiotic Product (ALIGN PO), Vitamin D3, Multiple Vitamins-Minerals (CENTRUM SILVER 50+WOMEN PO), CRANBERRY PO, mupirocin ointment, hyoscyamine, clobetasol ointment, estradiol, chlorhexidine, omeprazole, atenolol, ondansetron, and ezetimibe. We will continue to administer denosumab.  No orders of the defined types were placed in this  encounter.   There are no discontinued medications.  Follow-up: No follow-ups on file.   Crecencio Mc, MD

## 2021-07-18 NOTE — Patient Instructions (Addendum)
The next time you have pain radiating down your legs, call me and I will prescribe a prednisone taper.  We can do   this once every 2 months     You can continue using 2000 mg of acetominophen (tylenol) every day safely  In divided doses (500 mg every 6 hours  Or 1000 mg every 12 hours.)

## 2021-07-20 NOTE — Assessment & Plan Note (Signed)
Well controlled on current regimen of low dose atenolol. . Renal function stable, no changes today.

## 2021-07-20 NOTE — Assessment & Plan Note (Signed)
She had  no relief with last  ESI for relief of pain .  Pain is tolerable but she has flares of sciatica.  Will use prednisone taper for next episode

## 2021-07-20 NOTE — Assessment & Plan Note (Signed)
Reviewed prior  Chest CT  with patient. Continue Zetia.  She is statin intolerant due to development of rash and elevated liver enzymes   Lab Results  Component Value Date   CHOL 149 07/10/2021   HDL 46.10 07/10/2021   LDLCALC 76 07/10/2021   LDLDIRECT 103.0 06/16/2017   TRIG 136.0 07/10/2021   CHOLHDL 3 07/10/2021

## 2021-07-20 NOTE — Assessment & Plan Note (Signed)
Managed with Prolia injections every 6 months / T scores stable/unchanged by 2021.  continue prolia;  Current injection due Jan 2023

## 2021-07-20 NOTE — Assessment & Plan Note (Signed)
Chronic, lifelong.  Defers treatment

## 2021-08-07 ENCOUNTER — Telehealth: Payer: Self-pay | Admitting: Internal Medicine

## 2021-08-07 MED ORDER — GABAPENTIN 100 MG PO CAPS: 100.0000 mg | ORAL_CAPSULE | Freq: Three times a day (TID) | ORAL | 3 refills | Status: DC

## 2021-08-07 NOTE — Telephone Encounter (Signed)
Pt is calling to let Dr. Derrel Nip know that the medication she was prescribed for her leg pain is not helping and would like to try a different medication.

## 2021-08-07 NOTE — Telephone Encounter (Signed)
Patient saw Dr Derrel Nip on 07/18/2021. Patient is still having pain going down her leg. Dr Derrel Nip wanted her to call her back to let her know so she could try another medication.

## 2021-08-07 NOTE — Addendum Note (Signed)
Addended by: Crecencio Mc on: 08/07/2021 05:53 PM   Modules accepted: Orders

## 2021-08-08 MED ORDER — PREDNISONE 10 MG PO TABS: ORAL_TABLET | ORAL | 0 refills | Status: DC

## 2021-08-08 NOTE — Telephone Encounter (Signed)
LMTCB

## 2021-08-08 NOTE — Telephone Encounter (Signed)
Patient called and message read, patient said ok.

## 2021-08-08 NOTE — Telephone Encounter (Signed)
LMTCB. Okay to let pt know that the prednisone taper has been sent to [facility].

## 2021-08-08 NOTE — Telephone Encounter (Signed)
Pt returning call

## 2021-08-08 NOTE — Telephone Encounter (Signed)
noted 

## 2021-08-08 NOTE — Telephone Encounter (Signed)
Spoke with pt and she stated that she would like to try the prednisone taper that was discussed during her last office visit.

## 2021-08-08 NOTE — Telephone Encounter (Signed)
The prednisone taper [facility]

## 2021-08-08 NOTE — Addendum Note (Signed)
Addended by: Crecencio Mc on: 08/08/2021 01:12 PM   Modules accepted: Orders

## 2021-08-09 ENCOUNTER — Emergency Department
Admission: EM | Admit: 2021-08-09 | Discharge: 2021-08-09 | Disposition: A | Discharge status: Home / Self Care | Payer: Medicare Other | Attending: Emergency Medicine | Admitting: Emergency Medicine

## 2021-08-09 DIAGNOSIS — I4891 Unspecified atrial fibrillation: Secondary | ICD-10-CM | POA: Diagnosis not present

## 2021-08-09 DIAGNOSIS — Z79899 Other long term (current) drug therapy: Secondary | ICD-10-CM | POA: Diagnosis not present

## 2021-08-09 DIAGNOSIS — Z85828 Personal history of other malignant neoplasm of skin: Secondary | ICD-10-CM | POA: Diagnosis not present

## 2021-08-09 DIAGNOSIS — I129 Hypertensive chronic kidney disease with stage 1 through stage 4 chronic kidney disease, or unspecified chronic kidney disease: Secondary | ICD-10-CM | POA: Diagnosis not present

## 2021-08-09 DIAGNOSIS — Z96651 Presence of right artificial knee joint: Secondary | ICD-10-CM | POA: Diagnosis not present

## 2021-08-09 DIAGNOSIS — R000 Tachycardia, unspecified: Secondary | ICD-10-CM | POA: Diagnosis present

## 2021-08-09 DIAGNOSIS — N189 Chronic kidney disease, unspecified: Secondary | ICD-10-CM | POA: Diagnosis not present

## 2021-08-09 LAB — BASIC METABOLIC PANEL
Anion gap: 8 (ref 5–15)
BUN: 28 mg/dL — ABNORMAL HIGH (ref 8–23)
CO2: 22 mmol/L (ref 22–32)
Calcium: 9.5 mg/dL (ref 8.9–10.3)
Chloride: 105 mmol/L (ref 98–111)
Creatinine, Ser: 0.95 mg/dL (ref 0.44–1.00)
GFR, Estimated: 58 mL/min — ABNORMAL LOW (ref >60)
Glucose, Bld: 197 mg/dL — ABNORMAL HIGH (ref 70–99)
Potassium: 4.3 mmol/L (ref 3.5–5.1)
Sodium: 135 mmol/L (ref 135–145)

## 2021-08-09 LAB — CBC
HCT: 35.7 % — ABNORMAL LOW (ref 36.0–46.0)
Hemoglobin: 12.1 g/dL (ref 12.0–15.0)
MCH: 32.9 pg (ref 26.0–34.0)
MCHC: 33.9 g/dL (ref 30.0–36.0)
MCV: 97 fL (ref 80.0–100.0)
Platelets: 192 10*3/uL (ref 150–400)
RBC: 3.68 MIL/uL — ABNORMAL LOW (ref 3.87–5.11)
RDW: 18.9 % — ABNORMAL HIGH (ref 11.5–15.5)
WBC: 7.7 10*3/uL (ref 4.0–10.5)
nRBC: 0.5 % — ABNORMAL HIGH (ref 0.0–0.2)

## 2021-08-09 LAB — MAGNESIUM: Magnesium: 2.1 mg/dL (ref 1.7–2.4)

## 2021-08-09 LAB — TROPONIN I (HIGH SENSITIVITY)
Troponin I (High Sensitivity): 33 ng/L — ABNORMAL HIGH (ref <18)
Troponin I (High Sensitivity): 95 ng/L — ABNORMAL HIGH (ref <18)

## 2021-08-09 MED ORDER — MAGNESIUM SULFATE 2 GM/50ML IV SOLN
2.0000 g | Freq: Once | INTRAVENOUS | Status: AC
  Administered 2021-08-09: 2 g via INTRAVENOUS
  Filled 2021-08-09: qty 50

## 2021-08-09 MED ORDER — SODIUM CHLORIDE 0.9 % IV BOLUS
1000.0000 mL | Freq: Once | INTRAVENOUS | Status: AC
  Administered 2021-08-09: 1000 mL via INTRAVENOUS

## 2021-08-09 MED ORDER — METOPROLOL TARTRATE 5 MG/5ML IV SOLN
5.0000 mg | Freq: Once | INTRAVENOUS | Status: AC
  Administered 2021-08-09: 5 mg via INTRAVENOUS
  Filled 2021-08-09: qty 5

## 2021-08-09 MED ORDER — METOPROLOL TARTRATE 25 MG PO TABS
25.0000 mg | ORAL_TABLET | Freq: Once | ORAL | Status: AC
  Administered 2021-08-09: 25 mg via ORAL
  Filled 2021-08-09: qty 1

## 2021-08-09 NOTE — ED Triage Notes (Signed)
Pt in with palpitations that began at 3pm this afternoon, denies any cp or sob, just feels "jittery". HR 140-150's in triage, states hx of this for 40+ yrs, often happens when she is on steroids. VSS, a&0x4

## 2021-08-09 NOTE — ED Provider Notes (Signed)
[facility] Emergency Department Provider Note  ____________________________________________  Time seen: Approximately 8:39 PM  I have reviewed the triage vital signs and the nursing notes.   HISTORY  Chief Complaint Tachycardia    HPI Brenda Cardenas is a 87 y.o. female with a history of CKD hypertension IBS and GERD who comes the ED complaining of racing heart rate that started at 3:00 this afternoon.  No chest pain or shortness of breath, but she does feel fatigued and somewhat dizzy with the symptoms.  It has been constant during this time, no aggravating or alleviating factors.  No syncope.  She has been in her usual state of health recently, with the only change being that she started gabapentin and prednisone for her chronic back pain and just took a 60 mg prednisone dose.  Denies any excessive caffeine or stimulant use, no significant dehydration or sleep deprivation or over-the-counter medication use.  Denies a history of atrial fibrillation.  Not on blood thinners.    Past Medical History:  Diagnosis Date  . Arthritis   . Basal cell carcinoma 06/04/2010   left upper back  . Chronic kidney disease   . Colon polyps   . Gastritis   . GERD (gastroesophageal reflux disease)   . Hiatal hernia   . Hyperlipidemia   . Hypertension   . IBS (irritable bowel syndrome)   . Low back pain radiating to left leg 03/28/2020  . Sciatica, left side 04/15/2020  . Squamous cell carcinoma of skin 06/26/2013   left mid pretibial  . Squamous cell carcinoma of skin 08/21/2015   lower sternum  . Squamous cell carcinoma of skin 10/14/2017   right mid pretibial  . Squamous cell carcinoma of skin 03/17/2018   right prox lateral elbow  . Squamous cell carcinoma of skin 09/07/2018   right post thigh  . Squamous cell carcinoma of skin 05/11/2019   right mid med pretibial  . Squamous cell carcinoma of skin 06/20/2019   right bicep     Patient Active Problem List    Diagnosis Date Noted  . Urinary frequency 11/05/2020  . GAD (generalized anxiety disorder) 11/05/2020  . Left ventricular diastolic dysfunction 05/39/7673  . 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  . Multiple closed fractures of ribs of right side 07/08/2020  . Aortic atherosclerosis ([place]) 07/08/2020  . Primary osteoarthritis of left knee 04/21/2020  . Sciatica, left side 04/15/2020  . Low back pain radiating to left leg 03/28/2020  . BPPV (benign paroxysmal positional vertigo) 03/14/2019  . Educated about COVID-19 virus infection 03/14/2019  . Hyperlipidemia LDL goal <130 06/20/2018  . Varicose veins of bilateral lower extremities with other complications 41/93/7902  . Incomplete emptying of bladder 02/17/2017  . Constipation 07/14/2016  . Hypovitaminosis D 06/23/2016  . Venous stasis of both lower extremities 06/23/2016  . Essential hypertension 12/24/2015  . History of colonic polyps 12/24/2015  . Nocturnal leg cramps 12/24/2015  . Spinal stenosis of lumbar region 08/13/2015  . Idiopathic scoliosis 06/24/2015  . Rhinitis due food 12/17/2014  . Osteoarthritis of cervical spine 02/25/2014  . Osteoporosis 06/18/2013  . Encounter for Medicare annual wellness exam 06/18/2013  . Hiatal hernia 06/16/2013  . Diaphragmatic hernia 06/16/2013  . GERD (gastroesophageal reflux disease) 05/02/2013  . S/P total knee replacement 05/02/2013  . Artificial knee joint present 05/02/2013  . History of urinary stone 11/03/2012     Past Surgical History:  Procedure Laterality Date  . APPENDECTOMY  1959  . CARPAL TUNNEL  RELEASE    . CATARACT EXTRACTION W/ INTRAOCULAR LENS IMPLANT Left   . CATARACT EXTRACTION W/PHACO Right 04/20/2018   Procedure: CATARACT EXTRACTION PHACO AND INTRAOCULAR LENS PLACEMENT ([place])  RIGHT;  Surgeon: Leandrew Koyanagi, MD;  Location: [place];  Service: Ophthalmology;  Laterality: Right;  . CHOLECYSTECTOMY  1995  . GANGLION CYST EXCISION    .  JOINT REPLACEMENT Right July 2009   [place]   . KNEE ARTHROSCOPY    . LITHOTRIPSY    . SALIVARY GLAND SURGERY    . SKIN CANCER EXCISION    . TONSILLECTOMY    . TONSILLECTOMY AND ADENOIDECTOMY    . VEIN LIGATION       Prior to Admission medications   Medication Sig Start Date End Date Taking? Authorizing Provider  acetaminophen (TYLENOL) 325 MG tablet Take 650 mg by mouth 2 (two) times daily.     [provider]  atenolol (TENORMIN) 25 MG tablet TAKE ONE TABLET EVERY DAY 03/06/21   Crecencio Mc, MD  calcium citrate-vitamin D (CITRACAL+D) 315-200 MG-UNIT per tablet Take 2 tablets by mouth daily.    [provider]  chlorhexidine (PERIDEX) 0.12 % solution SMARTSIG:15 Capsule(s) By Mouth Twice Daily 01/13/21   [provider]  Cholecalciferol (VITAMIN D3) 2000 units TABS Take 2,000 Units by mouth daily.     [provider]  clobetasol ointment (TEMOVATE) 1.95 % Apply 1 application topically in the morning and at bedtime. 09/24/20   [provider]  CRANBERRY PO Take 2 capsules by mouth daily.     [provider]  estradiol (ESTRACE) 0.1 MG/GM vaginal cream estradiol 0.01% (0.1 mg/gram) vaginal cream    [provider]  ezetimibe (ZETIA) 10 MG tablet TAKE 1 TABLET BY MOUTH DAILY 07/11/21   Crecencio Mc, MD  gabapentin (NEURONTIN) 100 MG capsule Take 1 capsule (100 mg total) by mouth 3 (three) times daily. 08/07/21   Crecencio Mc, MD  hyoscyamine (ANASPAZ) 0.125 MG TBDP disintergrating tablet Place 1 tablet (0.125 mg total) under the tongue every 6 (six) hours as needed (as needed for esophageal spasm). 01/11/20   Crecencio Mc, MD  Multiple Vitamins-Minerals (CENTRUM SILVER 50+WOMEN PO) 1 tablet by mouth every day    [provider]  mupirocin ointment (BACTROBAN) 2 %  03/17/18   [provider]  omeprazole (PRILOSEC) 20 MG capsule TAKE 1 CAPSULE BY MOUTH EVERY DAY 02/20/21   Crecencio Mc, MD  ondansetron  (ZOFRAN ODT) 4 MG disintegrating tablet Take 1 tablet (4 mg total) by mouth every 8 (eight) hours as needed for nausea or vomiting. 07/04/21   Crecencio Mc, MD  Probiotic Product (ALIGN PO) Take 1 tablet by mouth daily.     [provider]     Allergies Ciprofloxacin, Clindamycin/lincomycin, Sulfa antibiotics, Augmentin [amoxicillin-pot clavulanate], Naproxen sodium, Tramadol, Codeine, Doxycycline hyclate, E-mycin [erythromycin], Macrobid [nitrofurantoin], Nabumetone, Nitrofurantoin monohyd macro, Nsaids, Phenobarbital, and Statins   Family History  Problem Relation Age of Onset  . Heart disease Mother   . Arthritis Mother   . Diabetes Father     Social History Social History   Tobacco Use  . Smoking status: Never  . Smokeless tobacco: Never  Vaping Use  . Vaping Use: Never used  Substance Use Topics  . Alcohol use: No    Alcohol/week: 0.0 standard drinks  . Drug use: No    Review of Systems  Constitutional:   No fever or chills.  ENT:   No  sore throat. No rhinorrhea. Cardiovascular:   No chest pain or syncope.  Positive palpitations Respiratory:   No dyspnea or cough. Gastrointestinal:   Negative for abdominal pain, vomiting and diarrhea.  Musculoskeletal:   Negative for focal pain or swelling All other systems reviewed and are negative except as documented above in ROS and HPI.  ____________________________________________   PHYSICAL EXAM:  VITAL SIGNS: ED Triage Vitals  Enc Vitals Group     BP 08/09/21 1907 (!) 135/95     Pulse Rate 08/09/21 1907 (!) 145     Resp 08/09/21 1907 18     Temp 08/09/21 1907 97.7 F (36.5 C)     Temp Source 08/09/21 1907 Oral     SpO2 08/09/21 1907 99 %     Weight 08/09/21 1914 134 lb 14.7 oz (61.2 kg)     Height --      Head Circumference --      Peak Flow --      Pain Score --      Pain Loc --      Pain Edu? --      Excl. in [place]? --     Vital signs reviewed, nursing assessments reviewed.   Constitutional:    Alert and oriented. Non-toxic appearance. Eyes:   Conjunctivae are normal. EOMI. PERRL. ENT      Head:   Normocephalic and atraumatic.      Nose:   Normal.      Mouth/Throat: Normal, moist mucosa      Neck:   No meningismus. Full ROM. Hematological/Lymphatic/Immunilogical:   No cervical lymphadenopathy. Cardiovascular:   Tachycardia, heart rate 140,. Symmetric bilateral radial and DP pulses.  No murmurs. Cap refill less than 2 seconds. Respiratory:   Normal respiratory effort without tachypnea/retractions. Breath sounds are clear and equal bilaterally. No wheezes/rales/rhonchi. Gastrointestinal:   Soft and nontender. Non distended. There is no CVA tenderness.  No rebound, rigidity, or guarding. Genitourinary:   deferred Musculoskeletal:   Normal range of motion in all extremities. No joint effusions.  No lower extremity tenderness.  No edema. Neurologic:   Normal speech and language.  Motor grossly intact. No acute focal neurologic deficits are appreciated.  Skin:    Skin is warm, dry and intact. No rash noted.  No petechiae, purpura, or bullae.  ____________________________________________    LABS (pertinent positives/negatives) (all labs ordered are listed, but only abnormal results are displayed) Labs Reviewed  CBC - Abnormal; Notable for the following components:      Result Value   RBC 3.68 (*)    HCT 35.7 (*)    RDW 18.9 (*)    nRBC 0.5 (*)    All other components within normal limits  BASIC METABOLIC PANEL  MAGNESIUM  TROPONIN I (HIGH SENSITIVITY)   ____________________________________________   EKG  Interpreted by me Atrial fibrillation, tachycardia rate of 146.  Normal axis, otherwise normal intervals.  Normal QRS, some diffuse depressions, possibly rate related demand ischemia.  Cannot rule out atrial flutter  Repeat EKG shows atrial fibrillation, rate of 142, no evolving ischemic changes  Repeat EKG at 8:33 PM after receiving IV metoprolol shows sinus rhythm,  rate of 76.  Left axis, first-degree AV block.  Normal ST segments and T waves.  No ischemic changes.  ____________________________________________    RADIOLOGY  No results found.  ____________________________________________   PROCEDURES Procedures  ____________________________________________  DIFFERENTIAL DIAGNOSIS   Paroxysmal atrial fibrillation, dehydration, electrolyte abnormality, anemia  CLINICAL IMPRESSION / ASSESSMENT AND PLAN / ED  COURSE  Medications ordered in the ED: Medications  magnesium sulfate IVPB 2 g 50 mL (2 g Intravenous New Bag/Given 08/09/21 2001)  metoprolol tartrate (LOPRESSOR) tablet 25 mg (has no administration in time range)  metoprolol tartrate (LOPRESSOR) injection 5 mg (5 mg Intravenous Given 08/09/21 1955)  sodium chloride 0.9 % bolus 1,000 mL (1,000 mLs Intravenous New Bag/Given 08/09/21 2000)    Pertinent labs & imaging results that were available during my care of the patient were reviewed by me and considered in my medical decision making (see chart for details).  Brenda Cardenas was evaluated in Emergency Department on 08/09/2021 for the symptoms described in the history of present illness. She was evaluated in the context of the global COVID-19 pandemic, which necessitated consideration that the patient might be at risk for infection with the SARS-CoV-2 virus that causes COVID-19. Institutional protocols and algorithms that pertain to the evaluation of patients at risk for COVID-19 are in a state of rapid change based on information released by regulatory bodies including the CDC and federal and state organizations. These policies and algorithms were followed during the patient's care in the ED.   Patient presents with palpitations, found to be in A. fib with a heart rate in the 140s.  Blood pressure stable, no chest pain or shortness of breath.  Patient given IV fluids, IV metoprolol, IV magnesium with spontaneous conversion to sinus rhythm  at a normal rate.  Blood pressure remains normal, she is feeling better.  Will start oral metoprolol and have her follow-up with cardiology, provided chemistry results are unremarkable  Clinical Course as of 08/09/21 2219  Sat Aug 09, 2021  2214 Patient remains in normal sinus rhythm.  Asymptomatic.  Troponin slightly uptrending, which I think is related to demand ischemia and not active ACS, PE, dissection, or carditis.  I spoke with PCP on call Dr. Danise Mina who agrees with plan for atenolol 25 bid until f/u.  [PS]    Clinical Course User Index [PS] Carrie Mew, MD     ____________________________________________   FINAL CLINICAL IMPRESSION(S) / ED DIAGNOSES    Final diagnoses:  Atrial fibrillation with [facility])     ED Discharge Orders     None       Portions of this note were generated with dragon dictation software. Dictation errors may occur despite best attempts at proofreading.    Carrie Mew, MD 08/09/21 2044

## 2021-08-09 NOTE — Discharge Instructions (Addendum)
Your symptoms today were due to an abnormal heart rhythm called Atrial Fibrillation.  This may have been provoked by the prednisone, so you should stop taking that medication.  You can continue taking gabapentin.  Increase your atenolol to 25mg  twice a day to prevent recurrence of this fast heart rhythm.  Please follow up with Dr. Saunders Revel this week for further evaluation.

## 2021-08-11 ENCOUNTER — Telehealth: Payer: Self-pay | Admitting: Internal Medicine

## 2021-08-11 NOTE — Telephone Encounter (Signed)
  Pt said she was in the ED this weekend because she was in Afib, she said she was told to be seen in the next 2 days but Dr. Saunders Revel. Provide first available but pt said she needs to be seen today or tomorrow for hospital f/u

## 2021-08-11 NOTE — Telephone Encounter (Signed)
I reviewed the patient's chart. She was seen in the ER on 08/09/21 for rapid heart rate- interpreted to be atrial fibrillation with a HR of 146 bpm.  The patient received IV metoprolol  with restoration to NSR.  Reviewed available add on appointments with our office manager, Shanda Howells- ok to schedule the patient for an add on appointment on 08/13/21 with Dr. Saunders Revel at 3:20 pm.  I called and spoke with the patient. She advised that she was recently prescribed a prednisone taper by Dr. Derrel Nip for her chronic back pain due to spinal stenosis. She took the 1st dose- 60 mg- of prednisone on Saturday morning. She advised by 3 pm she just wasn't feeling well. She checked her HR and it was 151 bpm.  She waited until 6 pm to see if her symptoms would resolve, but they didn't stop. She called EMS and they advised her her rates were still high and she needed to go to the ER. She had neighbor drive her and by 8:08 pm she was being evaluated.  She advised medications were given and she started to feel better. She advised Dr. Joni Fears who evaluated her in the ER reached out to cardiology, although I do not see documentation of this, and also reached out to her PCP, which I do see Dr. Joni Fears spoke with Dr. Leo Grosser and the decision was made to increase the patient's home does of atenolol to 25 mg BID until seen in follow up.  The patient advised she is feeling fine today.  She is scheduled to see Dr. Derrel Nip tomorrow. I have offered her cardiology follow up with Dr. Saunders Revel on Wednesday 11/16 at 3:20 pm. The patient voices understanding and is agreeable with this appointment.  The patient confirms she has stopped prednisone. I have advised her to please follow up with Dr. Derrel Nip as planned for tomorrow. The patient is agreeable and very appreciative for the call back today.

## 2021-08-11 NOTE — Telephone Encounter (Signed)
Patient went to ED over the weekend. Patient went into A-fib and the hospital thinks it is from taking the medication that Dr Derrel Nip prescribe for her back. She tried calling her cardiologist and he is out of the office. She is waiting from the cardiologist office to call her back. She would like to see only Dr Derrel Nip. No appointments available.

## 2021-08-11 NOTE — Telephone Encounter (Signed)
Spoke with Brenda Cardenas and scheduled her for an office visit tomorrow at 12:30 per Dr. Derrel Nip.

## 2021-08-12 ENCOUNTER — Encounter: Payer: Self-pay | Admitting: Internal Medicine

## 2021-08-12 ENCOUNTER — Ambulatory Visit (INDEPENDENT_AMBULATORY_CARE_PROVIDER_SITE_OTHER): Payer: Medicare Other | Admitting: Internal Medicine

## 2021-08-12 ENCOUNTER — Other Ambulatory Visit: Payer: Self-pay

## 2021-08-12 VITALS — BP 138/60 | HR 92 | Temp 96.8°F | Ht 65.0 in | Wt 135.0 lb

## 2021-08-12 DIAGNOSIS — I480 Paroxysmal atrial fibrillation: Secondary | ICD-10-CM

## 2021-08-12 DIAGNOSIS — I4891 Unspecified atrial fibrillation: Secondary | ICD-10-CM | POA: Diagnosis not present

## 2021-08-12 NOTE — Progress Notes (Signed)
Subjective:  Patient ID: Brenda Cardenas, female    DOB: 1932-04-17  Age: 87 y.o. MRN: [id number]  CC: The primary encounter diagnosis was Atrial fibrillation, unspecified type ([place]). A diagnosis of PAF (paroxysmal atrial fibrillation) ([facility]) was also pertinent to this visit.  HPI Brenda Cardenas presents for   ER follow up Chief Complaint  Patient presents with   Follow-up    ED follow up    87  yr old female with no significant cardiac history presents after ER evaluation on Nov 12  for new onset rapid atrial fibrillation that presented with symptoms of racing heart that began at 3 am.  She was noted to be in rapid a fib rate 143,   treated with IV fluids and IV metoprolol and spontaneously converted.  Troponin  ( (high sensitivity ) was elevated x 2  (first one 3,  then 94) attributed to demand ischemia. Was discharged home with atenolol 25 mg bid .  She has not had any episodes in the last 3 days , has been monitoring her BP and heart rate several times daily and all readigns have been < 130/80 and pulse in the mid 50's to mid 60's.  Has appt with cardiology tomorrow (Dr.  Saunders Revel)   Has stopped the prednisone which she started on Saturday as well as a starting dose of gabapentin. Started feeling odd while at the hair salon.  Once home noted a rapid heart rate that did not respond to carotid rub.  After 3 hours of rapid heart rate, had EMT  evaluated her and pulse was 150 bpm,  so she went to ER .   The ER MD told her the prednisone may have caused the a fib) so she has stopped taking it.   Constipation new since last week.  Having some fecal leakage with flatulence,  no solid stools since ER visit     Outpatient Medications Prior to Visit  Medication Sig Dispense Refill   acetaminophen (TYLENOL) 325 MG tablet Take 650 mg by mouth 2 (two) times daily.      atenolol (TENORMIN) 25 MG tablet Take 1 tablet (25 mg) by mouth twice daily     calcium citrate-vitamin D (CITRACAL+D) 315-200 MG-UNIT  per tablet Take 2 tablets by mouth daily.     Cholecalciferol (VITAMIN D3) 2000 units TABS Take 2,000 Units by mouth daily.      CRANBERRY PO Take 2 capsules by mouth daily.      estradiol (ESTRACE) 0.1 MG/GM vaginal cream estradiol 0.01% (0.1 mg/gram) vaginal cream     ezetimibe (ZETIA) 10 MG tablet TAKE 1 TABLET BY MOUTH DAILY 90 tablet 3   hyoscyamine (ANASPAZ) 0.125 MG TBDP disintergrating tablet Place 1 tablet (0.125 mg total) under the tongue every 6 (six) hours as needed (as needed for esophageal spasm). 30 tablet 0   Multiple Vitamins-Minerals (CENTRUM SILVER 50+WOMEN PO) 1 tablet by mouth every day     omeprazole (PRILOSEC) 20 MG capsule TAKE 1 CAPSULE BY MOUTH EVERY DAY 90 capsule 1   ondansetron (ZOFRAN ODT) 4 MG disintegrating tablet Take 1 tablet (4 mg total) by mouth every 8 (eight) hours as needed for nausea or vomiting. 20 tablet 0   Probiotic Product (ALIGN PO) Take 1 tablet by mouth daily.      mupirocin ointment (BACTROBAN) 2 %      clobetasol ointment (TEMOVATE) 1.57 % Apply 1 application topically in the morning and at bedtime. (Patient not taking: Reported on 08/12/2021)  chlorhexidine (PERIDEX) 0.12 % solution SMARTSIG:15 Capsule(s) By Mouth Twice Daily (Patient not taking: Reported on 08/12/2021)     gabapentin (NEURONTIN) 100 MG capsule Take 1 capsule (100 mg total) by mouth 3 (three) times daily. (Patient not taking: Reported on 08/12/2021) 90 capsule 3   Facility-Administered Medications Prior to Visit  Medication Dose Route Frequency Provider Last Rate Last Admin   denosumab (PROLIA) injection 60 mg  60 mg Subcutaneous Once Einar Pheasant, MD        Review of Systems;  Patient denies headache, fevers, malaise, unintentional weight loss, skin rash, eye pain, sinus congestion and sinus pain, sore throat, dysphagia,  hemoptysis , cough, dyspnea, wheezing, chest pain, palpitations, orthopnea, edema, abdominal pain, nausea, melena, diarrhea, constipation, flank pain,  dysuria, hematuria, urinary  Frequency, nocturia, numbness, tingling, seizures,  Focal weakness, Loss of consciousness,  Tremor, insomnia, depression, anxiety, and suicidal ideation.      Objective:  BP 138/60 (BP Location: Left Arm, Patient Position: Sitting, Cuff Size: Normal)   Pulse 92   Temp (!) 96.8 F (36 C) (Temporal)   Ht 5\' 5"  (1.651 m)   Wt 135 lb (61.2 kg)   SpO2 95%   BMI 22.47 kg/m   BP Readings from Last 3 Encounters:  08/12/21 138/60  08/09/21 (!) 102/59  07/18/21 132/76    Wt Readings from Last 3 Encounters:  08/12/21 135 lb (61.2 kg)  08/09/21 134 lb 14.7 oz (61.2 kg)  07/18/21 135 lb (61.2 kg)    General appearance: alert, cooperative and appears stated age Ears: normal TM's and external ear canals both ears Throat: lips, mucosa, and tongue normal; teeth and gums normal Neck: no adenopathy, no carotid bruit, supple, symmetrical, trachea midline and thyroid not enlarged, symmetric, no tenderness/mass/nodules Back: symmetric, no curvature. ROM normal. No CVA tenderness. Lungs: clear to auscultation bilaterally Heart: regular rate and rhythm, S1, S2 normal, no murmur, click, rub or gallop Abdomen: soft, non-tender; bowel sounds normal; no masses,  no organomegaly Pulses: 2+ and symmetric Skin: Skin color, texture, turgor normal. No rashes or lesions Lymph nodes: Cervical, supraclavicular, and axillary nodes normal.  Lab Results  Component Value Date   HGBA1C 5.4 01/05/2020   HGBA1C 5.5 06/16/2018   HGBA1C 5.7 03/24/2018    Lab Results  Component Value Date   CREATININE 0.95 08/09/2021   CREATININE 0.71 07/10/2021   CREATININE 0.86 01/09/2021    Lab Results  Component Value Date   WBC 7.7 08/09/2021   HGB 12.1 08/09/2021   HCT 35.7 (L) 08/09/2021   PLT 192 08/09/2021   GLUCOSE 197 (H) 08/09/2021   CHOL 149 07/10/2021   TRIG 136.0 07/10/2021   HDL 46.10 07/10/2021   LDLDIRECT 103.0 06/16/2017   LDLCALC 76 07/10/2021   ALT 13 07/10/2021    AST 21 07/10/2021   NA 135 08/09/2021   K 4.3 08/09/2021   CL 105 08/09/2021   CREATININE 0.95 08/09/2021   BUN 28 (H) 08/09/2021   CO2 22 08/09/2021   TSH 0.79 01/09/2021   HGBA1C 5.4 01/05/2020   MICROALBUR 1.0 01/05/2020    No results found.  Assessment & Plan:   Problem List Items Addressed This Visit     PAF (paroxysmal atrial fibrillation) ([place])    Occurred on Nov 12.  treated in ER with IV metoprolol and IV fluids, with spontaneous resolution .  Currently in sinus rhythm .  Taking atenolol 25 mg  Bid .  Seeing Dr Saunders Revel on Nov 16  Other Visit Diagnoses     Atrial fibrillation, unspecified type ([place])    -  Primary   Relevant Orders   EKG 12-Lead (Completed)       I have discontinued Dyann Ruddle Sigl's mupirocin ointment, chlorhexidine, and gabapentin. I am also having her maintain her calcium citrate-vitamin D, acetaminophen, Probiotic Product (ALIGN PO), Vitamin D3, Multiple Vitamins-Minerals (CENTRUM SILVER 50+WOMEN PO), CRANBERRY PO, hyoscyamine, clobetasol ointment, estradiol, omeprazole, ondansetron, ezetimibe, and atenolol. We will continue to administer denosumab.  No orders of the defined types were placed in this encounter.   Medications Discontinued During This Encounter  Medication Reason   chlorhexidine (PERIDEX) 0.12 % solution    gabapentin (NEURONTIN) 100 MG capsule    mupirocin ointment (BACTROBAN) 2 %     Follow-up: No follow-ups on file.   Crecencio Mc, MD

## 2021-08-12 NOTE — Patient Instructions (Addendum)
  You are back In sinus rhythm .  But stay on the atenolol twice daily (every 12 hours)   Gradually increase your water to 60 ounces daily   (sip constantly,  do NOT overload your stomach; that's what causes reflux )  Use miralax daily for the constipation   You can stop the prednisone and gabapentin

## 2021-08-12 NOTE — Assessment & Plan Note (Addendum)
Occurred on Nov 12.  treated in ER with IV metoprolol and IV fluids, with spontaneous resolution  All labs , inaging stiudeis and EKGs reviewed today with patient .  She is currently in sinus rhythm, based on the EKG I ordered and interpreted today and there are no ischemic changes.  Taking atenolol 25 mg  Bid .  Seeing Dr Saunders Revel on Nov 16

## 2021-08-13 ENCOUNTER — Encounter: Payer: Self-pay | Admitting: Internal Medicine

## 2021-08-13 ENCOUNTER — Ambulatory Visit (INDEPENDENT_AMBULATORY_CARE_PROVIDER_SITE_OTHER): Payer: Medicare Other | Admitting: Internal Medicine

## 2021-08-13 ENCOUNTER — Ambulatory Visit (INDEPENDENT_AMBULATORY_CARE_PROVIDER_SITE_OTHER): Payer: Medicare Other

## 2021-08-13 VITALS — BP 130/80 | HR 61 | Ht 65.0 in | Wt 135.0 lb

## 2021-08-13 DIAGNOSIS — I471 Supraventricular tachycardia, unspecified: Secondary | ICD-10-CM | POA: Insufficient documentation

## 2021-08-13 DIAGNOSIS — I10 Essential (primary) hypertension: Secondary | ICD-10-CM | POA: Diagnosis not present

## 2021-08-13 NOTE — Patient Instructions (Signed)
Medication Instructions:   Your physician recommends that you continue on your current medications as directed. Please refer to the Current Medication list given to you today.  *If you need a refill on your cardiac medications before your next appointment, please call your pharmacy*   Lab Work:  None ordered  Testing/Procedures:  Your physician has recommended that you wear a Zio XT monitor for TWO WEEKS.   This monitor is a medical device that records the heart's electrical activity. Doctors most often use these monitors to diagnose arrhythmias. Arrhythmias are problems with the speed or rhythm of the heartbeat. The monitor is a small device applied to your chest. You can wear one while you do your normal daily activities. While wearing this monitor if you have any symptoms to push the button and record what you felt. Once you have worn this monitor for the period of time provider prescribed (Usually 14 days), you will return the monitor device in the postage paid box. Once it is returned they will download the data collected and provide [place] with a report which the provider will then review and we will call you with those results. Important tips:  Avoid showering during the first 24 hours of wearing the monitor. Avoid excessive sweating to help maximize wear time. Do not submerge the device, no hot tubs, and no swimming pools. Keep any lotions or oils away from the patch. After 24 hours you may shower with the patch on. Take brief showers with your back facing the shower head.  Do not remove patch once it has been placed because that will interrupt data and decrease adhesive wear time. Push the button when you have any symptoms and write down what you were feeling. Once you have completed wearing your monitor, remove and place into box which has postage paid and place in your outgoing mailbox.  If for some reason you have misplaced your box then call our office and we can provide another box  and/or mail it off for you.  Please see handout given today in office. You may call Customer Care number: [phone] and select OPTION #4 to discuss out of pocket cost and payment options.   Follow-Up: At [facility], you and your health needs are our priority.  As part of our continuing mission to provide you with exceptional heart care, we have created designated Provider Care Teams.  These Care Teams include your primary Cardiologist (physician) and Advanced Practice Providers (APPs -  Physician Assistants and Nurse Practitioners) who all work together to provide you with the care you need, when you need it.  We recommend signing up for the patient portal called "MyChart".  Sign up information is provided on this After Visit Summary.  MyChart is used to connect with patients for Virtual Visits (Telemedicine).  Patients are able to view lab/test results, encounter notes, upcoming appointments, etc.  Non-urgent messages can be sent to your provider as well.   To learn more about what you can do with MyChart, go to [email or web address].    Your next appointment:    Follow up end of December with APP  The format for your next appointment:   In Person  Provider:   You will see one of the following Advanced Practice Providers on your designated Care Team:   Murray Hodgkins, NP Christell Faith, PA-C Cadence Kathlen Mody, [place]

## 2021-08-13 NOTE — Progress Notes (Signed)
Follow-up Outpatient Visit Date: 08/13/2021  Primary Care Provider: Crecencio Mc, MD [address] [place] [address]  Chief Complaint: Palpitations with concern for atrial fibrillation  HPI:  Brenda Cardenas is a 87 y.o. female with history of  hypertension, hyperlipidemia, GERD, IBS, arthritis, and low back pain, who presents for evaluation of newly diagnosed atrial fibrillation.  I saw her once a year ago for evaluation of abnormal EKG demonstrating left anterior fascicular block.  She was asymptomatic at the time.  Echocardiogram showed moderate asymmetric LVH of the basal septum and grade 2 diastolic dysfunction.  Aortic sclerosis was also noted.  As needed follow-up was recommended.  She presented to the [facility] emergency department last week with palpitations and was found to be tachycardic with concern for atrial fibrillation.  She noted having recently started gabapentin and prednisone for back pain.  She converted to sinus rhythm after receiving IV metoprolol.  She was discharged on atenolol 25 mg twice daily (had been taking 25 mg daily before this).  She was not placed on anticoagulation.  Today, [name] reports that she is feeling back to her baseline.  She continues to have back and left leg pain consistent with her sciatica.  She has not had any further palpitations and is tolerating increased dose of atenolol well.  She notes that on the day that she presented to the ED, she had a sustained period of palpitations lasting a few hours that did not terminate with carotid massage.  She has experienced intermittent palpitations in the past and is typically able to terminate them with carotid massage.  She therefore had a neighbor take her to the emergency department.  She denies having had any chest pain, shortness of breath, and lightheadedness, though at the time that her palpitations began last week, she felt like her vision was off, almost as if she had double vision.   She is no longer taking prednisone or gabapentin, as she believes that these medications (particularly prednisone) may have precipitated her arrhythmia  --------------------------------------------------------------------------------------------------  Cardiovascular History & Procedures: Cardiovascular Problems: Tachycardia (suspect SVT)   Risk Factors: Hypertension, hyperlipidemia, and age > 46   Cath/PCI: None   CV Surgery: None   EP Procedures and Devices: None   Non-Invasive Evaluation(s): TTE (08/20/2020): Normal LV size with moderate asymmetric hypertrophy of the basal septum.  LVEF 60-65% with normal wall motion and grade 2 diastolic dysfunction with elevated filling pressure.  Mild right atrial enlargement with normal contraction.  Normal biatrial size.  Trivial mitral regurgitation.  Aortic sclerosis with trivial regurgitation.  Normal CVP and PA pressure.  Recent CV Pertinent Labs: Lab Results  Component Value Date   CHOL 149 07/10/2021   HDL 46.10 07/10/2021   LDLCALC 76 07/10/2021   LDLDIRECT 103.0 06/16/2017   TRIG 136.0 07/10/2021   CHOLHDL 3 07/10/2021   K 4.3 08/09/2021   MG 2.1 08/09/2021   BUN 28 (H) 08/09/2021   CREATININE 0.95 08/09/2021    Past medical and surgical history were reviewed and updated in EPIC.  Current Meds  Medication Sig   acetaminophen (TYLENOL) 325 MG tablet Take 650 mg by mouth 2 (two) times daily.    atenolol (TENORMIN) 25 MG tablet Take 1 tablet (25 mg) by mouth twice daily   Calcium Citrate-Vitamin D (CITRACAL + D PO) Take 2 capsules by mouth every other day. Alternates with Vitamin D3 and centrum silver   Cholecalciferol (VITAMIN D) 50 MCG (2000 UT) CAPS Take 1  capsule by mouth every other day.   CRANBERRY PO Take 2 capsules by mouth daily.    estradiol (ESTRACE) 0.1 MG/GM vaginal cream estradiol 0.01% (0.1 mg/gram) vaginal cream   ezetimibe (ZETIA) 10 MG tablet TAKE 1 TABLET BY MOUTH DAILY   hyoscyamine (ANASPAZ) 0.125 MG  TBDP disintergrating tablet Place 1 tablet (0.125 mg total) under the tongue every 6 (six) hours as needed (as needed for esophageal spasm).   Multiple Vitamins-Minerals (CENTRUM SILVER 50+WOMEN PO) Take by mouth. Takes 1 tablet every third day. Alternates with citracal and Vitamin D.   omeprazole (PRILOSEC) 20 MG capsule TAKE 1 CAPSULE BY MOUTH EVERY DAY   ondansetron (ZOFRAN ODT) 4 MG disintegrating tablet Take 1 tablet (4 mg total) by mouth every 8 (eight) hours as needed for nausea or vomiting.   Probiotic Product (ALIGN PO) Take 1 tablet by mouth daily.    Current Facility-Administered Medications for the 08/13/21 encounter (Office Visit) with Kahlea Cobert, Harrell Gave, MD  Medication   denosumab (PROLIA) injection 60 mg    Allergies: Ciprofloxacin, Clindamycin/lincomycin, Sulfa antibiotics, Augmentin [amoxicillin-pot clavulanate], Naproxen sodium, Prednisone, Tramadol, Codeine, Doxycycline hyclate, E-mycin [erythromycin], Macrobid [nitrofurantoin], Nabumetone, Nitrofurantoin monohyd macro, Nsaids, Phenobarbital, and Statins  Social History   Tobacco Use   Smoking status: Never   Smokeless tobacco: Never  Vaping Use   Vaping Use: Never used  Substance Use Topics   Alcohol use: No    Alcohol/week: 0.0 standard drinks   Drug use: No    Family History  Problem Relation Age of Onset   Heart disease Mother    Arthritis Mother    Diabetes Father     Review of Systems: A 12-system review of systems was performed and was negative except as noted in the HPI.  --------------------------------------------------------------------------------------------------  Physical Exam: BP 130/80 (BP Location: Left Arm, Patient Position: Sitting, Cuff Size: Normal)   Pulse 61   Ht 5\' 5"  (1.651 m)   Wt 135 lb (61.2 kg)   SpO2 95%   BMI 22.47 kg/m   General:  NAD. Neck: No JVD or HJR. Lungs: Clear to auscultation bilaterally without wheezes or crackles. Heart: Regular rate and rhythm without  murmurs, rubs, or gallops. Abdomen: Soft, nontender, nondistended. Extremities: 1+ LLE edema with ankle brace in place, trace RLE edema.  EKG: Normal sinus rhythm with left axis deviation.  Otherwise, no significant abnormality  Lab Results  Component Value Date   WBC 7.7 08/09/2021   HGB 12.1 08/09/2021   HCT 35.7 (L) 08/09/2021   MCV 97.0 08/09/2021   PLT 192 08/09/2021    Lab Results  Component Value Date   NA 135 08/09/2021   K 4.3 08/09/2021   CL 105 08/09/2021   CO2 22 08/09/2021   BUN 28 (H) 08/09/2021   CREATININE 0.95 08/09/2021   GLUCOSE 197 (H) 08/09/2021   ALT 13 07/10/2021    Lab Results  Component Value Date   CHOL 149 07/10/2021   HDL 46.10 07/10/2021   LDLCALC 76 07/10/2021   LDLDIRECT 103.0 06/16/2017   TRIG 136.0 07/10/2021   CHOLHDL 3 07/10/2021    --------------------------------------------------------------------------------------------------  ASSESSMENT AND PLAN: Supraventricular tachycardia: I have reviewed the patient's EKG from ED visit last weekend.  It shows a narrow complex tachycardia with what appears to be a retrograde P wave.  This is most consistent with supraventricular tachycardia rather than atrial fibrillation.  I also do not see flutter waves to suggest atrial flutter.  Unfortunately, there are no rhythm strips from her ED visit  to demonstrate what her rhythm and heart rate were immediately before and after administration of metoprolol.  Given that diagnosis of atrial fibrillation or atrial flutter would warrant indefinite anticoagulation given a CHA2DS2-VASc score of 4, I think it is important to make this distinction.  I have therefore recommended that we have Ms. Mckinlay wear a 14-day event monitor for further evaluation.  I think it is reasonable for her to continue atenolol 25 mg twice daily.  We will defer initiation of anticoagulation given suspicion that her tachyarrhythmia represents SVT rather than atrial fibrillation/flutter.   She can continue to use vagal maneuvers, including carotid massage, for recurrent symptoms.  We will defer repeating echocardiogram given relatively benign study last year.  Hypertension: Blood pressure reasonable today.  Continue current dose of atenolol.  Follow-up: Return to clinic in 6 weeks.  Nelva Bush, MD 08/13/2021 3:48 PM

## 2021-08-14 ENCOUNTER — Telehealth: Payer: Self-pay | Admitting: *Deleted

## 2021-08-14 NOTE — Telephone Encounter (Signed)
Approval submitted 10/06/21

## 2021-08-17 ENCOUNTER — Encounter: Payer: Self-pay | Admitting: Internal Medicine

## 2021-08-18 DIAGNOSIS — I471 Supraventricular tachycardia: Secondary | ICD-10-CM | POA: Diagnosis not present

## 2021-08-20 ENCOUNTER — Other Ambulatory Visit: Payer: Self-pay | Admitting: Internal Medicine

## 2021-08-26 ENCOUNTER — Encounter: Payer: Self-pay | Admitting: Internal Medicine

## 2021-08-26 ENCOUNTER — Encounter: Payer: Self-pay | Admitting: *Deleted

## 2021-08-26 ENCOUNTER — Telehealth: Payer: Self-pay

## 2021-08-26 NOTE — Telephone Encounter (Signed)
See My chart message sent.

## 2021-08-26 NOTE — Telephone Encounter (Signed)
Pt called in regards to prolia inj. Pt stated she was advised to call beginning of December to make sure she is set up for Jan to get her prolia inj.

## 2021-08-26 NOTE — Telephone Encounter (Signed)
Called and spoke with patient.

## 2021-08-26 NOTE — Telephone Encounter (Signed)
Reverification sent for 10/06/2021

## 2021-08-26 NOTE — Telephone Encounter (Signed)
Brenda Cardenas, yes my insurance is still the same. Medicare and  the state [facility].    Brenda Cardenas  P [facility] Clinical Pool (supporting Crecencio Mc, MD) 5 minutes ago (9:53 AM)   Brenda Cardenas, thanks for letting me know you can't request approval for my Polia injection until September 28, 2021 which is the day after tomorrow. Thank you so much for letting me know. Regards,  Brenda Due, RN  Brenda Cardenas 26 minutes ago (9:32 AM)   [name] I cannot sub mit your approval until 09/28/2021 or after Cardenas to new fiscal year. Is your insurance the same ?

## 2021-08-26 NOTE — Telephone Encounter (Signed)
Juliann Pulse, when you gave  me the card after my injection in July you asked we to call the office in December in order to have time to get approval in time for October 09, 2021. In my above reply I meant to say December 1st is the day after tomorrow not January 1st.  [name]

## 2021-09-09 ENCOUNTER — Ambulatory Visit: Payer: Medicare Other | Attending: Anesthesiology | Admitting: Anesthesiology

## 2021-09-09 ENCOUNTER — Other Ambulatory Visit: Payer: Self-pay

## 2021-09-09 ENCOUNTER — Encounter: Payer: Self-pay | Admitting: Anesthesiology

## 2021-09-09 VITALS — BP 162/63 | HR 67 | Temp 97.1°F | Resp 16 | Ht 65.0 in | Wt 135.0 lb

## 2021-09-09 DIAGNOSIS — M25551 Pain in right hip: Secondary | ICD-10-CM | POA: Insufficient documentation

## 2021-09-09 DIAGNOSIS — M48061 Spinal stenosis, lumbar region without neurogenic claudication: Secondary | ICD-10-CM

## 2021-09-09 DIAGNOSIS — M5136 Other intervertebral disc degeneration, lumbar region: Secondary | ICD-10-CM | POA: Insufficient documentation

## 2021-09-09 DIAGNOSIS — M545 Low back pain, unspecified: Secondary | ICD-10-CM | POA: Insufficient documentation

## 2021-09-09 DIAGNOSIS — G8929 Other chronic pain: Secondary | ICD-10-CM | POA: Insufficient documentation

## 2021-09-09 DIAGNOSIS — M4125 Other idiopathic scoliosis, thoracolumbar region: Secondary | ICD-10-CM

## 2021-09-09 DIAGNOSIS — M79605 Pain in left leg: Secondary | ICD-10-CM | POA: Diagnosis present

## 2021-09-09 DIAGNOSIS — M5432 Sciatica, left side: Secondary | ICD-10-CM | POA: Diagnosis present

## 2021-09-09 NOTE — Patient Instructions (Signed)

## 2021-09-09 NOTE — Progress Notes (Signed)
Safety precautions to be maintained throughout the outpatient stay will include: orient to surroundings, keep bed in low position, maintain call bell within reach at all times, provide assistance with transfer out of bed and ambulation.  

## 2021-09-10 ENCOUNTER — Telehealth: Payer: Self-pay | Admitting: *Deleted

## 2021-09-10 ENCOUNTER — Encounter: Payer: Self-pay | Admitting: Internal Medicine

## 2021-09-10 DIAGNOSIS — I471 Supraventricular tachycardia: Secondary | ICD-10-CM

## 2021-09-10 NOTE — Telephone Encounter (Signed)
-----   Message from Nelva Bush, MD sent at 09/10/2021  2:11 PM EST ----- Please let Ms. Vanengen know that her event monitor showed several episodes of supraventricular tachycardia, consistent with what was observed during her recent ED visit.  There was no evidence of atrial fibrillation.  I recommend EP consultation to discuss further management of her SVT.

## 2021-09-10 NOTE — Telephone Encounter (Signed)
Called and spoke with pt. Notified of event monitor results and Dr. Darnelle Bos recc below.  Pt voiced understanding, however, asks that I also send her a copy of this result note to her MyChart so that she can review when she is not driving.   EP referral placed. Pt aware that she will be contacted by our scheduling to arrange EP consultation.  Advised pt call back with any further questions after she does review MyChart message with result note.  Pt has no further questions at this time.   Forwarding to scheduling.

## 2021-09-12 NOTE — Telephone Encounter (Signed)
EP consult scheduled 10/29/21 with Dr. Quentin Ore.

## 2021-10-08 ENCOUNTER — Encounter: Payer: Self-pay | Admitting: Anesthesiology

## 2021-10-08 ENCOUNTER — Ambulatory Visit (HOSPITAL_BASED_OUTPATIENT_CLINIC_OR_DEPARTMENT_OTHER): Payer: Medicare Other | Admitting: Anesthesiology

## 2021-10-08 ENCOUNTER — Other Ambulatory Visit: Payer: Self-pay

## 2021-10-08 ENCOUNTER — Ambulatory Visit
Admission: RE | Admit: 2021-10-08 | Discharge: 2021-10-08 | Disposition: A | Discharge status: Home / Self Care | Payer: Medicare Other | Source: Ambulatory Visit | Attending: Anesthesiology | Admitting: Anesthesiology

## 2021-10-08 ENCOUNTER — Telehealth: Payer: Self-pay | Admitting: Internal Medicine

## 2021-10-08 ENCOUNTER — Other Ambulatory Visit: Payer: Self-pay | Admitting: Anesthesiology

## 2021-10-08 VITALS — BP 158/97 | HR 62 | Temp 97.2°F | Resp 18 | Ht 65.0 in | Wt 134.0 lb

## 2021-10-08 DIAGNOSIS — M4125 Other idiopathic scoliosis, thoracolumbar region: Secondary | ICD-10-CM | POA: Diagnosis not present

## 2021-10-08 DIAGNOSIS — M5432 Sciatica, left side: Secondary | ICD-10-CM

## 2021-10-08 DIAGNOSIS — M25551 Pain in right hip: Secondary | ICD-10-CM

## 2021-10-08 DIAGNOSIS — M5441 Lumbago with sciatica, right side: Secondary | ICD-10-CM

## 2021-10-08 DIAGNOSIS — M48061 Spinal stenosis, lumbar region without neurogenic claudication: Secondary | ICD-10-CM

## 2021-10-08 DIAGNOSIS — M79605 Pain in left leg: Secondary | ICD-10-CM

## 2021-10-08 DIAGNOSIS — R52 Pain, unspecified: Secondary | ICD-10-CM | POA: Insufficient documentation

## 2021-10-08 DIAGNOSIS — M545 Low back pain, unspecified: Secondary | ICD-10-CM | POA: Diagnosis not present

## 2021-10-08 DIAGNOSIS — M5136 Other intervertebral disc degeneration, lumbar region: Secondary | ICD-10-CM

## 2021-10-08 DIAGNOSIS — G8929 Other chronic pain: Secondary | ICD-10-CM

## 2021-10-08 DIAGNOSIS — M51369 Other intervertebral disc degeneration, lumbar region without mention of lumbar back pain or lower extremity pain: Secondary | ICD-10-CM

## 2021-10-08 MED ORDER — ROPIVACAINE HCL 2 MG/ML IJ SOLN
10.0000 mL | Freq: Once | INTRAMUSCULAR | Status: AC
  Administered 2021-10-08: 10 mL via EPIDURAL

## 2021-10-08 MED ORDER — SODIUM CHLORIDE 0.9% FLUSH
10.0000 mL | Freq: Once | INTRAVENOUS | Status: AC
  Administered 2021-10-08: 10 mL

## 2021-10-08 MED ORDER — TRIAMCINOLONE ACETONIDE 40 MG/ML IJ SUSP
INTRAMUSCULAR | Status: AC
  Filled 2021-10-08: qty 1

## 2021-10-08 MED ORDER — SODIUM CHLORIDE (PF) 0.9 % IJ SOLN
INTRAMUSCULAR | Status: AC
  Filled 2021-10-08: qty 10

## 2021-10-08 MED ORDER — IOPAMIDOL (ISOVUE-M 200) INJECTION 41%
20.0000 mL | Freq: Once | INTRAMUSCULAR | Status: DC | PRN
  Administered 2021-10-08: 20 mL

## 2021-10-08 MED ORDER — TRIAMCINOLONE ACETONIDE 40 MG/ML IJ SUSP
40.0000 mg | Freq: Once | INTRAMUSCULAR | Status: AC
  Administered 2021-10-08: 40 mg

## 2021-10-08 MED ORDER — IOHEXOL 180 MG/ML  SOLN
INTRAMUSCULAR | Status: AC
  Filled 2021-10-08: qty 20

## 2021-10-08 MED ORDER — LIDOCAINE HCL (PF) 1 % IJ SOLN
INTRAMUSCULAR | Status: AC
  Filled 2021-10-08: qty 10

## 2021-10-08 MED ORDER — LIDOCAINE HCL (PF) 1 % IJ SOLN
5.0000 mL | Freq: Once | INTRAMUSCULAR | Status: AC
  Administered 2021-10-08: 5 mL via SUBCUTANEOUS

## 2021-10-08 MED ORDER — ROPIVACAINE HCL 2 MG/ML IJ SOLN
INTRAMUSCULAR | Status: AC
  Filled 2021-10-08: qty 20

## 2021-10-08 NOTE — Progress Notes (Signed)
Subjective:  Patient ID: Brenda Cardenas, female    DOB: 07/23/1932  Age: 88 y.o. MRN: [id number]  CC: Back Pain (Low back buttocks)   Procedure: None  HPI Taytem Ghattas presents for for evaluation today unfortunately she continues to complain of severe diffuse osteoarthritic complaints.  This is unfortunately her baseline.  She feels that she has had a recent worsening in her low back pain and some spasming associated with that in the lumbar region.  This is a gnawing aching right unremitting pain that radiates into the hip and buttock region.  No changes in lower extremity strength or function are noted at this time.  Her bowel bladder functions been reasonable as well.  Outpatient Medications Prior to Visit  Medication Sig Dispense Refill   acetaminophen (TYLENOL) 325 MG tablet Take 650 mg by mouth 2 (two) times daily.      atenolol (TENORMIN) 25 MG tablet Take 1 tablet (25 mg total) by mouth 2 (two) times daily. 180 tablet 1   Calcium Citrate-Vitamin D (CITRACAL + D PO) Take 2 capsules by mouth every other day. Alternates with Vitamin D3 and centrum silver     Cholecalciferol (VITAMIN D) 50 MCG (2000 UT) CAPS Take 1 capsule by mouth every other day.     CRANBERRY PO Take 2 capsules by mouth daily.      estradiol (ESTRACE) 0.1 MG/GM vaginal cream estradiol 0.01% (0.1 mg/gram) vaginal cream     ezetimibe (ZETIA) 10 MG tablet TAKE 1 TABLET BY MOUTH DAILY 90 tablet 3   hyoscyamine (ANASPAZ) 0.125 MG TBDP disintergrating tablet Place 1 tablet (0.125 mg total) under the tongue every 6 (six) hours as needed (as needed for esophageal spasm). 30 tablet 0   Multiple Vitamins-Minerals (CENTRUM SILVER 50+WOMEN PO) Take by mouth. Takes 1 tablet every third day. Alternates with citracal and Vitamin D.     omeprazole (PRILOSEC) 20 MG capsule TAKE 1 CAPSULE BY MOUTH EVERY DAY 90 capsule 1   ondansetron (ZOFRAN ODT) 4 MG disintegrating tablet Take 1 tablet (4 mg total) by mouth every 8 (eight) hours as  needed for nausea or vomiting. 20 tablet 0   Probiotic Product (ALIGN PO) Take 1 tablet by mouth daily.      Facility-Administered Medications Prior to Visit  Medication Dose Route Frequency Provider Last Rate Last Admin   denosumab (PROLIA) injection 60 mg  60 mg Subcutaneous Once Einar Pheasant, MD        Review of Systems CNS: No confusion or sedation Cardiac: No angina or palpitations GI: No abdominal pain or constipation Constitutional: No nausea vomiting fevers or chills  Objective:  BP (!) 162/63 (BP Location: Right Arm, Patient Position: Sitting, Cuff Size: Normal)    Pulse 67    Temp (!) 97.1 F (36.2 C)    Resp 16    Ht 5\' 5"  (1.651 m)    Wt 135 lb (61.2 kg)    SpO2 100%    BMI 22.47 kg/m    BP Readings from Last 3 Encounters:  09/09/21 (!) 162/63  08/13/21 130/80  08/12/21 138/60     Wt Readings from Last 3 Encounters:  09/09/21 135 lb (61.2 kg)  08/13/21 135 lb (61.2 kg)  08/12/21 135 lb (61.2 kg)     Physical Exam Pt is alert and oriented PERRL EOMI HEART IS RRR no murmur or rub LCTA no wheezing or rales MUSCULOSKELETAL she does have evidence of 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  She ambulates with a very antalgic gait and requires assistance her muscle tone and bulk is at baseline  Labs  Lab Results  Component Value Date   HGBA1C 5.4 01/05/2020   HGBA1C 5.5 06/16/2018   HGBA1C 5.7 03/24/2018   Lab Results  Component Value Date   MICROALBUR 1.0 01/05/2020   LDLCALC 76 07/10/2021   CREATININE 0.95 08/09/2021    -------------------------------------------------------------------------------------------------------------------- Lab Results  Component Value Date   WBC 7.7 08/09/2021   HGB 12.1 08/09/2021   HCT 35.7 (L) 08/09/2021   PLT 192 08/09/2021   GLUCOSE 197 (H) 08/09/2021   CHOL 149 07/10/2021   TRIG 136.0 07/10/2021   HDL 46.10  07/10/2021   LDLDIRECT 103.0 06/16/2017   LDLCALC 76 07/10/2021   ALT 13 07/10/2021   AST 21 07/10/2021   NA 135 08/09/2021   K 4.3 08/09/2021   CL 105 08/09/2021   CREATININE 0.95 08/09/2021   BUN 28 (H) 08/09/2021   CO2 22 08/09/2021   TSH 0.79 01/09/2021   HGBA1C 5.4 01/05/2020   MICROALBUR 1.0 01/05/2020    --------------------------------------------------------------------------------------------------------------------- No results found.   Assessment & Plan:   Alvie was seen today for back pain.  Diagnoses and all orders for this visit:  Spinal stenosis of lumbar region without neurogenic claudication  DDD (degenerative disc disease), lumbar  Other idiopathic scoliosis, thoracolumbar region  Low back pain radiating to left leg  Chronic right-sided low back pain without sciatica  Pain of right hip joint  Sciatica, left side        ----------------------------------------------------------------------------------------------------------------------  Problem List Items Addressed This Visit       Unprioritized   Idiopathic scoliosis   Low back pain radiating to left leg   Sciatica, left side   Spinal stenosis of lumbar region - Primary   Other Visit Diagnoses     DDD (degenerative disc disease), lumbar       Chronic right-sided low back pain without sciatica       Pain of right hip joint             ----------------------------------------------------------------------------------------------------------------------  1. Spinal stenosis of lumbar region without neurogenic claudication Continue efforts at stretching and core strengthening and continue with Tylenol for pain relief.  I do not feel that interventional therapy with epidural steroid injections will be of benefit as these have not given her much relief recently.  2. DDD (degenerative disc disease), lumbar As above.  We have talked about options regarding low-dose opioid  therapy  3. Other idiopathic scoliosis, thoracolumbar region   4. Low back pain radiating to left leg She may be a candidate for trigger point injection and we have discussed this option and she desires to proceed with this at the next available date.  5. Chronic right-sided low back pain without sciatica   6. Pain of right hip joint   7. Sciatica, left side     ----------------------------------------------------------------------------------------------------------------------  I am having [name] maintain her acetaminophen, Probiotic Product (ALIGN PO), CRANBERRY PO, hyoscyamine, estradiol, ondansetron, ezetimibe, Vitamin D, Calcium Citrate-Vitamin D (CITRACAL + D PO), Multiple Vitamins-Minerals (CENTRUM SILVER 50+WOMEN PO), atenolol, and omeprazole. We will continue to administer denosumab.   No orders of the defined types were placed in this encounter.  Patient's Medications  New Prescriptions   No medications on file  Previous Medications   ACETAMINOPHEN (TYLENOL) 325 MG TABLET    Take 650 mg by mouth 2 (two) times  daily.    ATENOLOL (TENORMIN) 25 MG TABLET    Take 1 tablet (25 mg total) by mouth 2 (two) times daily.   CALCIUM CITRATE-VITAMIN D (CITRACAL + D PO)    Take 2 capsules by mouth every other day. Alternates with Vitamin D3 and centrum silver   CHOLECALCIFEROL (VITAMIN D) 50 MCG (2000 UT) CAPS    Take 1 capsule by mouth every other day.   CRANBERRY PO    Take 2 capsules by mouth daily.    ESTRADIOL (ESTRACE) 0.1 MG/GM VAGINAL CREAM    estradiol 0.01% (0.1 mg/gram) vaginal cream   EZETIMIBE (ZETIA) 10 MG TABLET    TAKE 1 TABLET BY MOUTH DAILY   HYOSCYAMINE (ANASPAZ) 0.125 MG TBDP DISINTERGRATING TABLET    Place 1 tablet (0.125 mg total) under the tongue every 6 (six) hours as needed (as needed for esophageal spasm).   MULTIPLE VITAMINS-MINERALS (CENTRUM SILVER 50+WOMEN PO)    Take by mouth. Takes 1 tablet every third day. Alternates with citracal and Vitamin  D.   OMEPRAZOLE (PRILOSEC) 20 MG CAPSULE    TAKE 1 CAPSULE BY MOUTH EVERY DAY   ONDANSETRON (ZOFRAN ODT) 4 MG DISINTEGRATING TABLET    Take 1 tablet (4 mg total) by mouth every 8 (eight) hours as needed for nausea or vomiting.   PROBIOTIC PRODUCT (ALIGN PO)    Take 1 tablet by mouth daily.   Modified Medications   No medications on file  Discontinued Medications   No medications on file   ----------------------------------------------------------------------------------------------------------------------  Follow-up: Return in about 1 month (around 10/10/2021) for evaluation, procedure.    Molli Barrows, MD

## 2021-10-08 NOTE — Progress Notes (Signed)
Safety precautions to be maintained throughout the outpatient stay will include: orient to surroundings, keep bed in low position, maintain call bell within reach at all times, provide assistance with transfer out of bed and ambulation.  

## 2021-10-08 NOTE — Patient Instructions (Signed)
Pain Management Discharge Instructions  General Discharge Instructions :  If you need to reach your doctor call: Monday-Friday 8:00 am - 4:00 pm at [phone] or toll free [phone].  After clinic hours [phone] to have operator reach doctor.  Bring all of your medication bottles to all your appointments in the pain clinic.  To cancel or reschedule your appointment with Pain Management please remember to call 24 hours in advance to avoid a fee.  Refer to the educational materials which you have been given on: General Risks, I had my Procedure. Discharge Instructions, Post Sedation.  Post Procedure Instructions:  The drugs you were given will stay in your system until tomorrow, so for the next 24 hours you should not drive, make any legal decisions or drink any alcoholic beverages.  You may eat anything you prefer, but it is better to start with liquids then soups and crackers, and gradually work up to solid foods.  Please notify your doctor immediately if you have any unusual bleeding, trouble breathing or pain that is not related to your normal pain.  Depending on the type of procedure that was done, some parts of your body may feel week and/or numb.  This usually clears up by tonight or the next day.  Walk with the use of an assistive device or accompanied by an adult for the 24 hours.  You may use ice on the affected area for the first 24 hours.  Put ice in a Ziploc bag and cover with a towel and place against area 15 minutes on 15 minutes off.  You may switch to heat after 24 hours.Epidural Steroid Injection Patient Information  Description: The epidural space surrounds the nerves as they exit the spinal cord.  In some patients, the nerves can be compressed and inflamed by a bulging disc or a tight spinal canal (spinal stenosis).  By injecting steroids into the epidural space, we can bring irritated nerves into direct contact with a potentially helpful medication.  These  steroids act directly on the irritated nerves and can reduce swelling and inflammation which often leads to decreased pain.  Epidural steroids may be injected anywhere along the spine and from the neck to the low back depending upon the location of your pain.   After numbing the skin with local anesthetic (like Novocaine), a small needle is passed into the epidural space slowly.  You may experience a sensation of pressure while this is being done.  The entire block usually last less than 10 minutes.  Conditions which may be treated by epidural steroids:  Low back and leg pain Neck and arm pain Spinal stenosis Post-laminectomy syndrome Herpes zoster (shingles) pain Pain from compression fractures  Preparation for the injection:  Do not eat any solid food or dairy products within 8 hours of your appointment.  You may drink clear liquids up to 3 hours before appointment.  Clear liquids include water, black coffee, juice or soda.  No milk or cream please. You may take your regular medication, including pain medications, with a sip of water before your appointment  Diabetics should hold regular insulin (if taken separately) and take 1/2 normal NPH dos the morning of the procedure.  Carry some sugar containing items with you to your appointment. A driver must accompany you and be prepared to drive you home after your procedure.  Bring all your current medications with your. An IV may be inserted and sedation may be given at the discretion of the physician.     A blood pressure cuff, EKG and other monitors will often be applied during the procedure.  Some patients may need to have extra oxygen administered for a short period. You will be asked to provide medical information, including your allergies, prior to the procedure.  We must know immediately if you are taking blood thinners (like Coumadin/Warfarin)  Or if you are allergic to IV iodine contrast (dye). We must know if you could possible be  pregnant.  Possible side-effects: Bleeding from needle site Infection (rare, may require surgery) Nerve injury (rare) Numbness & tingling (temporary) Difficulty urinating (rare, temporary) Spinal headache ( a headache worse with upright posture) Light -headedness (temporary) Pain at injection site (several days) Decreased blood pressure (temporary) Weakness in arm/leg (temporary) Pressure sensation in back/neck (temporary)  Call if you experience: Fever/chills associated with headache or increased back/neck pain. Headache worsened by an upright position. New onset weakness or numbness of an extremity below the injection site Hives or difficulty breathing (go to the emergency room) Inflammation or drainage at the infection site Severe back/neck pain Any new symptoms which are concerning to you  Please note:  Although the local anesthetic injected can often make your back or neck feel good for several hours after the injection, the pain will likely return.  It takes 3-7 days for steroids to work in the epidural space.  You may not notice any pain relief for at least that one week.  If effective, we will often do a series of three injections spaced 3-6 weeks apart to maximally decrease your pain.  After the initial series, we generally will wait several months before considering a repeat injection of the same type.  If you have any questions, please call [phone] [facility] 

## 2021-10-08 NOTE — Telephone Encounter (Signed)
Spoke with Dr. Derrel Nip verbally and she stated that the steroid would effect her Prolia injection. Pt was made aware and gave a verbal understanding.

## 2021-10-08 NOTE — Telephone Encounter (Signed)
Pt called in stating she is having a procedure on her back this afternoon and it involves steroids and she want to know if the steroids would affect her having the prolia shot on Monday. Pt read that if use the steroids and the prolia shot together it can affect the heart

## 2021-10-09 ENCOUNTER — Telehealth: Payer: Self-pay

## 2021-10-09 NOTE — Telephone Encounter (Signed)
Called PP. No answer,  left message to call if needed. 

## 2021-10-09 NOTE — Progress Notes (Signed)
Office Visit    Patient Name: Brenda Cardenas Date of Encounter: 10/10/2021  PCP:  Crecencio Mc, MD   [place]  Cardiologist:  Nelva Bush, MD  Advanced Practice Provider:  No care team member to display Electrophysiologist:  None    HPI    Brenda Cardenas is a 88 y.o. female with a hx of hypertension, hyperlipidemia, GERD, IBS, arthritis, palpitations, and low back pain presents today for follow-up of her atrial fibrillation.  She was originally seen by Dr. Saunders Revel 07/2020 for evaluation of abnormal EKG demonstrating left anterior fascicular block.  She was asymptomatic at that time.  Echocardiogram showed moderate asymmetric LVH at the basal septum and grade 2 DD.  Aortic sclerosis was also noted.  She was asked to follow-up as needed.  She presented to the [facility] emergency department 06/2021 with palpitations and was found to be tachycardic with concern for atrial fibrillation.  She had recently started gabapentin and prednisone for back pain.  She was converted to normal sinus rhythm after receiving IV metoprolol.  She was discharged on atenolol 25 mg twice daily and not placed on any anticoagulation.  She was seen in the office 07/2021 by Dr. Saunders Revel.  She was feeling much better and back to her baseline.  She had not had any further palpitations and was tolerating the increased dose of atenolol.  She noted that the day she presented to the ED she had a sustained period of palpitations lasting a few hours that did not terminate with carotid massage.  She has experienced intermittent palpitations in the past and is typically able to terminate them with carotid massage.  She denied having any chest pain, shortness of breath, lightheadedness.  She did feel like her vision was off and almost as if she had double vision.  She is no longer taking prednisone or gabapentin and believes that those medications (particularly the prednisone) may have precipitated her  arrhythmia.  Today, she shares that she has had 2 episodes of SVT since November of last year.  She had an episode that lasted for a couple of hours and then Monday a week ago she had an episode that lasted for 45 minutes.  At that time her heart rate was 148.  She continued to monitor her blood pressure and heart rate over the 45 minutes and since she converted she did not pull her cord at [facility].  Her blood pressure was anywhere from 341 systolic to 962 systolic.  She states sometimes she does not feel it at all when she goes into SVT but other times she feels a thumping sensation in her chest and gets a little lightheaded.  She has never passed out.  She continues on her 25 mg twice daily of atenolol.  She has had a lot of issues with sciatica in the past and recently got a steroid injection which helped.  She does try to get some exercise and walks with a walker around her community.  She knows about her follow-up with Dr. Quentin Ore on 10/29/2021.  Reports no shortness of breath nor dyspnea on exertion. Reports no chest pain, pressure, or tightness. No edema, orthopnea, PND.   Past Medical History    Past Medical History:  Diagnosis Date   Arthritis    Basal cell carcinoma 06/04/2010   left upper back   Chronic kidney disease    Colon polyps    Gastritis    GERD (gastroesophageal reflux disease)  Hiatal hernia    Hyperlipidemia    Hypertension    IBS (irritable bowel syndrome)    Low back pain radiating to left leg 03/28/2020   Sciatica, left side 04/15/2020   Squamous cell carcinoma of skin 06/26/2013   left mid pretibial   Squamous cell carcinoma of skin 08/21/2015   lower sternum   Squamous cell carcinoma of skin 10/14/2017   right mid pretibial   Squamous cell carcinoma of skin 03/17/2018   right prox lateral elbow   Squamous cell carcinoma of skin 09/07/2018   right post thigh   Squamous cell carcinoma of skin 05/11/2019   right mid med pretibial   Squamous cell carcinoma  of skin 06/20/2019   right bicep   Past Surgical History:  Procedure Laterality Date   APPENDECTOMY  1959   CARPAL TUNNEL RELEASE     CATARACT EXTRACTION W/ INTRAOCULAR LENS IMPLANT Left    CATARACT EXTRACTION W/PHACO Right 04/20/2018   Procedure: CATARACT EXTRACTION PHACO AND INTRAOCULAR LENS PLACEMENT ([place])  RIGHT;  Surgeon: Leandrew Koyanagi, MD;  Location: [facility];  Service: Ophthalmology;  Laterality: Right;   CHOLECYSTECTOMY  1995   GANGLION CYST EXCISION     JOINT REPLACEMENT Right July 2009   [place]    KNEE ARTHROSCOPY     LITHOTRIPSY     SALIVARY GLAND SURGERY     SKIN CANCER EXCISION     TONSILLECTOMY     TONSILLECTOMY AND ADENOIDECTOMY     VEIN LIGATION      Allergies  Allergies  Allergen Reactions   Ciprofloxacin Diarrhea   Clindamycin/Lincomycin Diarrhea   Sulfa Antibiotics Rash    Rash in throat   Augmentin [Amoxicillin-Pot Clavulanate] Diarrhea   Naproxen Sodium Other (See Comments)    Causes gastritis   Prednisone     Atrial Fibrillation   Tramadol Diarrhea    "upset stomach, headache, dizziness, drowsiness"   Codeine Nausea Only and Rash    Upset stomach   Doxycycline Hyclate Nausea And Vomiting and Nausea Only    Dry heaves.    E-Mycin [Erythromycin] Rash   Macrobid [Nitrofurantoin] Rash    severe   Nabumetone Rash and Other (See Comments)    Upsets liver enzymes   Nitrofurantoin Monohyd Macro Rash   Nsaids Rash    Gastritis, GI ulceration.    Phenobarbital Other (See Comments), Rash and Anxiety    "got wild" Patient becomes very hyper and anxious   Statins Rash    Upsets liver enzymes    EKGs/Labs/Other Studies Reviewed:   The following studies were reviewed today:  08/13/21 Zio monitor results  The patient was monitored for 14 days. The predominant rhythm was sinus with an average rate of 64 bpm (range 41-93 bpm in sinus). There were rare PACs and PVCs. 11 episodes of supraventricular tachycardia were observed,  lasting up to 4 minutes 58 seconds with a maximum rate of 184 bpm. No prolonged pauses occurred. Patient triggered events correspond to SVT.   Predominantly sinus rhythm with rare PACs and PVCs.  Multiple episodes of PSVT observed, lasting up to 5 minutes.  Echocardiogram 08/20/2020  IMPRESSIONS     1. Left ventricular ejection fraction, by estimation, is 60 to 65%. The  left ventricle has normal function. The left ventricle has no regional  wall motion abnormalities. There is moderate asymmetric left ventricular  hypertrophy of the basal-septal  segment. Left ventricular diastolic parameters are consistent with Grade  II diastolic dysfunction (pseudonormalization). Elevated left atrial  pressure.  2. Right ventricular systolic function is normal. The right ventricular  size is mildly enlarged. There is normal pulmonary artery systolic  pressure.   3. The mitral valve is normal in structure. Trivial mitral valve  regurgitation. No evidence of mitral stenosis.   4. The aortic valve is tricuspid. There is mild calcification of the  aortic valve. There is mild thickening of the aortic valve. Aortic valve  regurgitation is trivial. Mild to moderate aortic valve  sclerosis/calcification is present, without any  evidence of aortic stenosis.   5. The inferior vena cava is normal in size with greater than 50%  respiratory variability, suggesting right atrial pressure of 3 mmHg.   FINDINGS   Left Ventricle: Left ventricular ejection fraction, by estimation, is 60  to 65%. The left ventricle has normal function. The left ventricle has no  regional wall motion abnormalities. The left ventricular internal cavity  size was normal in size. There is   moderate asymmetric left ventricular hypertrophy of the basal-septal  segment. Left ventricular diastolic parameters are consistent with Grade  II diastolic dysfunction (pseudonormalization). Elevated left atrial  pressure.   Right Ventricle:  The right ventricular size is mildly enlarged. No  increase in right ventricular wall thickness. Right ventricular systolic  function is normal. There is normal pulmonary artery systolic pressure.  The tricuspid regurgitant velocity is 2.61   m/s, and with an assumed right atrial pressure of 3 mmHg, the estimated  right ventricular systolic pressure is 16.1 mmHg.   Left Atrium: Left atrial size was normal in size.   Right Atrium: Right atrial size was normal in size.   Pericardium: There is no evidence of pericardial effusion. Presence of  pericardial fat pad.   Mitral Valve: The mitral valve is normal in structure. Trivial mitral  valve regurgitation. No evidence of mitral valve stenosis.   Tricuspid Valve: The tricuspid valve is grossly normal. Tricuspid valve  regurgitation is mild.   Aortic Valve: The aortic valve is tricuspid. There is mild calcification  of the aortic valve. There is mild thickening of the aortic valve. Aortic  valve regurgitation is trivial. Mild to moderate aortic valve  sclerosis/calcification is present, without  any evidence of aortic stenosis.   Pulmonic Valve: The pulmonic valve was normal in structure. Pulmonic valve  regurgitation is mild. No evidence of pulmonic stenosis.   Aorta: The aortic root and ascending aorta are structurally normal, with  no evidence of dilitation.   Venous: The inferior vena cava is normal in size with greater than 50%  respiratory variability, suggesting right atrial pressure of 3 mmHg.   IAS/Shunts: The interatrial septum was not well visualized.     EKG:  EKG is not ordered today.    Recent Labs: 01/09/2021: TSH 0.79 07/10/2021: ALT 13 08/09/2021: BUN 28; Creatinine, Ser 0.95; Hemoglobin 12.1; Magnesium 2.1; Platelets 192; Potassium 4.3; Sodium 135  Recent Lipid Panel    Component Value Date/Time   CHOL 149 07/10/2021 0804   TRIG 136.0 07/10/2021 0804   HDL 46.10 07/10/2021 0804   CHOLHDL 3 07/10/2021 0804    VLDL 27.2 07/10/2021 0804   LDLCALC 76 07/10/2021 0804   LDLDIRECT 103.0 06/16/2017 0834    Home Medications   Current Meds  Medication Sig   acetaminophen (TYLENOL) 325 MG tablet Take 650 mg by mouth every 6 (six) hours as needed.   atenolol (TENORMIN) 25 MG tablet Take 1 tablet (25 mg total) by mouth 2 (two) times daily.   atenolol (TENORMIN) 25  MG tablet Take 0.5 tablets (12.5 mg total) by mouth as needed (As needed for fast heart rates).   Calcium Citrate-Vitamin D (CITRACAL + D PO) Take 2 capsules by mouth every other day. Alternates with Vitamin D3 and centrum silver   Cholecalciferol (VITAMIN D) 50 MCG (2000 UT) CAPS Take 1 capsule by mouth every other day.   CRANBERRY PO Take 2 capsules by mouth daily.    estradiol (ESTRACE) 0.1 MG/GM vaginal cream estradiol 0.01% (0.1 mg/gram) vaginal cream   ezetimibe (ZETIA) 10 MG tablet TAKE 1 TABLET BY MOUTH DAILY   hyoscyamine (ANASPAZ) 0.125 MG TBDP disintergrating tablet Place 1 tablet (0.125 mg total) under the tongue every 6 (six) hours as needed (as needed for esophageal spasm).   Multiple Vitamins-Minerals (CENTRUM SILVER 50+WOMEN PO) Take by mouth. Takes 1 tablet every third day. Alternates with citracal and Vitamin D.   omeprazole (PRILOSEC) 20 MG capsule TAKE 1 CAPSULE BY MOUTH EVERY DAY   ondansetron (ZOFRAN ODT) 4 MG disintegrating tablet Take 1 tablet (4 mg total) by mouth every 8 (eight) hours as needed for nausea or vomiting.   Probiotic Product (ALIGN PO) Take 1 tablet by mouth daily.    Current Facility-Administered Medications for the 10/10/21 encounter (Office Visit) with Elgie Collard, PA-C  Medication   denosumab (PROLIA) injection 60 mg     Review of Systems      All other systems reviewed and are otherwise negative except as noted above.  Physical Exam    VS:  BP 130/68 (BP Location: Left Arm, Patient Position: Sitting, Cuff Size: Normal)    Pulse 64    Ht 5\' 5"  (1.651 m)    Wt 135 lb (61.2 kg)    SpO2 97%     BMI 22.47 kg/m  , BMI Body mass index is 22.47 kg/m.  Wt Readings from Last 3 Encounters:  10/10/21 135 lb (61.2 kg)  10/08/21 134 lb (60.8 kg)  09/09/21 135 lb (61.2 kg)     GEN: Well nourished, well developed, in no acute distress. HEENT: normal. Cardiac: RRR, no murmurs, rubs, or gallops. No clubbing, cyanosis, edema.  Radials/PT 2+ and equal bilaterally.  Respiratory:  Respirations regular and unlabored, clear to auscultation bilaterally. GI: Soft, nontender, nondistended. MS: No deformity or atrophy. Skin: Warm and dry, no rash. Neuro:  Strength and sensation are intact. Psych: Normal affect.  Assessment & Plan    SVT -Continue atenolol 25 mg twice daily -Will prescribe her PRN 12.5mg  atenolol to take during an episode of SVT for conversion. -No atrial fibrillation on Zio, therefore no need for anticoagulation -Appointment with EP 2/1 to discuss further therapy -Will order TSH, CMP, CBC, magnesium   Hypertension - BP today is 130/68 -Well controlled at [facility]   Hyperlipidemia -Recent lipid profile 10/22 showed total cholesterol 149, HDL 46, LDL 76, triglycerides 136 -Continue Zetia   Disposition: Follow up in 3 month(s) with Nelva Bush, MD or APP.  Signed, Elgie Collard, PA-C 10/10/2021, 10:21 AM [place]

## 2021-10-10 ENCOUNTER — Other Ambulatory Visit: Payer: Self-pay

## 2021-10-10 ENCOUNTER — Encounter: Payer: Self-pay | Admitting: Physician Assistant

## 2021-10-10 ENCOUNTER — Ambulatory Visit (INDEPENDENT_AMBULATORY_CARE_PROVIDER_SITE_OTHER): Payer: Medicare Other | Admitting: Physician Assistant

## 2021-10-10 VITALS — BP 130/68 | HR 64 | Ht 65.0 in | Wt 135.0 lb

## 2021-10-10 DIAGNOSIS — I10 Essential (primary) hypertension: Secondary | ICD-10-CM | POA: Diagnosis not present

## 2021-10-10 DIAGNOSIS — I471 Supraventricular tachycardia: Secondary | ICD-10-CM

## 2021-10-10 DIAGNOSIS — E782 Mixed hyperlipidemia: Secondary | ICD-10-CM

## 2021-10-10 MED ORDER — ATENOLOL 25 MG PO TABS: 12.5000 mg | ORAL_TABLET | ORAL | 3 refills | Status: DC | PRN

## 2021-10-10 NOTE — Patient Instructions (Signed)
Medication Instructions:  Your physician has recommended you make the following change in your medication:   Take Atenolol 12.5 mg as needed for fast heart rates. You may take this in addition to your other medications.   *If you need a refill on your cardiac medications before your next appointment, please call your pharmacy*   Lab Work: CBC, CMP, TSH, and Mag today  If you have labs (blood work) drawn today and your tests are completely normal, you will receive your results only by: [place] (if you have MyChart) OR A paper copy in the mail If you have any lab test that is abnormal or we need to change your treatment, we will call you to review the results.   Testing/Procedures: None   Follow-Up: At [facility], you and your health needs are our priority.  As part of our continuing mission to provide you with exceptional heart care, we have created designated Provider Care Teams.  These Care Teams include your primary Cardiologist (physician) and Advanced Practice Providers (APPs -  Physician Assistants and Nurse Practitioners) who all work together to provide you with the care you need, when you need it.   Your next appointment:   3 month(s)  The format for your next appointment:   In Person  Provider:   Nelva Bush, MD or Christell Faith, PA-C

## 2021-10-11 LAB — CBC
Hematocrit: 35.1 % (ref 34.0–46.6)
Hemoglobin: 11.6 g/dL (ref 11.1–15.9)
MCH: 32 pg (ref 26.6–33.0)
MCHC: 33 g/dL (ref 31.5–35.7)
MCV: 97 fL (ref 79–97)
Platelets: 198 10*3/uL (ref 150–450)
RBC: 3.63 x10E6/uL — ABNORMAL LOW (ref 3.77–5.28)
RDW: 19.7 % — ABNORMAL HIGH (ref 11.7–15.4)
WBC: 18.7 10*3/uL — ABNORMAL HIGH (ref 3.4–10.8)

## 2021-10-11 LAB — COMPREHENSIVE METABOLIC PANEL
ALT: 12 IU/L (ref 0–32)
AST: 21 IU/L (ref 0–40)
Albumin/Globulin Ratio: 2.2 (ref 1.2–2.2)
Albumin: 4.4 g/dL (ref 3.6–4.6)
Alkaline Phosphatase: 87 IU/L (ref 44–121)
BUN/Creatinine Ratio: 34 — ABNORMAL HIGH (ref 12–28)
BUN: 32 mg/dL — ABNORMAL HIGH (ref 8–27)
Bilirubin Total: 0.5 mg/dL (ref 0.0–1.2)
CO2: 23 mmol/L (ref 20–29)
Calcium: 9.6 mg/dL (ref 8.7–10.3)
Chloride: 102 mmol/L (ref 96–106)
Creatinine, Ser: 0.95 mg/dL (ref 0.57–1.00)
Globulin, Total: 2 g/dL (ref 1.5–4.5)
Glucose: 80 mg/dL (ref 70–99)
Potassium: 4.5 mmol/L (ref 3.5–5.2)
Sodium: 138 mmol/L (ref 134–144)
Total Protein: 6.4 g/dL (ref 6.0–8.5)
eGFR: 58 mL/min/{1.73_m2} — ABNORMAL LOW (ref >59)

## 2021-10-11 LAB — TSH: TSH: 1.73 u[IU]/mL (ref 0.450–4.500)

## 2021-10-11 LAB — MAGNESIUM: Magnesium: 2.2 mg/dL (ref 1.6–2.3)

## 2021-10-12 ENCOUNTER — Encounter: Payer: Self-pay | Admitting: Internal Medicine

## 2021-10-13 ENCOUNTER — Other Ambulatory Visit: Payer: Self-pay

## 2021-10-13 ENCOUNTER — Encounter: Payer: Self-pay | Admitting: Family

## 2021-10-13 ENCOUNTER — Telehealth: Payer: Self-pay | Admitting: Physician Assistant

## 2021-10-13 ENCOUNTER — Telehealth: Payer: Self-pay | Admitting: Internal Medicine

## 2021-10-13 ENCOUNTER — Ambulatory Visit (INDEPENDENT_AMBULATORY_CARE_PROVIDER_SITE_OTHER): Payer: Medicare Other

## 2021-10-13 DIAGNOSIS — D72828 Other elevated white blood cell count: Secondary | ICD-10-CM | POA: Diagnosis not present

## 2021-10-13 DIAGNOSIS — N343 Urethral syndrome, unspecified: Secondary | ICD-10-CM

## 2021-10-13 DIAGNOSIS — M810 Age-related osteoporosis without current pathological fracture: Secondary | ICD-10-CM | POA: Diagnosis not present

## 2021-10-13 DIAGNOSIS — M545 Low back pain, unspecified: Secondary | ICD-10-CM | POA: Diagnosis not present

## 2021-10-13 MED ORDER — DENOSUMAB 60 MG/ML ~~LOC~~ SOSY
60.0000 mg | PREFILLED_SYRINGE | Freq: Once | SUBCUTANEOUS | Status: AC
  Administered 2021-10-27: 60 mg via SUBCUTANEOUS

## 2021-10-13 NOTE — Telephone Encounter (Signed)
Approved scheduled.

## 2021-10-13 NOTE — Telephone Encounter (Signed)
Spoke with patient and reviewed her primary care provider office should be in touch. She stated they have called her and her mind is now at rest. She was appreciative for the call back with no further questions at this time.

## 2021-10-13 NOTE — Telephone Encounter (Signed)
Spoke with patient concerning message received from Nurse at cardiology concerning elevated WBC with no symptoms, after speaking with patient she confirmed no symptoms but has had recent steroid injection for back issues.  Nurse saw slightly decreased GFR elevated slightly elevated Bun, double checked with Pharm D to confirm okay to give Prolia. Okay to give with GFR above 30 confirmed. Patient requesting urine to make sure no uti will collect UA and culture while in [place] for Prolia injection. Patient has possible UTI low back pain, frequency scheduled Virtual with NP for 10/14/21 and will collect UA today for appt.

## 2021-10-13 NOTE — Progress Notes (Addendum)
Brenda Cardenas presents today for injection per MD orders. Prolia injection administered SQ in left Upper Arm. Administration without incident. Patient tolerated well.  Montie Swiderski,cma

## 2021-10-13 NOTE — Telephone Encounter (Signed)
Please call to discuss labwork. She thinks the labwork she had needs to be re drawn. Please call to discuss. She is supposed to get an injection this afternoon, and would like to know if she should get this injection.

## 2021-10-13 NOTE — Telephone Encounter (Signed)
Patient called wanting to know if she should have her labs repeated. They showed elevated WBC count and she is scheduled to have Prolia shot today. Advised that repeat labs were not needed and that I would reach out to their office. She verbalized understanding and was appreciative for the call back.  Sent secure chat message to PCP office and patient may proceed with her injection.

## 2021-10-14 ENCOUNTER — Ambulatory Visit (INDEPENDENT_AMBULATORY_CARE_PROVIDER_SITE_OTHER): Payer: Medicare Other | Admitting: Family

## 2021-10-14 VITALS — BP 113/63 | HR 53 | Ht 65.0 in

## 2021-10-14 DIAGNOSIS — D72828 Other elevated white blood cell count: Secondary | ICD-10-CM | POA: Diagnosis not present

## 2021-10-14 LAB — URINALYSIS
Bilirubin Urine: NEGATIVE
Hgb urine dipstick: NEGATIVE
Ketones, ur: NEGATIVE
Leukocytes,Ua: NEGATIVE
Nitrite: NEGATIVE
Specific Gravity, Urine: 1.02 (ref 1.000–1.030)
Total Protein, Urine: NEGATIVE
Urine Glucose: NEGATIVE
Urobilinogen, UA: 0.2 (ref 0.0–1.0)
pH: 6 (ref 5.0–8.0)

## 2021-10-14 LAB — URINE CULTURE
MICRO NUMBER:: 12875454
SPECIMEN QUALITY:: ADEQUATE

## 2021-10-14 NOTE — Progress Notes (Signed)
Virtual Visit via Telephone Note  I connected with Brenda Cardenas on 10/14/21 at  3:15 PM EST by telephone and verified that I am speaking with the correct person using two identifiers.  Location: Patient: Home Provider: [place]   I discussed the limitations, risks, security and privacy concerns of performing an evaluation and management service by telephone and the availability of in person appointments. I also discussed with the patient that there may be a patient responsible charge related to this service. The patient expressed understanding and agreed to proceed.   History of Present Illness: 88 year old female presents with concerns of elevated WBC count. She reports having a steroid injection for her back hours prior to having her blood work done. She is concerned that she may have a urinary tract infection. She does not have any symptoms. However, she reports her father dying with an elevated WBC count so she is nervous.     Observations/Objective: A&O, NAD   Assessment and Plan: Jakyiah was seen today for urinary tract infection.  Diagnoses and all orders for this visit:  Other elevated white blood cell (WBC) count  Advised patient that we will send a urine culture to ensure her urine is ok. However, I truly believe her elevated white blood count is related to the Kenalog injection that she received hours prior to having her labs drawn   Follow Up Instructions: Call the office if symptoms worse or persist. Recheck as scheduled as needed or sooner as needed.     I discussed the assessment and treatment plan with the patient. The patient was provided an opportunity to ask questions and all were answered. The patient agreed with the plan and demonstrated an understanding of the instructions.   The patient was advised to call back or seek an in-person evaluation if the symptoms worsen or if the condition fails to improve as anticipated.  I provided 20 minutes of  non-face-to-face time during this encounter.   Kennyth Arnold, FNP

## 2021-10-14 NOTE — Progress Notes (Signed)
Subjective:  Patient ID: Brenda Cardenas, female    DOB: [birth year]  Age: 88 y.o. MRN: [id number]  CC: Back Pain (lower)   Procedure: L5-S1 epidural steroid under fluoroscopic guidance with no sedation  HPI Brenda Cardenas presents for reevaluation.  She continues to have incapacitating low back pain and bilateral hip pain with occasional lower extremity pain.  This pain has been quite recalcitrant and has failed conservative therapy.  She tries to do her stretching strengthening exercises and physical therapy modalities at home without much success.  Historically she has had both caudal and epidural steroid injections with improvement and presents today requesting repeat epidural to see if we can get her pain under better control.  She is having difficulty sleeping difficulty ambulating and nothing really much has helped with her pain relief.  Bowel bladder function has been stable and no other changes are noted today.  Outpatient Medications Prior to Visit  Medication Sig Dispense Refill   atenolol (TENORMIN) 25 MG tablet Take 1 tablet (25 mg total) by mouth 2 (two) times daily. 180 tablet 1   Calcium Citrate-Vitamin D (CITRACAL + D PO) Take 2 capsules by mouth every other day. Alternates with Vitamin D3 and centrum silver     Cholecalciferol (VITAMIN D) 50 MCG (2000 UT) CAPS Take 1 capsule by mouth every other day.     CRANBERRY PO Take 2 capsules by mouth daily.      estradiol (ESTRACE) 0.1 MG/GM vaginal cream estradiol 0.01% (0.1 mg/gram) vaginal cream     ezetimibe (ZETIA) 10 MG tablet TAKE 1 TABLET BY MOUTH DAILY 90 tablet 3   hyoscyamine (ANASPAZ) 0.125 MG TBDP disintergrating tablet Place 1 tablet (0.125 mg total) under the tongue every 6 (six) hours as needed (as needed for esophageal spasm). 30 tablet 0   Multiple Vitamins-Minerals (CENTRUM SILVER 50+WOMEN PO) Take by mouth. Takes 1 tablet every third day. Alternates with citracal and Vitamin D.     omeprazole (PRILOSEC) 20 MG capsule  TAKE 1 CAPSULE BY MOUTH EVERY DAY 90 capsule 1   ondansetron (ZOFRAN ODT) 4 MG disintegrating tablet Take 1 tablet (4 mg total) by mouth every 8 (eight) hours as needed for nausea or vomiting. 20 tablet 0   Probiotic Product (ALIGN PO) Take 1 tablet by mouth daily.      acetaminophen (TYLENOL) 325 MG tablet Take 650 mg by mouth every 6 (six) hours as needed.     Facility-Administered Medications Prior to Visit  Medication Dose Route Frequency Provider Last Rate Last Admin   [COMPLETED] denosumab (PROLIA) injection 60 mg  60 mg Subcutaneous Once Einar Pheasant, MD   60 mg at 10/13/21 0930    Review of Systems CNS: No confusion or sedation Cardiac: No angina or palpitations GI: No abdominal pain or constipation Constitutional: No nausea vomiting fevers or chills  Objective:  BP (!) 158/97    Pulse 62    Temp (!) 97.2 F (36.2 C)    Resp 18    Ht 5\' 5"  (1.651 m)    Wt 134 lb (60.8 kg)    SpO2 100%    BMI 22.30 kg/m    BP Readings from Last 3 Encounters:  10/13/21 113/63  10/10/21 130/68  10/08/21 (!) 158/97     Wt Readings from Last 3 Encounters:  10/10/21 135 lb (61.2 kg)  10/08/21 134 lb (60.8 kg)  09/09/21 135 lb (61.2 kg)     Physical Exam Pt is alert and oriented PERRL EOMI HEART IS  RRR no murmur or rub LCTA no wheezing or rales MUSCULOSKELETAL reveals some paraspinous muscle tenderness in the lumbar region but no overt trigger points.  She has a significant rotary scoliosis present and as per baseline.  Strength appears at baseline.  Labs  Lab Results  Component Value Date   HGBA1C 5.4 01/05/2020   HGBA1C 5.5 06/16/2018   HGBA1C 5.7 03/24/2018   Lab Results  Component Value Date   MICROALBUR 1.0 01/05/2020   LDLCALC 76 07/10/2021   CREATININE 0.95 10/10/2021    -------------------------------------------------------------------------------------------------------------------- Lab Results  Component Value Date   WBC 18.7 (H) 10/10/2021   HGB 11.6  10/10/2021   HCT 35.1 10/10/2021   PLT 198 10/10/2021   GLUCOSE 80 10/10/2021   CHOL 149 07/10/2021   TRIG 136.0 07/10/2021   HDL 46.10 07/10/2021   LDLDIRECT 103.0 06/16/2017   LDLCALC 76 07/10/2021   ALT 12 10/10/2021   AST 21 10/10/2021   NA 138 10/10/2021   K 4.5 10/10/2021   CL 102 10/10/2021   CREATININE 0.95 10/10/2021   BUN 32 (H) 10/10/2021   CO2 23 10/10/2021   TSH 1.730 10/10/2021   HGBA1C 5.4 01/05/2020   MICROALBUR 1.0 01/05/2020    --------------------------------------------------------------------------------------------------------------------- DG PAIN CLINIC C-ARM 1-60 MIN NO REPORT  Result Date: 10/08/2021 Fluoro was used, but no Radiologist interpretation will be provided. Please refer to "NOTES" tab for provider progress note.    Assessment & Plan:   1. Spinal stenosis of lumbar region without neurogenic claudication   2. DDD (degenerative disc disease), lumbar   3. Other idiopathic scoliosis, thoracolumbar region   4. Low back pain radiating to left leg   5. Chronic right-sided low back pain without sciatica   6. Pain of right hip joint   7. Sciatica, left side   8. Acute bilateral low back pain with right-sided sciatica   Based on our discussion today I think it be appropriate to proceed with a repeat epidural as she has responded favorably to these in the past and appears in a significant pain.  As per discussion we will attempt this under a caudal epidural unless we are able to find access via the epidural lumbar route.  She is open to either technique.  I want her to continue efforts at stretching though this is difficult for her.  We will schedule her for a 2 to 84-month return with possible repeat injection at that time.  She is to continue follow-up with her primary care physicians for baseline medical care as  well.       ----------------------------------------------------------------------------------------------------------------------  Problem List Items Addressed This Visit   None     ----------------------------------------------------------------------------------------------------------------------  There are no diagnoses linked to this encounter.   ----------------------------------------------------------------------------------------------------------------------  I am having [name] maintain her acetaminophen, Probiotic Product (ALIGN PO), CRANBERRY PO, hyoscyamine, estradiol, ondansetron, ezetimibe, Vitamin D, Calcium Citrate-Vitamin D (CITRACAL + D PO), Multiple Vitamins-Minerals (CENTRUM SILVER 50+WOMEN PO), atenolol, and omeprazole. We administered triamcinolone acetonide, sodium chloride flush, ropivacaine (PF) 2 mg/mL (0.2%), lidocaine (PF), and iopamidol.   Meds ordered this encounter  Medications   triamcinolone acetonide (KENALOG-40) injection 40 mg   sodium chloride flush (NS) 0.9 % injection 10 mL   ropivacaine (PF) 2 mg/mL (0.2%) (NAROPIN) injection 10 mL   lidocaine (PF) (XYLOCAINE) 1 % injection 5 mL   iopamidol (ISOVUE-M) 41 % intrathecal injection 20 mL   Patient's Medications  New Prescriptions   ATENOLOL (TENORMIN) 25 MG TABLET    Take 0.5  tablets (12.5 mg total) by mouth as needed (As needed for fast heart rates).  Previous Medications   ACETAMINOPHEN (TYLENOL) 325 MG TABLET    Take 650 mg by mouth every 6 (six) hours as needed.   ATENOLOL (TENORMIN) 25 MG TABLET    Take 1 tablet (25 mg total) by mouth 2 (two) times daily.   CALCIUM CITRATE-VITAMIN D (CITRACAL + D PO)    Take 2 capsules by mouth every other day. Alternates with Vitamin D3 and centrum silver   CHOLECALCIFEROL (VITAMIN D) 50 MCG (2000 UT) CAPS    Take 1 capsule by mouth every other day.   CRANBERRY PO    Take 2 capsules by mouth daily.    ESTRADIOL (ESTRACE) 0.1 MG/GM VAGINAL CREAM     estradiol 0.01% (0.1 mg/gram) vaginal cream   EZETIMIBE (ZETIA) 10 MG TABLET    TAKE 1 TABLET BY MOUTH DAILY   HYOSCYAMINE (ANASPAZ) 0.125 MG TBDP DISINTERGRATING TABLET    Place 1 tablet (0.125 mg total) under the tongue every 6 (six) hours as needed (as needed for esophageal spasm).   MULTIPLE VITAMINS-MINERALS (CENTRUM SILVER 50+WOMEN PO)    Take by mouth. Takes 1 tablet every third day. Alternates with citracal and Vitamin D.   OMEPRAZOLE (PRILOSEC) 20 MG CAPSULE    TAKE 1 CAPSULE BY MOUTH EVERY DAY   ONDANSETRON (ZOFRAN ODT) 4 MG DISINTEGRATING TABLET    Take 1 tablet (4 mg total) by mouth every 8 (eight) hours as needed for nausea or vomiting.   PROBIOTIC PRODUCT (ALIGN PO)    Take 1 tablet by mouth daily.   Modified Medications   No medications on file  Discontinued Medications   No medications on file   ----------------------------------------------------------------------------------------------------------------------  Follow-up: Return in about 2 months (around 12/06/2021) for evaluation, med refill.   Procedure: L5-S1 LESI with fluoroscopic guidance and nomoderate sedation  NOTE: The risks, benefits, and expectations of the procedure have been discussed and explained to the patient who was understanding and in agreement with suggested treatment plan. No guarantees were made.  DESCRIPTION OF PROCEDURE: Lumbar epidural steroid injection with 0 mg IV Versed, EKG, blood pressure, pulse, and pulse oximetry monitoring. The procedure was performed with the patient in the prone position under fluoroscopic guidance.  Sterile prep x3 was initiated and I then injected subcutaneous lidocaine to the overlying L5-S1 site after its fluoroscopic identifictation.  Using strict aseptic technique, I then advanced an 18-gauge Tuohy epidural needle in the midline using interlaminar approach via loss-of-resistance to saline technique. There was negative aspiration for heme or  CSF.  I then confirmed  position with both AP and Lateral fluoroscan.  2 cc of contrast dye were injected and a  total of 5 mL of Preservative-Free normal saline mixed with 40 mg of Kenalog and 1cc Ropicaine 0.2 percent were injected incrementally via the  epidurally placed needle. The needle was removed. The patient tolerated the injection well and was convalesced and discharged to home in stable condition. Should the patient have any post procedure difficulty they have been instructed on how to contact us for assistance.    Molli Barrows, MD

## 2021-10-15 ENCOUNTER — Telehealth: Payer: Self-pay | Admitting: Internal Medicine

## 2021-10-15 NOTE — Telephone Encounter (Signed)
Pt called in stating that she had received culture report on mychart. Pt stated that she doesn't understand what the report means. Pt stated that she would like for someone to call her back to explain what the report means. Pt requesting callback on cellphone.

## 2021-10-16 NOTE — Telephone Encounter (Signed)
Patient called back & she had actually had questions about the CBC that was not drawn here in office. Her WBC count was high & was advised by cardiology to make Dr. Derrel Nip aware of this. She stated that after talking to [facility] on Tuesday & the NP at [facility] that they all agreed most likely due to the steroid injection that she received from pain management. Pt advised that she did not need to repeat UA.

## 2021-10-16 NOTE — Telephone Encounter (Signed)
LM at both numbers to call back.

## 2021-10-16 NOTE — Telephone Encounter (Signed)
Judson Roch did you speak with pt about this yesterday when you talked to her? I will call if not.

## 2021-10-27 DIAGNOSIS — M810 Age-related osteoporosis without current pathological fracture: Secondary | ICD-10-CM | POA: Diagnosis not present

## 2021-10-28 NOTE — Progress Notes (Signed)
Electrophysiology Office Note:    Date:  10/29/2021   ID:  Brenda Cardenas, DOB 05-21-1932, MRN [id number]  PCP:  Crecencio Mc, MD  [facility] Cardiologist:  Nelva Bush, MD  [facility] Electrophysiologist:  Vickie Epley, MD   Referring MD: Nelva Bush, MD   Chief Complaint: SVT  History of Present Illness:    Brenda Cardenas is a 88 y.o. female who presents for an evaluation of SVT at the request of Dr. Saunders Revel. Their medical history includes hypertension, hyperlipidemia, GERD.  The patient saw Dr. Saunders Revel August 13, 2021 for palpitations.  She has a history of left anterior fascicular block.  She saw Dr. Saunders Revel after presenting to the emergency department 1 week earlier with possible atrial fibrillation.  This was in the setting of a prednisone prescription for back pain.  She was not started on anticoagulation.  Dr. Saunders Revel interpreted her EKG as supraventricular tachycardia rather than atrial fibrillation.  A 14-day event monitor was recommended.  Event monitor showed episodes of SVT and no episodes of atrial fibrillation.  The patient saw Patrick Jupiter on October 10, 2018 in follow-up.  At that appointment she reported mild symptoms when in SVT.  Sometimes she is completely asymptomatic another time she may feel palpitations.     Past Medical History:  Diagnosis Date   Arthritis    Basal cell carcinoma 06/04/2010   left upper back   Chronic kidney disease    Colon polyps    Gastritis    GERD (gastroesophageal reflux disease)    Hiatal hernia    Hyperlipidemia    Hypertension    IBS (irritable bowel syndrome)    Low back pain radiating to left leg 03/28/2020   Sciatica, left side 04/15/2020   Squamous cell carcinoma of skin 06/26/2013   left mid pretibial   Squamous cell carcinoma of skin 08/21/2015   lower sternum   Squamous cell carcinoma of skin 10/14/2017   right mid pretibial   Squamous cell carcinoma of skin 03/17/2018   right prox lateral elbow   Squamous  cell carcinoma of skin 09/07/2018   right post thigh   Squamous cell carcinoma of skin 05/11/2019   right mid med pretibial   Squamous cell carcinoma of skin 06/20/2019   right bicep    Past Surgical History:  Procedure Laterality Date   APPENDECTOMY  1959   CARPAL TUNNEL RELEASE     CATARACT EXTRACTION W/ INTRAOCULAR LENS IMPLANT Left    CATARACT EXTRACTION W/PHACO Right 04/20/2018   Procedure: CATARACT EXTRACTION PHACO AND INTRAOCULAR LENS PLACEMENT ([place])  RIGHT;  Surgeon: Leandrew Koyanagi, MD;  Location: [place];  Service: Ophthalmology;  Laterality: Right;   CHOLECYSTECTOMY  1995   GANGLION CYST EXCISION     JOINT REPLACEMENT Right July 2009   [place]    KNEE ARTHROSCOPY     LITHOTRIPSY     SALIVARY GLAND SURGERY     SKIN CANCER EXCISION     TONSILLECTOMY     TONSILLECTOMY AND ADENOIDECTOMY     VEIN LIGATION      Current Medications: Current Meds  Medication Sig   acetaminophen (TYLENOL) 325 MG tablet Take 650 mg by mouth every 4 (four) hours as needed.   atenolol (TENORMIN) 25 MG tablet Take 1 tablet (25 mg total) by mouth 2 (two) times daily.   atenolol (TENORMIN) 25 MG tablet Take 0.5 tablets (12.5 mg total) by mouth as needed (As needed for fast heart rates).   Calcium  Citrate-Vitamin D (CITRACAL + D PO) Take 2 capsules by mouth every other day. Alternates with Vitamin D3 and centrum silver   Cholecalciferol (VITAMIN D) 50 MCG (2000 UT) CAPS Take 1 capsule by mouth every other day.   CRANBERRY PO Take 2 capsules by mouth daily.    estradiol (ESTRACE) 0.1 MG/GM vaginal cream estradiol 0.01% (0.1 mg/gram) vaginal cream   ezetimibe (ZETIA) 10 MG tablet TAKE 1 TABLET BY MOUTH DAILY   hyoscyamine (ANASPAZ) 0.125 MG TBDP disintergrating tablet Place 1 tablet (0.125 mg total) under the tongue every 6 (six) hours as needed (as needed for esophageal spasm).   Multiple Vitamins-Minerals (CENTRUM SILVER 50+WOMEN PO) Take by mouth. Takes 1 tablet every third day.  Alternates with citracal and Vitamin D.   omeprazole (PRILOSEC) 20 MG capsule TAKE 1 CAPSULE BY MOUTH EVERY DAY   ondansetron (ZOFRAN ODT) 4 MG disintegrating tablet Take 1 tablet (4 mg total) by mouth every 8 (eight) hours as needed for nausea or vomiting.   Probiotic Product (ALIGN PO) Take 1 tablet by mouth daily.      Allergies:   Ciprofloxacin, Clindamycin/lincomycin, Sulfa antibiotics, Augmentin [amoxicillin-pot clavulanate], Naproxen sodium, Prednisone, Tramadol, Codeine, Doxycycline hyclate, E-mycin [erythromycin], Macrobid [nitrofurantoin], Nabumetone, Nitrofurantoin monohyd macro, Nsaids, Phenobarbital, and Statins   Social History   Socioeconomic History   Marital status: Single    Spouse name: Not on file   Number of children: Not on file   Years of education: Not on file   Highest education level: Not on file  Occupational History   Not on file  Tobacco Use   Smoking status: Never   Smokeless tobacco: Never  Vaping Use   Vaping Use: Never used  Substance and Sexual Activity   Alcohol use: No    Alcohol/week: 0.0 standard drinks   Drug use: No   Sexual activity: Never  Other Topics Concern   Not on file  Social History Narrative   Not on file   Social Determinants of Health   Financial Resource Strain: Low Risk    Difficulty of Paying Living Expenses: Not hard at all  Food Insecurity: No Food Insecurity   Worried About Charity fundraiser in the Last Year: Never true   Ran Out of Food in the Last Year: Never true  Transportation Needs: No Transportation Needs   Lack of Transportation (Medical): No   Lack of Transportation (Non-Medical): No  Physical Activity: Insufficiently Active   Days of Exercise per Week: 3 days   Minutes of Exercise per Session: 30 min  Stress: No Stress Concern Present   Feeling of Stress : Not at all  Social Connections: Unknown   Frequency of Communication with Friends and Family: More than three times a week   Frequency of Social  Gatherings with Friends and Family: Not on file   Attends Religious Services: Not on file   Active Member of Clubs or Organizations: Not on file   Attends Archivist Meetings: Not on file   Marital Status: Not on file     Family History: The patient's family history includes Arthritis in her mother; Diabetes in her father; Heart disease in her mother.  ROS:   Please see the history of present illness.    All other systems reviewed and are negative.  EKGs/Labs/Other Studies Reviewed:    The following studies were reviewed today:  September 08, 2021 0 11 episodes of SVT, longest episode lasting 4 minutes 58 seconds with a heart rate of  184 bpm.  Possible AVNRT versus A. tach.      Recent Labs: 10/10/2021: ALT 12; BUN 32; Creatinine, Ser 0.95; Hemoglobin 11.6; Magnesium 2.2; Platelets 198; Potassium 4.5; Sodium 138; TSH 1.730  Recent Lipid Panel    Component Value Date/Time   CHOL 149 07/10/2021 0804   TRIG 136.0 07/10/2021 0804   HDL 46.10 07/10/2021 0804   CHOLHDL 3 07/10/2021 0804   VLDL 27.2 07/10/2021 0804   LDLCALC 76 07/10/2021 0804   LDLDIRECT 103.0 06/16/2017 0834    Physical Exam:    VS:  BP (!) 150/70 (BP Location: Left Arm, Patient Position: Sitting, Cuff Size: Normal)    Pulse (!) 56    Ht 5\' 5"  (1.651 m)    Wt 135 lb (61.2 kg) Comment: per patient   SpO2 97%    BMI 22.47 kg/m     Wt Readings from Last 3 Encounters:  10/29/21 135 lb (61.2 kg)  10/10/21 135 lb (61.2 kg)  10/08/21 134 lb (60.8 kg)     GEN:  Well nourished, well developed in no acute distress.  Appears younger than stated age 15: Normal NECK: No JVD; No carotid bruits LYMPHATICS: No lymphadenopathy CARDIAC: RRR, no murmurs, rubs, gallops RESPIRATORY:  Clear to auscultation without rales, wheezing or rhonchi  ABDOMEN: Soft, non-tender, non-distended MUSCULOSKELETAL:  No edema; No deformity.  Ankle brace on left side. SKIN: Warm and dry NEUROLOGIC:  Alert and oriented x  3 PSYCHIATRIC:  Normal affect       ASSESSMENT:    1. SVT (supraventricular tachycardia) ([place])   2. Essential hypertension    PLAN:    In order of problems listed above:  #SVT Review of heart monitor and history suggests AVNRT versus less likely atrial tachycardia.  I would like to treat this with beta-blockers as first-line.  Given her age, atenolol is not the best agent.  We will discontinue this today.  We will start metoprolol succinate 25 mg by mouth twice daily.  She will check her blood pressures at home 1-2 times per week.  I would like her to be seen by Dr. Derrel Nip in clinic in 4 to 6 weeks to review blood pressure trend and adjust antihypertensive regimen as needed.  We discussed using the Valsalva maneuver to terminate episodes of SVT during today's clinic appointment.  We will provide some information about the Valsalva maneuver today.  If breakthrough episodes of SVT continue and they are symptomatic, we could consider starting a low-dose amiodarone without a load (amiodarone 200 mg by mouth once daily).  #Hypertension Systolic 563 today which is likely the upper end of her goal blood pressure range.  She will monitor her pressures closely as she discontinues atenolol and follow-up with Dr. Derrel Nip for further adjustments in her antihypertensive regimen.    Medication Adjustments/Labs and Tests Ordered: Current medicines are reviewed at length with the patient today.  Concerns regarding medicines are outlined above.  No orders of the defined types were placed in this encounter.  No orders of the defined types were placed in this encounter.    Signed, Hilton Cork. Quentin Ore, MD, [facility], [facility] 10/29/2021 9:35 AM    Electrophysiology [facility]

## 2021-10-29 ENCOUNTER — Ambulatory Visit (INDEPENDENT_AMBULATORY_CARE_PROVIDER_SITE_OTHER): Payer: Medicare Other | Admitting: Cardiology

## 2021-10-29 ENCOUNTER — Other Ambulatory Visit: Payer: Self-pay

## 2021-10-29 ENCOUNTER — Encounter: Payer: Self-pay | Admitting: Cardiology

## 2021-10-29 VITALS — BP 150/70 | HR 56 | Ht 65.0 in | Wt 135.0 lb

## 2021-10-29 DIAGNOSIS — I10 Essential (primary) hypertension: Secondary | ICD-10-CM

## 2021-10-29 DIAGNOSIS — I471 Supraventricular tachycardia: Secondary | ICD-10-CM

## 2021-10-29 MED ORDER — METOPROLOL SUCCINATE ER 25 MG PO TB24: 25.0000 mg | ORAL_TABLET | Freq: Two times a day (BID) | ORAL | 3 refills | Status: DC

## 2021-10-29 NOTE — Patient Instructions (Addendum)
Medications: Stop Atenolol  Start Metoprolol Succinate 25 mg two times daily Your physician recommends that you continue on your current medications as directed. Please refer to the Current Medication list given to you today. *If you need a refill on your cardiac medications before your next appointment, please call your pharmacy*  Lab Work: None. If you have labs (blood work) drawn today and your tests are completely normal, you will receive your results only by: [place] (if you have MyChart) OR A paper copy in the mail If you have any lab test that is abnormal or we need to change your treatment, we will call you to review the results.  Testing/Procedures: None.  Follow-Up: At [facility], you and your health needs are our priority.  As part of our continuing mission to provide you with exceptional heart care, we have created designated Provider Care Teams.  These Care Teams include your primary Cardiologist (physician) and Advanced Practice Providers (APPs -  Physician Assistants and Nurse Practitioners) who all work together to provide you with the care you need, when you need it.  Your physician wants you to follow-up in: Call Dr. Lupita Dawn office to move up appointment for 4 weeks to check on blood pressure control.   We recommend signing up for the patient portal called "MyChart".  Sign up information is provided on this After Visit Summary.  MyChart is used to connect with patients for Virtual Visits (Telemedicine).  Patients are able to view lab/test results, encounter notes, upcoming appointments, etc.  Non-urgent messages can be sent to your provider as well.   To learn more about what you can do with MyChart, go to [email or web address].    Any Other Special Instructions Will Be Listed Below (If Applicable).   The Valsalva maneuver is a breathing method that may slow your heart when it's beating too fast. To do it, you breathe out strongly through your mouth while  holding your nose tightly closed. This creates a forceful strain that can trigger your heart to react and go back into normal rhythm.

## 2021-11-26 ENCOUNTER — Other Ambulatory Visit: Payer: Self-pay

## 2021-11-26 ENCOUNTER — Ambulatory Visit (INDEPENDENT_AMBULATORY_CARE_PROVIDER_SITE_OTHER): Payer: Medicare Other | Admitting: Internal Medicine

## 2021-11-26 ENCOUNTER — Encounter: Payer: Self-pay | Admitting: Internal Medicine

## 2021-11-26 VITALS — BP 130/78 | HR 70 | Temp 97.6°F | Ht 65.0 in | Wt 136.0 lb

## 2021-11-26 DIAGNOSIS — I700 Atherosclerosis of aorta: Secondary | ICD-10-CM

## 2021-11-26 DIAGNOSIS — I471 Supraventricular tachycardia: Secondary | ICD-10-CM

## 2021-11-26 DIAGNOSIS — I10 Essential (primary) hypertension: Secondary | ICD-10-CM | POA: Diagnosis not present

## 2021-11-26 DIAGNOSIS — E785 Hyperlipidemia, unspecified: Secondary | ICD-10-CM | POA: Diagnosis not present

## 2021-11-26 DIAGNOSIS — D72829 Elevated white blood cell count, unspecified: Secondary | ICD-10-CM

## 2021-11-26 DIAGNOSIS — E538 Deficiency of other specified B group vitamins: Secondary | ICD-10-CM

## 2021-11-26 DIAGNOSIS — M5432 Sciatica, left side: Secondary | ICD-10-CM

## 2021-11-26 DIAGNOSIS — D7589 Other specified diseases of blood and blood-forming organs: Secondary | ICD-10-CM

## 2021-11-26 DIAGNOSIS — Z8349 Family history of other endocrine, nutritional and metabolic diseases: Secondary | ICD-10-CM

## 2021-11-26 LAB — CBC WITH DIFFERENTIAL/PLATELET
Basophils Absolute: 0.2 10*3/uL — ABNORMAL HIGH (ref 0.0–0.1)
Basophils Relative: 1.5 % (ref 0.0–3.0)
Eosinophils Absolute: 0.4 10*3/uL (ref 0.0–0.7)
Eosinophils Relative: 4.2 % (ref 0.0–5.0)
HCT: 36.9 % (ref 36.0–46.0)
Hemoglobin: 12 g/dL (ref 12.0–15.0)
Lymphocytes Relative: 22.8 % (ref 12.0–46.0)
Lymphs Abs: 2.3 10*3/uL (ref 0.7–4.0)
MCHC: 32.6 g/dL (ref 30.0–36.0)
MCV: 100.9 fl — ABNORMAL HIGH (ref 78.0–100.0)
Monocytes Absolute: 0.5 10*3/uL (ref 0.1–1.0)
Monocytes Relative: 4.6 % (ref 3.0–12.0)
Neutro Abs: 6.8 10*3/uL (ref 1.4–7.7)
Neutrophils Relative %: 66.9 % (ref 43.0–77.0)
Platelets: 171 10*3/uL (ref 150.0–400.0)
RBC: 3.66 Mil/uL — ABNORMAL LOW (ref 3.87–5.11)
RDW: 22 % — ABNORMAL HIGH (ref 11.5–15.5)
WBC: 10.2 10*3/uL (ref 4.0–10.5)

## 2021-11-26 LAB — COMPREHENSIVE METABOLIC PANEL
ALT: 12 U/L (ref 0–35)
AST: 18 U/L (ref 0–37)
Albumin: 4 g/dL (ref 3.5–5.2)
Alkaline Phosphatase: 72 U/L (ref 39–117)
BUN: 24 mg/dL — ABNORMAL HIGH (ref 6–23)
CO2: 27 mEq/L (ref 19–32)
Calcium: 8.8 mg/dL (ref 8.4–10.5)
Chloride: 105 mEq/L (ref 96–112)
Creatinine, Ser: 0.74 mg/dL (ref 0.40–1.20)
GFR: 72.47 mL/min (ref >60.00)
Glucose, Bld: 90 mg/dL (ref 70–99)
Potassium: 4.4 mEq/L (ref 3.5–5.1)
Sodium: 138 mEq/L (ref 135–145)
Total Bilirubin: 0.7 mg/dL (ref 0.2–1.2)
Total Protein: 6.2 g/dL (ref 6.0–8.3)

## 2021-11-26 NOTE — Patient Instructions (Signed)
You are doing well! ? ?Your blood pressure is fine . Continue the metoprolol twice daily ? ?Return in 6 months   ?

## 2021-11-26 NOTE — Assessment & Plan Note (Signed)
Resolved after October 07 2021   ESI  ?

## 2021-11-26 NOTE — Progress Notes (Addendum)
? ?Subjective:  ?Patient ID: Brenda Cardenas, female    DOB: [birth year]  Age: 88 y.o. MRN: [id number] ? ?CC: The primary encounter diagnosis was Leukocytosis, unspecified type. Diagnoses of Essential hypertension, Hyperlipidemia LDL goal <130, Aortic atherosclerosis ([place]), Supraventricular tachycardia ([place]), and Sciatica, left side were also pertinent to this visit. ? ? ?This visit occurred during the SARS-CoV-2 public health emergency.  Safety protocols were in place, including screening questions prior to the visit, additional usage of staff PPE, and extensive cleaning of exam room while observing appropriate contact time as indicated for disinfecting solutions.   ? ?HPI ?Brenda Cardenas presents for  follow up on SVT.  ?Chief Complaint  ?Patient presents with  ? Follow-up  ?  Follow up from cardiology appointment  ? ?1) SVT:  treated in ED in Nov for SVT vs A fib w RVR.  Her  Atenolol has now been changed to metoprolol 25 mg bid .  The episode occurred in the setting of recent steroid injection.  Was - referred to cardiology,  saw End, sent to [facility] .  holter monitor was worn for 14 days:  September 08, 2021 :   ?11 episodes of SVT, longest episode lasting 4 minutes 58 seconds with a heart rate of 184 bpm.  Possible AVNRT versus A. tach. ?She wonders if it is brought on by vertigo .   Dr End thinks her anxiety may be playing  ? ?She has been checking BP/pulse 3 times daily since Feb 5.  1 episode, resolved with 1/2 tablet of metoprolol (12.5 mg)  ? ?2) HTN:  bp was 150/70 on Feb 1 at cardiology visit.  Home readings have been checked tid for the past month  and VIRTUALLY  all are < 140/90 ? ?3) Had an ESI in January by Andree Elk which resolved her sciatica. Still has morning back pain relieved wth activity .  She wanted to review her orthopedic issues.  Left foot and ankle pain  ? ?Outpatient Medications Prior to Visit  ?Medication Sig Dispense Refill  ? acetaminophen (TYLENOL) 325 MG tablet Take 650 mg by mouth every  4 (four) hours as needed.    ? Calcium Citrate-Vitamin D (CITRACAL + D PO) Take 2 capsules by mouth every other day. Alternates with Vitamin D3 and centrum silver    ? Cholecalciferol (VITAMIN D) 50 MCG (2000 UT) CAPS Take 1 capsule by mouth every other day.    ? CRANBERRY PO Take 2 capsules by mouth daily.     ? estradiol (ESTRACE) 0.1 MG/GM vaginal cream estradiol 0.01% (0.1 mg/gram) vaginal cream    ? ezetimibe (ZETIA) 10 MG tablet TAKE 1 TABLET BY MOUTH DAILY 90 tablet 3  ? hyoscyamine (ANASPAZ) 0.125 MG TBDP disintergrating tablet Place 1 tablet (0.125 mg total) under the tongue every 6 (six) hours as needed (as needed for esophageal spasm). 30 tablet 0  ? metoprolol succinate (TOPROL XL) 25 MG 24 hr tablet Take 1 tablet (25 mg total) by mouth in the morning and at bedtime. Can take 12.5 mg extra daily as needed for breakthrough SVT 180 tablet 3  ? Multiple Vitamins-Minerals (CENTRUM SILVER 50+WOMEN PO) Take by mouth. Takes 1 tablet every third day. Alternates with citracal and Vitamin D.    ? omeprazole (PRILOSEC) 20 MG capsule TAKE 1 CAPSULE BY MOUTH EVERY DAY 90 capsule 1  ? ondansetron (ZOFRAN ODT) 4 MG disintegrating tablet Take 1 tablet (4 mg total) by mouth every 8 (eight) hours as needed for nausea or  vomiting. 20 tablet 0  ? Probiotic Product (ALIGN PO) Take 1 tablet by mouth daily.     ? ?No facility-administered medications prior to visit.  ? ? ?Review of Systems; ? ?Patient denies headache, fevers, malaise, unintentional weight loss, skin rash, eye pain, sinus congestion and sinus pain, sore throat, dysphagia,  hemoptysis , cough, dyspnea, wheezing, chest pain, palpitations, orthopnea, edema, abdominal pain, nausea, melena, diarrhea, constipation, flank pain, dysuria, hematuria, urinary  Frequency, nocturia, numbness, tingling, seizures,  Focal weakness, Loss of consciousness,  Tremor, insomnia, depression, anxiety, and suicidal ideation.   ? ? ? ?Objective:  ?BP 130/78 (BP Location: Left Arm,  Patient Position: Sitting, Cuff Size: Normal)   Pulse 70   Temp 97.6 ?F (36.4 ?C) (Oral)   Ht 5\' 5"  (1.651 m)   Wt 136 lb (61.7 kg)   SpO2 100%   BMI 22.63 kg/m?  ? ?BP Readings from Last 3 Encounters:  ?11/26/21 130/78  ?10/29/21 (!) 150/70  ?10/13/21 113/63  ? ? ?Wt Readings from Last 3 Encounters:  ?11/26/21 136 lb (61.7 kg)  ?10/29/21 135 lb (61.2 kg)  ?10/10/21 135 lb (61.2 kg)  ? ? ?General appearance: alert, cooperative and appears stated age ?Ears: normal TM's and external ear canals both ears ?Throat: lips, mucosa, and tongue normal; teeth and gums normal ?Neck: no adenopathy, no carotid bruit, supple, symmetrical, trachea midline and thyroid not enlarged, symmetric, no tenderness/mass/nodules ?Back: symmetric, no curvature. ROM normal. No CVA tenderness. ?Lungs: clear to auscultation bilaterally ?Heart: regular rate and rhythm, S1, S2 normal, no murmur, click, rub or gallop ?Abdomen: soft, non-tender; bowel sounds normal; no masses,  no organomegaly ?Pulses: 2+ and symmetric ?Skin: Skin color, texture, turgor normal. No rashes or lesions ?Lymph nodes: Cervical, supraclavicular, and axillary nodes normal. ? ?Lab Results  ?Component Value Date  ? HGBA1C 5.4 01/05/2020  ? HGBA1C 5.5 06/16/2018  ? HGBA1C 5.7 03/24/2018  ? ? ?Lab Results  ?Component Value Date  ? CREATININE 0.95 10/10/2021  ? CREATININE 0.95 08/09/2021  ? CREATININE 0.71 07/10/2021  ? ? ?Lab Results  ?Component Value Date  ? WBC 18.7 (H) 10/10/2021  ? HGB 11.6 10/10/2021  ? HCT 35.1 10/10/2021  ? PLT 198 10/10/2021  ? GLUCOSE 80 10/10/2021  ? CHOL 149 07/10/2021  ? TRIG 136.0 07/10/2021  ? HDL 46.10 07/10/2021  ? LDLDIRECT 103.0 06/16/2017  ? [place] 76 07/10/2021  ? ALT 12 10/10/2021  ? AST 21 10/10/2021  ? NA 138 10/10/2021  ? K 4.5 10/10/2021  ? CL 102 10/10/2021  ? CREATININE 0.95 10/10/2021  ? BUN 32 (H) 10/10/2021  ? CO2 23 10/10/2021  ? TSH 1.730 10/10/2021  ? HGBA1C 5.4 01/05/2020  ? MICROALBUR 1.0 01/05/2020  ? ? ?[place]  C-ARM 1-60 MIN NO REPORT ? ?Result Date: 10/08/2021 ?Fluoro was used, but no Radiologist interpretation will be provided. Please refer to "NOTES" tab for provider progress note. ? ? ?Assessment & Plan:  ? ?Problem List Items Addressed This Visit   ? ? Aortic atherosclerosis ([place])  ?  Reviewed prior  Chest CT  with patient. Continue Zetia.  She is statin intolerant due to development of rash and elevated liver enzymes ?  ?Lab Results  ?Component Value Date  ? CHOL 149 07/10/2021  ? HDL 46.10 07/10/2021  ? [place] 76 07/10/2021  ? LDLDIRECT 103.0 06/16/2017  ? TRIG 136.0 07/10/2021  ? CHOLHDL 3 07/10/2021  ? ? ?  ?  ? Essential hypertension  ?  Well  controlled on current regimen. Renal function stable, no changes today. ?  ?  ? Relevant Orders  ? Comprehensive metabolic panel  ? Comprehensive metabolic panel  ? Hyperlipidemia LDL goal <130  ? Relevant Orders  ? Lipid panel  ? TSH  ? Sciatica, left side  ?  Resolved after October 07 2021   ESI  ?  ?  ? Supraventricular tachycardia ([place])  ?  Managed with 12.5 mg metoprolol prn  Continue metoprolol 25 mg bid.  ?  ?  ? ?Other Visit Diagnoses   ? ? Leukocytosis, unspecified type    -  Primary  ? Relevant Orders  ? CBC with Differential/Platelet  ? CBC with Differential/Platelet  ? ?  ? ? ?I spent 24 minutes dedicated to the care of this patient on the date of this encounter to include pre-visit review of patient's medical history,  most recent imaging studies, Face-to-face time with the patient , and post visit ordering of testing and therapeutics.   ? ?Follow-up: No follow-ups on file. ? ? ?Crecencio Mc, MD ?

## 2021-11-26 NOTE — Assessment & Plan Note (Signed)
Managed with 12.5 mg metoprolol prn  Continue metoprolol 25 mg bid.  ?

## 2021-11-26 NOTE — Assessment & Plan Note (Signed)
Reviewed prior  Chest CT  with patient. Continue Zetia.  She is statin intolerant due to development of rash and elevated liver enzymes ?  ?Lab Results  ?Component Value Date  ? CHOL 149 07/10/2021  ? HDL 46.10 07/10/2021  ? [place] 76 07/10/2021  ? LDLDIRECT 103.0 06/16/2017  ? TRIG 136.0 07/10/2021  ? CHOLHDL 3 07/10/2021  ? ? ?

## 2021-11-26 NOTE — Assessment & Plan Note (Signed)
Well controlled on current regimen. Renal function stable, no changes today. 

## 2021-11-27 NOTE — Addendum Note (Signed)
Addended by: Crecencio Mc on: 11/27/2021 09:41 PM ? ? Modules accepted: Orders ? ?

## 2021-12-01 ENCOUNTER — Ambulatory Visit: Payer: Medicare Other | Attending: Anesthesiology | Admitting: Anesthesiology

## 2021-12-01 ENCOUNTER — Encounter: Payer: Self-pay | Admitting: Anesthesiology

## 2021-12-01 ENCOUNTER — Other Ambulatory Visit: Payer: Self-pay

## 2021-12-01 VITALS — BP 154/68 | HR 66 | Temp 97.5°F | Resp 16

## 2021-12-01 DIAGNOSIS — M4125 Other idiopathic scoliosis, thoracolumbar region: Secondary | ICD-10-CM | POA: Diagnosis present

## 2021-12-01 DIAGNOSIS — G8929 Other chronic pain: Secondary | ICD-10-CM | POA: Insufficient documentation

## 2021-12-01 DIAGNOSIS — M79605 Pain in left leg: Secondary | ICD-10-CM | POA: Insufficient documentation

## 2021-12-01 DIAGNOSIS — M25551 Pain in right hip: Secondary | ICD-10-CM

## 2021-12-01 DIAGNOSIS — M545 Low back pain, unspecified: Secondary | ICD-10-CM

## 2021-12-01 DIAGNOSIS — M48061 Spinal stenosis, lumbar region without neurogenic claudication: Secondary | ICD-10-CM | POA: Diagnosis present

## 2021-12-01 DIAGNOSIS — M5136 Other intervertebral disc degeneration, lumbar region: Secondary | ICD-10-CM | POA: Diagnosis present

## 2021-12-01 DIAGNOSIS — M51369 Other intervertebral disc degeneration, lumbar region without mention of lumbar back pain or lower extremity pain: Secondary | ICD-10-CM

## 2021-12-01 NOTE — Progress Notes (Signed)
Safety precautions to be maintained throughout the outpatient stay will include: orient to surroundings, keep bed in low position, maintain call bell within reach at all times, provide assistance with transfer out of bed and ambulation.  

## 2021-12-01 NOTE — Patient Instructions (Signed)
______________________________________________________________________ ° °Preparing for your procedure (without sedation) ° °Procedure appointments are limited to planned procedures: °No Prescription Refills. °No disability issues will be discussed. °No medication changes will be discussed. ° °Instructions: °Oral Intake: Do not eat or drink anything for at least 6 hours prior to your procedure. (Exception: Blood Pressure Medication. See below.) °Transportation: Unless otherwise stated by your physician, you may drive yourself after the procedure. °Blood Pressure Medicine: Do not forget to take your blood pressure medicine with a sip of water the morning of the procedure. If your Diastolic (lower reading)is above 100 mmHg, elective cases will be cancelled/rescheduled. °Blood thinners: These will need to be stopped for procedures. Notify our staff if you are taking any blood thinners. Depending on which one you take, there will be specific instructions on how and when to stop it. °Diabetics on insulin: Notify the staff so that you can be scheduled 1st case in the morning. If your diabetes requires high dose insulin, take only ½ of your normal insulin dose the morning of the procedure and notify the staff that you have done so. °Preventing infections: Shower with an antibacterial soap the morning of your procedure.  °Build-up your immune system: Take 1000 mg of Vitamin C with every meal (3 times a day) the day prior to your procedure. °Antibiotics: Inform the staff if you have a condition or reason that requires you to take antibiotics before dental procedures. °Pregnancy: If you are pregnant, call and cancel the procedure. °Sickness: If you have a cold, fever, or any active infections, call and cancel the procedure. °Arrival: You must be in the facility at least 30 minutes prior to your scheduled procedure. °Children: Do not bring any children with you. °Dress appropriately: Bring dark clothing that you would not mind  if they get stained. °Valuables: Do not bring any jewelry or valuables. ° °Reasons to call and reschedule or cancel your procedure: (Following these recommendations will minimize the risk of a serious complication.) °Surgeries: Avoid having procedures within 2 weeks of any surgery. (Avoid for 2 weeks before or after any surgery). °Flu Shots: Avoid having procedures within 2 weeks of a flu shots or . (Avoid for 2 weeks before or after immunizations). °Barium: Avoid having a procedure within 7-10 days after having had a radiological study involving the use of radiological contrast. (Myelograms, Barium swallow or enema study). °Heart attacks: Avoid any elective procedures or surgeries for the initial 6 months after a "Myocardial Infarction" (Heart Attack). °Blood thinners: It is imperative that you stop these medications before procedures. Let us know if you if you take any blood thinner.  °Infection: Avoid procedures during or within two weeks of an infection (including chest colds or gastrointestinal problems). Symptoms associated with infections include: Localized redness, fever, chills, night sweats or profuse sweating, burning sensation when voiding, cough, congestion, stuffiness, runny nose, sore throat, diarrhea, nausea, vomiting, cold or Flu symptoms, recent or current infections. It is specially important if the infection is over the area that we intend to treat. °Heart and lung problems: Symptoms that may suggest an active cardiopulmonary problem include: cough, chest pain, breathing difficulties or shortness of breath, dizziness, ankle swelling, uncontrolled high or unusually low blood pressure, and/or palpitations. If you are experiencing any of these symptoms, cancel your procedure and contact your primary care physician for an evaluation. ° °Remember:  °Regular Business hours are:  °Monday to Thursday 8:00 AM to 4:00 PM ° °Provider's Schedule: °Francisco Naveira, MD:  °Procedure days: Tuesday and Thursday    7:30 AM to 4:00 PM  Gillis Santa, MD:  Procedure days: Monday and Wednesday 7:30 AM to 4:00 PM ______________________________________________________________________  Epidural Steroid Injection An epidural steroid injection is a shot of steroid medicine and numbing medicine that is given into the space between the spinal cord and the bones of the back (epidural space). The shot helps relieve pain caused by an irritated or swollen nerve root. The amount of pain relief you get from the injection depends on what is causing the nerve to be swollen and irritated, and how long your pain lasts. You are more likely to benefit from this injection if your pain is strong and comes on suddenly rather than if you have had long-term (chronic) pain. Tell a health care provider about: Any allergies you have. All medicines you are taking, including vitamins, herbs, eye drops, creams, and over-the-counter medicines. Any problems you or family members have had with anesthetic medicines. Any blood disorders you have. Any surgeries you have had. Any medical conditions you have. Whether you are pregnant or may be pregnant. What are the risks? Generally, this is a safe procedure. However, problems may occur, including: Headache. Bleeding. Infection. Allergic reaction to medicines. Nerve damage. What happens before the procedure? Staying hydrated Follow instructions from your health care provider about hydration, which may include: Up to 2 hours before the procedure - you may continue to drink clear liquids, such as water, clear fruit juice, black coffee, and plain tea. Eating and drinking restrictions Follow instructions from your health care provider about eating and drinking, which may include: 8 hours before the procedure - stop eating heavy meals or foods, such as meat, fried foods, or fatty foods. 6 hours before the procedure - stop eating light meals or foods, such as toast or cereal. 6 hours before  the procedure - stop drinking milk or drinks that contain milk. 2 hours before the procedure - stop drinking clear liquids. Medicines You may be given medicines to lower anxiety. Ask your health care provider about: Changing or stopping your regular medicines. This is especially important if you are taking diabetes medicines or blood thinners. Taking medicines such as aspirin and ibuprofen. These medicines can thin your blood. Do not take these medicines unless your health care provider tells you to take them. Taking over-the-counter medicines, vitamins, herbs, and supplements. General instructions Ask your health care provider what steps will be taken to prevent infection. Plan to have a responsible adult take you home from the hospital or clinic. If you will be going home right after the procedure, plan to have a responsible adult care for you for the time you are told. This is important. What happens during the procedure? An IV will be inserted into one of your veins. You will be given one or more of the following: A medicine to help you relax (sedative). A medicine to numb the area (local anesthetic). You will be asked to lie on your abdomen or sit. The injection site will be cleaned. A needle will be inserted through your skin into the epidural space. This may cause you some discomfort. An X-ray machine will be used to guide the needle as close as possible to the affected nerve. A steroid medicine and a local anesthetic will be injected into the epidural space. The needle and IV will be removed. A bandage (dressing) will be put over the injection site. The procedure may vary among health care providers and hospitals. What can I expect after the procedure? Your blood  pressure, heart rate, breathing rate, and blood oxygen level will be monitored until you leave the hospital or clinic. Your arm or leg may feel weak or numb for a few hours. The injection site may feel sore. Follow these  instructions at home: Injection site care You may remove the bandage (dressing) after 24 hours. Check your injection site every day for signs of infection. Check for: Redness, swelling, or pain. Fluid or blood. Warmth. Pus or a bad smell. Managing pain, stiffness, and swelling For 24 hours after the procedure: Avoid using heat on the injection site. Do not take baths, swim, or use a hot tub until your health care provider approves. Ask your health care provider if you may take showers. You may only be allowed to take sponge baths. If directed, put ice on the injection site. To do this: Put ice in a plastic bag. Place a towel between your skin and the bag. Leave the ice on for 20 minutes, 2-3 times a day.  Activity If you were given a sedative during the procedure, it can affect you for several hours. Do not drive or operate machinery until your health care provider says that it is safe. Return to your normal activities as told by your health care provider. Ask your health care provider what activities are safe for you. General instructions Take over-the-counter and prescription medicines only as told by your health care provider. Drink enough fluid to keep your urine pale yellow. Keep all follow-up visits as told by your health care provider. This is important. Contact a health care provider if: You have any of these signs of infection: Redness, swelling, or pain around your injection site. Fluid or blood coming from your injection site. Warmth coming from your injection site. Pus or a bad smell coming from your injection site. A fever. You continue to have pain and soreness around the injection site, even after taking over-the-counter pain medicine. You have severe, sudden, or lasting nausea or vomiting. Get help right away if: You have severe pain at the injection site that is not relieved by medicines. You develop a severe headache or a stiff neck. You become sensitive to  light. You have any new numbness or weakness in your legs or arms. You lose control of your bladder or bowel movements. You have trouble breathing. Summary An epidural steroid injection is a shot of steroid medicine and numbing medicine that is given into the epidural space. The shot helps relieve pain caused by an irritated or swollen nerve root. You are more likely to benefit from this injection if your pain is strong and comes on suddenly rather than if you have had chronic pain. This information is not intended to replace advice given to you by your health care provider. Make sure you discuss any questions you have with your health care provider. Document Revised: 01/12/2020 Document Reviewed: 03/27/2019 Elsevier Patient Education  [place].

## 2021-12-01 NOTE — Progress Notes (Signed)
Subjective:  Patient ID: Brenda Cardenas, female    DOB: 13-Jul-1932  Age: 88 y.o. MRN: [id number]  CC: Neck Pain (Around waist)   Procedure: None  HPI Brenda Cardenas presents for reevaluation.  She was last seen back in early January and had an L5-S1 epidural that was quite effective for her lower extremity pain.  She is no longer having any significant lower calf pain or severe left lateral leg pain.  She still has spasms of the low back and this is worse with prolonged standing but generally better tolerated.  She is sleeping better at night and able to be more active.  She still walks approximately 1 mile on her track with a walker every other day.  No change in lower extremity strength or function or bowel or bladder function is noted at this time.  She seems to be doing well with taking her Tylenol 2 tablets approximately 3 times a day.  She is not requiring any other medications at this time and is quite pleased with her current progress.  Outpatient Medications Prior to Visit  Medication Sig Dispense Refill   Calcium Citrate-Vitamin D (CITRACAL + D PO) Take 2 capsules by mouth every other day. Alternates with Vitamin D3 and centrum silver     Cholecalciferol (VITAMIN D) 50 MCG (2000 UT) CAPS Take 1 capsule by mouth every other day.     CRANBERRY PO Take 2 capsules by mouth daily.      estradiol (ESTRACE) 0.1 MG/GM vaginal cream estradiol 0.01% (0.1 mg/gram) vaginal cream     ezetimibe (ZETIA) 10 MG tablet TAKE 1 TABLET BY MOUTH DAILY 90 tablet 3   hyoscyamine (ANASPAZ) 0.125 MG TBDP disintergrating tablet Place 1 tablet (0.125 mg total) under the tongue every 6 (six) hours as needed (as needed for esophageal spasm). 30 tablet 0   metoprolol succinate (TOPROL XL) 25 MG 24 hr tablet Take 1 tablet (25 mg total) by mouth in the morning and at bedtime. Can take 12.5 mg extra daily as needed for breakthrough SVT 180 tablet 3   Multiple Vitamins-Minerals (CENTRUM SILVER 50+WOMEN PO) Take by mouth.  Takes 1 tablet every third day. Alternates with citracal and Vitamin D.     omeprazole (PRILOSEC) 20 MG capsule TAKE 1 CAPSULE BY MOUTH EVERY DAY 90 capsule 1   ondansetron (ZOFRAN ODT) 4 MG disintegrating tablet Take 1 tablet (4 mg total) by mouth every 8 (eight) hours as needed for nausea or vomiting. 20 tablet 0   Probiotic Product (ALIGN PO) Take 1 tablet by mouth daily.      acetaminophen (TYLENOL) 325 MG tablet Take 650 mg by mouth every 4 (four) hours as needed.     No facility-administered medications prior to visit.    Review of Systems CNS: No confusion or sedation Cardiac: No angina or palpitations GI: No abdominal pain or constipation Constitutional: No nausea vomiting fevers or chills  Objective:  BP (!) 154/68    Pulse 66    Temp (!) 97.5 F (36.4 C)    Resp 16    SpO2 100%    BP Readings from Last 3 Encounters:  12/01/21 (!) 154/68  11/26/21 130/78  10/29/21 (!) 150/70     Wt Readings from Last 3 Encounters:  11/26/21 136 lb (61.7 kg)  10/29/21 135 lb (61.2 kg)  10/10/21 135 lb (61.2 kg)     Physical Exam Pt is alert and oriented PERRL EOMI HEART IS RRR no murmur or rub LCTA no wheezing  or rales MUSCULOSKELETAL reveals some paraspinous muscle tenderness in the lumbar region.  She ambulates with a severely antalgic gait.  She uses her walker for assistance.  Labs  Lab Results  Component Value Date   HGBA1C 5.4 01/05/2020   HGBA1C 5.5 06/16/2018   HGBA1C 5.7 03/24/2018   Lab Results  Component Value Date   MICROALBUR 1.0 01/05/2020   LDLCALC 76 07/10/2021   CREATININE 0.74 11/26/2021    -------------------------------------------------------------------------------------------------------------------- Lab Results  Component Value Date   WBC 10.2 11/26/2021   HGB 12.0 11/26/2021   HCT 36.9 11/26/2021   PLT 171.0 11/26/2021   GLUCOSE 90 11/26/2021   CHOL 149 07/10/2021   TRIG 136.0 07/10/2021   HDL 46.10 07/10/2021   LDLDIRECT 103.0  06/16/2017   LDLCALC 76 07/10/2021   ALT 12 11/26/2021   AST 18 11/26/2021   NA 138 11/26/2021   K 4.4 11/26/2021   CL 105 11/26/2021   CREATININE 0.74 11/26/2021   BUN 24 (H) 11/26/2021   CO2 27 11/26/2021   TSH 1.730 10/10/2021   HGBA1C 5.4 01/05/2020   MICROALBUR 1.0 01/05/2020    --------------------------------------------------------------------------------------------------------------------- DG PAIN CLINIC C-ARM 1-60 MIN NO REPORT  Result Date: 10/08/2021 Fluoro was used, but no Radiologist interpretation will be provided. Please refer to "NOTES" tab for provider progress note.    Assessment & Plan:   Iliany was seen today for neck pain.  Diagnoses and all orders for this visit:  Spinal stenosis of lumbar region without neurogenic claudication -     Lumbar Epidural Injection; Future  DDD (degenerative disc disease), lumbar -     Lumbar Epidural Injection; Future  Other idiopathic scoliosis, thoracolumbar region  Low back pain radiating to left leg -     Lumbar Epidural Injection; Future  Chronic right-sided low back pain without sciatica  Pain of right hip joint        ----------------------------------------------------------------------------------------------------------------------  Problem List Items Addressed This Visit       Unprioritized   Idiopathic scoliosis   Low back pain radiating to left leg   Relevant Orders   Lumbar Epidural Injection   Spinal stenosis of lumbar region - Primary   Relevant Orders   Lumbar Epidural Injection   Other Visit Diagnoses     DDD (degenerative disc disease), lumbar       Relevant Orders   Lumbar Epidural Injection   Chronic right-sided low back pain without sciatica       Pain of right hip joint             ----------------------------------------------------------------------------------------------------------------------  1. Spinal stenosis of lumbar region without neurogenic  claudication We will defer on any repeat injections at this time and schedule her for a 79-monthreturn to clinic for possible epidural at that time.  Want her to continue efforts at stretching strengthening as tolerated. - Lumbar Epidural Injection; Future  2. DDD (degenerative disc disease), lumbar As above - Lumbar Epidural Injection; Future  3. Other idiopathic scoliosis, thoracolumbar region As above  4. Low back pain radiating to left leg  - Lumbar Epidural Injection; Future  5. Chronic right-sided low back pain without sciatica   6. Pain of right hip joint As above and continue follow-up with her primary care physician for baseline medical care and support.    ----------------------------------------------------------------------------------------------------------------------  I am having EIva Boopmaintain her acetaminophen, Probiotic Product (ALIGN PO), CRANBERRY PO, hyoscyamine, estradiol, ondansetron, ezetimibe, Vitamin D, Calcium Citrate-Vitamin D (CITRACAL + D PO), Multiple Vitamins-Minerals (CENTRUM SILVER  50+WOMEN PO), omeprazole, and metoprolol succinate.   No orders of the defined types were placed in this encounter.  Patient's Medications  New Prescriptions   No medications on file  Previous Medications   ACETAMINOPHEN (TYLENOL) 325 MG TABLET    Take 650 mg by mouth every 4 (four) hours as needed.   CALCIUM CITRATE-VITAMIN D (CITRACAL + D PO)    Take 2 capsules by mouth every other day. Alternates with Vitamin D3 and centrum silver   CHOLECALCIFEROL (VITAMIN D) 50 MCG (2000 UT) CAPS    Take 1 capsule by mouth every other day.   CRANBERRY PO    Take 2 capsules by mouth daily.    ESTRADIOL (ESTRACE) 0.1 MG/GM VAGINAL CREAM    estradiol 0.01% (0.1 mg/gram) vaginal cream   EZETIMIBE (ZETIA) 10 MG TABLET    TAKE 1 TABLET BY MOUTH DAILY   HYOSCYAMINE (ANASPAZ) 0.125 MG TBDP DISINTERGRATING TABLET    Place 1 tablet (0.125 mg total) under the tongue every 6 (six)  hours as needed (as needed for esophageal spasm).   METOPROLOL SUCCINATE (TOPROL XL) 25 MG 24 HR TABLET    Take 1 tablet (25 mg total) by mouth in the morning and at bedtime. Can take 12.5 mg extra daily as needed for breakthrough SVT   MULTIPLE VITAMINS-MINERALS (CENTRUM SILVER 50+WOMEN PO)    Take by mouth. Takes 1 tablet every third day. Alternates with citracal and Vitamin D.   OMEPRAZOLE (PRILOSEC) 20 MG CAPSULE    TAKE 1 CAPSULE BY MOUTH EVERY DAY   ONDANSETRON (ZOFRAN ODT) 4 MG DISINTEGRATING TABLET    Take 1 tablet (4 mg total) by mouth every 8 (eight) hours as needed for nausea or vomiting.   PROBIOTIC PRODUCT (ALIGN PO)    Take 1 tablet by mouth daily.   Modified Medications   No medications on file  Discontinued Medications   No medications on file   ----------------------------------------------------------------------------------------------------------------------  Follow-up: Return in about 2 months (around 01/31/2022) for evaluation, med refill.    Molli Barrows, MD

## 2022-01-09 ENCOUNTER — Other Ambulatory Visit: Payer: Medicare Other

## 2022-01-10 ENCOUNTER — Other Ambulatory Visit: Payer: Self-pay | Admitting: Internal Medicine

## 2022-01-12 ENCOUNTER — Encounter: Payer: Self-pay | Admitting: Internal Medicine

## 2022-01-12 NOTE — Telephone Encounter (Signed)
Pt called back and stated that she does not need a refill on medication ?

## 2022-01-12 NOTE — Telephone Encounter (Signed)
LMTCB TO CLARIFY WHO HAD BEEN FILLING AND HOW PATIENT TAKES.  ?

## 2022-01-13 ENCOUNTER — Other Ambulatory Visit: Payer: Self-pay

## 2022-01-13 ENCOUNTER — Encounter: Payer: Self-pay | Admitting: Internal Medicine

## 2022-01-13 MED ORDER — EZETIMIBE 10 MG PO TABS: 10.0000 mg | ORAL_TABLET | Freq: Every day | ORAL | 3 refills | Status: DC

## 2022-01-13 NOTE — Telephone Encounter (Signed)
I spoke with patient this morning & she stated that she did not know why estradiol refill was sent to [place] but that would refer to her urologist for this. Issue is resolved & patient also picked up her Zetia prescription.  ?

## 2022-01-14 ENCOUNTER — Ambulatory Visit (INDEPENDENT_AMBULATORY_CARE_PROVIDER_SITE_OTHER): Payer: Medicare Other | Admitting: Internal Medicine

## 2022-01-14 ENCOUNTER — Encounter: Payer: Self-pay | Admitting: Internal Medicine

## 2022-01-14 VITALS — BP 130/70 | HR 62 | Ht 66.0 in | Wt 137.0 lb

## 2022-01-14 DIAGNOSIS — I471 Supraventricular tachycardia: Secondary | ICD-10-CM | POA: Diagnosis not present

## 2022-01-14 DIAGNOSIS — I10 Essential (primary) hypertension: Secondary | ICD-10-CM

## 2022-01-14 MED ORDER — METOPROLOL SUCCINATE ER 25 MG PO TB24: 25.0000 mg | ORAL_TABLET | Freq: Two times a day (BID) | ORAL | 2 refills | Status: DC

## 2022-01-14 NOTE — Telephone Encounter (Signed)
Noted  

## 2022-01-14 NOTE — Patient Instructions (Signed)
Medication Instructions:  ? ?Your physician recommends that you continue on your current medications as directed. Please refer to the Current Medication list given to you today. ? ?*If you need a refill on your cardiac medications before your next appointment, please call your pharmacy* ? ? ?Lab Work: ? ?None ordered ? ?Testing/Procedures: ? ?None ordered ? ? ?Follow-Up: ?At [facility], you and your health needs are our priority.  As part of our continuing mission to provide you with exceptional heart care, we have created designated Provider Care Teams.  These Care Teams include your primary Cardiologist (physician) and Advanced Practice Providers (APPs -  Physician Assistants and Nurse Practitioners) who all work together to provide you with the care you need, when you need it. ? ?We recommend signing up for the patient portal called "MyChart".  Sign up information is provided on this After Visit Summary.  MyChart is used to connect with patients for Virtual Visits (Telemedicine).  Patients are able to view lab/test results, encounter notes, upcoming appointments, etc.  Non-urgent messages can be sent to your provider as well.   ?To learn more about what you can do with MyChart, go to [email or web address].   ? ?Your next appointment:   ?6 month(s) ? ?The format for your next appointment:   ?In Person ? ?Provider:   ?You may see Nelva Bush, MD or one of the following Advanced Practice Providers on your designated Care Team:   ?Murray Hodgkins, NP ?Christell Faith, PA-C ?Cadence Kathlen Mody, PA-C}  ? ? ?Other Instructions ? ?Please drink more water ? ?Important Information About Sugar ? ? ? ? ? ? ?

## 2022-01-14 NOTE — Progress Notes (Signed)
? ?Follow-up Outpatient Visit ?Date: 01/14/2022 ? ?Primary Care Provider: ?Crecencio Mc, MD ?[address] ?[place] [address] ? ?Chief Complaint: Lightheadedness ? ?HPI:  Brenda Cardenas is a 88 y.o. female with history of supraventricular tachycardia, hypertension, hyperlipidemia, GERD, IBS, arthritis, and low back pain, who presents for follow-up of supraventricular tachycardia.  I last saw her in 07/2021 for evaluation of possible atrial fibrillation.  ED EKG was more consistent with SVT.  Subsequent event monitor showed multiple episodes of PSVT lasting up to 5 minutes.  She was subsequently seen by Dr. Quentin Ore, who transitioned from atenolol to metoprolol.  Valsalva maneuvers were reinforced.  It was recommended that she if she had continued symptomatic SVT, that amiodarone would be the next option. ? ?Today, Brenda Cardenas reports that she has been feeling fairly well.  She still has occasional palpitations though they have improved with metoprolol use.  The episodes typically abate with vagal maneuvers though she carries an extra half tablet of metoprolol succinate with her to help with breakthrough symptoms.  She denies chest pain, shortness of breath, and syncope.  She has chronic orthostatic lightheadedness, which is fairly stable since switching from atenolol to metoprolol.  She admits that she does not drink as much fluid as she should out of concerns about nocturia.  The lightheadedness typically lasts only a few seconds and can be avoided if she sits/stands up slowly. ? ?-------------------------------------------------------------------------------------------------- ? ?Cardiovascular History & Procedures: ?Cardiovascular Problems: ?Supraventricular tachycardia ?  ?Risk Factors: ?Hypertension, hyperlipidemia, and age > 57 ?  ?Cath/PCI: ?None ?  ?CV Surgery: ?None ?  ?EP Procedures and Devices: ?14-day event monitor (08/13/2021): Predominantly sinus rhythm with rare PACs and PVCs.  11  episodes of PSVT lasting up to 4 minutes, 58 seconds noted with maximum rate 184 bpm. ?  ?Non-Invasive Evaluation(s): ?TTE (08/20/2020): Normal LV size with moderate asymmetric hypertrophy of the basal septum.  LVEF 60-65% with normal wall motion and grade 2 diastolic dysfunction with elevated filling pressure.  Mild right atrial enlargement with normal contraction.  Normal biatrial size.  Trivial mitral regurgitation.  Aortic sclerosis with trivial regurgitation.  Normal CVP and PA pressure. ? ?Recent CV Pertinent Labs: ?Lab Results  ?Component Value Date  ? CHOL 149 07/10/2021  ? HDL 46.10 07/10/2021  ? [place] 76 07/10/2021  ? LDLDIRECT 103.0 06/16/2017  ? TRIG 136.0 07/10/2021  ? CHOLHDL 3 07/10/2021  ? K 4.4 11/26/2021  ? MG 2.2 10/10/2021  ? BUN 24 (H) 11/26/2021  ? BUN 32 (H) 10/10/2021  ? CREATININE 0.74 11/26/2021  ? ? ?Past medical and surgical history were reviewed and updated in EPIC. ? ?Current Meds  ?Medication Sig  ? acetaminophen (TYLENOL) 325 MG tablet Take 650 mg by mouth every 6 (six) hours as needed.  ? Calcium Citrate-Vitamin D (CITRACAL + D PO) Take 2 capsules by mouth every other day. Alternates with Vitamin D3 and centrum silver  ? Cholecalciferol (VITAMIN D) 50 MCG (2000 UT) CAPS Take 1 capsule by mouth every other day.  ? CRANBERRY PO Take 2 capsules by mouth daily.   ? estradiol (ESTRACE) 0.1 MG/GM vaginal cream estradiol 0.01% (0.1 mg/gram) vaginal cream  ? ezetimibe (ZETIA) 10 MG tablet Take 1 tablet (10 mg total) by mouth daily.  ? hyoscyamine (ANASPAZ) 0.125 MG TBDP disintergrating tablet Place 1 tablet (0.125 mg total) under the tongue every 6 (six) hours as needed (as needed for esophageal spasm).  ? metoprolol succinate (TOPROL XL) 25 MG 24 hr tablet  Take 1 tablet (25 mg total) by mouth in the morning and at bedtime. Can take 12.5 mg extra daily as needed for breakthrough SVT  ? Multiple Vitamins-Minerals (CENTRUM SILVER 50+WOMEN PO) Take by mouth. Takes 1 tablet every third day.  Alternates with citracal and Vitamin D.  ? omeprazole (PRILOSEC) 20 MG capsule TAKE 1 CAPSULE BY MOUTH EVERY DAY  ? ondansetron (ZOFRAN ODT) 4 MG disintegrating tablet Take 1 tablet (4 mg total) by mouth every 8 (eight) hours as needed for nausea or vomiting.  ? Probiotic Product (ALIGN PO) Take 1 tablet by mouth daily.   ? [DISCONTINUED] acetaminophen (TYLENOL) 325 MG tablet Take 650 mg by mouth every 4 (four) hours as needed.  ? ? ?Allergies: Ciprofloxacin, Clindamycin/lincomycin, Sulfa antibiotics, Augmentin [amoxicillin-pot clavulanate], Gabapentin, Naproxen sodium, Prednisone, Tramadol, Codeine, Doxycycline hyclate, E-mycin [erythromycin], Macrobid [nitrofurantoin], Nabumetone, Nitrofurantoin monohyd macro, Nsaids, Phenobarbital, and Statins ? ?Social History  ? ?Tobacco Use  ? Smoking status: Never  ? Smokeless tobacco: Never  ?Vaping Use  ? Vaping Use: Never used  ?Substance Use Topics  ? Alcohol use: No  ?  Alcohol/week: 0.0 standard drinks  ? Drug use: No  ? ? ?Family History  ?Problem Relation Age of Onset  ? Heart disease Mother   ? Arthritis Mother   ? Diabetes Father   ? ? ?Review of Systems: ?A 12-system review of systems was performed and was negative except as noted in the HPI. ? ?-------------------------------------------------------------------------------------------------- ? ?Physical Exam: ?BP 130/70 (BP Location: Left Arm, Patient Position: Sitting, Cuff Size: Normal)   Pulse 62   Ht '5\' 6"'$  (1.676 m)   Wt 137 lb (62.1 kg)   SpO2 95%   BMI 22.11 kg/m?  ?Position Blood pressure (mmHg) Heart rate (bpm)  ?Lying 130/70 61  ?Sitting 130/70 113  ?Standing 110/80 58  ?Standing (3 minutes) 128/80 68  ? ? ?General:  NAD. ?Neck: No JVD or HJR. ?Lungs: Clear to auscultation bilaterally without wheezes or crackles. ?Heart: Regular rate and rhythm without murmurs, rubs, or gallops. ?Abdomen: Soft, nontender, nondistended. ?Extremities: Trace left calf edema with compression stockings in place.  Left  ankle brace also present. ? ?EKG: Normal sinus rhythm with left axis deviation and low voltage.  No significant change from prior tracing on 08/13/2021. ? ?Lab Results  ?Component Value Date  ? WBC 10.2 11/26/2021  ? HGB 12.0 11/26/2021  ? HCT 36.9 11/26/2021  ? MCV 100.9 (H) 11/26/2021  ? PLT 171.0 11/26/2021  ? ? ?Lab Results  ?Component Value Date  ? NA 138 11/26/2021  ? K 4.4 11/26/2021  ? CL 105 11/26/2021  ? CO2 27 11/26/2021  ? BUN 24 (H) 11/26/2021  ? CREATININE 0.74 11/26/2021  ? GLUCOSE 90 11/26/2021  ? ALT 12 11/26/2021  ? ? ?Lab Results  ?Component Value Date  ? CHOL 149 07/10/2021  ? HDL 46.10 07/10/2021  ? [place] 76 07/10/2021  ? LDLDIRECT 103.0 06/16/2017  ? TRIG 136.0 07/10/2021  ? CHOLHDL 3 07/10/2021  ? ? ?-------------------------------------------------------------------------------------------------- ? ?ASSESSMENT AND PLAN: ?Supraventricular tachycardia: ?Ms. Papadopoulos continues to have paroxysmal palpitations, though the episodes are typically short-lived and resolved with vagal maneuvers.  We will continue metoprolol succinate 25 mg daily; an additional 12.5 mg can be taken as needed for breakthrough symptoms.  If symptoms were to worsen, we would consider initiation of amiodarone per Dr. Mardene Speak consultation. ? ?Orthostatic lightheadedness: ?Modest blood pressure drop noted today from sitting to standing.  I suspect an element of intravascular volume depletion  is contributing to this.  It is possible that metoprolol is also playing a role and that Ms. Widger may have some autonomic dysfunction or underlying this.  I have encouraged her to increase her fluid intake in order to stay well-hydrated.  If symptoms persist, we may need to consider de-escalation of metoprolol. ? ?Follow-up: Return to clinic in 6 months. ? ?Nelva Bush, MD ?01/14/2022 ?10:19 AM ? ?

## 2022-01-16 ENCOUNTER — Ambulatory Visit: Payer: Medicare Other

## 2022-01-16 ENCOUNTER — Ambulatory Visit: Payer: Medicare Other | Admitting: Internal Medicine

## 2022-01-20 ENCOUNTER — Ambulatory Visit: Payer: Medicare Other

## 2022-02-02 ENCOUNTER — Other Ambulatory Visit: Payer: Self-pay | Admitting: Anesthesiology

## 2022-02-02 ENCOUNTER — Encounter: Payer: Self-pay | Admitting: Anesthesiology

## 2022-02-02 ENCOUNTER — Ambulatory Visit
Admission: RE | Admit: 2022-02-02 | Discharge: 2022-02-02 | Disposition: A | Discharge status: Home / Self Care | Payer: Medicare Other | Source: Ambulatory Visit | Attending: Anesthesiology | Admitting: Anesthesiology

## 2022-02-02 ENCOUNTER — Ambulatory Visit (HOSPITAL_BASED_OUTPATIENT_CLINIC_OR_DEPARTMENT_OTHER): Payer: Medicare Other | Admitting: Anesthesiology

## 2022-02-02 VITALS — BP 124/80 | HR 72 | Temp 97.7°F | Resp 18 | Ht 66.0 in | Wt 137.0 lb

## 2022-02-02 DIAGNOSIS — M545 Low back pain, unspecified: Secondary | ICD-10-CM | POA: Diagnosis present

## 2022-02-02 DIAGNOSIS — M79605 Pain in left leg: Secondary | ICD-10-CM | POA: Insufficient documentation

## 2022-02-02 DIAGNOSIS — R52 Pain, unspecified: Secondary | ICD-10-CM

## 2022-02-02 DIAGNOSIS — G8929 Other chronic pain: Secondary | ICD-10-CM | POA: Diagnosis present

## 2022-02-02 DIAGNOSIS — M25551 Pain in right hip: Secondary | ICD-10-CM | POA: Diagnosis present

## 2022-02-02 DIAGNOSIS — M48061 Spinal stenosis, lumbar region without neurogenic claudication: Secondary | ICD-10-CM | POA: Insufficient documentation

## 2022-02-02 DIAGNOSIS — M5432 Sciatica, left side: Secondary | ICD-10-CM

## 2022-02-02 DIAGNOSIS — M5136 Other intervertebral disc degeneration, lumbar region: Secondary | ICD-10-CM | POA: Insufficient documentation

## 2022-02-02 DIAGNOSIS — M4125 Other idiopathic scoliosis, thoracolumbar region: Secondary | ICD-10-CM | POA: Insufficient documentation

## 2022-02-02 MED ORDER — TRIAMCINOLONE ACETONIDE 40 MG/ML IJ SUSP
40.0000 mg | Freq: Once | INTRAMUSCULAR | Status: AC
  Administered 2022-02-02: 40 mg
  Filled 2022-02-02: qty 1

## 2022-02-02 MED ORDER — SODIUM CHLORIDE 0.9% FLUSH
10.0000 mL | Freq: Once | INTRAVENOUS | Status: AC
  Administered 2022-02-02: 10 mL

## 2022-02-02 MED ORDER — IOHEXOL 180 MG/ML  SOLN
10.0000 mL | Freq: Once | INTRAMUSCULAR | Status: AC | PRN
  Administered 2022-02-02: 5 mL via EPIDURAL
  Filled 2022-02-02: qty 20

## 2022-02-02 MED ORDER — LIDOCAINE HCL (PF) 1 % IJ SOLN
5.0000 mL | Freq: Once | INTRAMUSCULAR | Status: AC
  Administered 2022-02-02: 5 mL via SUBCUTANEOUS
  Filled 2022-02-02: qty 5

## 2022-02-02 MED ORDER — ROPIVACAINE HCL 2 MG/ML IJ SOLN
10.0000 mL | Freq: Once | INTRAMUSCULAR | Status: AC
  Administered 2022-02-02: 10 mL via EPIDURAL
  Filled 2022-02-02: qty 20

## 2022-02-02 NOTE — Patient Instructions (Signed)

## 2022-02-02 NOTE — Progress Notes (Signed)
Safety precautions to be maintained throughout the outpatient stay will include: orient to surroundings, keep bed in low position, maintain call bell within reach at all times, provide assistance with transfer out of bed and ambulation.  

## 2022-02-03 ENCOUNTER — Encounter: Payer: Self-pay | Admitting: Anesthesiology

## 2022-02-03 ENCOUNTER — Telehealth: Payer: Self-pay | Admitting: *Deleted

## 2022-02-03 NOTE — Telephone Encounter (Signed)
Post procedure call;  patient reports that she is doing well.  

## 2022-02-03 NOTE — Progress Notes (Signed)
? ?Subjective:  ?Patient ID: Brenda Cardenas, female    DOB: 02/18/1932  Age: 88 y.o. MRN: [id number] ? ?CC: Back Pain ? ? ?Procedure: L5-S1 epidural steroid and fluoroscopic guidance with no sedation ? ?HPI ?Brenda Cardenas presents for reevaluation.  Brenda Cardenas has continued to have recurrent low back pain for a considerable period of time and she gets a right hip and right lateral lower leg pain.  She has had previous epidural steroids most recently back in January that give her significant improvement in her symptoms lasting about 2 months before she has recurrence of her baseline pain.  No changes in lower extremity strength or function or bowel or bladder function is noted at this time.  Despite efforts at conservative management with physical therapy home remedy and medication management she continues to have breakthrough pain.  She presents today for an epidural steroid injection. ? ?Outpatient Medications Prior to Visit  ?Medication Sig Dispense Refill  ? acetaminophen (TYLENOL) 325 MG tablet Take 650 mg by mouth every 6 (six) hours as needed.    ? Calcium Citrate-Vitamin D (CITRACAL + D PO) Take 2 capsules by mouth every other day. Alternates with Vitamin D3 and centrum silver    ? Cholecalciferol (VITAMIN D) 50 MCG (2000 UT) CAPS Take 1 capsule by mouth every other day.    ? CRANBERRY PO Take 2 capsules by mouth daily.     ? estradiol (ESTRACE) 0.1 MG/GM vaginal cream estradiol 0.01% (0.1 mg/gram) vaginal cream    ? ezetimibe (ZETIA) 10 MG tablet Take 1 tablet (10 mg total) by mouth daily. 90 tablet 3  ? hyoscyamine (ANASPAZ) 0.125 MG TBDP disintergrating tablet Place 1 tablet (0.125 mg total) under the tongue every 6 (six) hours as needed (as needed for esophageal spasm). 30 tablet 0  ? metoprolol succinate (TOPROL XL) 25 MG 24 hr tablet Take 1 tablet (25 mg total) by mouth in the morning and at bedtime. Can take 12.5 mg extra daily as needed for breakthrough SVT 180 tablet 2  ? Multiple Vitamins-Minerals  (CENTRUM SILVER 50+WOMEN PO) Take by mouth. Takes 1 tablet every third day. Alternates with citracal and Vitamin D.    ? omeprazole (PRILOSEC) 20 MG capsule TAKE 1 CAPSULE BY MOUTH EVERY DAY 90 capsule 1  ? ondansetron (ZOFRAN ODT) 4 MG disintegrating tablet Take 1 tablet (4 mg total) by mouth every 8 (eight) hours as needed for nausea or vomiting. 20 tablet 0  ? Probiotic Product (ALIGN PO) Take 1 tablet by mouth daily.     ? ?No facility-administered medications prior to visit.  ? ? ?Review of Systems ?CNS: No confusion or sedation ?Cardiac: No angina or palpitations ?GI: No abdominal pain or constipation ?Constitutional: No nausea vomiting fevers or chills ? ?Objective:  ?BP 124/80   Pulse 72   Temp 97.7 ?F (36.5 ?C) (Temporal)   Resp 18   Ht '5\' 6"'$  (1.676 m)   Wt 137 lb (62.1 kg)   SpO2 100%   BMI 22.11 kg/m?  ? ? ?BP Readings from Last 3 Encounters:  ?02/02/22 124/80  ?01/14/22 130/70  ?12/01/21 (!) 154/68  ? ? ? ?Wt Readings from Last 3 Encounters:  ?02/02/22 137 lb (62.1 kg)  ?01/14/22 137 lb (62.1 kg)  ?11/26/21 136 lb (61.7 kg)  ? ? ? ?Physical Exam ?Pt is alert and oriented ?PERRL EOMI ?HEART IS RRR no murmur or rub ?LCTA no wheezing or rales ?MUSCULOSKELETAL bilateral paraspinous muscle tenderness in the lumbar region is noted.  She  has a severely antalgic gait and severe kyphoscoliosis affecting the lumbar region.  Muscle tone and bulk is at baseline.  She uses a cane for assistance in ambulation. ? ?Labs ? ?Lab Results  ?Component Value Date  ? HGBA1C 5.4 01/05/2020  ? HGBA1C 5.5 06/16/2018  ? HGBA1C 5.7 03/24/2018  ? ?Lab Results  ?Component Value Date  ? MICROALBUR 1.0 01/05/2020  ? [place] 76 07/10/2021  ? CREATININE 0.74 11/26/2021  ? ? ?-------------------------------------------------------------------------------------------------------------------- ?Lab Results  ?Component Value Date  ? WBC 10.2 11/26/2021  ? HGB 12.0 11/26/2021  ? HCT 36.9 11/26/2021  ? PLT 171.0 11/26/2021  ? GLUCOSE 90  11/26/2021  ? CHOL 149 07/10/2021  ? TRIG 136.0 07/10/2021  ? HDL 46.10 07/10/2021  ? LDLDIRECT 103.0 06/16/2017  ? [place] 76 07/10/2021  ? ALT 12 11/26/2021  ? AST 18 11/26/2021  ? NA 138 11/26/2021  ? K 4.4 11/26/2021  ? CL 105 11/26/2021  ? CREATININE 0.74 11/26/2021  ? BUN 24 (H) 11/26/2021  ? CO2 27 11/26/2021  ? TSH 1.730 10/10/2021  ? HGBA1C 5.4 01/05/2020  ? MICROALBUR 1.0 01/05/2020  ? ? ?--------------------------------------------------------------------------------------------------------------------- ?DG PAIN CLINIC C-ARM 1-60 MIN NO REPORT ? ?Result Date: 02/02/2022 ?Fluoro was used, but no Radiologist interpretation will be provided. Please refer to "NOTES" tab for provider progress note. ? ? ? ?Assessment & Plan:  ? ?Brenda Cardenas was seen today for back pain. ? ?Diagnoses and all orders for this visit: ? ?Spinal stenosis of lumbar region without neurogenic claudication ? ?DDD (degenerative disc disease), lumbar ? ?Other idiopathic scoliosis, thoracolumbar region ? ?Low back pain radiating to left leg ? ?Chronic right-sided low back pain without sciatica ? ?Pain of right hip joint ? ?Sciatica, left side ? ?Other orders ?-     triamcinolone acetonide (KENALOG-40) injection 40 mg ?-     sodium chloride flush (NS) 0.9 % injection 10 mL ?-     ropivacaine (PF) 2 mg/mL (0.2%) (NAROPIN) injection 10 mL ?-     lidocaine (PF) (XYLOCAINE) 1 % injection 5 mL ?-     iohexol (OMNIPAQUE) 180 MG/ML injection 10 mL ? ? ?   ? ? ?---------------------------------------------------------------------------------------------------------------------- ? ?Problem List Items Addressed This Visit   ? ?  ? Unprioritized  ? Idiopathic scoliosis  ? Low back pain radiating to left leg  ? Sciatica, left side  ? Spinal stenosis of lumbar region - Primary  ? ?Other Visit Diagnoses   ? ? DDD (degenerative disc disease), lumbar      ? Relevant Medications  ? triamcinolone acetonide (KENALOG-40) injection 40 mg (Completed)  ? Chronic  right-sided low back pain without sciatica      ? Relevant Medications  ? triamcinolone acetonide (KENALOG-40) injection 40 mg (Completed)  ? ropivacaine (PF) 2 mg/mL (0.2%) (NAROPIN) injection 10 mL (Completed)  ? lidocaine (PF) (XYLOCAINE) 1 % injection 5 mL (Completed)  ? Pain of right hip joint      ? ?  ? ? ? ? ?---------------------------------------------------------------------------------------------------------------------- ? ?1. Spinal stenosis of lumbar region without neurogenic claudication ?We will proceed with a repeat epidural today.  Of gone over the risks and benefits with her in full detail all questions were answered.  Continue her efforts at stretching strengthening as tolerated.  She is doing a great job being ambulatory and trying to walk several laps around her track at her assisted living facility using her cane. ? ?2. DDD (degenerative disc disease), lumbar ?As above ? ?3. Other idiopathic scoliosis, thoracolumbar  region ?As above ? ?4. Low back pain radiating to left leg ? ? ?5. Chronic right-sided low back pain without sciatica ?Epidural today.  Return to clinic in 3 months ? ?6. Pain of right hip joint ? ? ?7. Sciatica, left side ? ? ? ? ?---------------------------------------------------------------------------------------------------------------------- ? ?I am having [name] maintain her Probiotic Product (ALIGN PO), CRANBERRY PO, hyoscyamine, estradiol, ondansetron, Vitamin D, Calcium Citrate-Vitamin D (CITRACAL + D PO), Multiple Vitamins-Minerals (CENTRUM SILVER 50+WOMEN PO), omeprazole, ezetimibe, acetaminophen, and metoprolol succinate. We administered triamcinolone acetonide, sodium chloride flush, ropivacaine (PF) 2 mg/mL (0.2%), lidocaine (PF), and iohexol. ? ? ?Meds ordered this encounter  ?Medications  ? triamcinolone acetonide (KENALOG-40) injection 40 mg  ? sodium chloride flush (NS) 0.9 % injection 10 mL  ? ropivacaine (PF) 2 mg/mL (0.2%) (NAROPIN) injection 10 mL  ?  lidocaine (PF) (XYLOCAINE) 1 % injection 5 mL  ? iohexol (OMNIPAQUE) 180 MG/ML injection 10 mL  ? ?Patient's Medications  ?New Prescriptions  ? No medications on file  ?Previous Medications  ? ACETAMINOPHEN (TYLENOL) 325

## 2022-02-19 ENCOUNTER — Other Ambulatory Visit: Payer: Self-pay | Admitting: Internal Medicine

## 2022-02-26 ENCOUNTER — Telehealth: Payer: Self-pay | Admitting: *Deleted

## 2022-02-26 NOTE — Telephone Encounter (Signed)
Prol[employer] benefits complete. Pt has 2 insurances  $0 due, no PA required.  Okay to schedule Prolia on or after 04/13/22.

## 2022-03-03 NOTE — Telephone Encounter (Signed)
Appt scheduled on 04/15/22

## 2022-03-04 ENCOUNTER — Other Ambulatory Visit: Payer: Medicare Other

## 2022-03-05 ENCOUNTER — Other Ambulatory Visit (INDEPENDENT_AMBULATORY_CARE_PROVIDER_SITE_OTHER): Payer: Medicare Other

## 2022-03-05 DIAGNOSIS — N343 Urethral syndrome, unspecified: Secondary | ICD-10-CM

## 2022-03-06 LAB — URINALYSIS, ROUTINE W REFLEX MICROSCOPIC
Bilirubin Urine: NEGATIVE
Hgb urine dipstick: NEGATIVE
Ketones, ur: NEGATIVE
Leukocytes,Ua: NEGATIVE
Nitrite: NEGATIVE
Specific Gravity, Urine: 1.015 (ref 1.000–1.030)
Total Protein, Urine: NEGATIVE
Urine Glucose: NEGATIVE
Urobilinogen, UA: 0.2 (ref 0.0–1.0)
pH: 6 (ref 5.0–8.0)

## 2022-03-06 LAB — URINE CULTURE
MICRO NUMBER:: 13500927
SPECIMEN QUALITY:: ADEQUATE

## 2022-03-07 ENCOUNTER — Encounter: Payer: Self-pay | Admitting: Internal Medicine

## 2022-03-09 ENCOUNTER — Ambulatory Visit (INDEPENDENT_AMBULATORY_CARE_PROVIDER_SITE_OTHER): Payer: Medicare Other | Admitting: Internal Medicine

## 2022-03-09 ENCOUNTER — Telehealth: Payer: Self-pay | Admitting: Internal Medicine

## 2022-03-09 ENCOUNTER — Encounter: Payer: Self-pay | Admitting: Internal Medicine

## 2022-03-09 VITALS — Ht 66.0 in | Wt 135.0 lb

## 2022-03-09 DIAGNOSIS — R339 Retention of urine, unspecified: Secondary | ICD-10-CM | POA: Diagnosis not present

## 2022-03-09 DIAGNOSIS — R935 Abnormal findings on diagnostic imaging of other abdominal regions, including retroperitoneum: Secondary | ICD-10-CM

## 2022-03-09 NOTE — Telephone Encounter (Signed)
Lft pt vm to call ofc . thanks 

## 2022-03-09 NOTE — Progress Notes (Signed)
Telephone  Note  This visit type was conducted due to national recommendations for restrictions regarding the COVID-19 pandemic (e.g. social distancing).  This format is felt to be most appropriate for this patient at this time.  All issues noted in this document were discussed and addressed.  No physical exam was performed (except for noted visual exam findings with Video Visits).   I connected withNAME@ on 03/09/22 at  1:00 PM EDT by telephone and verified that I am speaking with the correct person using two identifiers. Location patient: home Location provider: work or home office Persons participating in the virtual visit: patient, provider  I discussed the limitations, risks, security and privacy concerns of performing an evaluation and management service by telephone and the availability of in person appointments. I also discussed with the patient that there may be a patient responsible charge related to this service. The patient expressed understanding and agreed to proceed.  Interactive audio and video telecommunications were attempted between this provider and patient, however failed, due to patient did not have access to video capability.  We continued and completed visit with audio only.   Reason for visit: incomplete bladder emptying   HPI:  88 yr old female with history of atrophic vaginitis and  nephrolithiasis  presents for evaluation of symptoms of incomplete bladder emptying.  Symptoms began at the end of April . She has had a urinalysis done by her urologist at that time which was normal  in [facility].    Last week  she submitted a urine sample to our office  for analysis.  UA had 3-6 WBCs but the urine culture was negative. She continues to feel the need to return to the bathroom 5 minutes post void and produces a little more urine at that time.  She denies use of benadryl and other anticholinergics. She denies suprapubic pain, flank pain,  and nausea and has only a brief moment  of stinging at the end of her 2nd attempt at emptying  she has not used any abx or AZO recently.  She uses cranberry tablets and estrace vaginal cream regularly.  ROS: See pertinent positives and negatives per HPI.  Past Medical History:  Diagnosis Date   Arthritis    Basal cell carcinoma 06/04/2010   left upper back   Chronic kidney disease    Colon polyps    Gastritis    GERD (gastroesophageal reflux disease)    Hiatal hernia    Hyperlipidemia    Hypertension    IBS (irritable bowel syndrome)    Low back pain radiating to left leg 03/28/2020   Sciatica, left side 04/15/2020   Squamous cell carcinoma of skin 06/26/2013   left mid pretibial   Squamous cell carcinoma of skin 08/21/2015   lower sternum   Squamous cell carcinoma of skin 10/14/2017   right mid pretibial   Squamous cell carcinoma of skin 03/17/2018   right prox lateral elbow   Squamous cell carcinoma of skin 09/07/2018   right post thigh   Squamous cell carcinoma of skin 05/11/2019   right mid med pretibial   Squamous cell carcinoma of skin 06/20/2019   right bicep    Past Surgical History:  Procedure Laterality Date   APPENDECTOMY  1959   CARPAL TUNNEL RELEASE     CATARACT EXTRACTION W/ INTRAOCULAR LENS IMPLANT Left    CATARACT EXTRACTION W/PHACO Right 04/20/2018   Procedure: CATARACT EXTRACTION PHACO AND INTRAOCULAR LENS PLACEMENT ([place])  RIGHT;  Surgeon: Leandrew Koyanagi, MD;  Location: [place];  Service: Ophthalmology;  Laterality: Right;   CHOLECYSTECTOMY  1995   GANGLION CYST EXCISION     JOINT REPLACEMENT Right July 2009   [place]    KNEE ARTHROSCOPY     LITHOTRIPSY     SALIVARY GLAND SURGERY     SKIN CANCER EXCISION     TONSILLECTOMY     TONSILLECTOMY AND ADENOIDECTOMY     VEIN LIGATION      Family History  Problem Relation Age of Onset   Heart disease Mother    Arthritis Mother    Diabetes Father     SOCIAL HX:  reports that she has never smoked. She has never used smokeless  tobacco. She reports that she does not drink alcohol and does not use drugs.    Current Outpatient Medications:    acetaminophen (TYLENOL) 325 MG tablet, Take 650 mg by mouth every 6 (six) hours as needed., Disp: , Rfl:    Calcium Citrate-Vitamin D (CITRACAL + D PO), Take 2 capsules by mouth every other day. Alternates with Vitamin D3 and centrum silver, Disp: , Rfl:    Cholecalciferol (VITAMIN D) 50 MCG (2000 UT) CAPS, Take 1 capsule by mouth every other day., Disp: , Rfl:    CRANBERRY PO, Take 2 capsules by mouth daily. , Disp: , Rfl:    estradiol (ESTRACE) 0.1 MG/GM vaginal cream, estradiol 0.01% (0.1 mg/gram) vaginal cream, Disp: , Rfl:    ezetimibe (ZETIA) 10 MG tablet, Take 1 tablet (10 mg total) by mouth daily., Disp: 90 tablet, Rfl: 3   hyoscyamine (ANASPAZ) 0.125 MG TBDP disintergrating tablet, Place 1 tablet (0.125 mg total) under the tongue every 6 (six) hours as needed (as needed for esophageal spasm)., Disp: 30 tablet, Rfl: 0   metoprolol succinate (TOPROL XL) 25 MG 24 hr tablet, Take 1 tablet (25 mg total) by mouth in the morning and at bedtime. Can take 12.5 mg extra daily as needed for breakthrough SVT, Disp: 180 tablet, Rfl: 2   Multiple Vitamins-Minerals (CENTRUM SILVER 50+WOMEN PO), Take by mouth. Takes 1 tablet every third day. Alternates with citracal and Vitamin D., Disp: , Rfl:    omeprazole (PRILOSEC) 20 MG capsule, TAKE 1 CAPSULE BY MOUTH EVERY DAY, Disp: 90 capsule, Rfl: 1   ondansetron (ZOFRAN ODT) 4 MG disintegrating tablet, Take 1 tablet (4 mg total) by mouth every 8 (eight) hours as needed for nausea or vomiting., Disp: 20 tablet, Rfl: 0   Probiotic Product (ALIGN PO), Take 1 tablet by mouth daily. , Disp: , Rfl:   EXAM:  VITALS per patient if applicable:  GENERAL: alert, oriented, appears well and in no acute distress  HEENT: atraumatic, conjunttiva clear, no obvious abnormalities on inspection of external nose and ears  NECK: normal movements of the head and  neck  LUNGS: on inspection no signs of respiratory distress, breathing rate appears normal, no obvious gross SOB, gasping or wheezing  CV: no obvious cyanosis  MS: moves all visible extremities without noticeable abnormality  PSYCH/NEURO: pleasant and cooperative, no obvious depression or anxiety, speech and thought processing grossly intact  ASSESSMENT AND PLAN:  Discussed the following assessment and plan:  Incomplete bladder emptying  Urinary retention  Abnormal findings on diagnostic imaging of other abdominal regions, including retroperitoneum - Plan: US PELVIS LIMITED (TRANSABDOMINAL ONLY)  Incomplete bladder emptying Sensation has been present for almost 3 months .  Post void residual ordered to rule out retention given her history of nephrolithiasis .  There is  no evidence of UTI by culture results.     I discussed the assessment and treatment plan with the patient. The patient was provided an opportunity to ask questions and all were answered. The patient agreed with the plan and demonstrated an understanding of the instructions.   The patient was advised to call back or seek an in-person evaluation if the symptoms worsen or if the condition fails to improve as anticipated.   I spent 20 minutes dedicated to the care of this patient on the date of this encounter to include pre-visit review of her medical history,  including records available via Epic portal from her urologist at [facility] in a non Face-to-face time with the patient , and post visit ordering of testing and therapeutics.    Crecencio Mc, MD

## 2022-03-09 NOTE — Assessment & Plan Note (Addendum)
Sensation has been present for almost 3 months .  Post void residual ordered to rule out retention given her history of nephrolithiasis .  There is no evidence of UTI by culture results.

## 2022-03-10 ENCOUNTER — Encounter: Payer: Self-pay | Admitting: Internal Medicine

## 2022-03-12 ENCOUNTER — Telehealth: Payer: Self-pay | Admitting: Internal Medicine

## 2022-03-12 NOTE — Telephone Encounter (Signed)
Copied from [place] [id number]. Topic: Medicare AWV >> Mar 12, 2022  9:50 AM Devoria Glassing wrote: Reason for CRM: Left message for patient to schedule Annual Wellness Visit.  Please schedule with Nurse Health Advisor Denisa O'Brien-Blaney, LPN at [facility].  Please call [phone] ask for [facility]

## 2022-03-12 NOTE — Telephone Encounter (Signed)
Patient called and ask that all AWV phone calls stop. She states she has too much going on and really does not want to do a AWV.

## 2022-03-13 ENCOUNTER — Ambulatory Visit
Admission: RE | Admit: 2022-03-13 | Discharge: 2022-03-13 | Disposition: A | Discharge status: Home / Self Care | Payer: Medicare Other | Source: Ambulatory Visit | Attending: Internal Medicine | Admitting: Internal Medicine

## 2022-03-13 DIAGNOSIS — R935 Abnormal findings on diagnostic imaging of other abdominal regions, including retroperitoneum: Secondary | ICD-10-CM | POA: Diagnosis present

## 2022-03-16 ENCOUNTER — Telehealth: Payer: Self-pay

## 2022-03-16 NOTE — Telephone Encounter (Signed)
Pt called to say that the [place] she had done on Friday has been resulted and her urologist called her today to let her know the results. Pt stated that the urologist suggested that pt's symptoms could be coming from a vaginal infection and she would like to know if we could check her here at our office. Is it okay to schedule pt for a pelvic exam?

## 2022-03-17 ENCOUNTER — Encounter: Payer: Self-pay | Admitting: Internal Medicine

## 2022-03-17 NOTE — Telephone Encounter (Signed)
Spoke with pt and scheduled her for Thursday at 11 am. Pt is aware of appt date and time.

## 2022-03-19 ENCOUNTER — Encounter: Payer: Self-pay | Admitting: Internal Medicine

## 2022-03-19 ENCOUNTER — Ambulatory Visit (INDEPENDENT_AMBULATORY_CARE_PROVIDER_SITE_OTHER): Payer: Medicare Other | Admitting: Internal Medicine

## 2022-03-19 VITALS — BP 138/86 | HR 64 | Temp 97.8°F | Ht 66.0 in | Wt 135.0 lb

## 2022-03-19 DIAGNOSIS — E559 Vitamin D deficiency, unspecified: Secondary | ICD-10-CM

## 2022-03-19 DIAGNOSIS — E538 Deficiency of other specified B group vitamins: Secondary | ICD-10-CM

## 2022-03-19 DIAGNOSIS — L439 Lichen planus, unspecified: Secondary | ICD-10-CM

## 2022-03-19 LAB — VITAMIN D 25 HYDROXY (VIT D DEFICIENCY, FRACTURES): VITD: 59.91 ng/mL (ref 30.00–100.00)

## 2022-03-19 LAB — VITAMIN B12: Vitamin B-12: >1504 pg/mL — ABNORMAL HIGH (ref 211–911)

## 2022-03-19 MED ORDER — CLOBETASOL PROPIONATE 0.05 % EX CREA: 1.0000 | TOPICAL_CREAM | Freq: Two times a day (BID) | CUTANEOUS | 0 refills | Status: DC

## 2022-03-19 NOTE — Patient Instructions (Signed)
Your lichen planus is flaring.  This is causing the burning you are having  I want you to resume using the Temovate ointment twice daily .  Apply to the inner surface of each labia and to the urethra as well twice daily.  Continue using the estrogen cream  , but reduce the estrogen cream to every other night.  After one month. Reduce using the Temovate to every other night (alternating with the estrogen cream)

## 2022-03-19 NOTE — Progress Notes (Unsigned)
Subjective:  Patient ID: Brenda Cardenas, female    DOB: 06/12/1932  Age: 88 y.o. MRN: [id number]  CC: There were no encounter diagnoses.   HPI Brenda Cardenas  is an 88 yr old female with a history of atrophic vaginitis. nephrolithiasis and genital lichen planus (treated with clobetasol 0.05% bid  in the past) who presents for evaluation of persistent urinary complaints of incomplete emptying Chief Complaint  Patient presents with   Acute Visit    Pelvic exam   Prior to visit she was evaluated with a bladder ultrasound and had ZERO post void residual after 2 successive UA and cultures failed to provide evidence of infection. (Second one by me after  her urologist also did one that ws negative )  She presents for a pelvic exam today.  She continues to have frequency,  has not had dysuria for the past 3 days.    Using estrogen cream  every night  for years,  prescribed by Urology.  She cannot use the vaginal applicator bc it  causes bleeding so she is using a pea sized amount on her urethra   Lichen planus:  used clobetasol ointment for one month starting in  Dec 2021    Outpatient Medications Prior to Visit  Medication Sig Dispense Refill   acetaminophen (TYLENOL) 325 MG tablet Take 650 mg by mouth every 6 (six) hours as needed.     Calcium Citrate-Vitamin D (CITRACAL + D PO) Take 2 capsules by mouth every other day. Alternates with Vitamin D3 and centrum silver     Cholecalciferol (VITAMIN D) 50 MCG (2000 UT) CAPS Take 1 capsule by mouth every other day.     CRANBERRY PO Take 2 capsules by mouth daily.      estradiol (ESTRACE) 0.1 MG/GM vaginal cream estradiol 0.01% (0.1 mg/gram) vaginal cream     ezetimibe (ZETIA) 10 MG tablet Take 1 tablet (10 mg total) by mouth daily. 90 tablet 3   hyoscyamine (ANASPAZ) 0.125 MG TBDP disintergrating tablet Place 1 tablet (0.125 mg total) under the tongue every 6 (six) hours as needed (as needed for esophageal spasm). 30 tablet 0   metoprolol  succinate (TOPROL XL) 25 MG 24 hr tablet Take 1 tablet (25 mg total) by mouth in the morning and at bedtime. Can take 12.5 mg extra daily as needed for breakthrough SVT 180 tablet 2   Multiple Vitamins-Minerals (CENTRUM SILVER 50+WOMEN PO) Take by mouth. Takes 1 tablet every third day. Alternates with citracal and Vitamin D.     omeprazole (PRILOSEC) 20 MG capsule TAKE 1 CAPSULE BY MOUTH EVERY DAY 90 capsule 1   ondansetron (ZOFRAN ODT) 4 MG disintegrating tablet Take 1 tablet (4 mg total) by mouth every 8 (eight) hours as needed for nausea or vomiting. 20 tablet 0   Probiotic Product (ALIGN PO) Take 1 tablet by mouth daily.      No facility-administered medications prior to visit.    Review of Systems;  Patient denies headache, fevers, malaise, unintentional weight loss, skin rash, eye pain, sinus congestion and sinus pain, sore throat, dysphagia,  hemoptysis , cough, dyspnea, wheezing, chest pain, palpitations, orthopnea, edema, abdominal pain, nausea, melena, diarrhea, constipation, flank pain, dysuria, hematuria, urinary  Frequency, nocturia, numbness, tingling, seizures,  Focal weakness, Loss of consciousness,  Tremor, insomnia, depression, anxiety, and suicidal ideation.      Objective:  BP 138/86 (BP Location: Left Arm, Patient Position: Sitting, Cuff Size: Normal)   Pulse 64   Temp 97.8 F (36.6  C) (Oral)   Ht '5\' 6"'$  (1.676 m)   Wt 135 lb (61.2 kg)   SpO2 99%   BMI 21.79 kg/m   BP Readings from Last 3 Encounters:  03/19/22 138/86  02/02/22 124/80  01/14/22 130/70    Wt Readings from Last 3 Encounters:  03/19/22 135 lb (61.2 kg)  03/09/22 135 lb (61.2 kg)  02/02/22 137 lb (62.1 kg)    General appearance: alert, cooperative and appears stated age Ears: normal TM's and external ear canals both ears Throat: lips, mucosa, and tongue normal; teeth and gums normal Neck: no adenopathy, no carotid bruit, supple, symmetrical, trachea midline and thyroid not enlarged, symmetric,  no tenderness/mass/nodules Back: symmetric, no curvature. ROM normal. No CVA tenderness. Lungs: clear to auscultation bilaterally Heart: regular rate and rhythm, S1, S2 normal, no murmur, click, rub or gallop GYN:  bilateral erythema of labia minora and urethra without discharge, vaginal vault stenotic   Abdomen: soft, non-tender; bowel sounds normal; no masses,  no organomegaly Pulses: 2+ and symmetric Skin: Skin color, texture, turgor normal. No rashes or lesions Lymph nodes: Cervical, supraclavicular, and axillary nodes normal.  Lab Results  Component Value Date   HGBA1C 5.4 01/05/2020   HGBA1C 5.5 06/16/2018   HGBA1C 5.7 03/24/2018    Lab Results  Component Value Date   CREATININE 0.74 11/26/2021   CREATININE 0.95 10/10/2021   CREATININE 0.95 08/09/2021    Lab Results  Component Value Date   WBC 10.2 11/26/2021   HGB 12.0 11/26/2021   HCT 36.9 11/26/2021   PLT 171.0 11/26/2021   GLUCOSE 90 11/26/2021   CHOL 149 07/10/2021   TRIG 136.0 07/10/2021   HDL 46.10 07/10/2021   LDLDIRECT 103.0 06/16/2017   LDLCALC 76 07/10/2021   ALT 12 11/26/2021   AST 18 11/26/2021   NA 138 11/26/2021   K 4.4 11/26/2021   CL 105 11/26/2021   CREATININE 0.74 11/26/2021   BUN 24 (H) 11/26/2021   CO2 27 11/26/2021   TSH 1.730 10/10/2021   HGBA1C 5.4 01/05/2020   MICROALBUR 1.0 01/05/2020    US PELVIS LIMITED (TRANSABDOMINAL ONLY)  Result Date: 03/15/2022 CLINICAL DATA:  Sensation of full bladder post voiding. EXAM: LIMITED ULTRASOUND OF PELVIS TECHNIQUE: Limited transabdominal ultrasound examination of the pelvis was performed. COMPARISON:  None Available. FINDINGS: Initially the patient had an empty bladder. 35 minutes after drinking 20 ounces of water the prevoid bladder volume was approximately 40 cc. No postvoid residual seen. IMPRESSION: No postvoid residual. Electronically Signed   By: Anner Crete M.D.   On: 03/15/2022 19:38    Assessment & Plan:   Problem List Items  Addressed This Visit   None   I spent a total of   minutes with this patient in a face to face visit on the date of this encounter reviewing the last office visit with me on        ,  most recent with patient's cardiologist in    ,  patient'ss diet and eating habits, home blood pressure readings ,  most recent imaging study ,   and post visit ordering of testing and therapeutics.    Follow-up: No follow-ups on file.   Crecencio Mc, MD

## 2022-03-19 NOTE — Assessment & Plan Note (Signed)
Resume Temovate bid x  Month,  Then every other night alternating with estrogen cream

## 2022-03-20 ENCOUNTER — Encounter: Payer: Self-pay | Admitting: Internal Medicine

## 2022-03-23 NOTE — Telephone Encounter (Signed)
Patient came into the office concerning her B12 levels.... patient stated she was wondering why Dr Darrick Huntsman advised her that B12 was good and patient stated that her results show high.  Patient dropped off paperwork for Dr Darrick Huntsman to review and contact.  Placed paper work in Product/process development scientist upfront.

## 2022-03-30 LAB — HM MAMMOGRAPHY

## 2022-04-01 ENCOUNTER — Other Ambulatory Visit: Payer: Self-pay

## 2022-04-06 ENCOUNTER — Other Ambulatory Visit: Payer: Self-pay | Admitting: Internal Medicine

## 2022-04-08 ENCOUNTER — Emergency Department
Admission: EM | Admit: 2022-04-08 | Discharge: 2022-04-08 | Disposition: A | Discharge status: Home / Self Care | Payer: Medicare Other | Attending: Emergency Medicine | Admitting: Emergency Medicine

## 2022-04-08 ENCOUNTER — Emergency Department: Payer: Medicare Other

## 2022-04-08 DIAGNOSIS — I471 Supraventricular tachycardia: Secondary | ICD-10-CM | POA: Insufficient documentation

## 2022-04-08 DIAGNOSIS — D72829 Elevated white blood cell count, unspecified: Secondary | ICD-10-CM | POA: Diagnosis not present

## 2022-04-08 DIAGNOSIS — R778 Other specified abnormalities of plasma proteins: Secondary | ICD-10-CM | POA: Insufficient documentation

## 2022-04-08 DIAGNOSIS — Z79899 Other long term (current) drug therapy: Secondary | ICD-10-CM | POA: Insufficient documentation

## 2022-04-08 DIAGNOSIS — R000 Tachycardia, unspecified: Secondary | ICD-10-CM | POA: Diagnosis present

## 2022-04-08 DIAGNOSIS — I10 Essential (primary) hypertension: Secondary | ICD-10-CM | POA: Diagnosis not present

## 2022-04-08 LAB — CBC
HCT: 38.1 % (ref 36.0–46.0)
Hemoglobin: 12.3 g/dL (ref 12.0–15.0)
MCH: 31.2 pg (ref 26.0–34.0)
MCHC: 32.3 g/dL (ref 30.0–36.0)
MCV: 96.7 fL (ref 80.0–100.0)
Platelets: 213 10*3/uL (ref 150–400)
RBC: 3.94 MIL/uL (ref 3.87–5.11)
RDW: 19.3 % — ABNORMAL HIGH (ref 11.5–15.5)
WBC: 15.2 10*3/uL — ABNORMAL HIGH (ref 4.0–10.5)
nRBC: 0.7 % — ABNORMAL HIGH (ref 0.0–0.2)

## 2022-04-08 LAB — BASIC METABOLIC PANEL
Anion gap: 10 (ref 5–15)
BUN: 29 mg/dL — ABNORMAL HIGH (ref 8–23)
CO2: 22 mmol/L (ref 22–32)
Calcium: 9.7 mg/dL (ref 8.9–10.3)
Chloride: 110 mmol/L (ref 98–111)
Creatinine, Ser: 1 mg/dL (ref 0.44–1.00)
GFR, Estimated: 54 mL/min — ABNORMAL LOW (ref >60)
Glucose, Bld: 165 mg/dL — ABNORMAL HIGH (ref 70–99)
Potassium: 3.7 mmol/L (ref 3.5–5.1)
Sodium: 142 mmol/L (ref 135–145)

## 2022-04-08 LAB — TROPONIN I (HIGH SENSITIVITY)
Troponin I (High Sensitivity): 110 ng/L (ref <18)
Troponin I (High Sensitivity): 215 ng/L (ref <18)

## 2022-04-08 MED ORDER — SODIUM CHLORIDE 0.9 % IV BOLUS
1000.0000 mL | Freq: Once | INTRAVENOUS | Status: AC
  Administered 2022-04-08: 1000 mL via INTRAVENOUS

## 2022-04-08 MED ORDER — ADENOSINE 6 MG/2ML IV SOLN
6.0000 mg | Freq: Once | INTRAVENOUS | Status: AC
  Administered 2022-04-08: 6 mg via INTRAVENOUS
  Filled 2022-04-08: qty 2

## 2022-04-08 NOTE — ED Provider Notes (Signed)
[facility] Provider Note    Event Date/Time   First MD Initiated Contact with Patient 04/08/22 1203     (approximate)  History   Chief Complaint: SVT (Hx of SVT, pt was at PCP found to be in SVT sent to hospital.)  HPI  Brenda Cardenas is a 88 y.o. female with a past medical history of SVT, hypertension, hyperlipidemia, presents to the emergency department for rapid heart rate.  According to the patient last night she developed a rapid heart rate consistent with her prior episodes of SVT.  Patient was told to take a half a tablet of metoprolol if her heart rate increases.  Patient states she took a half a tablet overnight in addition to her full dose last night.  Woke up this morning and continued to have elevated heart rate and took another metoprolol without response to the patient came to the emergency department.  Patient denies any chest pain denies any shortness of breath, and can feel her heart racing and fluttering in her chest.  Denies any nausea or vomiting.  Physical Exam   Triage Vital Signs: ED Triage Vitals  Enc Vitals Group     BP 04/08/22 1150 122/86     Pulse Rate 04/08/22 1150 (!) 157     Resp 04/08/22 1150 20     Temp 04/08/22 1150 98 F (36.7 C)     Temp Source 04/08/22 1150 Oral     SpO2 04/08/22 1150 99 %     Weight 04/08/22 1154 135 lb (61.2 kg)     Height 04/08/22 1154 '5\' 6"'$  (1.676 m)     Head Circumference --      Peak Flow --      Pain Score 04/08/22 1154 0     Pain Loc --      Pain Edu? --      Excl. in [place]? --     Most recent vital signs: Vitals:   04/08/22 1150  BP: 122/86  Pulse: (!) 157  Resp: 20  Temp: 98 F (36.7 C)  SpO2: 99%    General: Awake, no distress.  CV:  Good peripheral perfusion.  Regular rate and rhythm  Resp:  Normal effort.  Equal breath sounds bilaterally.  Abd:  No distention.  Soft, nontender.  No rebound or guarding.   ED Results / Procedures / Treatments   EKG  EKG viewed and  interpreted by myself shows what appears to be SVT at 165 bpm with a narrow QRS, normal axis, normal intervals, slight ST depression fairly diffusely consistent with likely demand ischemia.  EKG viewed and interpreted by myself shows a normal sinus rhythm 87 bpm with a narrow QRS, left axis deviation, largely normal intervals with no concerning ST changes.   RADIOLOGY  I have reviewed and interpreted the patient's chest x-ray images I do not see any obvious acute abnormality on my evaluation. Radiology has read the chest x-ray as negative   MEDICATIONS ORDERED IN ED: Medications  adenosine (ADENOCARD) 6 MG/2ML injection 6 mg (6 mg Intravenous Given 04/08/22 1236)  sodium chloride 0.9 % bolus 1,000 mL (1,000 mLs Intravenous New Bag/Given 04/08/22 1234)     IMPRESSION / MDM / ASSESSMENT AND PLAN / ED COURSE  I reviewed the triage vital signs and the nursing notes.  Patient's presentation is most consistent with acute presentation with potential threat to life or bodily function.  Patient presents to the emergency department for rapid heart rate.  Patient states  a history of SVT.  Heart rate appears to be maintaining around 170 bpm and what appears to be irregular rhythm consistent with SVT.  Decision made by myself to give 6 mg of IV adenosine.  Patient had good results and converted back to normal sinus rhythm currently around 80 bpm.  Repeat EKG confirms normal sinus rhythm.  Patient's labs have resulted showing a reassuring chemistry, mild leukocytosis otherwise reassuring CBC.  Patient's troponin is elevated at 110.  However in reviewing the patient's historical records her last troponin was in the 90s.  We will recheck a troponin at 2 hours.  If there is significant elevation patient will likely require admission to the hospital for further treatment.  If there is no elevation patient could potentially go home per her wishes.  Patient's repeat troponin has elevated fairly significantly.   I spoke to Dr. Garen Lah who is covering for Dr. Saunders Revel.  He believes the troponin elevation is likely demand ischemia related.  Patient has no chest pain now or at any point.  They believe the patient is still safe for discharge home and can follow-up in cardiology office with Dr. Saunders Revel or Dr. Quentin Ore.  I spoke to the patient regarding this she is comfortable with this plan of care as well.  Patient will be discharged.  I did discuss very strict return precautions.  CRITICAL CARE Performed by: Harvest Dark   Total critical care time: 30 minutes  Critical care time was exclusive of separately billable procedures and treating other patients.  Critical care was necessary to treat or prevent imminent or life-threatening deterioration.  Critical care was time spent personally by me on the following activities: development of treatment plan with patient and/or surrogate as well as nursing, discussions with consultants, evaluation of patient's response to treatment, examination of patient, obtaining history from patient or surrogate, ordering and performing treatments and interventions, ordering and review of laboratory studies, ordering and review of radiographic studies, pulse oximetry and re-evaluation of patient's condition.   FINAL CLINICAL IMPRESSION(S) / ED DIAGNOSES   SVT   Note:  This document was prepared using Dragon voice recognition software and may include unintentional dictation errors.   Harvest Dark, MD 04/08/22 [id number]

## 2022-04-08 NOTE — ED Notes (Signed)
Pt A&O, IV's removed, pt given discharge instructions, pt assisted to vehicle.

## 2022-04-12 NOTE — Progress Notes (Unsigned)
Cardiology Office Note    Date:  04/13/2022   ID:  Brenda Cardenas, DOB 1932/01/02, MRN [id number]  PCP:  Crecencio Mc, MD  Cardiologist:  Nelva Bush, MD  Electrophysiologist:  Vickie Epley, MD   Chief Complaint: ED follow-up  History of Present Illness:   Brenda Cardenas is a 88 y.o. female with history of SVT, HTN, HLD, orthostatic lightheadedness, GERD, IBS, arthritis, and low back pain who presents for ED follow-up.  Echo in 08/20/2020 demonstrated an EF of 60 to 65%, no regional wall motion abnormalities, moderate asymmetric LVH of the basal septal segment, grade 2 diastolic dysfunction, normal RV systolic function with mildly enlarged ventricular cavity size, normal PASP, trivial mitral valve regurgitation, mild to moderate aortic valve sclerosis without evidence of stenosis, and an estimated right atrial pressure of 3 mmHg.  She was evaluated in 07/2021 for possible A-fib.  However, EKG was more consistent with SVT.  Subsequent outpatient cardiac monitor showed multiple episodes of paroxysmal SVT lasting up to 5 minutes.  She was evaluated by Dr. Quentin Ore who transitioned her from atenolol to metoprolol.  It was recommended if she continued to have symptomatic SVT, amiodarone would be the next option.  She was last seen in the office in 12/2021 and noted occasional palpitations, though these had improved with metoprolol use.  These episodes typically abated with vagal maneuvers.  She was advised to continue vagal maneuvers and take an additional metoprolol as needed for breakthrough symptoms.  She was seen in the ED on 04/08/2022 after being sent to the ED by her PCP with SVT.  Initial EKG showed SVT, 165 bpm, nonspecific ST-T changes.  She was given 6 mg of adenosine and converted to sinus rhythm.  Repeat EKG showed sinus rhythm, 87 bpm, and no acute ST-T changes.  High-sensitivity troponin 110 with a delta troponin of 215.  Case was discussed with Dr. Garen Lah, who felt like  the troponin was demand ischemia etiology with recommendation for the patient to follow-up in the office.  She comes in doing well from a cardiac perspective.  She has been without further episodes of palpitations.  No symptoms of angina or decompensation.  No presyncope or syncope.  No significant lower extremity swelling.  She does feel like there is an association with her back pain/sciatica flareups contributing to her episodes of palpitations.  She has researched amiodarone and would prefer to avoid that medication moving forward.   Labs independently reviewed: 03/2022 - Hgb 12.3, PLT 213, potassium 3.7, BUN 29, serum creatinine 1.00 11/2021 - albumin 4.0, AST/ALT normal 09/2021 - magnesium 2.2, TSH normal 06/2021 - TC 149, TG 136, HDL 46, LDL 76  Past Medical History:  Diagnosis Date   Arthritis    Basal cell carcinoma 06/04/2010   left upper back   Chronic kidney disease    Colon polyps    Gastritis    GERD (gastroesophageal reflux disease)    Hiatal hernia    Hyperlipidemia    Hypertension    IBS (irritable bowel syndrome)    Low back pain radiating to left leg 03/28/2020   Sciatica, left side 04/15/2020   Squamous cell carcinoma of skin 06/26/2013   left mid pretibial   Squamous cell carcinoma of skin 08/21/2015   lower sternum   Squamous cell carcinoma of skin 10/14/2017   right mid pretibial   Squamous cell carcinoma of skin 03/17/2018   right prox lateral elbow   Squamous cell carcinoma of skin 09/07/2018  right post thigh   Squamous cell carcinoma of skin 05/11/2019   right mid med pretibial   Squamous cell carcinoma of skin 06/20/2019   right bicep    Past Surgical History:  Procedure Laterality Date   APPENDECTOMY  1959   CARPAL TUNNEL RELEASE     CATARACT EXTRACTION W/ INTRAOCULAR LENS IMPLANT Left    CATARACT EXTRACTION W/PHACO Right 04/20/2018   Procedure: CATARACT EXTRACTION PHACO AND INTRAOCULAR LENS PLACEMENT ([place])  RIGHT;  Surgeon: Leandrew Koyanagi, MD;  Location: [facility];  Service: Ophthalmology;  Laterality: Right;   CHOLECYSTECTOMY  1995   EYE SURGERY     GANGLION CYST EXCISION     JOINT REPLACEMENT Right 03/2008   [facility]    KNEE ARTHROSCOPY     LITHOTRIPSY     SALIVARY GLAND SURGERY     SKIN CANCER EXCISION     TONSILLECTOMY     TONSILLECTOMY AND ADENOIDECTOMY     VEIN LIGATION      Current Medications: Current Meds  Medication Sig   acetaminophen (TYLENOL) 325 MG tablet Take 650 mg by mouth every 6 (six) hours as needed.   Calcium Citrate-Vitamin D (CITRACAL + D PO) Take 2 capsules by mouth every other day. Alternates with Vitamin D3 and centrum silver   Cholecalciferol (VITAMIN D) 50 MCG (2000 UT) CAPS Take 1 capsule by mouth every other day.   clobetasol cream (TEMOVATE) 4.69 % APPLY 1 APPLICATION TOPICALLY 2 TIMES DAILY TO ALL IRRITATED AREAS   CRANBERRY PO Take 2 capsules by mouth daily.    estradiol (ESTRACE) 0.1 MG/GM vaginal cream 1 Applicatorful every other day.   ezetimibe (ZETIA) 10 MG tablet Take 1 tablet (10 mg total) by mouth daily.   hyoscyamine (ANASPAZ) 0.125 MG TBDP disintergrating tablet Place 1 tablet (0.125 mg total) under the tongue every 6 (six) hours as needed (as needed for esophageal spasm).   metoprolol succinate (TOPROL XL) 25 MG 24 hr tablet Take 1 tablet (25 mg total) by mouth in the morning and at bedtime. Can take 12.5 mg extra daily as needed for breakthrough SVT   omeprazole (PRILOSEC) 20 MG capsule TAKE 1 CAPSULE BY MOUTH EVERY DAY   ondansetron (ZOFRAN ODT) 4 MG disintegrating tablet Take 1 tablet (4 mg total) by mouth every 8 (eight) hours as needed for nausea or vomiting.   Probiotic Product (ALIGN PO) Take 1 tablet by mouth daily.     Allergies:   Ciprofloxacin, Clindamycin/lincomycin, Sulfa antibiotics, Augmentin [amoxicillin-pot clavulanate], Gabapentin, Naproxen sodium, Prednisone, Tramadol, Codeine, Doxycycline hyclate, E-mycin [erythromycin], Macrobid  [nitrofurantoin], Nabumetone, Nitrofurantoin monohyd macro, Nsaids, Phenobarbital, and Statins   Social History   Socioeconomic History   Marital status: Single    Spouse name: Not on file   Number of children: Not on file   Years of education: Not on file   Highest education level: Not on file  Occupational History   Not on file  Tobacco Use   Smoking status: Never   Smokeless tobacco: Never  Vaping Use   Vaping Use: Never used  Substance and Sexual Activity   Alcohol use: No    Alcohol/week: 0.0 standard drinks of alcohol   Drug use: No   Sexual activity: Never  Other Topics Concern   Not on file  Social History Narrative   Not on file   Social Determinants of Health   Financial Resource Strain: Low Risk  (01/15/2021)   Overall Financial Resource Strain (CARDIA)    Difficulty of Paying  Living Expenses: Not hard at all  Food Insecurity: No Food Insecurity (01/15/2021)   Hunger Vital Sign    Worried About Running Out of Food in the Last Year: Never true    Ran Out of Food in the Last Year: Never true  Transportation Needs: No Transportation Needs (01/15/2021)   PRAPARE - Hydrologist (Medical): No    Lack of Transportation (Non-Medical): No  Physical Activity: Insufficiently Active (01/15/2021)   Exercise Vital Sign    Days of Exercise per Week: 3 days    Minutes of Exercise per Session: 30 min  Stress: No Stress Concern Present (01/15/2021)   [place]    Feeling of Stress : Not at all  Social Connections: Unknown (01/15/2021)   Social Connection and Isolation Panel [NHANES]    Frequency of Communication with Friends and Family: More than three times a week    Frequency of Social Gatherings with Friends and Family: Not on file    Attends Religious Services: Not on file    Active Member of Clubs or Organizations: Not on file    Attends Archivist Meetings: Not on  file    Marital Status: Not on file     Family History:  The patient's family history includes Arthritis in her mother; Diabetes in her father; Heart disease in her mother.  ROS:   12-point review of systems is negative as otherwise noted in the HPI.   EKGs/Labs/Other Studies Reviewed:    Studies reviewed were summarized above. The additional studies were reviewed today:  Zio patch 07/2021: The patient was monitored for 14 days. The predominant rhythm was sinus with an average rate of 64 bpm (range 41-93 bpm in sinus). There were rare PACs and PVCs. 11 episodes of supraventricular tachycardia were observed, lasting up to 4 minutes 58 seconds with a maximum rate of 184 bpm. No prolonged pauses occurred. Patient triggered events correspond to SVT.   Predominantly sinus rhythm with rare PACs and PVCs.  Multiple episodes of PSVT observed, lasting up to 5 minutes. __________  2D echo 08/20/2020: 1. Left ventricular ejection fraction, by estimation, is 60 to 65%. The  left ventricle has normal function. The left ventricle has no regional  wall motion abnormalities. There is moderate asymmetric left ventricular  hypertrophy of the basal-septal  segment. Left ventricular diastolic parameters are consistent with Grade  II diastolic dysfunction (pseudonormalization). Elevated left atrial  pressure.   2. Right ventricular systolic function is normal. The right ventricular  size is mildly enlarged. There is normal pulmonary artery systolic  pressure.   3. The mitral valve is normal in structure. Trivial mitral valve  regurgitation. No evidence of mitral stenosis.   4. The aortic valve is tricuspid. There is mild calcification of the  aortic valve. There is mild thickening of the aortic valve. Aortic valve  regurgitation is trivial. Mild to moderate aortic valve  sclerosis/calcification is present, without any  evidence of aortic stenosis.   5. The inferior vena cava is normal in size  with greater than 50%  respiratory variability, suggesting right atrial pressure of 3 mmHg.    EKG:  EKG is not ordered today.  Intake sheet indicates patient declined.  Recent Labs: 10/10/2021: Magnesium 2.2; TSH 1.730 11/26/2021: ALT 12 04/08/2022: BUN 29; Creatinine, Ser 1.00; Hemoglobin 12.3; Platelets 213; Potassium 3.7; Sodium 142  Recent Lipid Panel    Component Value Date/Time   CHOL 149  07/10/2021 0804   TRIG 136.0 07/10/2021 0804   HDL 46.10 07/10/2021 0804   CHOLHDL 3 07/10/2021 0804   VLDL 27.2 07/10/2021 0804   LDLCALC 76 07/10/2021 0804   LDLDIRECT 103.0 06/16/2017 0834    PHYSICAL EXAM:    VS:  BP 110/78 (BP Location: Right Arm, Patient Position: Sitting, Cuff Size: Normal)   Pulse 78   Ht '5\' 5"'$  (1.651 m)   Wt 135 lb (61.2 kg)   SpO2 98%   BMI 22.47 kg/m   BMI: Body mass index is 22.47 kg/m.  Physical Exam Vitals reviewed.  Constitutional:      Appearance: She is well-developed.  HENT:     Head: Normocephalic and atraumatic.  Eyes:     General:        Right eye: No discharge.        Left eye: No discharge.  Neck:     Vascular: No JVD.  Cardiovascular:     Rate and Rhythm: Normal rate and regular rhythm.     Heart sounds: Normal heart sounds, S1 normal and S2 normal. Heart sounds not distant. No midsystolic click and no opening snap. No murmur heard.    No friction rub.  Pulmonary:     Effort: Pulmonary effort is normal. No respiratory distress.     Breath sounds: Normal breath sounds. No decreased breath sounds, wheezing or rales.  Chest:     Chest wall: No tenderness.  Abdominal:     General: There is no distension.  Musculoskeletal:     Cervical back: Normal range of motion.     Right lower leg: No edema.     Left lower leg: No edema.  Skin:    General: Skin is warm and dry.     Nails: There is no clubbing.  Neurological:     Mental Status: She is alert and oriented to person, place, and time.  Psychiatric:        Speech: Speech normal.         Behavior: Behavior normal.        Thought Content: Thought content normal.        Judgment: Judgment normal.     Wt Readings from Last 3 Encounters:  04/13/22 135 lb (61.2 kg)  04/08/22 135 lb (61.2 kg)  03/19/22 135 lb (61.2 kg)     ASSESSMENT & PLAN:   SVT: Maintaining sinus rhythm following adenosine administration in the ED.  We did discuss addition of amiodarone to minimize SVT burden.  However, she would prefer to avoid that medication at this time.  We will continue current dose of metoprolol succinate 25 mg daily with an additional 12.5 mg as needed for breakthrough symptoms.  Continue vagal maneuvers as indicated.  Elevated high-sensitivity troponin: Likely supply demand ischemia in the setting of her tachyarrhythmia.  No symptoms of angina during this episode.  We discussed proceeding with an echo and Lexiscan MPI.  At this time, she would like to proceed with an echo with further recommendations pending thereafter as indicated.  Orthostatic lightheadedness: Overall appears to be relatively stable.  Continue current medical therapy.  This was not discussed in detail today given need to follow-up on acute issues outlined above.    Disposition: F/u with Dr. Saunders Revel as previously scheduled.    Medication Adjustments/Labs and Tests Ordered: Current medicines are reviewed at length with the patient today.  Concerns regarding medicines are outlined above. Medication changes, Labs and Tests ordered today are summarized above and listed  in the Patient Instructions accessible in Encounters.   Signed, Christell Faith, PA-C 04/13/2022 4:28 PM     [facility] [address] [place] [address] [place], [place] [address] [phone]

## 2022-04-13 ENCOUNTER — Encounter: Payer: Self-pay | Admitting: Physician Assistant

## 2022-04-13 ENCOUNTER — Ambulatory Visit (INDEPENDENT_AMBULATORY_CARE_PROVIDER_SITE_OTHER): Payer: Medicare Other | Admitting: Physician Assistant

## 2022-04-13 VITALS — BP 110/78 | HR 78 | Ht 65.0 in | Wt 135.0 lb

## 2022-04-13 DIAGNOSIS — I471 Supraventricular tachycardia: Secondary | ICD-10-CM | POA: Diagnosis not present

## 2022-04-13 DIAGNOSIS — I248 Other forms of acute ischemic heart disease: Secondary | ICD-10-CM

## 2022-04-13 NOTE — Patient Instructions (Addendum)
Medication Instructions:  - Your physician recommends that you continue on your current medications as directed. Please refer to the Current Medication list given to you today.  *If you need a refill on your cardiac medications before your next appointment, please call your pharmacy*   Lab Work: - none ordered  If you have labs (blood work) drawn today and your tests are completely normal, you will receive your results only by: [place] (if you have MyChart) OR A paper copy in the mail If you have any lab test that is abnormal or we need to change your treatment, we will call you to review the results.   Testing/Procedures:  1) Echocardiogram: - Your physician has requested that you have an echocardiogram. Echocardiography is a painless test that uses sound waves to create images of your heart. It provides your doctor with information about the size and shape of your heart and how well your heart's chambers and valves are working. This procedure takes approximately one hour. There are no restrictions for this procedure. There is a possibility that an IV may need to be started during your test to inject an image enhancing agent. This is done to obtain more optimal pictures of your heart. Therefore we ask that you do at least drink some water prior to coming in to hydrate your veins.     Follow-Up: At [facility], you and your health needs are our priority.  As part of our continuing mission to provide you with exceptional heart care, we have created designated Provider Care Teams.  These Care Teams include your primary Cardiologist (physician) and Advanced Practice Providers (APPs -  Physician Assistants and Nurse Practitioners) who all work together to provide you with the care you need, when you need it.  We recommend signing up for the patient portal called "MyChart".  Sign up information is provided on this After Visit Summary.  MyChart is used to connect with patients for  Virtual Visits (Telemedicine).  Patients are able to view lab/test results, encounter notes, upcoming appointments, etc.  Non-urgent messages can be sent to your provider as well.   To learn more about what you can do with MyChart, go to [email or web address].    Your next appointment:   October 2023 as scheduled   The format for your next appointment:   In Person  Provider:   Nelva Bush, MD    Other Instructions  Echocardiogram An echocardiogram is a test that uses sound waves (ultrasound) to produce images of the heart. Images from an echocardiogram can provide important information about: Heart size and shape. The size and thickness and movement of your heart's walls. Heart muscle function and strength. Heart valve function or if you have stenosis. Stenosis is when the heart valves are too narrow. If blood is flowing backward through the heart valves (regurgitation). A tumor or infectious growth around the heart valves. Areas of heart muscle that are not working well because of poor blood flow or injury from a heart attack. Aneurysm detection. An aneurysm is a weak or damaged part of an artery wall. The wall bulges out from the normal force of blood pumping through the body. Tell a health care provider about: Any allergies you have. All medicines you are taking, including vitamins, herbs, eye drops, creams, and over-the-counter medicines. Any blood disorders you have. Any surgeries you have had. Any medical conditions you have. Whether you are pregnant or may be pregnant. What are the risks? Generally, this is a safe  test. However, problems may occur, including an allergic reaction to dye (contrast) that may be used during the test. What happens before the test? No specific preparation is needed. You may eat and drink normally. What happens during the test?  You will take off your clothes from the waist up and put on a hospital gown. Electrodes or electrocardiogram  (ECG)patches may be placed on your chest. The electrodes or patches are then connected to a device that monitors your heart rate and rhythm. You will lie down on a table for an ultrasound exam. A gel will be applied to your chest to help sound waves pass through your skin. A handheld device, called a transducer, will be pressed against your chest and moved over your heart. The transducer produces sound waves that travel to your heart and bounce back (or "echo" back) to the transducer. These sound waves will be captured in real-time and changed into images of your heart that can be viewed on a video monitor. The images will be recorded on a computer and reviewed by your health care provider. You may be asked to change positions or hold your breath for a short time. This makes it easier to get different views or better views of your heart. In some cases, you may receive contrast through an IV in one of your veins. This can improve the quality of the pictures from your heart. The procedure may vary among health care providers and hospitals. What can I expect after the test? You may return to your normal, everyday life, including diet, activities, and medicines, unless your health care provider tells you not to do that. Follow these instructions at home: It is up to you to get the results of your test. Ask your health care provider, or the department that is doing the test, when your results will be ready. Keep all follow-up visits. This is important. Summary An echocardiogram is a test that uses sound waves (ultrasound) to produce images of the heart. Images from an echocardiogram can provide important information about the size and shape of your heart, heart muscle function, heart valve function, and other possible heart problems. You do not need to do anything to prepare before this test. You may eat and drink normally. After the echocardiogram is completed, you may return to your normal, everyday  life, unless your health care provider tells you not to do that. This information is not intended to replace advice given to you by your health care provider. Make sure you discuss any questions you have with your health care provider. Document Revised: 05/28/2021 Document Reviewed: 05/07/2020 Elsevier Patient Education  [place]

## 2022-04-15 ENCOUNTER — Other Ambulatory Visit: Payer: Medicare Other

## 2022-04-15 ENCOUNTER — Ambulatory Visit (INDEPENDENT_AMBULATORY_CARE_PROVIDER_SITE_OTHER): Payer: Medicare Other | Admitting: *Deleted

## 2022-04-15 ENCOUNTER — Ambulatory Visit: Payer: Medicare Other | Admitting: Internal Medicine

## 2022-04-15 DIAGNOSIS — M810 Age-related osteoporosis without current pathological fracture: Secondary | ICD-10-CM

## 2022-04-15 MED ORDER — DENOSUMAB 60 MG/ML ~~LOC~~ SOSY
60.0000 mg | PREFILLED_SYRINGE | Freq: Once | SUBCUTANEOUS | Status: AC
  Administered 2022-04-15: 60 mg via SUBCUTANEOUS

## 2022-04-15 NOTE — Progress Notes (Signed)
Pt received Prolia injection in left arm. Pt tolerated it well with no concerns or complaints.

## 2022-04-26 DIAGNOSIS — L98491 Non-pressure chronic ulcer of skin of other sites limited to breakdown of skin: Secondary | ICD-10-CM | POA: Insufficient documentation

## 2022-05-08 ENCOUNTER — Encounter: Payer: Self-pay | Admitting: Internal Medicine

## 2022-05-13 ENCOUNTER — Ambulatory Visit (INDEPENDENT_AMBULATORY_CARE_PROVIDER_SITE_OTHER): Payer: Medicare Other

## 2022-05-13 ENCOUNTER — Telehealth: Payer: Self-pay | Admitting: *Deleted

## 2022-05-13 DIAGNOSIS — I248 Other forms of acute ischemic heart disease: Secondary | ICD-10-CM

## 2022-05-13 DIAGNOSIS — I471 Supraventricular tachycardia: Secondary | ICD-10-CM

## 2022-05-13 LAB — ECHOCARDIOGRAM COMPLETE
AR max vel: 2.76 cm2
AV Area VTI: 2.74 cm2
AV Area mean vel: 2.54 cm2
AV Mean grad: 2 mmHg
AV Peak grad: 4.2 mmHg
Ao pk vel: 1.02 m/s
Area-P 1/2: 3.03 cm2
S' Lateral: 3.3 cm

## 2022-05-13 MED ORDER — CLOBETASOL PROPIONATE 0.05 % EX OINT: 1.0000 | TOPICAL_OINTMENT | Freq: Two times a day (BID) | CUTANEOUS | 1 refills | Status: DC

## 2022-05-13 NOTE — Telephone Encounter (Signed)
Was called to Echo room to meet with patient.   The patient wished to discuss her SVT. She has been having episodes 2-3 times a week HRs 150's . She has palpitations with no other symptoms. Patient takes extra 12.5 mg of metoprolol succinate when this occurs. States the only thing that will make it go away is if she drinks Gatorade. Educated the patient about dehydration possibly being a trigger, patient wanted to argue that this could not be the cause as she drinks a lot. Of note patient seems very anxious now and when this occurs as well. Educated about relaxation techniques to slow the heart rate down. Patient brought up that Dr. Quentin Ore mentioned to her in the past about Amiodarone and she looked up this drug, and there is no way she will take this medication. Educated the patient more about the drug and that the side effects she expressed concerns with are long term side effects. Advised the patient that I would update Dr. Quentin Ore and call her with his recommendation. She became frustrated saying she wanted to see him right now and she was not making any medications changes until she met with him.    Per MD Quentin Ore, schedule patient next available office visit.   This was done at check out before the patient left the office.

## 2022-05-15 ENCOUNTER — Telehealth: Payer: Self-pay | Admitting: *Deleted

## 2022-05-15 NOTE — Telephone Encounter (Signed)
-----   Message from Brenda Gianotti, NP sent at 05/14/2022  4:08 PM EDT ----- Normal heart squeezing function.  Moderately stiff left ventricle.  Mildly leaky mitral valve.  Some thickening of the aortic valve without any significant stenosis.  Overall reassuring.  We manage stiffness of the heart muscle with good blood pressure and heart rate control, both of which were present at her visit in July.

## 2022-05-15 NOTE — Telephone Encounter (Signed)
Spoke with patient and reviewed results. She then went on to discuss her concerns of recurrent SVT episodes. She was very upset regarding a series of events and bad experiences with staff. She is very concerned and worried about these episodes she is having. She lives at [facility] and they have had to call EMS to try and help get her heart rates down.  Reviewed schedule and there was opening next week. Scheduled her to come in to discuss her recurrent SVT episodes. Our call was over 25 minutes and she discussed so much information. She was very appreciative for the time and assisting with getting her appointment. Advised I would make manager aware of her experiences. She verbalized understanding with no further concerns at this time.

## 2022-05-19 ENCOUNTER — Ambulatory Visit: Payer: Medicare Other | Admitting: Dermatology

## 2022-05-19 NOTE — Progress Notes (Unsigned)
Electrophysiology Office Follow up Visit Note:    Date:  05/20/2022   ID:  Brenda Cardenas, DOB November 22, 1931, MRN [id number]  PCP:  Crecencio Mc, MD  [facility] Cardiologist:  Nelva Bush, MD  [facility] Electrophysiologist:  Vickie Epley, MD    Interval History:    Brenda Cardenas is a 88 y.o. female who presents for a follow up visit. They were last seen in clinic 10/29/2021 for SVT. At that appointment, I changed her atenolol to toprol-xl.   She presented for an echo this month and reported increasing frequency of SVT episodes despite metoprolol. She presents today to discuss.  She has had multiple episodes of SVT since I last saw her.  She continues to have episodes 3-4 times per week that are highly symptomatic.  Rates are in the 160s and 170s.      Past Medical History:  Diagnosis Date   Arthritis    Basal cell carcinoma 06/04/2010   left upper back   Chronic kidney disease    Colon polyps    Gastritis    GERD (gastroesophageal reflux disease)    Hiatal hernia    Hyperlipidemia    Hypertension    IBS (irritable bowel syndrome)    Low back pain radiating to left leg 03/28/2020   Sciatica, left side 04/15/2020   Squamous cell carcinoma of skin 06/26/2013   left mid pretibial   Squamous cell carcinoma of skin 08/21/2015   lower sternum   Squamous cell carcinoma of skin 10/14/2017   right mid pretibial   Squamous cell carcinoma of skin 03/17/2018   right prox lateral elbow   Squamous cell carcinoma of skin 09/07/2018   right post thigh   Squamous cell carcinoma of skin 05/11/2019   right mid med pretibial   Squamous cell carcinoma of skin 06/20/2019   right bicep    Past Surgical History:  Procedure Laterality Date   APPENDECTOMY  1959   CARPAL TUNNEL RELEASE     CATARACT EXTRACTION W/ INTRAOCULAR LENS IMPLANT Left    CATARACT EXTRACTION W/PHACO Right 04/20/2018   Procedure: CATARACT EXTRACTION PHACO AND INTRAOCULAR LENS PLACEMENT ([place])  RIGHT;   Surgeon: Leandrew Koyanagi, MD;  Location: [place];  Service: Ophthalmology;  Laterality: Right;   CHOLECYSTECTOMY  1995   EYE SURGERY     GANGLION CYST EXCISION     JOINT REPLACEMENT Right 03/2008   [place]    KNEE ARTHROSCOPY     LITHOTRIPSY     SALIVARY GLAND SURGERY     SKIN CANCER EXCISION     TONSILLECTOMY     TONSILLECTOMY AND ADENOIDECTOMY     VEIN LIGATION      Current Medications: Current Meds  Medication Sig   acetaminophen (TYLENOL) 325 MG tablet Take 650 mg by mouth every 6 (six) hours as needed.   Calcium Citrate-Vitamin D (CITRACAL + D PO) Take 2 capsules by mouth every other day. Alternates with Vitamin D3 and centrum silver   Cholecalciferol (VITAMIN D) 50 MCG (2000 UT) CAPS Take 1 capsule by mouth every other day.   clobetasol ointment (TEMOVATE) 7.65 % Apply 1 Application topically 2 (two) times daily.   CRANBERRY PO Take 2 capsules by mouth daily.    estradiol (ESTRACE) 0.1 MG/GM vaginal cream 1 Applicatorful every other day.   ezetimibe (ZETIA) 10 MG tablet Take 1 tablet (10 mg total) by mouth daily.   hyoscyamine (ANASPAZ) 0.125 MG TBDP disintergrating tablet Place 1 tablet (0.125 mg total) under  the tongue every 6 (six) hours as needed (as needed for esophageal spasm).   metoprolol succinate (TOPROL XL) 25 MG 24 hr tablet Take 1 tablet (25 mg total) by mouth in the morning and at bedtime. Can take 12.5 mg extra daily as needed for breakthrough SVT   omeprazole (PRILOSEC) 20 MG capsule TAKE 1 CAPSULE BY MOUTH EVERY DAY   ondansetron (ZOFRAN ODT) 4 MG disintegrating tablet Take 1 tablet (4 mg total) by mouth every 8 (eight) hours as needed for nausea or vomiting.   Probiotic Product (ALIGN PO) Take 1 tablet by mouth daily.      Allergies:   Ciprofloxacin, Clindamycin/lincomycin, Sulfa antibiotics, Augmentin [amoxicillin-pot clavulanate], Gabapentin, Naproxen sodium, Prednisone, Tramadol, Codeine, Doxycycline hyclate, E-mycin [erythromycin], Macrobid  [nitrofurantoin], Nabumetone, Nitrofurantoin monohyd macro, Nsaids, Phenobarbital, and Statins   Social History   Socioeconomic History   Marital status: Single    Spouse name: Not on file   Number of children: Not on file   Years of education: Not on file   Highest education level: Not on file  Occupational History   Not on file  Tobacco Use   Smoking status: Never   Smokeless tobacco: Never  Vaping Use   Vaping Use: Never used  Substance and Sexual Activity   Alcohol use: No    Alcohol/week: 0.0 standard drinks of alcohol   Drug use: No   Sexual activity: Never  Other Topics Concern   Not on file  Social History Narrative   Not on file   Social Determinants of Health   Financial Resource Strain: Low Risk  (01/15/2021)   Overall Financial Resource Strain (CARDIA)    Difficulty of Paying Living Expenses: Not hard at all  Food Insecurity: No Food Insecurity (01/15/2021)   Hunger Vital Sign    Worried About Running Out of Food in the Last Year: Never true    Ran Out of Food in the Last Year: Never true  Transportation Needs: No Transportation Needs (01/15/2021)   PRAPARE - Hydrologist (Medical): No    Lack of Transportation (Non-Medical): No  Physical Activity: Insufficiently Active (01/15/2021)   Exercise Vital Sign    Days of Exercise per Week: 3 days    Minutes of Exercise per Session: 30 min  Stress: No Stress Concern Present (01/15/2021)   [place]    Feeling of Stress : Not at all  Social Connections: Unknown (01/15/2021)   Social Connection and Isolation Panel [NHANES]    Frequency of Communication with Friends and Family: More than three times a week    Frequency of Social Gatherings with Friends and Family: Not on file    Attends Religious Services: Not on file    Active Member of Clubs or Organizations: Not on file    Attends Archivist Meetings: Not on  file    Marital Status: Not on file     Family History: The patient's family history includes Arthritis in her mother; Diabetes in her father; Heart disease in her mother.  ROS:   Please see the history of present illness.    All other systems reviewed and are negative.  EKGs/Labs/Other Studies Reviewed:    The following studies were reviewed today:    Recent Labs: 10/10/2021: Magnesium 2.2; TSH 1.730 11/26/2021: ALT 12 04/08/2022: BUN 29; Creatinine, Ser 1.00; Hemoglobin 12.3; Platelets 213; Potassium 3.7; Sodium 142  Recent Lipid Panel    Component Value Date/Time  CHOL 149 07/10/2021 0804   TRIG 136.0 07/10/2021 0804   HDL 46.10 07/10/2021 0804   CHOLHDL 3 07/10/2021 0804   VLDL 27.2 07/10/2021 0804   LDLCALC 76 07/10/2021 0804   LDLDIRECT 103.0 06/16/2017 0834    Physical Exam:    VS:  BP 114/66   Pulse 77   Ht '5\' 5"'$  (1.651 m)   Wt 137 lb 6.4 oz (62.3 kg)   SpO2 97%   BMI 22.86 kg/m     Wt Readings from Last 3 Encounters:  05/20/22 137 lb 6.4 oz (62.3 kg)  04/13/22 135 lb (61.2 kg)  04/08/22 135 lb (61.2 kg)     GEN:  Well nourished, well developed in no acute distress.  Elderly.  Using rollator. HEENT: Normal NECK: No JVD; No carotid bruits LYMPHATICS: No lymphadenopathy CARDIAC: RRR, no murmurs, rubs, gallops RESPIRATORY:  Clear to auscultation without rales, wheezing or rhonchi  ABDOMEN: Soft, non-tender, non-distended MUSCULOSKELETAL:  No edema; No deformity  SKIN: Warm and dry NEUROLOGIC:  Alert and oriented x 3 PSYCHIATRIC:  Normal affect        ASSESSMENT:    1. SVT (supraventricular tachycardia) ([place])   2. Essential hypertension    PLAN:    In order of problems listed above:   #SVT Decrease metoprolol to 25 mg by mouth once daily. I had a long discussion with the patient today about amiodarone including its risks.   Add amiodarone 200 mg by mouth twice daily for 7 days followed by 200 mg by mouth once daily thereafter. Check CMP,  TSH and free T4 today.  She will need repeat labs in 6 to 8 weeks (CMP, TSH and free T4). Follow-up with an APP in 3 months.    #Hypertension Controlled Continue current medical therapy   F/u APP 3 months   Total time spent with patient today 41 minutes. This includes reviewing records, evaluating the patient and coordinating care.   Medication Adjustments/Labs and Tests Ordered: Current medicines are reviewed at length with the patient today.  Concerns regarding medicines are outlined above.  No orders of the defined types were placed in this encounter.  No orders of the defined types were placed in this encounter.    Signed, Lars Mage, MD, [facility], [facility] 05/20/2022 10:09 AM    Electrophysiology [facility]

## 2022-05-20 ENCOUNTER — Encounter: Payer: Self-pay | Admitting: Cardiology

## 2022-05-20 ENCOUNTER — Ambulatory Visit (INDEPENDENT_AMBULATORY_CARE_PROVIDER_SITE_OTHER): Payer: Medicare Other | Admitting: Cardiology

## 2022-05-20 ENCOUNTER — Other Ambulatory Visit
Admission: RE | Admit: 2022-05-20 | Discharge: 2022-05-20 | Disposition: A | Discharge status: Home / Self Care | Payer: Medicare Other | Source: Ambulatory Visit | Attending: Cardiology | Admitting: Cardiology

## 2022-05-20 VITALS — BP 114/66 | HR 77 | Ht 65.0 in | Wt 137.4 lb

## 2022-05-20 DIAGNOSIS — Z79899 Other long term (current) drug therapy: Secondary | ICD-10-CM

## 2022-05-20 DIAGNOSIS — I471 Supraventricular tachycardia: Secondary | ICD-10-CM | POA: Diagnosis present

## 2022-05-20 DIAGNOSIS — I10 Essential (primary) hypertension: Secondary | ICD-10-CM | POA: Insufficient documentation

## 2022-05-20 LAB — COMPREHENSIVE METABOLIC PANEL
ALT: 12 U/L (ref 0–44)
AST: 20 U/L (ref 15–41)
Albumin: 3.8 g/dL (ref 3.5–5.0)
Alkaline Phosphatase: 58 U/L (ref 38–126)
Anion gap: 6 (ref 5–15)
BUN: 24 mg/dL — ABNORMAL HIGH (ref 8–23)
CO2: 25 mmol/L (ref 22–32)
Calcium: 8.6 mg/dL — ABNORMAL LOW (ref 8.9–10.3)
Chloride: 108 mmol/L (ref 98–111)
Creatinine, Ser: 0.73 mg/dL (ref 0.44–1.00)
GFR, Estimated: >60 mL/min (ref >60)
Glucose, Bld: 98 mg/dL (ref 70–99)
Potassium: 3.9 mmol/L (ref 3.5–5.1)
Sodium: 139 mmol/L (ref 135–145)
Total Bilirubin: 1 mg/dL (ref 0.3–1.2)
Total Protein: 6.6 g/dL (ref 6.5–8.1)

## 2022-05-20 LAB — TSH: TSH: 0.739 u[IU]/mL (ref 0.350–4.500)

## 2022-05-20 LAB — T4, FREE: Free T4: 0.84 ng/dL (ref 0.61–1.12)

## 2022-05-20 MED ORDER — AMIODARONE HCL 200 MG PO TABS: ORAL_TABLET | ORAL | 3 refills | Status: DC

## 2022-05-20 MED ORDER — METOPROLOL SUCCINATE ER 25 MG PO TB24: 25.0000 mg | ORAL_TABLET | Freq: Every day | ORAL | 11 refills | Status: DC

## 2022-05-20 NOTE — Patient Instructions (Signed)
Medication Instructions:  Reduce Metoprolol succinate to 25 mg daily Start Amiodarone 200 mg two times a day for 7 days, then daily *If you need a refill on your cardiac medications before your next appointment, please call your pharmacy*   Lab Work: CMP,TSH FREE T4 today and again in 6-8 weeks [facility] Entrance at [facility] 1st desk on the right to check in Lab hours: 7:30 am- 5:30 pm (walk in basis)  no fasting needed. If you have labs (blood work) drawn today and your tests are completely normal, you will receive your results only by: [place] (if you have MyChart) OR A paper copy in the mail If you have any lab test that is abnormal or we need to change your treatment, we will call you to review the results.   Testing/Procedures: none   Follow-Up: At [facility], you and your health needs are our priority.  As part of our continuing mission to provide you with exceptional heart care, we have created designated Provider Care Teams.  These Care Teams include your primary Cardiologist (physician) and Advanced Practice Providers (APPs -  Physician Assistants and Nurse Practitioners) who all work together to provide you with the care you need, when you need it.  We recommend signing up for the patient portal called "MyChart".  Sign up information is provided on this After Visit Summary.  MyChart is used to connect with patients for Virtual Visits (Telemedicine).  Patients are able to view lab/test results, encounter notes, upcoming appointments, etc.  Non-urgent messages can be sent to your provider as well.   To learn more about what you can do with MyChart, go to [email or web address].    Your next appointment:   3 month(s)  The format for your next appointment:   In Person  Provider:   You will see one of the following Advanced Practice Providers on your designated Care Team:   Murray Hodgkins, NP Christell Faith, PA-C Cadence Kathlen Mody, [place]  Other  Instructions   Important Information About Sugar

## 2022-05-24 ENCOUNTER — Encounter: Payer: Self-pay | Admitting: Cardiology

## 2022-05-24 ENCOUNTER — Encounter: Payer: Self-pay | Admitting: Internal Medicine

## 2022-05-25 ENCOUNTER — Telehealth: Payer: Self-pay | Admitting: Cardiology

## 2022-05-25 DIAGNOSIS — R000 Tachycardia, unspecified: Secondary | ICD-10-CM | POA: Insufficient documentation

## 2022-05-25 NOTE — Telephone Encounter (Signed)
Sent related mychart message to pharmD for advisement on Amiodarone side effects.

## 2022-05-25 NOTE — Telephone Encounter (Signed)
Patient says that she was supposed to call if she was having loose yellow stools. Says that it has been going on for the past three days

## 2022-05-25 NOTE — Telephone Encounter (Signed)
Mychart message:   Brenda Cardenas    05/24/22 10:13 PM Dr Quentin Ore, I saw you Wednesday August August 23rd when you changed my Metoprolol succinate '25mg'$  to once a day and added Amiodarone '200mg'$  twice a day for 7 days then once daily after the 7 days. I began this change on August 24th. This evening I completed day 4. Yesterday afternoon and this evening I had loose bowels which were a light yellow color. The attachment in my after visit summary for 8:/23 stated that is a pside effect that  should be reported to my care team as soon as possible. This does concern me. Ciprofloxacin is listed first on the attached list of my many allergies to medicines that sent me to the ER with incontinent bloody diarrhea which also ruptured internal hemorrhoids about 4 years. Diet also concerns me about what I should not be eating when taking my two heart medicines.  Marcelle Overlie D, RPH-CPP   Diarrhea could be from amiodarone. Will leave decision to Dr. Quentin Ore

## 2022-05-28 ENCOUNTER — Other Ambulatory Visit (INDEPENDENT_AMBULATORY_CARE_PROVIDER_SITE_OTHER): Payer: Medicare Other

## 2022-05-28 DIAGNOSIS — E785 Hyperlipidemia, unspecified: Secondary | ICD-10-CM | POA: Diagnosis not present

## 2022-05-28 DIAGNOSIS — I10 Essential (primary) hypertension: Secondary | ICD-10-CM

## 2022-05-28 DIAGNOSIS — D72829 Elevated white blood cell count, unspecified: Secondary | ICD-10-CM | POA: Diagnosis not present

## 2022-05-28 LAB — COMPREHENSIVE METABOLIC PANEL
ALT: 11 U/L (ref 0–35)
AST: 16 U/L (ref 0–37)
Albumin: 3.8 g/dL (ref 3.5–5.2)
Alkaline Phosphatase: 57 U/L (ref 39–117)
BUN: 18 mg/dL (ref 6–23)
CO2: 26 mEq/L (ref 19–32)
Calcium: 9 mg/dL (ref 8.4–10.5)
Chloride: 106 mEq/L (ref 96–112)
Creatinine, Ser: 0.83 mg/dL (ref 0.40–1.20)
GFR: 62.92 mL/min (ref >60.00)
Glucose, Bld: 98 mg/dL (ref 70–99)
Potassium: 3.9 mEq/L (ref 3.5–5.1)
Sodium: 139 mEq/L (ref 135–145)
Total Bilirubin: 0.7 mg/dL (ref 0.2–1.2)
Total Protein: 6 g/dL (ref 6.0–8.3)

## 2022-05-28 LAB — LIPID PANEL
Cholesterol: 150 mg/dL (ref 0–200)
HDL: 54 mg/dL (ref >39.00)
LDL Cholesterol: 79 mg/dL (ref 0–99)
NonHDL: 95.94
Total CHOL/HDL Ratio: 3
Triglycerides: 86 mg/dL (ref 0.0–149.0)
VLDL: 17.2 mg/dL (ref 0.0–40.0)

## 2022-05-28 LAB — CBC WITH DIFFERENTIAL/PLATELET
Basophils Absolute: 0.1 10*3/uL (ref 0.0–0.1)
Basophils Relative: 1.3 % (ref 0.0–3.0)
Eosinophils Absolute: 0.3 10*3/uL (ref 0.0–0.7)
Eosinophils Relative: 3.3 % (ref 0.0–5.0)
HCT: 33.2 % — ABNORMAL LOW (ref 36.0–46.0)
Hemoglobin: 11.2 g/dL — ABNORMAL LOW (ref 12.0–15.0)
Lymphocytes Relative: 22.5 % (ref 12.0–46.0)
Lymphs Abs: 1.9 10*3/uL (ref 0.7–4.0)
MCHC: 33.8 g/dL (ref 30.0–36.0)
MCV: 96.9 fl (ref 78.0–100.0)
Monocytes Absolute: 0.3 10*3/uL (ref 0.1–1.0)
Monocytes Relative: 4.2 % (ref 3.0–12.0)
Neutro Abs: 5.7 10*3/uL (ref 1.4–7.7)
Neutrophils Relative %: 68.7 % (ref 43.0–77.0)
Platelets: 167 10*3/uL (ref 150.0–400.0)
RBC: 3.43 Mil/uL — ABNORMAL LOW (ref 3.87–5.11)
RDW: 23.3 % — ABNORMAL HIGH (ref 11.5–15.5)
WBC: 8.2 10*3/uL (ref 4.0–10.5)

## 2022-05-28 LAB — TSH: TSH: 2.14 u[IU]/mL (ref 0.35–5.50)

## 2022-05-28 MED ORDER — AMIODARONE HCL 200 MG PO TABS: 200.0000 mg | ORAL_TABLET | Freq: Every day | ORAL | 3 refills | Status: DC

## 2022-05-30 ENCOUNTER — Observation Stay
Admission: EM | Admit: 2022-05-30 | Discharge: 2022-05-31 | Disposition: A | Discharge status: Home / Self Care | Payer: Medicare Other | Attending: Internal Medicine | Admitting: Internal Medicine

## 2022-05-30 ENCOUNTER — Other Ambulatory Visit: Payer: Self-pay

## 2022-05-30 DIAGNOSIS — Z79899 Other long term (current) drug therapy: Secondary | ICD-10-CM | POA: Insufficient documentation

## 2022-05-30 DIAGNOSIS — I129 Hypertensive chronic kidney disease with stage 1 through stage 4 chronic kidney disease, or unspecified chronic kidney disease: Secondary | ICD-10-CM | POA: Diagnosis not present

## 2022-05-30 DIAGNOSIS — I471 Supraventricular tachycardia, unspecified: Secondary | ICD-10-CM

## 2022-05-30 DIAGNOSIS — R0602 Shortness of breath: Secondary | ICD-10-CM | POA: Insufficient documentation

## 2022-05-30 DIAGNOSIS — N189 Chronic kidney disease, unspecified: Secondary | ICD-10-CM | POA: Diagnosis not present

## 2022-05-30 DIAGNOSIS — I4891 Unspecified atrial fibrillation: Secondary | ICD-10-CM

## 2022-05-30 DIAGNOSIS — R002 Palpitations: Secondary | ICD-10-CM | POA: Diagnosis present

## 2022-05-30 DIAGNOSIS — Z96651 Presence of right artificial knee joint: Secondary | ICD-10-CM | POA: Diagnosis not present

## 2022-05-30 DIAGNOSIS — Z85828 Personal history of other malignant neoplasm of skin: Secondary | ICD-10-CM | POA: Diagnosis not present

## 2022-05-30 DIAGNOSIS — I10 Essential (primary) hypertension: Secondary | ICD-10-CM | POA: Diagnosis present

## 2022-05-30 DIAGNOSIS — D649 Anemia, unspecified: Secondary | ICD-10-CM | POA: Diagnosis present

## 2022-05-30 DIAGNOSIS — F411 Generalized anxiety disorder: Secondary | ICD-10-CM | POA: Diagnosis present

## 2022-05-30 LAB — URINALYSIS, ROUTINE W REFLEX MICROSCOPIC
Bilirubin Urine: NEGATIVE
Glucose, UA: NEGATIVE mg/dL
Ketones, ur: NEGATIVE mg/dL
Leukocytes,Ua: NEGATIVE
Nitrite: NEGATIVE
Protein, ur: NEGATIVE mg/dL
Specific Gravity, Urine: 1.003 — ABNORMAL LOW (ref 1.005–1.030)
Squamous Epithelial / HPF: NONE SEEN (ref 0–5)
pH: 7 (ref 5.0–8.0)

## 2022-05-30 LAB — COMPREHENSIVE METABOLIC PANEL
ALT: 13 U/L (ref 0–44)
AST: 20 U/L (ref 15–41)
Albumin: 3.5 g/dL (ref 3.5–5.0)
Alkaline Phosphatase: 50 U/L (ref 38–126)
Anion gap: 4 — ABNORMAL LOW (ref 5–15)
BUN: 20 mg/dL (ref 8–23)
CO2: 26 mmol/L (ref 22–32)
Calcium: 8.7 mg/dL — ABNORMAL LOW (ref 8.9–10.3)
Chloride: 109 mmol/L (ref 98–111)
Creatinine, Ser: 0.81 mg/dL (ref 0.44–1.00)
GFR, Estimated: >60 mL/min (ref >60)
Glucose, Bld: 135 mg/dL — ABNORMAL HIGH (ref 70–99)
Potassium: 3.5 mmol/L (ref 3.5–5.1)
Sodium: 139 mmol/L (ref 135–145)
Total Bilirubin: 0.7 mg/dL (ref 0.3–1.2)
Total Protein: 6.2 g/dL — ABNORMAL LOW (ref 6.5–8.1)

## 2022-05-30 LAB — CBC WITH DIFFERENTIAL/PLATELET
Abs Immature Granulocytes: 0.07 10*3/uL (ref 0.00–0.07)
Basophils Absolute: 0.1 10*3/uL (ref 0.0–0.1)
Basophils Relative: 1 %
Eosinophils Absolute: 0.2 10*3/uL (ref 0.0–0.5)
Eosinophils Relative: 2 %
HCT: 31.3 % — ABNORMAL LOW (ref 36.0–46.0)
Hemoglobin: 10 g/dL — ABNORMAL LOW (ref 12.0–15.0)
Immature Granulocytes: 1 %
Lymphocytes Relative: 22 %
Lymphs Abs: 2.2 10*3/uL (ref 0.7–4.0)
MCH: 31.5 pg (ref 26.0–34.0)
MCHC: 31.9 g/dL (ref 30.0–36.0)
MCV: 98.7 fL (ref 80.0–100.0)
Monocytes Absolute: 0.5 10*3/uL (ref 0.1–1.0)
Monocytes Relative: 5 %
Neutro Abs: 7.1 10*3/uL (ref 1.7–7.7)
Neutrophils Relative %: 69 %
Platelets: 170 10*3/uL (ref 150–400)
RBC: 3.17 MIL/uL — ABNORMAL LOW (ref 3.87–5.11)
RDW: 19.9 % — ABNORMAL HIGH (ref 11.5–15.5)
WBC: 10.3 10*3/uL (ref 4.0–10.5)
nRBC: 0.3 % — ABNORMAL HIGH (ref 0.0–0.2)

## 2022-05-30 LAB — BRAIN NATRIURETIC PEPTIDE: B Natriuretic Peptide: 137.6 pg/mL — ABNORMAL HIGH (ref 0.0–100.0)

## 2022-05-30 LAB — MAGNESIUM: Magnesium: 1.8 mg/dL (ref 1.7–2.4)

## 2022-05-30 MED ORDER — SODIUM CHLORIDE 0.9 % IV BOLUS
500.0000 mL | Freq: Once | INTRAVENOUS | Status: AC
  Administered 2022-05-30: 500 mL via INTRAVENOUS

## 2022-05-30 MED ORDER — AMIODARONE IV BOLUS ONLY 150 MG/100ML
150.0000 mg | Freq: Once | INTRAVENOUS | Status: AC
  Administered 2022-05-30: 150 mg via INTRAVENOUS
  Filled 2022-05-30: qty 100

## 2022-05-30 MED ORDER — METOPROLOL TARTRATE 5 MG/5ML IV SOLN
2.5000 mg | Freq: Once | INTRAVENOUS | Status: AC
  Administered 2022-05-30: 2.5 mg via INTRAVENOUS
  Filled 2022-05-30: qty 5

## 2022-05-30 MED ORDER — HEPARIN (PORCINE) 25000 UT/250ML-% IV SOLN
750.0000 [IU]/h | INTRAVENOUS | Status: DC
  Administered 2022-05-31: 750 [IU]/h via INTRAVENOUS
  Filled 2022-05-30: qty 250

## 2022-05-30 MED ORDER — HEPARIN BOLUS VIA INFUSION
4000.0000 [IU] | Freq: Once | INTRAVENOUS | Status: AC
  Administered 2022-05-31: 4000 [IU] via INTRAVENOUS
  Filled 2022-05-30: qty 4000

## 2022-05-30 NOTE — ED Provider Notes (Signed)
[facility] Provider Note    Event Date/Time   First MD Initiated Contact with Patient 05/30/22 1947     (approximate)   History   Tachycardia   HPI  Brenda Cardenas is a 88 y.o. female  with past medical history of SVT here with palpitations.  The patient states that earlier today, she began to develop acute palpitations and shortness of breath.  She felt like her heart was racing.  She did not have any chest pain.  She has a history of SVT with similar presentations.  She currently follows up with Dr. Quentin Ore and actually was recently decreased from amiodarone twice a day to once a day over the last several days.  She states that this had been improving her symptoms.  She also was recently adjusted on her metoprolol dose.  Denies history of A-fib and states that she had had SVT.  No recent illnesses.  No caffeine or alcohol use.  No other complaints.  No current chest pain, only palpitations and mild shortness of breath with exertion.      Physical Exam   Triage Vital Signs: ED Triage Vitals  Enc Vitals Group     BP 05/30/22 1930 (!) 148/96     Pulse Rate 05/30/22 1930 70     Resp 05/30/22 1935 (!) 23     Temp --      Temp src --      SpO2 05/30/22 1930 97 %     Weight --      Height --      Head Circumference --      Peak Flow --      Pain Score 05/30/22 1942 0     Pain Loc --      Pain Edu? --      Excl. in [place]? --     Most recent vital signs: Vitals:   05/31/22 0130 05/31/22 0200  BP: (!) 130/91 124/79  Pulse: (!) 129 (!) 129  Resp: 18 (!) 21  Temp:    SpO2: 99% 97%     General: Awake, no distress.  CV:  Good peripheral perfusion.  Tachycardic, regular. Resp:  Normal effort.  Abd:  No distention.  Other:  No lower extremity edema.   ED Results / Procedures / Treatments   Labs (all labs ordered are listed, but only abnormal results are displayed) Labs Reviewed  CBC WITH DIFFERENTIAL/PLATELET - Abnormal; Notable for the  following components:      Result Value   RBC 3.17 (*)    Hemoglobin 10.0 (*)    HCT 31.3 (*)    RDW 19.9 (*)    nRBC 0.3 (*)    All other components within normal limits  COMPREHENSIVE METABOLIC PANEL - Abnormal; Notable for the following components:   Glucose, Bld 135 (*)    Calcium 8.7 (*)    Total Protein 6.2 (*)    Anion gap 4 (*)    All other components within normal limits  BRAIN NATRIURETIC PEPTIDE - Abnormal; Notable for the following components:   B Natriuretic Peptide 137.6 (*)    All other components within normal limits  URINALYSIS, ROUTINE W REFLEX MICROSCOPIC - Abnormal; Notable for the following components:   Color, Urine COLORLESS (*)    APPearance CLEAR (*)    Specific Gravity, Urine 1.003 (*)    Hgb urine dipstick SMALL (*)    Bacteria, UA RARE (*)    All other components within normal limits  MAGNESIUM  TSH  APTT  PROTIME-INR  COMPREHENSIVE METABOLIC PANEL  CBC  HEPARIN LEVEL (UNFRACTIONATED)     EKG Atrial fibrillation, ventricular rate 128.  QRS 129, QTc 494.  Irregular rhythm.  When compared to prior, A-fib appears to have replaced SVT.  No ST elevations or depressions.   RADIOLOGY    I also independently reviewed and agree with radiologist interpretations.   PROCEDURES:  Critical Care performed: Yes, see critical care procedure note(s)  .Critical Care  Performed by: Duffy Bruce, MD Authorized by: Duffy Bruce, MD   Critical care provider statement:    Critical care time (minutes):  30   Critical care time was exclusive of:  Separately billable procedures and treating other patients   Critical care was necessary to treat or prevent imminent or life-threatening deterioration of the following conditions:  Cardiac failure, respiratory failure and circulatory failure   Critical care was time spent personally by me on the following activities:  Development of treatment plan with patient or surrogate, discussions with consultants,  evaluation of patient's response to treatment, examination of patient, ordering and review of laboratory studies, ordering and review of radiographic studies, ordering and performing treatments and interventions, pulse oximetry, re-evaluation of patient's condition and review of old [place] [facility]: Medications  heparin ADULT infusion 100 units/mL (25000 units/264m) (750 Units/hr Intravenous New Bag/Given 05/31/22 0127)  amiodarone (PACERONE) tablet 200 mg (has no administration in time range)  acetaminophen (TYLENOL) tablet 650 mg (has no administration in time range)  ondansetron (ZOFRAN-ODT) disintegrating tablet 4 mg (has no administration in time range)  metoprolol succinate (TOPROL-XL) 24 hr tablet 25 mg (has no administration in time range)  ezetimibe (ZETIA) tablet 10 mg (has no administration in time range)  metoprolol tartrate (LOPRESSOR) injection 2.5 mg (2.5 mg Intravenous Not Given 05/31/22 0128)  amiodarone (NEXTERONE) IV bolus only 150 mg/100 mL (0 mg Intravenous Stopped 05/30/22 2154)  metoprolol tartrate (LOPRESSOR) injection 2.5 mg (2.5 mg Intravenous Given 05/30/22 2333)  sodium chloride 0.9 % bolus 500 mL (0 mLs Intravenous Stopped 05/31/22 0102)  heparin bolus via infusion 4,000 Units (4,000 Units Intravenous Bolus from Bag 05/31/22 0127)     IMPRESSION / MDM / [place]/ ED COURSE  I reviewed the triage vital signs and the nursing notes.                               The patient is on the cardiac monitor to evaluate for evidence of arrhythmia and/or significant heart rate changes.   Ddx:  Differential includes the following, with pertinent life- or limb-threatening emergencies considered:  Recurrent SVT, AFib, sinus tachycardia from anemia/occult infection or other etiology, ACS, PE  Patient's presentation is most consistent with acute presentation with potential threat to life or bodily function.  MDM:  88yo F with PMHx SVT followed  by Dr. LQuentin Oreand Dr. ESaunders Revelhere with tachycardia, palpitations. On arrival, pt in what appears to be SVT with rate 170s. Records reviewed extensively and case discussed with Dr. ROval Linseyof cardiology. IV Amio 150 mg given with reduction to 130s but what appears to be AFIb now. Pt is not on anticoagulation. Pt has been dx with SVT but unknown if she has formally had AFib previously.   Pt remains tachycardic after IV lopressor and IV admiodraone. Will admit to medicine. HR gradually improving, however, and she is o/w stable. CBC shows  no leukocytosis, mild anemia. CMP unremarkable    MEDICATIONS GIVEN IN ED: Medications  heparin ADULT infusion 100 units/mL (25000 units/239m) (750 Units/hr Intravenous New Bag/Given 05/31/22 0127)  amiodarone (PACERONE) tablet 200 mg (has no administration in time range)  acetaminophen (TYLENOL) tablet 650 mg (has no administration in time range)  ondansetron (ZOFRAN-ODT) disintegrating tablet 4 mg (has no administration in time range)  metoprolol succinate (TOPROL-XL) 24 hr tablet 25 mg (has no administration in time range)  ezetimibe (ZETIA) tablet 10 mg (has no administration in time range)  metoprolol tartrate (LOPRESSOR) injection 2.5 mg (2.5 mg Intravenous Not Given 05/31/22 0128)  amiodarone (NEXTERONE) IV bolus only 150 mg/100 mL (0 mg Intravenous Stopped 05/30/22 2154)  metoprolol tartrate (LOPRESSOR) injection 2.5 mg (2.5 mg Intravenous Given 05/30/22 2333)  sodium chloride 0.9 % bolus 500 mL (0 mLs Intravenous Stopped 05/31/22 0102)  heparin bolus via infusion 4,000 Units (4,000 Units Intravenous Bolus from Bag 05/31/22 0127)     Consults:  Case discussed with Dr. ROval Linseyof Cardiology Hospitalist consulted for admission   EMR reviewed  Extensively reviewed Cardiology notes, both EP and primary cardiologist     FINAL CLINICAL IMPRESSION(S) / ED DIAGNOSES   Final diagnoses:  [place]  [place]ricular [place]      Rx / DC Orders   ED Discharge Orders          Ordered    Amb referral to AFIB Clinic        05/31/22 0104             Note:  This document was prepared using Dragon voice recognition software and may include unintentional dictation errors.   IDuffy Bruce MD 05/31/22 [id number]

## 2022-05-30 NOTE — ED Notes (Signed)
Multiple attempts made to obtain EKG.  Patient using cell phone to call family.  Informed patient of need for EKG.  Once monitoring equipment placed, pt requesting to use restroom.

## 2022-05-30 NOTE — Hospital Course (Signed)
  05/13/22 2 D Echo: Left ventricular ejection fraction, by estimation, is 55 to 60%. The left ventricle has normal function. The left ventricle has no regional wall motion abnormalities. Left ventricular diastolic parameters are consistent with Grade II diastolic dysfunction (pseudonormalization). 1. Right ventricular systolic function is normal. The right ventricular size is not well visualized. 2. 3. The mitral valve is normal in structure. Mild mitral valve regurgitation. The aortic valve was not well visualized. Aortic valve regurgitation is not visualized. Aortic valve sclerosis is present, with no evidence of aortic valve stenosis. 4. The inferior vena cava is normal in size with greater than 50% respiratory variability, suggesting right atrial pressure of 3 mmHg.

## 2022-05-30 NOTE — Progress Notes (Signed)
ANTICOAGULATION CONSULT NOTE - Initial Consult  Pharmacy Consult for Heparin  Indication: AFib,  NSTEMI ?   Allergies  Allergen Reactions   Ciprofloxacin Diarrhea   Clindamycin/Lincomycin Diarrhea   Sulfa Antibiotics Rash    Rash in throat   Augmentin [Amoxicillin-Pot Clavulanate] Diarrhea   Gabapentin Other (See Comments)    Irregular heartbeat   Naproxen Sodium Other (See Comments)    Causes gastritis   Prednisone     Atrial Fibrillation   Tramadol Diarrhea    "upset stomach, headache, dizziness, drowsiness"   Codeine Nausea Only and Rash    Upset stomach   Doxycycline Hyclate Nausea And Vomiting and Nausea Only    Dry heaves.    E-Mycin [Erythromycin] Rash   Macrobid [Nitrofurantoin] Rash    severe   Nabumetone Rash and Other (See Comments)    Upsets liver enzymes   Nitrofurantoin Monohyd Macro Rash   Nsaids Rash    Gastritis, GI ulceration.    Phenobarbital Other (See Comments), Rash and Anxiety    "got wild" Patient becomes very hyper and anxious   Statins Rash    Upsets liver enzymes    Patient Measurements:   Heparin Dosing Weight: 62.3 kg   Vital Signs: Temp: 97.8 F (36.6 C) (09/02 2330) Temp Source: Oral (09/02 2330) BP: 133/99 (09/02 2330) Pulse Rate: 129 (09/02 2330)  Labs: Recent Labs    05/28/22 0832 05/30/22 2014  HGB 11.2* 10.0*  HCT 33.2* 31.3*  PLT 167.0 170  CREATININE 0.83 0.81    Estimated Creatinine Clearance: 43.2 mL/min (by C-G formula based on SCr of 0.81 mg/dL).   Medical History: Past Medical History:  Diagnosis Date   Arthritis    Basal cell carcinoma 06/04/2010   left upper back   Chronic kidney disease    Colon polyps    Gastritis    GERD (gastroesophageal reflux disease)    Hiatal hernia    Hyperlipidemia    Hypertension    IBS (irritable bowel syndrome)    Low back pain radiating to left leg 03/28/2020   Sciatica, left side 04/15/2020   Squamous cell carcinoma of skin 06/26/2013   left mid pretibial    Squamous cell carcinoma of skin 08/21/2015   lower sternum   Squamous cell carcinoma of skin 10/14/2017   right mid pretibial   Squamous cell carcinoma of skin 03/17/2018   right prox lateral elbow   Squamous cell carcinoma of skin 09/07/2018   right post thigh   Squamous cell carcinoma of skin 05/11/2019   right mid med pretibial   Squamous cell carcinoma of skin 06/20/2019   right bicep    Medications:  (Not in a hospital admission)   Assessment: Pharmacy consulted to dose heparin for NSTEMI/Afib (?) in this 88 year old female.  No prior anticoag noted.   - spoke with RN regarding warning about epidural,  she states pt had epidural back in 01/2022 but not since. CrCl = 43.2 ml/min  Goal of Therapy:  Heparin level 0.3-0.7 units/ml Monitor platelets by anticoagulation protocol: Yes   Plan:  Give 3700 units bolus x 1 Start heparin infusion at 750 units/hr Check anti-Xa level in 8 hours and daily while on heparin Continue to monitor H&H and platelets  Cy Bresee D 05/30/2022,11:37 PM

## 2022-05-30 NOTE — H&P (Signed)
History and Physical    [name] VOZ:[id number] DOB: Mar 20, 1932 DOA: 05/30/2022  PCP: Crecencio Mc, MD    Patient coming from:  Home    Chief Complaint:  Palpitation    HPI:  Brenda Cardenas is a 88 y.o. female seen in ed for heart palpitations., Initial ekg shows tachycardia, SVT and then ekg shows a.fib with heart in 120's. HPI is difficult to get from patient as she drifts into multiple tangents and has hearing loss.  Reviewed all her ekg but are regular and not sure that this is a.fib.\ D/w pt that she is anemic and putting her on heparin puts her at risk for bleeding. Pt verbalized understanding and pt wises to proceed.  Pt has past medical history as below.   Past Medical History:  Diagnosis Date   Arthritis    Basal cell carcinoma 06/04/2010   left upper back   Chronic kidney disease    Colon polyps    Gastritis    GERD (gastroesophageal reflux disease)    Hiatal hernia    Hyperlipidemia    Hypertension    IBS (irritable bowel syndrome)    Low back pain radiating to left leg 03/28/2020   Sciatica, left side 04/15/2020   Squamous cell carcinoma of skin 06/26/2013   left mid pretibial   Squamous cell carcinoma of skin 08/21/2015   lower sternum   Squamous cell carcinoma of skin 10/14/2017   right mid pretibial   Squamous cell carcinoma of skin 03/17/2018   right prox lateral elbow   Squamous cell carcinoma of skin 09/07/2018   right post thigh   Squamous cell carcinoma of skin 05/11/2019   right mid med pretibial   Squamous cell carcinoma of skin 06/20/2019   right bicep    Past Surgical History:  Procedure Laterality Date   APPENDECTOMY  1959   CARPAL TUNNEL RELEASE     CATARACT EXTRACTION W/ INTRAOCULAR LENS IMPLANT Left    CATARACT EXTRACTION W/PHACO Right 04/20/2018   Procedure: CATARACT EXTRACTION PHACO AND INTRAOCULAR LENS PLACEMENT ([facility])  RIGHT;  Surgeon: Leandrew Koyanagi, MD;  Location: [facility];  Service: Ophthalmology;   Laterality: Right;   CHOLECYSTECTOMY  1995   EYE SURGERY     GANGLION CYST EXCISION     JOINT REPLACEMENT Right 03/2008   [facility]    KNEE ARTHROSCOPY     LITHOTRIPSY     SALIVARY GLAND SURGERY     SKIN CANCER EXCISION     TONSILLECTOMY     TONSILLECTOMY AND ADENOIDECTOMY     VEIN LIGATION       reports that she has never smoked. She has never used smokeless tobacco. She reports that she does not drink alcohol and does not use drugs.  Allergies  Allergen Reactions   Ciprofloxacin Diarrhea   Clindamycin/Lincomycin Diarrhea   Sulfa Antibiotics Rash    Rash in throat   Augmentin [Amoxicillin-Pot Clavulanate] Diarrhea   Gabapentin Other (See Comments)    Irregular heartbeat   Naproxen Sodium Other (See Comments)    Causes gastritis   Prednisone     Atrial Fibrillation   Tramadol Diarrhea    "upset stomach, headache, dizziness, drowsiness"   Codeine Nausea Only and Rash    Upset stomach   Doxycycline Hyclate Nausea And Vomiting and Nausea Only    Dry heaves.    E-Mycin [Erythromycin] Rash   Macrobid [Nitrofurantoin] Rash    severe   Nabumetone Rash and Other (See Comments)    Upsets  liver enzymes   Nitrofurantoin Monohyd Macro Rash   Nsaids Rash    Gastritis, GI ulceration.    Phenobarbital Other (See Comments), Rash and Anxiety    "got wild" Patient becomes very hyper and anxious   Statins Rash    Upsets liver enzymes    Family History  Problem Relation Age of Onset   Heart disease Mother    Arthritis Mother    Diabetes Father     Prior to Admission medications   Medication Sig Start Date End Date Taking? Authorizing Provider  acetaminophen (TYLENOL) 325 MG tablet Take 650 mg by mouth every 6 (six) hours as needed.    [provider]  amiodarone (PACERONE) 200 MG tablet Take 1 tablet (200 mg total) by mouth daily. 05/28/22   Vickie Epley, MD  Calcium Citrate-Vitamin D (CITRACAL + D PO) Take 2 capsules by mouth every other day. Alternates with  Vitamin D3 and centrum silver    [provider]  Cholecalciferol (VITAMIN D) 50 MCG (2000 UT) CAPS Take 1 capsule by mouth every other day.    [provider]  clobetasol ointment (TEMOVATE) 1.96 % Apply 1 Application topically 2 (two) times daily. 05/13/22   Crecencio Mc, MD  CRANBERRY PO Take 2 capsules by mouth daily.     [provider]  estradiol (ESTRACE) 0.1 MG/GM vaginal cream 1 Applicatorful every other day.    [provider]  ezetimibe (ZETIA) 10 MG tablet Take 1 tablet (10 mg total) by mouth daily. 01/13/22   Crecencio Mc, MD  hyoscyamine (ANASPAZ) 0.125 MG TBDP disintergrating tablet Place 1 tablet (0.125 mg total) under the tongue every 6 (six) hours as needed (as needed for esophageal spasm). 01/11/20   Crecencio Mc, MD  metoprolol succinate (TOPROL XL) 25 MG 24 hr tablet Take 1 tablet (25 mg total) by mouth daily. 05/20/22   Vickie Epley, MD  omeprazole (PRILOSEC) 20 MG capsule TAKE 1 CAPSULE BY MOUTH EVERY DAY 02/19/22   Crecencio Mc, MD  ondansetron (ZOFRAN ODT) 4 MG disintegrating tablet Take 1 tablet (4 mg total) by mouth every 8 (eight) hours as needed for nausea or vomiting. 07/04/21   Crecencio Mc, MD  Probiotic Product (ALIGN PO) Take 1 tablet by mouth daily.     [provider]    Review of Systems:  Review of Systems  Constitutional: Negative.   HENT: Negative.    Eyes: Negative.   Respiratory: Negative.    Cardiovascular:  Positive for palpitations.  Gastrointestinal: Negative.   Genitourinary: Negative.   Musculoskeletal: Negative.   Skin: Negative.   Neurological: Negative.   Psychiatric/Behavioral:  The patient is nervous/anxious.   All other systems reviewed and are negative.    ED Course:   > Vitals:   05/30/22 2200 05/30/22 2300 05/30/22 2330 05/31/22 0030  BP: 117/82 122/85 (!) 133/99 116/84  Pulse: (!) 122 (!) 124 (!) 129 (!) 128  Resp: (!) '21 20 15 '$ (!) 26  Temp:   97.8 F (36.6 C)    TempSrc:   Oral   SpO2: 98% 99% 99% 98%   > Vitals:   05/30/22 1930 05/30/22 1935 05/30/22 2020 05/30/22 2100  BP: (!) 148/96 125/71 119/86 127/85   05/30/22 2130 05/30/22 2200 05/30/22 2300 05/30/22 2330  BP: 113/82 117/82 122/85 (!) 133/99   05/31/22 0030  BP: 116/84    >No intake/output data recorded. >Total I/O In: 600 [I.V.:100; IV Piggyback:500] Out: -  >SpO2:  98 %  In ed pt is A/O and stable ,afebrile. Labs shows glucose 135, normal LFTs, BNP at 137.6, troponins pending. CBC shows anemia with a hemoglobin of 10 from 11.2 few days ago, normal white count of 10.3 and platelets of 170. Urinalysis shows negative nitrite 0-5 WBCs small hemoglobin clear in color urine.  Results for orders placed or performed during the hospital encounter of 05/30/22 (from the past 24 hour(s))  CBC with Differential     Status: Abnormal   Collection Time: 05/30/22  8:14 PM  Result Value Ref Range   WBC 10.3 4.0 - 10.5 K/uL   RBC 3.17 (L) 3.87 - 5.11 MIL/uL   Hemoglobin 10.0 (L) 12.0 - 15.0 g/dL   HCT 31.3 (L) 36.0 - 46.0 %   MCV 98.7 80.0 - 100.0 fL   MCH 31.5 26.0 - 34.0 pg   MCHC 31.9 30.0 - 36.0 g/dL   RDW 19.9 (H) 11.5 - 15.5 %   Platelets 170 150 - 400 K/uL   nRBC 0.3 (H) 0.0 - 0.2 %   Neutrophils Relative % 69 %   Neutro Abs 7.1 1.7 - 7.7 K/uL   Lymphocytes Relative 22 %   Lymphs Abs 2.2 0.7 - 4.0 K/uL   Monocytes Relative 5 %   Monocytes Absolute 0.5 0.1 - 1.0 K/uL   Eosinophils Relative 2 %   Eosinophils Absolute 0.2 0.0 - 0.5 K/uL   Basophils Relative 1 %   Basophils Absolute 0.1 0.0 - 0.1 K/uL   Immature Granulocytes 1 %   Abs Immature Granulocytes 0.07 0.00 - 0.07 K/uL  Comprehensive metabolic panel     Status: Abnormal   Collection Time: 05/30/22  8:14 PM  Result Value Ref Range   Sodium 139 135 - 145 mmol/L   Potassium 3.5 3.5 - 5.1 mmol/L   Chloride 109 98 - 111 mmol/L   CO2 26 22 - 32 mmol/L   Glucose, Bld 135 (H) 70 - 99 mg/dL   BUN 20 8 - 23 mg/dL    Creatinine, Ser 0.81 0.44 - 1.00 mg/dL   Calcium 8.7 (L) 8.9 - 10.3 mg/dL   Total Protein 6.2 (L) 6.5 - 8.1 g/dL   Albumin 3.5 3.5 - 5.0 g/dL   AST 20 15 - 41 U/L   ALT 13 0 - 44 U/L   Alkaline Phosphatase 50 38 - 126 U/L   Total Bilirubin 0.7 0.3 - 1.2 mg/dL   GFR, Estimated >60 >60 mL/min   Anion gap 4 (L) 5 - 15  Brain natriuretic peptide     Status: Abnormal   Collection Time: 05/30/22  8:14 PM  Result Value Ref Range   B Natriuretic Peptide 137.6 (H) 0.0 - 100.0 pg/mL  Magnesium     Status: None   Collection Time: 05/30/22  8:14 PM  Result Value Ref Range   Magnesium 1.8 1.7 - 2.4 mg/dL  Urinalysis, Routine w reflex microscopic     Status: Abnormal   Collection Time: 05/30/22  8:15 PM  Result Value Ref Range   Color, Urine COLORLESS (A) YELLOW   APPearance CLEAR (A) CLEAR   Specific Gravity, Urine 1.003 (L) 1.005 - 1.030   pH 7.0 5.0 - 8.0   Glucose, UA NEGATIVE NEGATIVE mg/dL   Hgb urine dipstick SMALL (A) NEGATIVE   Bilirubin Urine NEGATIVE NEGATIVE   Ketones, ur NEGATIVE NEGATIVE mg/dL   Protein, ur NEGATIVE NEGATIVE mg/dL   Nitrite NEGATIVE NEGATIVE   Leukocytes,Ua NEGATIVE NEGATIVE   RBC /  HPF 0-5 0 - 5 RBC/hpf   WBC, UA 0-5 0 - 5 WBC/hpf   Bacteria, UA RARE (A) NONE SEEN   Squamous Epithelial / LPF NONE SEEN 0 - 5  TSH     Status: None   Collection Time: 05/30/22 11:27 PM  Result Value Ref Range   TSH 2.277 0.350 - 4.500 uIU/mL  APTT     Status: None   Collection Time: 05/30/22 11:27 PM  Result Value Ref Range   aPTT 30 24 - 36 seconds  Protime-INR     Status: None   Collection Time: 05/30/22 11:27 PM  Result Value Ref Range   Prothrombin Time 13.7 11.4 - 15.2 seconds   INR 1.1 0.8 - 1.2    EKG: Independently reviewed.  Shows A-fib RVR. See images below   Radiological Exams on Admission: No results found.     Physical Exam: Vitals:   05/30/22 2200 05/30/22 2300 05/30/22 2330 05/31/22 0030  BP: 117/82 122/85 (!) 133/99 116/84  Pulse: (!) 122  (!) 124 (!) 129 (!) 128  Resp: (!) '21 20 15 '$ (!) 26  Temp:   97.8 F (36.6 C)   TempSrc:   Oral   SpO2: 98% 99% 99% 98%   Physical Exam Vitals and nursing note reviewed.  Constitutional:      General: She is not in acute distress.    Appearance: Normal appearance. She is not ill-appearing, toxic-appearing or diaphoretic.  HENT:     Head: Normocephalic and atraumatic.     Right Ear: Hearing and external ear normal.     Left Ear: Hearing and external ear normal.     Nose: Nose normal. No nasal deformity.     Mouth/Throat:     Lips: Pink.     Mouth: Mucous membranes are moist.     Tongue: No lesions.     Pharynx: Oropharynx is clear.  Eyes:     Extraocular Movements: Extraocular movements intact.  Cardiovascular:     Rate and Rhythm: Regular rhythm. Tachycardia present.     Pulses: Normal pulses.     Heart sounds: Normal heart sounds.  Pulmonary:     Effort: Pulmonary effort is normal.     Breath sounds: Normal breath sounds.  Abdominal:     General: Bowel sounds are normal. There is no distension.     Palpations: Abdomen is soft. There is no mass.     Tenderness: There is no abdominal tenderness. There is no guarding.     Hernia: No hernia is present.  Musculoskeletal:     Right lower leg: Edema present.     Left lower leg: Edema present.  Skin:    General: Skin is warm.     Findings: Bruising present.  Neurological:     General: No focal deficit present.     Mental Status: She is alert and oriented to person, place, and time.     Cranial Nerves: Cranial nerves 2-12 are intact.     Motor: Motor function is intact.  Psychiatric:        Attention and Perception: Attention normal.        Mood and Affect: Mood normal.        Speech: Speech normal.        Behavior: Behavior normal. Behavior is cooperative.        Cognition and Memory: Cognition normal.     Patient Active Problem List   Diagnosis Date Noted   Atrial fibrillation with RVR ([place]) 05/31/2022  Priority:  1.   Essential hypertension 12/24/2015    Priority: 2.   GAD (generalized anxiety disorder) 11/05/2020    Priority: 3.   Anemia 05/31/2022   Palpitations 96/28/3662   Atrophic lichen planus 94/76/5465   Supraventricular tachycardia ([place]) 08/13/2021   Left ventricular diastolic dysfunction 03/54/6568    Grade 2 by Nov 2021 ECHO    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   Aortic atherosclerosis ([place]) 07/08/2020   Primary osteoarthritis of left knee 04/21/2020   Sciatica, left side 04/15/2020   BPPV (benign paroxysmal positional vertigo) 03/14/2019   Educated about COVID-19 virus infection 03/14/2019   Hyperlipidemia LDL goal <130 06/20/2018   Varicose veins of bilateral lower extremities with other complications 12/75/1700   Constipation 07/14/2016   Hypovitaminosis D 06/23/2016   Venous stasis of both lower extremities 06/23/2016   History of colonic polyps 12/24/2015   Spinal stenosis of lumbar region 08/13/2015   Idiopathic scoliosis 06/24/2015   Rhinitis due food 12/17/2014   Osteoarthritis of cervical spine 02/25/2014   Osteoporosis 06/18/2013    History of lumbar compression fracture prior to initiation of therapy with  Prolia in Nov 2014 Was treated with Evista prior to 2014 Last DEXA  2021:  T scores stable.     Encounter for Medicare annual wellness exam 06/18/2013   Hiatal hernia 06/16/2013   Diaphragmatic hernia 06/16/2013    Formatting of this note might be different from the original. Last Assessment & Plan:  Minimal by recent EGD. Discussed ways to keep hiatal hernia asymptomatic which includes eating smaller more frequent meals and not lying down after eating. No surgery indicated. Formatting of this note might be different from the original. Overview:  Last Assessment & Plan:  Minimal by recent EGD. Discussed ways to keep hiatal hernia asymptomatic which includes eating smaller more frequent meals and not lying down after eating. No surgery  indicated.    GERD (gastroesophageal reflux disease) 05/02/2013    History of gastric polyps    S/P total knee replacement 05/02/2013    By [facility].    Artificial knee joint present 05/02/2013    Formatting of this note might be different from the original. Overview:  Overview:  By [facility].  Last Assessment & Plan:  Excellent results.  continue pre procedure abx prophylaxis and encouraged to exercise.    History of urinary stone 11/03/2012   Atrial fibrillation with RVR ([facility]) EKG shows a.fib rvr.  We will also get cardiology consult for her a.fib .Will continue heparin . Am team to continue if pt in a.fib overnight and after d/w cardiology for pt's careplan. She is a fall risk and has fallen in July last.    Essential hypertension We will continue pt on amiodarone and metoprolol.  cont heparin infusion ordered for full dose anticoagulation.  Pt sees [facility] cardiology and consult in am as needed.   GAD (generalized anxiety disorder) No anxiety meds. We will follow and may need antianxiety medication.  Anemia    Latest Ref Rng & Units 05/30/2022    8:14 PM 05/28/2022    8:32 AM 04/08/2022   12:04 PM  CBC  WBC 4.0 - 10.5 K/uL 10.3  8.2  15.2   Hemoglobin 12.0 - 15.0 g/dL 10.0  11.2  12.3   Hematocrit 36.0 - 46.0 % 31.3  33.2  38.1   Platelets 150 - 400 K/uL 170  167.0  213    We will follow. Type and screen. IV PPI.     Unresulted  Labs (From admission, onward)     Start     Ordered   05/31/22 0500  Comprehensive metabolic panel  Tomorrow morning,   STAT        05/31/22 0104   05/31/22 0500  CBC  Tomorrow morning,   STAT        05/31/22 0104           DVT prophylaxis:  Heparin gtt.    Code Status:  Full code    Family Communication:  Day,Barbara (Other)  [phone] (Mobile)   Disposition Plan:  Home    Consults called:  None    Admission status: Observation.    Unit: Med tele.   Para Skeans MD [facility]  6 PM- 2 AM. Please  contact me via secure Chat 6 PM-2 AM. [phone] ( Pager ) To contact the [facility] Attending or Consulting provider [place] or covering provider during after hours [place], for this patient.   Check the care team in [facility] and look for a) attending/consulting [facility] provider listed and b) the [facility] team listed Log into [email or web address] and use [place]'s universal password to access. If you do not have the password, please contact the hospital operator. Locate the [facility] provider you are looking for under [facility] and page to a number that you can be directly reached. If you still have difficulty reaching the provider, please page the [facility] (Director on Call) for the Hospitalists listed on amion for assistance. [email or web address] 05/31/2022, 1:08 AM

## 2022-05-30 NOTE — ED Triage Notes (Signed)
Patient BIB EMS for evaluation of increased HR.  Reports hx of SVT.  Has decreased medication doses per MD order 3 days ago.  Feels like HR has increased.  No reports of pain or SHOB.

## 2022-05-31 ENCOUNTER — Emergency Department: Payer: Medicare Other

## 2022-05-31 ENCOUNTER — Encounter: Payer: Self-pay | Admitting: Internal Medicine

## 2022-05-31 DIAGNOSIS — I10 Essential (primary) hypertension: Secondary | ICD-10-CM

## 2022-05-31 DIAGNOSIS — I471 Supraventricular tachycardia: Secondary | ICD-10-CM | POA: Diagnosis not present

## 2022-05-31 DIAGNOSIS — R002 Palpitations: Secondary | ICD-10-CM | POA: Diagnosis not present

## 2022-05-31 DIAGNOSIS — D649 Anemia, unspecified: Secondary | ICD-10-CM | POA: Diagnosis present

## 2022-05-31 LAB — COMPREHENSIVE METABOLIC PANEL
ALT: 14 U/L (ref 0–44)
AST: 19 U/L (ref 15–41)
Albumin: 3.6 g/dL (ref 3.5–5.0)
Alkaline Phosphatase: 50 U/L (ref 38–126)
Anion gap: 6 (ref 5–15)
BUN: 16 mg/dL (ref 8–23)
CO2: 26 mmol/L (ref 22–32)
Calcium: 8.6 mg/dL — ABNORMAL LOW (ref 8.9–10.3)
Chloride: 107 mmol/L (ref 98–111)
Creatinine, Ser: 0.72 mg/dL (ref 0.44–1.00)
GFR, Estimated: >60 mL/min (ref >60)
Glucose, Bld: 113 mg/dL — ABNORMAL HIGH (ref 70–99)
Potassium: 3.7 mmol/L (ref 3.5–5.1)
Sodium: 139 mmol/L (ref 135–145)
Total Bilirubin: 0.9 mg/dL (ref 0.3–1.2)
Total Protein: 6.4 g/dL — ABNORMAL LOW (ref 6.5–8.1)

## 2022-05-31 LAB — CBC
HCT: 36.5 % (ref 36.0–46.0)
Hemoglobin: 11.7 g/dL — ABNORMAL LOW (ref 12.0–15.0)
MCH: 30.9 pg (ref 26.0–34.0)
MCHC: 32.1 g/dL (ref 30.0–36.0)
MCV: 96.3 fL (ref 80.0–100.0)
Platelets: 184 10*3/uL (ref 150–400)
RBC: 3.79 MIL/uL — ABNORMAL LOW (ref 3.87–5.11)
RDW: 19.8 % — ABNORMAL HIGH (ref 11.5–15.5)
WBC: 14.7 10*3/uL — ABNORMAL HIGH (ref 4.0–10.5)
nRBC: 0.1 % (ref 0.0–0.2)

## 2022-05-31 LAB — APTT: aPTT: 30 seconds (ref 24–36)

## 2022-05-31 LAB — PROTIME-INR
INR: 1.1 (ref 0.8–1.2)
Prothrombin Time: 13.7 seconds (ref 11.4–15.2)

## 2022-05-31 LAB — HEPARIN LEVEL (UNFRACTIONATED): Heparin Unfractionated: 0.41 IU/mL (ref 0.30–0.70)

## 2022-05-31 LAB — TSH: TSH: 2.277 u[IU]/mL (ref 0.350–4.500)

## 2022-05-31 MED ORDER — AMIODARONE HCL 200 MG PO TABS: 200.0000 mg | ORAL_TABLET | Freq: Every day | ORAL | Status: DC

## 2022-05-31 MED ORDER — AMIODARONE HCL 200 MG PO TABS: 200.0000 mg | ORAL_TABLET | Freq: Every day | ORAL | 3 refills | Status: DC

## 2022-05-31 MED ORDER — AMIODARONE HCL 200 MG PO TABS
200.0000 mg | ORAL_TABLET | Freq: Every day | ORAL | Status: DC
  Administered 2022-05-31: 200 mg via ORAL
  Filled 2022-05-31: qty 1

## 2022-05-31 MED ORDER — EZETIMIBE 10 MG PO TABS
10.0000 mg | ORAL_TABLET | Freq: Every day | ORAL | Status: DC
  Administered 2022-05-31: 10 mg via ORAL
  Filled 2022-05-31: qty 1

## 2022-05-31 MED ORDER — METOPROLOL SUCCINATE ER 25 MG PO TB24
25.0000 mg | ORAL_TABLET | Freq: Every day | ORAL | Status: DC
  Administered 2022-05-31: 25 mg via ORAL
  Filled 2022-05-31: qty 1

## 2022-05-31 MED ORDER — AMIODARONE HCL 200 MG PO TABS
200.0000 mg | ORAL_TABLET | Freq: Two times a day (BID) | ORAL | Status: DC
Start: 2022-05-31 — End: 2022-05-31

## 2022-05-31 MED ORDER — AMIODARONE HCL 200 MG PO TABS
200.0000 mg | ORAL_TABLET | Freq: Two times a day (BID) | ORAL | 0 refills | Status: DC
Start: 2022-05-31 — End: 2022-07-01

## 2022-05-31 MED ORDER — ONDANSETRON 4 MG PO TBDP: 4.0000 mg | ORAL_TABLET | Freq: Three times a day (TID) | ORAL | Status: DC | PRN

## 2022-05-31 MED ORDER — ACETAMINOPHEN 325 MG PO TABS: 650.0000 mg | ORAL_TABLET | Freq: Four times a day (QID) | ORAL | Status: DC | PRN

## 2022-05-31 MED ORDER — METOPROLOL TARTRATE 5 MG/5ML IV SOLN
2.5000 mg | INTRAVENOUS | Status: AC
  Filled 2022-05-31: qty 5

## 2022-05-31 NOTE — ED Notes (Signed)
Patient transported to floor.  All personal belongings with patient

## 2022-05-31 NOTE — Assessment & Plan Note (Signed)
We will continue pt on amiodarone and metoprolol.  cont heparin infusion ordered for full dose anticoagulation.  Pt sees [facility] cardiology and consult in am as needed.

## 2022-05-31 NOTE — Progress Notes (Signed)
  Transition of Care [facility]) Screening Note   Patient Details  Name: Brenda Cardenas Date of Birth: 11/20/1931   Transition of Care [facility]) CM/SW Contact:    Alberteen Sam, LCSW Phone Number: 05/31/2022, 2:36 PM    Transition of Care Department [facility]) has reviewed patient and no TOC needs have been identified at this time. We will continue to monitor patient advancement through interdisciplinary progression rounds. If new patient transition needs arise, please place a TOC consult.  [place], [place]

## 2022-05-31 NOTE — Discharge Summary (Signed)
Physician Discharge Summary   Patient: Brenda Cardenas MRN: [id number] DOB: 09-Nov-1931  Admit date:     05/30/2022  Discharge date: 05/31/2022  Discharge Physician: Jennye Boroughs   PCP: Crecencio Mc, MD   Recommendations at discharge:   Follow-up with Dr. Quentin Ore, cardiologist, in 1 week Follow-up with PCP in 1 week  Discharge Diagnoses: Principal Problem:   SVT (supraventricular tachycardia) ([place]) Active Problems:   Essential hypertension   GAD (generalized anxiety disorder)   Anemia   Palpitations  Resolved Problems:   * No resolved hospital problems. [facility] Course:  [name] is an 88 year old woman with medical history significant for SVT, GERD, gastritis, colonic polyps, basal cell carcinoma, squamous cell carcinoma, hypertension, hyperlipidemia, anxiety, IBS, low back pain/sciatica.  She presented to the hospital because of palpitation and elevated heart rate.  She sees Dr. Quentin Ore for SVT.  She had been taking amiodarone 200 mg twice a day and was recently advised to decrease amiodarone to 200 mg once a day.  She was also on metoprolol and the dose of metoprolol was decreased as well.  She was admitted to the hospital for SVT.  Initially there was concern for A-fib with RVR.  She was therefore started on amiodarone and IV heparin drip.  However, she converted to normal sinus rhythm.  Dr. Oval Linsey, cardiologist, was consulted to assist with management.  She said that patient had SVT and not A-fib.  Patient's condition has improved.  She is maintaining normal sinus rhythm and she is okay for discharge from cardiologist's point.       Consultants: Cardiologist Procedures performed: None  Disposition: Home Diet recommendation:  Discharge Diet Orders (From admission, onward)     Start     Ordered   05/31/22 0000  Diet - low sodium heart healthy        05/31/22 1318           Cardiac diet DISCHARGE MEDICATION: Allergies as of 05/31/2022       Reactions    Ciprofloxacin Diarrhea   Clindamycin/lincomycin Diarrhea   Sulfa Antibiotics Rash   Rash in throat   Augmentin [amoxicillin-pot Clavulanate] Diarrhea   Gabapentin Other (See Comments)   Irregular heartbeat   Naproxen Sodium Other (See Comments)   Causes gastritis   Prednisone    Atrial Fibrillation   Tramadol Diarrhea   "upset stomach, headache, dizziness, drowsiness"   Codeine Nausea Only, Rash   Upset stomach   Doxycycline Hyclate Nausea And Vomiting, Nausea Only   Dry heaves.   E-mycin [erythromycin] Rash   Macrobid [nitrofurantoin] Rash   severe   Nabumetone Rash, Other (See Comments)   Upsets liver enzymes   Nitrofurantoin Monohyd Macro Rash   Nsaids Rash   Gastritis, GI ulceration.   Phenobarbital Other (See Comments), Rash, Anxiety   "got wild" Patient becomes very hyper and anxious   Statins Rash   Upsets liver enzymes        Medication List     TAKE these medications    acetaminophen 325 MG tablet Commonly known as: TYLENOL Take 650 mg by mouth every 6 (six) hours as needed.   ALIGN PO Take 1 tablet by mouth daily.   amiodarone 200 MG tablet Commonly known as: PACERONE Take 1 tablet (200 mg total) by mouth 2 (two) times daily for 7 days. What changed: when to take this   amiodarone 200 MG tablet Commonly known as: PACERONE Take 1 tablet (200 mg total) by mouth daily. Start  taking on: June 07, 2022 What changed: You were already taking a medication with the same name, and this prescription was added. Make sure you understand how and when to take each.   CITRACAL + D PO Take 2 capsules by mouth every other day. Alternates with Vitamin D3 and centrum silver   clobetasol ointment 0.05 % Commonly known as: TEMOVATE Apply 1 Application topically 2 (two) times daily. What changed:  when to take this additional instructions   CRANBERRY PO Take 2 capsules by mouth daily.   estradiol 0.1 MG/GM vaginal cream Commonly known as: ESTRACE 1  Applicatorful every other day.   ezetimibe 10 MG tablet Commonly known as: ZETIA Take 1 tablet (10 mg total) by mouth daily.   hyoscyamine 0.125 MG Tbdp disintergrating tablet Commonly known as: ANASPAZ Place 1 tablet (0.125 mg total) under the tongue every 6 (six) hours as needed (as needed for esophageal spasm).   metoprolol succinate 25 MG 24 hr tablet Commonly known as: Toprol XL Take 1 tablet (25 mg total) by mouth daily.   omeprazole 20 MG capsule Commonly known as: PRILOSEC TAKE 1 CAPSULE BY MOUTH EVERY DAY   ondansetron 4 MG disintegrating tablet Commonly known as: Zofran ODT Take 1 tablet (4 mg total) by mouth every 8 (eight) hours as needed for nausea or vomiting.   Vitamin D 50 MCG (2000 UT) Caps Take 1 capsule by mouth every other day.        Follow-up Information     Vickie Epley, MD. Schedule an appointment as soon as possible for a visit in 1 week(s).   Specialties: Cardiology, Radiology Contact information: [address] [place] [address] [phone]                Discharge Exam: Danley Danker Weights   05/31/22 0618  Weight: 61.6 kg   GEN: NAD SKIN: No rash on exposed skin EYES: EOMI ENT: MMM CV: RRR PULM: CTA B ABD: soft, ND, NT, +BS CNS: AAO x 3, non focal EXT: No edema or tenderness   Condition at discharge: good  The results of significant diagnostics from this hospitalization (including imaging, microbiology, ancillary and laboratory) are listed below for reference.   Imaging Studies: DG Chest Portable 1 View  Result Date: 05/31/2022 CLINICAL DATA:  Shortness of breath.  Tachycardia. EXAM: PORTABLE CHEST 1 VIEW COMPARISON:  Chest radiograph dated 04/08/2022. FINDINGS: No focal consolidation, pleural effusion or pneumothorax. Mild diffuse chronic interstitial coarsening and bronchitic changes. The cardiac silhouette is within limits. Small hiatal hernia. Atherosclerotic calcification of the aorta. No acute  osseous pathology. IMPRESSION: 1. No active cardiopulmonary disease. 2. Small hiatal hernia. Electronically Signed   By: Anner Crete M.D.   On: 05/31/2022 01:24   ECHOCARDIOGRAM COMPLETE  Result Date: 05/13/2022    ECHOCARDIOGRAM REPORT   Patient Name:   Brenda Cardenas Date of Exam: 05/13/2022 Medical Rec #:  [id number]     Height:       65.0 in Accession #:    [id number]    Weight:       135.0 lb Date of Birth:  [birth year]     BSA:          1.674 m Patient Age:    88 years      BP:           110/78 mmHg Patient Gender: F             HR:  68 bpm. Exam Location:  [facility] Procedure: 2D Echo, Cardiac Doppler and Color Doppler Indications:    I47.1 SVT  History:        Patient has prior history of Echocardiogram examinations, most                 recent 08/20/2020. LVH; Risk Factors:Hypertension, Dyslipidemia                 and Non-Smoker. Demand ischemia.  Sonographer:    Pilar Jarvis RDMS, RVT, RDCS Referring Phys: [id number] [facility]  Sonographer Comments: Technically difficult study due to poor echo windows. Simpson's measurements not obtained due to obvious foreshortening IMPRESSIONS  1. Left ventricular ejection fraction, by estimation, is 55 to 60%. The left ventricle has normal function. The left ventricle has no regional wall motion abnormalities. Left ventricular diastolic parameters are consistent with Grade II diastolic dysfunction (pseudonormalization).  2. Right ventricular systolic function is normal. The right ventricular size is not well visualized.  3. The mitral valve is normal in structure. Mild mitral valve regurgitation.  4. The aortic valve was not well visualized. Aortic valve regurgitation is not visualized. Aortic valve sclerosis is present, with no evidence of aortic valve stenosis.  5. The inferior vena cava is normal in size with greater than 50% respiratory variability, suggesting right atrial pressure of 3 mmHg. FINDINGS  Left Ventricle: Left ventricular ejection fraction,  by estimation, is 55 to 60%. The left ventricle has normal function. The left ventricle has no regional wall motion abnormalities. The left ventricular internal cavity size was normal in size. There is  no left ventricular hypertrophy. Left ventricular diastolic parameters are consistent with Grade II diastolic dysfunction (pseudonormalization). Right Ventricle: The right ventricular size is not well visualized. No increase in right ventricular wall thickness. Right ventricular systolic function is normal. Left Atrium: Left atrial size was normal in size. Right Atrium: Right atrial size was not well visualized. Pericardium: There is no evidence of pericardial effusion. Mitral Valve: The mitral valve is normal in structure. Mild mitral valve regurgitation. Tricuspid Valve: The tricuspid valve is normal in structure. Tricuspid valve regurgitation is not demonstrated. Aortic Valve: The aortic valve was not well visualized. Aortic valve regurgitation is not visualized. Aortic valve sclerosis is present, with no evidence of aortic valve stenosis. Aortic valve mean gradient measures 2.0 mmHg. Aortic valve peak gradient measures 4.2 mmHg. Aortic valve area, by VTI measures 2.74 cm. Pulmonic Valve: The pulmonic valve was not well visualized. Pulmonic valve regurgitation is not visualized. Aorta: The aortic root and ascending aorta are structurally normal, with no evidence of dilitation. Venous: The inferior vena cava is normal in size with greater than 50% respiratory variability, suggesting right atrial pressure of 3 mmHg. IAS/Shunts: No atrial level shunt detected by color flow Doppler.  LEFT VENTRICLE PLAX 2D LVIDd:         4.50 cm   Diastology LVIDs:         3.30 cm   LV e' medial:    4.35 cm/s LV PW:         0.90 cm   LV E/e' medial:  15.6 LV IVS:        1.00 cm   LV e' lateral:   4.35 cm/s LVOT diam:     1.90 cm   LV E/e' lateral: 15.6 LV SV:         59 LV SV Index:   35 LVOT Area:     2.84 cm  RIGHT  VENTRICLE              IVC RV S prime:     14.00 cm/s  IVC diam: 1.20 cm TAPSE (M-mode): 2.4 cm LEFT ATRIUM           Index LA diam:      3.10 cm 1.85 cm/m LA Vol (A4C): 53.1 ml 31.72 ml/m  AORTIC VALVE                    PULMONIC VALVE AV Area (Vmax):    2.76 cm     PV Vmax:       0.75 m/s AV Area (Vmean):   2.54 cm     PV Peak grad:  2.2 mmHg AV Area (VTI):     2.74 cm AV Vmax:           102.00 cm/s AV Vmean:          70.100 cm/s AV VTI:            0.216 m AV Peak Grad:      4.2 mmHg AV Mean Grad:      2.0 mmHg LVOT Vmax:         99.20 cm/s LVOT Vmean:        62.700 cm/s LVOT VTI:          0.209 m LVOT/AV VTI ratio: 0.97  AORTA Ao Root diam: 3.00 cm Ao Asc diam:  3.00 cm MITRAL VALVE MV Area (PHT): 3.03 cm    SHUNTS MV Decel Time: 250 msec    Systemic VTI:  0.21 m MV E velocity: 67.70 cm/s  Systemic Diam: 1.90 cm MV A velocity: 76.30 cm/s MV E/A ratio:  0.89 Kate Sable MD Electronically signed by Kate Sable MD Signature Date/Time: 05/13/2022/4:17:27 PM    Final     Microbiology: Results for orders placed or performed in visit on 03/05/22  Urine Culture     Status: None   Collection Time: 03/05/22  4:55 PM   Specimen: Urine  Result Value Ref Range Status   MICRO NUMBER: [id number]  Final   SPECIMEN QUALITY: Adequate  Final   Sample Source NOT GIVEN  Final   STATUS: FINAL  Final   ISOLATE 1:   Final    Mixed genital flora isolated. These superficial bacteria are not indicative of a urinary tract infection. No further organism identification is warranted on this specimen. If clinically indicated, recollect clean-catch, mid-stream urine and transfer  immediately to Urine Culture Transport Tube.     Labs: CBC: Recent Labs  Lab 05/28/22 0832 05/30/22 2014 05/31/22 0620  WBC 8.2 10.3 14.7*  NEUTROABS 5.7 7.1  --   HGB 11.2* 10.0* 11.7*  HCT 33.2* 31.3* 36.5  MCV 96.9 98.7 96.3  PLT 167.0 170 121   Basic Metabolic Panel: Recent Labs  Lab 05/28/22 0832 05/30/22 2014 05/31/22 0620  NA  139 139 139  K 3.9 3.5 3.7  CL 106 109 107  CO2 '26 26 26  '$ GLUCOSE 98 135* 113*  BUN '18 20 16  '$ CREATININE 0.83 0.81 0.72  CALCIUM 9.0 8.7* 8.6*  MG  --  1.8  --    Liver Function Tests: Recent Labs  Lab 05/28/22 0832 05/30/22 2014 05/31/22 0620  AST '16 20 19  '$ ALT '11 13 14  '$ ALKPHOS 57 50 50  BILITOT 0.7 0.7 0.9  PROT 6.0 6.2* 6.4*  ALBUMIN 3.8 3.5 3.6   CBG: No results for input(s): "GLUCAP" in the  last 168 hours.  Discharge time spent: greater than 30 minutes.  Signed: Jennye Boroughs, MD [facility] 05/31/2022

## 2022-05-31 NOTE — Plan of Care (Signed)
The patein is admitted from ED to 2A room 48. A & O x 4. Denied any acute pain.Full assessment to epic completed. NSR on the monitor.Will continue to monitor.

## 2022-05-31 NOTE — Assessment & Plan Note (Signed)
No anxiety meds. We will follow and may need antianxiety medication.

## 2022-05-31 NOTE — Progress Notes (Signed)
ANTICOAGULATION CONSULT NOTE -   Pharmacy Consult for Heparin  Indication: AFib,  NSTEMI ?   Allergies  Allergen Reactions   Ciprofloxacin Diarrhea   Clindamycin/Lincomycin Diarrhea   Sulfa Antibiotics Rash    Rash in throat   Augmentin [Amoxicillin-Pot Clavulanate] Diarrhea   Gabapentin Other (See Comments)    Irregular heartbeat   Naproxen Sodium Other (See Comments)    Causes gastritis   Prednisone     Atrial Fibrillation   Tramadol Diarrhea    "upset stomach, headache, dizziness, drowsiness"   Codeine Nausea Only and Rash    Upset stomach   Doxycycline Hyclate Nausea And Vomiting and Nausea Only    Dry heaves.    E-Mycin [Erythromycin] Rash   Macrobid [Nitrofurantoin] Rash    severe   Nabumetone Rash and Other (See Comments)    Upsets liver enzymes   Nitrofurantoin Monohyd Macro Rash   Nsaids Rash    Gastritis, GI ulceration.    Phenobarbital Other (See Comments), Rash and Anxiety    "got wild" Patient becomes very hyper and anxious   Statins Rash    Upsets liver enzymes    Patient Measurements: Height: '5\' 5"'$  (165.1 cm) Weight: 61.6 kg (135 lb 12.9 oz) IBW/kg (Calculated) : 57 Heparin Dosing Weight: 62.3 kg   Vital Signs: Temp: 97.6 F (36.4 C) (09/03 0815) Temp Source: Oral (09/03 0815) BP: 122/69 (09/03 0815) Pulse Rate: 67 (09/03 0815)  Labs: Recent Labs    05/30/22 2014 05/30/22 2327 05/31/22 0620 05/31/22 0902  HGB 10.0*  --  11.7*  --   HCT 31.3*  --  36.5  --   PLT 170  --  184  --   APTT  --  30  --   --   LABPROT  --  13.7  --   --   INR  --  1.1  --   --   HEPARINUNFRC  --   --   --  0.41  CREATININE 0.81  --  0.72  --      Estimated Creatinine Clearance: 43.7 mL/min (by C-G formula based on SCr of 0.72 mg/dL).   Medical History: Past Medical History:  Diagnosis Date   Arthritis    Basal cell carcinoma 06/04/2010   left upper back   Chronic kidney disease    Colon polyps    Gastritis    GERD (gastroesophageal reflux  disease)    Hiatal hernia    Hyperlipidemia    Hypertension    IBS (irritable bowel syndrome)    Low back pain radiating to left leg 03/28/2020   Sciatica, left side 04/15/2020   Squamous cell carcinoma of skin 06/26/2013   left mid pretibial   Squamous cell carcinoma of skin 08/21/2015   lower sternum   Squamous cell carcinoma of skin 10/14/2017   right mid pretibial   Squamous cell carcinoma of skin 03/17/2018   right prox lateral elbow   Squamous cell carcinoma of skin 09/07/2018   right post thigh   Squamous cell carcinoma of skin 05/11/2019   right mid med pretibial   Squamous cell carcinoma of skin 06/20/2019   right bicep    Medications:  Medications Prior to Admission  Medication Sig Dispense Refill Last Dose   acetaminophen (TYLENOL) 325 MG tablet Take 650 mg by mouth every 6 (six) hours as needed.   prn at prn   amiodarone (PACERONE) 200 MG tablet Take 1 tablet (200 mg total) by mouth daily. 90 tablet 3 05/30/2022  at 1030   Calcium Citrate-Vitamin D (CITRACAL + D PO) Take 2 capsules by mouth every other day. Alternates with Vitamin D3 and centrum silver   05/29/2022 at 1030   Cholecalciferol (VITAMIN D) 50 MCG (2000 UT) CAPS Take 1 capsule by mouth every other day.   05/30/2022 at 1030   clobetasol ointment (TEMOVATE) 5.91 % Apply 1 Application topically 2 (two) times daily. (Patient taking differently: Apply 1 Application topically as directed. Apply 3 times a week to labia Monday, Wednesday, and Friday evening.) 60 g 1 05/30/2022 at 2200   CRANBERRY PO Take 2 capsules by mouth daily.    05/29/2022 at 2200   estradiol (ESTRACE) 0.1 MG/GM vaginal cream 1 Applicatorful every other day.   05/29/2022 at 2200   ezetimibe (ZETIA) 10 MG tablet Take 1 tablet (10 mg total) by mouth daily. 90 tablet 3 05/29/2022 at 2200   hyoscyamine (ANASPAZ) 0.125 MG TBDP disintergrating tablet Place 1 tablet (0.125 mg total) under the tongue every 6 (six) hours as needed (as needed for esophageal spasm). 30  tablet 0 prn at prn   metoprolol succinate (TOPROL XL) 25 MG 24 hr tablet Take 1 tablet (25 mg total) by mouth daily. 30 tablet 11 05/30/2022 at 1030   omeprazole (PRILOSEC) 20 MG capsule TAKE 1 CAPSULE BY MOUTH EVERY DAY 90 capsule 1 05/30/2022 at 1030   ondansetron (ZOFRAN ODT) 4 MG disintegrating tablet Take 1 tablet (4 mg total) by mouth every 8 (eight) hours as needed for nausea or vomiting. 20 tablet 0 prn at prn   Probiotic Product (ALIGN PO) Take 1 tablet by mouth daily.    05/30/2022 at 1030    Assessment: Pharmacy consulted to dose heparin for NSTEMI/Afib (?) in this 88 year old female.  No prior anticoag noted.   - spoke with RN regarding warning about epidural,  she states pt had epidural back in 01/2022 but not since. CrCl = 43.2 ml/min  9/3  0902 HL=0.41   therapeutic,  cont @ 750 units/hr    Goal of Therapy:  Heparin level 0.3-0.7 units/ml Monitor platelets by anticoagulation protocol: Yes   Plan:  9/3  0902 HL=0.41   therapeutic,   Continue heparin drip @ 750 units/hr Check confirmatory HL in 8 hrs CBC daily  Tejuan Gholson A 05/31/2022,9:58 AM

## 2022-05-31 NOTE — Assessment & Plan Note (Signed)
    Latest Ref Rng & Units 05/30/2022    8:14 PM 05/28/2022    8:32 AM 04/08/2022   12:04 PM  CBC  WBC 4.0 - 10.5 K/uL 10.3  8.2  15.2   Hemoglobin 12.0 - 15.0 g/dL 10.0  11.2  12.3   Hematocrit 36.0 - 46.0 % 31.3  33.2  38.1   Platelets 150 - 400 K/uL 170  167.0  213    We will follow. Type and screen. IV PPI.

## 2022-05-31 NOTE — ED Notes (Signed)
Patient noted to be in NSR at this time.  EKG to be obtained

## 2022-05-31 NOTE — Assessment & Plan Note (Addendum)
EKG shows a.fib rvr.  We will also get cardiology consult for her a.fib .Will continue heparin . Am team to continue if pt in a.fib overnight and after d/w cardiology for pt's careplan. She is a fall risk and has fallen in July last.

## 2022-05-31 NOTE — Consult Note (Signed)
Cardiology Consultation   Patient ID: Onell Mcmath MRN: [id number]; DOB: 1931-10-29  Admit date: 05/30/2022 Date of Consult: 05/31/2022  PCP:  Crecencio Mc, MD   [facility] Cardiologist:  Nelva Bush, MD  Electrophysiologist:  Vickie Epley, MD       Patient Profile:   Brenda Cardenas is a 88 y.o. female with a hx of SVT, hypertension, hyperlipidemia, and GERD who is being seen 05/31/2022 for the evaluation of recurrent SVT at the request of Dr. Posey Pronto.  History of Present Illness:   Brenda Cardenas has been working with Dr. Quentin Ore for SVT.  She was last seen in clinic 05/20/2022.  She has previously tried atenolol and this was subsequently switched to metoprolol.  She had an echo 05/13/2022 that revealed LVEF 55 to 60% with grade 2 diastolic dysfunction.  She reported more frequent episodes of SVT and was started on amiodarone 05/20/2022.  Metoprolol was decreased and she was started on amiodarone 200 mg twice daily for 7 days followed by 200 mg daily.  She presented to the ED 9/2 with recurrent SVT.  There was concern for atrial fibrillation and she was started on a heparin drip.  She was given a bolus of IV amiodarone and subsequently converted to sinus rhythm.  Labs were notable for normal TSH.  BNP was mildly elevated at 137.  Renal function and electrolytes are stable.  She is mildly anemic with a hemoglobin of 10, down from 11.23 days ago and baseline around 12.  Brenda Cardenas reports experiencing loose bowels and a yellowish tinge while on amiodarone, which may be related to gastrointestinal side effects. The patient states that she has no immediate family and felt upset being alone at the hospital during her recent episode. She mentions having a goddaughter and her family for support. Brenda Cardenas also reports a history of bruising easily and having a swollen leg after a fall in July. She was put on heparin last night due to concerns about a clot, but it was later  determined to be unnecessary.   Past Medical History:  Diagnosis Date   Arthritis    Basal cell carcinoma 06/04/2010   left upper back   Chronic kidney disease    Colon polyps    Gastritis    GERD (gastroesophageal reflux disease)    Hiatal hernia    Hyperlipidemia    Hypertension    IBS (irritable bowel syndrome)    Low back pain radiating to left leg 03/28/2020   Sciatica, left side 04/15/2020   Squamous cell carcinoma of skin 06/26/2013   left mid pretibial   Squamous cell carcinoma of skin 08/21/2015   lower sternum   Squamous cell carcinoma of skin 10/14/2017   right mid pretibial   Squamous cell carcinoma of skin 03/17/2018   right prox lateral elbow   Squamous cell carcinoma of skin 09/07/2018   right post thigh   Squamous cell carcinoma of skin 05/11/2019   right mid med pretibial   Squamous cell carcinoma of skin 06/20/2019   right bicep    Past Surgical History:  Procedure Laterality Date   APPENDECTOMY  1959   CARPAL TUNNEL RELEASE     CATARACT EXTRACTION W/ INTRAOCULAR LENS IMPLANT Left    CATARACT EXTRACTION W/PHACO Right 04/20/2018   Procedure: CATARACT EXTRACTION PHACO AND INTRAOCULAR LENS PLACEMENT ([place])  RIGHT;  Surgeon: Leandrew Koyanagi, MD;  Location: [facility];  Service: Ophthalmology;  Laterality: Right;   CHOLECYSTECTOMY  1995  EYE SURGERY     GANGLION CYST EXCISION     JOINT REPLACEMENT Right 03/2008   [facility]    KNEE ARTHROSCOPY     LITHOTRIPSY     SALIVARY GLAND SURGERY     SKIN CANCER EXCISION     TONSILLECTOMY     TONSILLECTOMY AND ADENOIDECTOMY     VEIN LIGATION       Home Medications:  Prior to Admission medications   Medication Sig Start Date End Date Taking? Authorizing Provider  acetaminophen (TYLENOL) 325 MG tablet Take 650 mg by mouth every 6 (six) hours as needed.   Yes [provider]  amiodarone (PACERONE) 200 MG tablet Take 1 tablet (200 mg total) by mouth daily. 05/28/22  Yes Vickie Epley, MD   Calcium Citrate-Vitamin D (CITRACAL + D PO) Take 2 capsules by mouth every other day. Alternates with Vitamin D3 and centrum silver   Yes [provider]  Cholecalciferol (VITAMIN D) 50 MCG (2000 UT) CAPS Take 1 capsule by mouth every other day.   Yes [provider]  clobetasol ointment (TEMOVATE) 1.61 % Apply 1 Application topically 2 (two) times daily. Patient taking differently: Apply 1 Application topically as directed. Apply 3 times a week to labia Monday, Wednesday, and Friday evening. 05/13/22  Yes Crecencio Mc, MD  CRANBERRY PO Take 2 capsules by mouth daily.    Yes [provider]  estradiol (ESTRACE) 0.1 MG/GM vaginal cream 1 Applicatorful every other day.   Yes [provider]  ezetimibe (ZETIA) 10 MG tablet Take 1 tablet (10 mg total) by mouth daily. 01/13/22  Yes Crecencio Mc, MD  hyoscyamine (ANASPAZ) 0.125 MG TBDP disintergrating tablet Place 1 tablet (0.125 mg total) under the tongue every 6 (six) hours as needed (as needed for esophageal spasm). 01/11/20  Yes Crecencio Mc, MD  metoprolol succinate (TOPROL XL) 25 MG 24 hr tablet Take 1 tablet (25 mg total) by mouth daily. 05/20/22  Yes Vickie Epley, MD  omeprazole (PRILOSEC) 20 MG capsule TAKE 1 CAPSULE BY MOUTH EVERY DAY 02/19/22  Yes Crecencio Mc, MD  ondansetron (ZOFRAN ODT) 4 MG disintegrating tablet Take 1 tablet (4 mg total) by mouth every 8 (eight) hours as needed for nausea or vomiting. 07/04/21  Yes Crecencio Mc, MD  Probiotic Product (ALIGN PO) Take 1 tablet by mouth daily.    Yes [provider]    Inpatient Medications: Scheduled Meds:  amiodarone  200 mg Oral Daily   ezetimibe  10 mg Oral Daily   metoprolol succinate  25 mg Oral Daily   Continuous Infusions:  heparin 750 Units/hr (05/31/22 0127)   PRN Meds: acetaminophen, ondansetron  Allergies:    Allergies  Allergen Reactions   Ciprofloxacin Diarrhea   Clindamycin/Lincomycin Diarrhea   Sulfa  Antibiotics Rash    Rash in throat   Augmentin [Amoxicillin-Pot Clavulanate] Diarrhea   Gabapentin Other (See Comments)    Irregular heartbeat   Naproxen Sodium Other (See Comments)    Causes gastritis   Prednisone     Atrial Fibrillation   Tramadol Diarrhea    "upset stomach, headache, dizziness, drowsiness"   Codeine Nausea Only and Rash    Upset stomach   Doxycycline Hyclate Nausea And Vomiting and Nausea Only    Dry heaves.    E-Mycin [Erythromycin] Rash   Macrobid [Nitrofurantoin] Rash    severe   Nabumetone Rash and Other (See Comments)    Upsets liver enzymes   Nitrofurantoin Monohyd  Macro Rash   Nsaids Rash    Gastritis, GI ulceration.    Phenobarbital Other (See Comments), Rash and Anxiety    "got wild" Patient becomes very hyper and anxious   Statins Rash    Upsets liver enzymes    Social History:   Social History   Socioeconomic History   Marital status: Single    Spouse name: Not on file   Number of children: Not on file   Years of education: Not on file   Highest education level: Not on file  Occupational History   Not on file  Tobacco Use   Smoking status: Never   Smokeless tobacco: Never  Vaping Use   Vaping Use: Never used  Substance and Sexual Activity   Alcohol use: No    Alcohol/week: 0.0 standard drinks of alcohol   Drug use: No   Sexual activity: Never  Other Topics Concern   Not on file  Social History Narrative   Not on file   Social Determinants of Health   Financial Resource Strain: Low Risk  (01/15/2021)   Overall Financial Resource Strain (CARDIA)    Difficulty of Paying Living Expenses: Not hard at all  Food Insecurity: No Food Insecurity (01/15/2021)   Hunger Vital Sign    Worried About Running Out of Food in the Last Year: Never true    Ran Out of Food in the Last Year: Never true  Transportation Needs: No Transportation Needs (01/15/2021)   PRAPARE - Hydrologist (Medical): No    Lack of  Transportation (Non-Medical): No  Physical Activity: Insufficiently Active (01/15/2021)   Exercise Vital Sign    Days of Exercise per Week: 3 days    Minutes of Exercise per Session: 30 min  Stress: No Stress Concern Present (01/15/2021)   [place]    Feeling of Stress : Not at all  Social Connections: Unknown (01/15/2021)   Social Connection and Isolation Panel [NHANES]    Frequency of Communication with Friends and Family: More than three times a week    Frequency of Social Gatherings with Friends and Family: Not on file    Attends Religious Services: Not on file    Active Member of Clubs or Organizations: Not on file    Attends Archivist Meetings: Not on file    Marital Status: Not on file  Intimate Partner Violence: Not At Risk (01/15/2021)   Humiliation, Afraid, Rape, and Kick questionnaire    Fear of Current or Ex-Partner: No    Emotionally Abused: No    Physically Abused: No    Sexually Abused: No    Family History:    Family History  Problem Relation Age of Onset   Heart disease Mother    Arthritis Mother    Diabetes Father      ROS:  Please see the history of present illness.  All other ROS reviewed and negative.     Physical Exam/Data:   Vitals:   05/31/22 0618 05/31/22 0643 05/31/22 0656 05/31/22 0815  BP:    122/69  Pulse:    67  Resp:  15  18  Temp:    97.6 F (36.4 C)  TempSrc:    Oral  SpO2:    100%  Weight: 61.6 kg     Height:   '5\' 5"'$  (1.651 m)     Intake/Output Summary (Last 24 hours) at 05/31/2022 1154 Last data filed at 05/31/2022  0700 Gross per 24 hour  Intake 677.69 ml  Output --  Net 677.69 ml      05/31/2022    6:18 AM 05/20/2022    9:31 AM 04/13/2022    1:56 PM  Last 3 Weights  Weight (lbs) 135 lb 12.9 oz 137 lb 6.4 oz 135 lb  Weight (kg) 61.6 kg 62.324 kg 61.236 kg     VS:  BP 122/69 (BP Location: Right Arm)   Pulse 67   Temp 97.6 F (36.4 C) (Oral)   Resp  18   Ht '5\' 5"'$  (1.651 m)   Wt 61.6 kg   SpO2 100%   BMI 22.60 kg/m  , BMI Body mass index is 22.6 kg/m. GENERAL:  Well appearing HEENT: Pupils equal round and reactive, fundi not visualized, oral mucosa unremarkable NECK:  No jugular venous distention, waveform within normal limits, carotid upstroke brisk and symmetric, no bruits, no thyromegaly LUNGS:  Clear to auscultation bilaterally HEART:  RRR.  PMI not displaced or sustained,S1 and S2 within normal limits, no S3, no S4, no clicks, no rubs, no murmurs ABD:  Flat, positive bowel sounds normal in frequency in pitch, no bruits, no rebound, no guarding, no midline pulsatile mass, no hepatomegaly, no splenomegaly EXT:  2 plus pulses throughout, no edema, no cyanosis no clubbing SKIN:  No rashes no nodules NEURO:  Cranial nerves II through XII grossly intact, motor grossly intact throughout PSYCH:  Cognitively intact, oriented to person place and time   EKG:  The EKG was personally reviewed and demonstrates:  SVT.  Rate 135 bpm. LAFB  Relevant CV Studies: Echo 05/13/22: IMPRESSIONS     1. Left ventricular ejection fraction, by estimation, is 55 to 60%. The  left ventricle has normal function. The left ventricle has no regional  wall motion abnormalities. Left ventricular diastolic parameters are  consistent with Grade II diastolic  dysfunction (pseudonormalization).   2. Right ventricular systolic function is normal. The right ventricular  size is not well visualized.   3. The mitral valve is normal in structure. Mild mitral valve  regurgitation.   4. The aortic valve was not well visualized. Aortic valve regurgitation  is not visualized. Aortic valve sclerosis is present, with no evidence of  aortic valve stenosis.   5. The inferior vena cava is normal in size with greater than 50%  respiratory variability, suggesting right atrial pressure of 3 mmHg.   Laboratory Data:  High Sensitivity Troponin:  No results for input(s):  "TROPONINIHS" in the last 720 hours.   Chemistry Recent Labs  Lab 05/28/22 0832 05/30/22 2014 05/31/22 0620  NA 139 139 139  K 3.9 3.5 3.7  CL 106 109 107  CO2 '26 26 26  '$ GLUCOSE 98 135* 113*  BUN '18 20 16  '$ CREATININE 0.83 0.81 0.72  CALCIUM 9.0 8.7* 8.6*  MG  --  1.8  --   GFRNONAA  --  >60 >60  ANIONGAP  --  4* 6    Recent Labs  Lab 05/28/22 0832 05/30/22 2014 05/31/22 0620  PROT 6.0 6.2* 6.4*  ALBUMIN 3.8 3.5 3.6  AST '16 20 19  '$ ALT '11 13 14  '$ ALKPHOS 57 50 50  BILITOT 0.7 0.7 0.9   Lipids  Recent Labs  Lab 05/28/22 0832  CHOL 150  TRIG 86.0  HDL 54.00  LDLCALC 79  CHOLHDL 3    Hematology Recent Labs  Lab 05/28/22 0832 05/30/22 2014 05/31/22 0620  WBC 8.2 10.3 14.7*  RBC 3.43  Repeated and verified X2.* 3.17* 3.79*  HGB 11.2* 10.0* 11.7*  HCT 33.2* 31.3* 36.5  MCV 96.9 98.7 96.3  MCH  --  31.5 30.9  MCHC 33.8 31.9 32.1  RDW 23.3* 19.9* 19.8*  PLT 167.0 170 184   Thyroid  Recent Labs  Lab 05/30/22 2327  TSH 2.277    BNP Recent Labs  Lab 05/30/22 2014  BNP 137.6*    DDimer No results for input(s): "DDIMER" in the last 168 hours.   Radiology/Studies:  DG Chest Portable 1 View  Result Date: 05/31/2022 CLINICAL DATA:  Shortness of breath.  Tachycardia. EXAM: PORTABLE CHEST 1 VIEW COMPARISON:  Chest radiograph dated 04/08/2022. FINDINGS: No focal consolidation, pleural effusion or pneumothorax. Mild diffuse chronic interstitial coarsening and bronchitic changes. The cardiac silhouette is within limits. Small hiatal hernia. Atherosclerotic calcification of the aorta. No acute osseous pathology. IMPRESSION: 1. No active cardiopulmonary disease. 2. Small hiatal hernia. Electronically Signed   By: Anner Crete M.D.   On: 05/31/2022 01:24     Assessment and Plan:   # Supraventricular Tachycardia (SVT) - Plan: Continue amiodarone twice a day for another week to reach a total loading dose of 5 grams. Maintain metoprolol once daily as prescribed by  Dr. Quentin Ore. Monitor heart rate and rhythm closely. Schedule a follow-up appointment with Dr. Mardene Speak office within 1-2 weeks to reassess management. Informed the patient that GI side effects may worsen during the twice-a-day dosing of amiodarone. Encourage the patient to report any significant changes in bowel habits or worsening symptoms.  Have extensively reviewed the telemetry and EKGs.   there is no evidence of atrial fibrillation or flutter.  Therefore we will discontinue her heparin.  Start Lovenox prophylaxis if she is going to stay.  From my standpoint it is okay for her to be discharged with close follow-up.     Risk Assessment/Risk Scores:        [facility] will sign off.   Medication Recommendations: Resume amiodarone twice daily for 1 week then 20 mg daily.  Discontinue heparin. Other recommendations (labs, testing, etc): None Follow up as an outpatient: We will arrange.  She has follow-up with Dr. Saunders Revel on 10/18.        For questions or updates, please contact [place] Please consult [email or web address] for contact info under    Signed, Skeet Latch, MD  05/31/2022 11:54 AM

## 2022-06-02 ENCOUNTER — Ambulatory Visit (INDEPENDENT_AMBULATORY_CARE_PROVIDER_SITE_OTHER): Payer: Medicare Other | Admitting: Internal Medicine

## 2022-06-02 ENCOUNTER — Encounter: Payer: Self-pay | Admitting: Internal Medicine

## 2022-06-02 VITALS — BP 120/58 | HR 67 | Temp 97.6°F | Ht 65.0 in | Wt 134.2 lb

## 2022-06-02 DIAGNOSIS — D649 Anemia, unspecified: Secondary | ICD-10-CM

## 2022-06-02 DIAGNOSIS — I471 Supraventricular tachycardia: Secondary | ICD-10-CM | POA: Diagnosis not present

## 2022-06-02 DIAGNOSIS — L439 Lichen planus, unspecified: Secondary | ICD-10-CM | POA: Diagnosis not present

## 2022-06-02 DIAGNOSIS — I248 Other forms of acute ischemic heart disease: Secondary | ICD-10-CM

## 2022-06-02 LAB — CBC WITH DIFFERENTIAL/PLATELET
Basophils Absolute: 0.1 10*3/uL (ref 0.0–0.1)
Basophils Relative: 1.2 % (ref 0.0–3.0)
Eosinophils Absolute: 0.2 10*3/uL (ref 0.0–0.7)
Eosinophils Relative: 2.2 % (ref 0.0–5.0)
HCT: 35.5 % — ABNORMAL LOW (ref 36.0–46.0)
Hemoglobin: 11.9 g/dL — ABNORMAL LOW (ref 12.0–15.0)
Lymphocytes Relative: 22.5 % (ref 12.0–46.0)
Lymphs Abs: 2.1 10*3/uL (ref 0.7–4.0)
MCHC: 33.4 g/dL (ref 30.0–36.0)
MCV: 97.2 fl (ref 78.0–100.0)
Monocytes Absolute: 0.4 10*3/uL (ref 0.1–1.0)
Monocytes Relative: 4.6 % (ref 3.0–12.0)
Neutro Abs: 6.4 10*3/uL (ref 1.4–7.7)
Neutrophils Relative %: 69.5 % (ref 43.0–77.0)
Platelets: 183 10*3/uL (ref 150.0–400.0)
RBC: 3.65 Mil/uL — ABNORMAL LOW (ref 3.87–5.11)
RDW: 23.3 % — ABNORMAL HIGH (ref 11.5–15.5)
WBC: 9.3 10*3/uL (ref 4.0–10.5)

## 2022-06-02 LAB — B12 AND FOLATE PANEL
Folate: 14.7 ng/mL (ref >5.9)
Vitamin B-12: 1029 pg/mL — ABNORMAL HIGH (ref 211–911)

## 2022-06-02 LAB — IBC + FERRITIN
Ferritin: 201.3 ng/mL (ref 10.0–291.0)
Iron: 108 ug/dL (ref 42–145)
Saturation Ratios: 33.3 % (ref 20.0–50.0)
TIBC: 324.8 ug/dL (ref 250.0–450.0)
Transferrin: 232 mg/dL (ref 212.0–360.0)

## 2022-06-02 NOTE — Assessment & Plan Note (Addendum)
Mild,  Improved by labs done during hospitalization. Normal iron and b12 but positive FOBT. No report of hemorrhoidal bleeding . Marland Kitchen  Referring to GI

## 2022-06-02 NOTE — Assessment & Plan Note (Addendum)
Improved symptomatically on current regimen .  Now managed by [facility]  With temovate on the labia and estrogen on the urethra

## 2022-06-02 NOTE — Patient Instructions (Addendum)
You can use miralax or Benefber every night to keep stools from becoming hard  If your stools become loose,  stop the miralax  The stools test we are sending you home is looking for hidden blood in your stool because you are very slightly anemic

## 2022-06-02 NOTE — Progress Notes (Addendum)
Subjective:  Patient ID: Brenda Cardenas, female    DOB: May 25, 1932  Age: 88 y.o. MRN: [id number]  CC: The primary encounter diagnosis was Anemia, unspecified type. Diagnoses of Atrophic lichen planus and SVT (supraventricular tachycardia) (HCC) were also pertinent to this visit.   HPI Brenda Cardenas presents for  Chief Complaint  Patient presents with   Follow-up   1) SVT:    chronic with recent start of amiodarone  on august 24  by her cardiologist.  He reduced her dose from twice daily to once daily on August 30 due to diarrhea  and felt better with reduced dose but  developed tachycardia on Sept 1 and took an extra dose of metoprolol on Saturday which did not improve her heart rate. She was admitted on Sept 2 to  [facility]  for  SVT .  She was not having chest pain Was initially diagnosed with  A Fib with RVR and started on heparin , but diagnosis was changed to SVT after cardiologist Skeet Latch reviewed her EKG.  Patient was quite alarmed at the potential ramifications of having atrial fib and spent considerable time today tryng to understand why this was originally diagnosed.  she will follow up with Dr Quentin Ore, her cardiologist tomorrow.   She is taking amiodarone 200 mg bid since discharge,  plan is for 7 days  and atenolol every 12 hours   2) Lichen planus ;  using clobetasol  on the vagina,  estrogen cream on the urethra M W F   3) Anemia:  very mild. ,  no reports of blood in stool.  No unintentional weight loss ,  no history of gastritis.  Does have diverticulosis and hemorrhoids  Outpatient Medications Prior to Visit  Medication Sig Dispense Refill   acetaminophen (TYLENOL) 325 MG tablet Take 650 mg by mouth every 6 (six) hours as needed.     amiodarone (PACERONE) 200 MG tablet Take 1 tablet (200 mg total) by mouth 2 (two) times daily for 7 days. 14 tablet 0   amiodarone (PACERONE) 200 MG tablet Take 1 tablet (200 mg total) by mouth daily. 90 tablet 3   Calcium Citrate-Vitamin D  (CITRACAL + D PO) Take 2 capsules by mouth every other day. Alternates with Vitamin D3 and centrum silver     Cholecalciferol (VITAMIN D) 50 MCG (2000 UT) CAPS Take 1 capsule by mouth every other day.     clobetasol ointment (TEMOVATE) 1.51 % Apply 1 Application topically 2 (two) times daily. 60 g 1   CRANBERRY PO Take 2 capsules by mouth daily.      estradiol (ESTRACE) 0.1 MG/GM vaginal cream 1 Applicatorful every other day.     ezetimibe (ZETIA) 10 MG tablet Take 1 tablet (10 mg total) by mouth daily. 90 tablet 3   hyoscyamine (ANASPAZ) 0.125 MG TBDP disintergrating tablet Place 1 tablet (0.125 mg total) under the tongue every 6 (six) hours as needed (as needed for esophageal spasm). 30 tablet 0   metoprolol succinate (TOPROL XL) 25 MG 24 hr tablet Take 1 tablet (25 mg total) by mouth daily. 30 tablet 11   omeprazole (PRILOSEC) 20 MG capsule TAKE 1 CAPSULE BY MOUTH EVERY DAY 90 capsule 1   ondansetron (ZOFRAN ODT) 4 MG disintegrating tablet Take 1 tablet (4 mg total) by mouth every 8 (eight) hours as needed for nausea or vomiting. 20 tablet 0   Probiotic Product (ALIGN PO) Take 1 tablet by mouth daily.      No facility-administered  medications prior to visit.    Review of Systems;  Patient denies headache, fevers, malaise, unintentional weight loss, skin rash, eye pain, sinus congestion and sinus pain, sore throat, dysphagia,  hemoptysis , cough, dyspnea, wheezing, chest pain, palpitations, orthopnea, edema, abdominal pain, nausea, melena, diarrhea, constipation, flank pain, dysuria, hematuria, urinary  Frequency, nocturia, numbness, tingling, seizures,  Focal weakness, Loss of consciousness,  Tremor, insomnia, depression, anxiety, and suicidal ideation.      Objective:  BP (!) 120/58 (BP Location: Left Arm, Patient Position: Sitting, Cuff Size: Normal)   Pulse 67   Temp 97.6 F (36.4 C) (Oral)   Ht '5\' 5"'$  (1.651 m)   Wt 134 lb 3.2 oz (60.9 kg)   SpO2 96%   BMI 22.33 kg/m   BP  Readings from Last 3 Encounters:  06/03/22 (!) 149/73  06/02/22 (!) 120/58  05/31/22 116/62    Wt Readings from Last 3 Encounters:  06/03/22 134 lb 3.2 oz (60.9 kg)  06/02/22 134 lb 3.2 oz (60.9 kg)  05/31/22 135 lb 12.9 oz (61.6 kg)    General appearance: alert, cooperative and appears stated age Ears: normal TM's and external ear canals both ears Throat: lips, mucosa, and tongue normal; teeth and gums normal Neck: no adenopathy, no carotid bruit, supple, symmetrical, trachea midline and thyroid not enlarged, symmetric, no tenderness/mass/nodules Back: symmetric, no curvature. ROM normal. No CVA tenderness. Lungs: clear to auscultation bilaterally Heart: regular rate and rhythm, S1, S2 normal, no murmur, click, rub or gallop Abdomen: soft, non-tender; bowel sounds normal; no masses,  no organomegaly Pulses: 2+ and symmetric Skin: Skin color, texture, turgor normal. No rashes or lesions Lymph nodes: Cervical, supraclavicular, and axillary nodes normal.  Lab Results  Component Value Date   HGBA1C 5.4 01/05/2020   HGBA1C 5.5 06/16/2018   HGBA1C 5.7 03/24/2018    Lab Results  Component Value Date   CREATININE 0.72 05/31/2022   CREATININE 0.81 05/30/2022   CREATININE 0.83 05/28/2022    Lab Results  Component Value Date   WBC 9.3 06/02/2022   HGB 11.9 (L) 06/02/2022   HCT 35.5 (L) 06/02/2022   PLT 183.0 06/02/2022   GLUCOSE 113 (H) 05/31/2022   CHOL 150 05/28/2022   TRIG 86.0 05/28/2022   HDL 54.00 05/28/2022   LDLDIRECT 103.0 06/16/2017   LDLCALC 79 05/28/2022   ALT 14 05/31/2022   AST 19 05/31/2022   NA 139 05/31/2022   K 3.7 05/31/2022   CL 107 05/31/2022   CREATININE 0.72 05/31/2022   BUN 16 05/31/2022   CO2 26 05/31/2022   TSH 2.277 05/30/2022   INR 1.1 05/30/2022   HGBA1C 5.4 01/05/2020   MICROALBUR 1.0 01/05/2020    DG Chest Portable 1 View  Result Date: 05/31/2022 CLINICAL DATA:  Shortness of breath.  Tachycardia. EXAM: PORTABLE CHEST 1 VIEW  COMPARISON:  Chest radiograph dated 04/08/2022. FINDINGS: No focal consolidation, pleural effusion or pneumothorax. Mild diffuse chronic interstitial coarsening and bronchitic changes. The cardiac silhouette is within limits. Small hiatal hernia. Atherosclerotic calcification of the aorta. No acute osseous pathology. IMPRESSION: 1. No active cardiopulmonary disease. 2. Small hiatal hernia. Electronically Signed   By: Anner Crete M.D.   On: 05/31/2022 01:24    Assessment & Plan:   Problem List Items Addressed This Visit     Anemia - Primary    Mild,  Improved by labs done during hospitalization. Normal iron and b12 but positive FOBT. No report of hemorrhoidal bleeding . Marland Kitchen  Referring to GI  Relevant Orders   Fecal occult blood, imunochemical (Completed)   B12 and Folate Panel (Completed)   IBC + Ferritin (Completed)   CBC with Differential/Platelet (Completed)   Atrophic lichen planus    Improved symptomatically on current regimen .  Now managed by [facility]  With temovate on the labia and estrogen on the urethra       SVT (supraventricular tachycardia) (HCC)    Currently controlled after brief admission  On amiodarone 200 mg bid  And metoprolol 25 mg daily       I spent a total of  30 minutes with this patient in a face to face visit on the date of this encounter reviewing her brief hospitalization, her  most recent with her cardiologist an gynecologist.  most recent imaging study ,   and post visit ordering of testing and therapeutics.    Follow-up: No follow-ups on file.   Crecencio Mc, MD

## 2022-06-02 NOTE — Assessment & Plan Note (Addendum)
Currently controlled after brief admission  On amiodarone 200 mg bid  And metoprolol 25 mg daily

## 2022-06-03 ENCOUNTER — Encounter: Payer: Self-pay | Admitting: Cardiology

## 2022-06-03 ENCOUNTER — Ambulatory Visit: Payer: Medicare Other | Attending: Cardiology | Admitting: Cardiology

## 2022-06-03 VITALS — BP 149/73 | HR 68 | Ht 65.0 in | Wt 134.2 lb

## 2022-06-03 DIAGNOSIS — I471 Supraventricular tachycardia: Secondary | ICD-10-CM | POA: Diagnosis not present

## 2022-06-03 DIAGNOSIS — I10 Essential (primary) hypertension: Secondary | ICD-10-CM | POA: Diagnosis not present

## 2022-06-03 NOTE — Patient Instructions (Signed)
Medication Instructions:  none *If you need a refill on your cardiac medications before your next appointment, please call your pharmacy*   Lab Work: CMP, TSH, Free T4 6 weeks Nature conservation officer at [facility] 1st desk on the right to check in Lab hours: 7:30 am- 5:30 pm (walk in basis)  If you have labs (blood work) drawn today and your tests are completely normal, you will receive your results only by: MyChart Message (if you have MyChart) OR A paper copy in the mail If you have any lab test that is abnormal or we need to change your treatment, we will call you to review the results.   Testing/Procedures: none   Follow-Up: At [facility], you and your health needs are our priority.  As part of our continuing mission to provide you with exceptional heart care, we have created designated Provider Care Teams.  These Care Teams include your primary Cardiologist (physician) and Advanced Practice Providers (APPs -  Physician Assistants and Nurse Practitioners) who all work together to provide you with the care you need, when you need it.  We recommend signing up for the patient portal called "MyChart".  Sign up information is provided on this After Visit Summary.  MyChart is used to connect with patients for Virtual Visits (Telemedicine).  Patients are able to view lab/test results, encounter notes, upcoming appointments, etc.  Non-urgent messages can be sent to your provider as well.   To learn more about what you can do with MyChart, go to [email or web address].    Your next appointment:   As scheduled with Dr. Quentin Ore  Important Information About Sugar

## 2022-06-03 NOTE — Progress Notes (Signed)
Electrophysiology Office Follow up Visit Note:    Date:  06/03/2022   ID:  Brenda Cardenas, DOB [birth year], MRN [id number]  PCP:  Crecencio Mc, MD  [facility] Cardiologist:  Nelva Bush, MD  [facility] Electrophysiologist:  Vickie Epley, MD    Interval History:    Brenda Cardenas is a 88 y.o. female who presents for a follow up visit. They were last seen in clinic May 20, 2022 for SVT.  At the last appointment I decreased her metoprolol and added amiodarone to help control recurrent episodes of SVT.  She was recently admitted September 2 for recurrent SVT.  This was treated with a bolus of IV amiodarone which subsequently converted her to sinus rhythm.  She presents today for follow-up.  She has done well since leaving the hospital.  Her rhythm has been stable.  She continues to tolerate amiodarone.      Past Medical History:  Diagnosis Date   Arthritis    Basal cell carcinoma 06/04/2010   left upper back   Chronic kidney disease    Colon polyps    Gastritis    GERD (gastroesophageal reflux disease)    Hiatal hernia    Hyperlipidemia    Hypertension    IBS (irritable bowel syndrome)    Low back pain radiating to left leg 03/28/2020   Sciatica, left side 04/15/2020   Squamous cell carcinoma of skin 06/26/2013   left mid pretibial   Squamous cell carcinoma of skin 08/21/2015   lower sternum   Squamous cell carcinoma of skin 10/14/2017   right mid pretibial   Squamous cell carcinoma of skin 03/17/2018   right prox lateral elbow   Squamous cell carcinoma of skin 09/07/2018   right post thigh   Squamous cell carcinoma of skin 05/11/2019   right mid med pretibial   Squamous cell carcinoma of skin 06/20/2019   right bicep    Past Surgical History:  Procedure Laterality Date   APPENDECTOMY  1959   CARPAL TUNNEL RELEASE     CATARACT EXTRACTION W/ INTRAOCULAR LENS IMPLANT Left    CATARACT EXTRACTION W/PHACO Right 04/20/2018   Procedure: CATARACT  EXTRACTION PHACO AND INTRAOCULAR LENS PLACEMENT ([place])  RIGHT;  Surgeon: Leandrew Koyanagi, MD;  Location: [place];  Service: Ophthalmology;  Laterality: Right;   CHOLECYSTECTOMY  1995   EYE SURGERY     GANGLION CYST EXCISION     JOINT REPLACEMENT Right 03/2008   [place]    KNEE ARTHROSCOPY     LITHOTRIPSY     SALIVARY GLAND SURGERY     SKIN CANCER EXCISION     TONSILLECTOMY     TONSILLECTOMY AND ADENOIDECTOMY     VEIN LIGATION      Current Medications: Current Meds  Medication Sig   acetaminophen (TYLENOL) 325 MG tablet Take 650 mg by mouth every 6 (six) hours as needed.   amiodarone (PACERONE) 200 MG tablet Take 1 tablet (200 mg total) by mouth 2 (two) times daily for 7 days.   [START ON 06/07/2022] amiodarone (PACERONE) 200 MG tablet Take 1 tablet (200 mg total) by mouth daily.   Calcium Citrate-Vitamin D (CITRACAL + D PO) Take 2 capsules by mouth every other day. Alternates with Vitamin D3 and centrum silver   Cholecalciferol (VITAMIN D) 50 MCG (2000 UT) CAPS Take 1 capsule by mouth every other day.   clobetasol ointment (TEMOVATE) 0.53 % Apply 1 Application topically 2 (two) times daily.   CRANBERRY PO Take 2 capsules  by mouth daily.    estradiol (ESTRACE) 0.1 MG/GM vaginal cream 1 Applicatorful every other day.   ezetimibe (ZETIA) 10 MG tablet Take 1 tablet (10 mg total) by mouth daily.   hyoscyamine (ANASPAZ) 0.125 MG TBDP disintergrating tablet Place 1 tablet (0.125 mg total) under the tongue every 6 (six) hours as needed (as needed for esophageal spasm).   metoprolol succinate (TOPROL XL) 25 MG 24 hr tablet Take 1 tablet (25 mg total) by mouth daily.   omeprazole (PRILOSEC) 20 MG capsule TAKE 1 CAPSULE BY MOUTH EVERY DAY   ondansetron (ZOFRAN ODT) 4 MG disintegrating tablet Take 1 tablet (4 mg total) by mouth every 8 (eight) hours as needed for nausea or vomiting.   Probiotic Product (ALIGN PO) Take 1 tablet by mouth daily.      Allergies:   Ciprofloxacin,  Clindamycin/lincomycin, Sulfa antibiotics, Augmentin [amoxicillin-pot clavulanate], Gabapentin, Naproxen sodium, Prednisone, Tramadol, Codeine, Doxycycline hyclate, E-mycin [erythromycin], Macrobid [nitrofurantoin], Nabumetone, Nitrofurantoin monohyd macro, Nsaids, Phenobarbital, and Statins   Social History   Socioeconomic History   Marital status: Single    Spouse name: Not on file   Number of children: Not on file   Years of education: Not on file   Highest education level: Not on file  Occupational History   Not on file  Tobacco Use   Smoking status: Never   Smokeless tobacco: Never  Vaping Use   Vaping Use: Never used  Substance and Sexual Activity   Alcohol use: No    Alcohol/week: 0.0 standard drinks of alcohol   Drug use: No   Sexual activity: Never  Other Topics Concern   Not on file  Social History Narrative   Not on file   Social Determinants of Health   Financial Resource Strain: Low Risk  (01/15/2021)   Overall Financial Resource Strain (CARDIA)    Difficulty of Paying Living Expenses: Not hard at all  Food Insecurity: No Food Insecurity (01/15/2021)   Hunger Vital Sign    Worried About Running Out of Food in the Last Year: Never true    Ran Out of Food in the Last Year: Never true  Transportation Needs: No Transportation Needs (01/15/2021)   PRAPARE - Hydrologist (Medical): No    Lack of Transportation (Non-Medical): No  Physical Activity: Insufficiently Active (01/15/2021)   Exercise Vital Sign    Days of Exercise per Week: 3 days    Minutes of Exercise per Session: 30 min  Stress: No Stress Concern Present (01/15/2021)   [place]    Feeling of Stress : Not at all  Social Connections: Unknown (01/15/2021)   Social Connection and Isolation Panel [NHANES]    Frequency of Communication with Friends and Family: More than three times a week    Frequency of Social  Gatherings with Friends and Family: Not on file    Attends Religious Services: Not on file    Active Member of Clubs or Organizations: Not on file    Attends Archivist Meetings: Not on file    Marital Status: Not on file     Family History: The patient's family history includes Arthritis in her mother; Diabetes in her father; Heart disease in her mother.  ROS:   Please see the history of present illness.    All other systems reviewed and are negative.  EKGs/Labs/Other Studies Reviewed:    The following studies were reviewed today:  May 13, 2022  echo Left ventricular function normal, 55% RV function normal Mild MR  May 30, 2022 ER EKG reviewed and shows SVT with a rate of 135 bpm.    Recent Labs: 05/30/2022: B Natriuretic Peptide 137.6; Magnesium 1.8; TSH 2.277 05/31/2022: ALT 14; BUN 16; Creatinine, Ser 0.72; Potassium 3.7; Sodium 139 06/02/2022: Hemoglobin 11.9; Platelets 183.0  Recent Lipid Panel    Component Value Date/Time   CHOL 150 05/28/2022 0832   TRIG 86.0 05/28/2022 0832   HDL 54.00 05/28/2022 0832   CHOLHDL 3 05/28/2022 0832   VLDL 17.2 05/28/2022 0832   LDLCALC 79 05/28/2022 0832   LDLDIRECT 103.0 06/16/2017 0834    Physical Exam:    VS:  BP (!) 149/73   Pulse 68   Ht '5\' 5"'$  (1.651 m)   Wt 134 lb 3.2 oz (60.9 kg)   SpO2 94%   BMI 22.33 kg/m     Wt Readings from Last 3 Encounters:  06/03/22 134 lb 3.2 oz (60.9 kg)  06/02/22 134 lb 3.2 oz (60.9 kg)  05/31/22 135 lb 12.9 oz (61.6 kg)     GEN:  Well nourished, well developed in no acute distress HEENT: Normal NECK: No JVD; No carotid bruits LYMPHATICS: No lymphadenopathy CARDIAC: RRR, no murmurs, rubs, gallops RESPIRATORY:  Clear to auscultation without rales, wheezing or rhonchi  ABDOMEN: Soft, non-tender, non-distended MUSCULOSKELETAL:  No edema; No deformity  SKIN: Warm and dry NEUROLOGIC:  Alert and oriented x 3 PSYCHIATRIC:  Normal affect        ASSESSMENT:    1.  SVT (supraventricular tachycardia) ([place])   2. Essential hypertension    PLAN:    In order of problems listed above:   #SVT No evidence of atrial fibrillation thus far.  Continue amiodarone twice daily until 10 September then decrease to once daily dosing.  We will bring back for blood work in 6 to 8 weeks.  She has follow-up scheduled with me in about 2 months.  #Hypertension Above goal today.  Continue monitoring blood pressures at home 1-2 times per week and bring these values to your primary care physician's office for further medication regimen titration.  Follow-up as scheduled.  Total time spent with patient today 30 minutes. This includes reviewing records, evaluating the patient and coordinating care.   Medication Adjustments/Labs and Tests Ordered: Current medicines are reviewed at length with the patient today.  Concerns regarding medicines are outlined above.  No orders of the defined types were placed in this encounter.  No orders of the defined types were placed in this encounter.    Signed, Lars Mage, MD, [facility], [facility] 06/03/2022 2:06 PM    Electrophysiology [facility]

## 2022-06-04 ENCOUNTER — Encounter: Payer: Self-pay | Admitting: Internal Medicine

## 2022-06-09 ENCOUNTER — Other Ambulatory Visit: Payer: Self-pay

## 2022-06-09 ENCOUNTER — Encounter: Payer: Self-pay | Admitting: Internal Medicine

## 2022-06-09 DIAGNOSIS — R195 Other fecal abnormalities: Secondary | ICD-10-CM

## 2022-06-09 LAB — FECAL OCCULT BLOOD, IMMUNOCHEMICAL: Fecal Occult Bld: POSITIVE — AB

## 2022-06-12 NOTE — Telephone Encounter (Signed)
Pt called stating she can not be seen until January for [facility] and she want the provider to send a referral to [place]

## 2022-06-12 NOTE — Telephone Encounter (Signed)
Pended referral to [facility] for approval.

## 2022-06-13 ENCOUNTER — Encounter: Payer: Self-pay | Admitting: Cardiology

## 2022-06-17 ENCOUNTER — Ambulatory Visit (INDEPENDENT_AMBULATORY_CARE_PROVIDER_SITE_OTHER): Payer: Medicare Other | Admitting: Gastroenterology

## 2022-06-17 ENCOUNTER — Telehealth: Payer: Self-pay

## 2022-06-17 ENCOUNTER — Encounter: Payer: Self-pay | Admitting: Gastroenterology

## 2022-06-17 ENCOUNTER — Other Ambulatory Visit: Payer: Self-pay

## 2022-06-17 VITALS — BP 146/71 | HR 72 | Ht 65.0 in | Wt 132.6 lb

## 2022-06-17 DIAGNOSIS — R195 Other fecal abnormalities: Secondary | ICD-10-CM

## 2022-06-17 DIAGNOSIS — K5909 Other constipation: Secondary | ICD-10-CM | POA: Diagnosis not present

## 2022-06-17 MED ORDER — NA SULFATE-K SULFATE-MG SULF 17.5-3.13-1.6 GM/177ML PO SOLN: 354.0000 mL | Freq: Once | ORAL | 0 refills | Status: AC

## 2022-06-17 NOTE — Patient Instructions (Addendum)
If the date does not you can call Caryl Pina at [phone]  Please take Miralax and Metamucil daily   High-Fiber Eating Plan Fiber, also called dietary fiber, is a type of carbohydrate. It is found foods such as fruits, vegetables, whole grains, and beans. A high-fiber diet can have many health benefits. Your health care provider may recommend a high-fiber diet to help: Prevent constipation. Fiber can make your bowel movements more regular. Lower your cholesterol. Relieve the following conditions: Inflammation of veins in the anus (hemorrhoids). Inflammation of specific areas of the digestive tract (uncomplicated diverticulosis). A problem of the large intestine, also called the colon, that sometimes causes pain and diarrhea (irritable bowel syndrome, or IBS). Prevent overeating as part of a weight-loss plan. Prevent heart disease, type 2 diabetes, and certain cancers. What are tips for following this plan? Reading food labels  Check the nutrition facts label on food products for the amount of dietary fiber. Choose foods that have 5 grams of fiber or more per serving. The goals for recommended daily fiber intake include: Men (age 17 or younger): 34-38 g. Men (over age 84): 28-34 g. Women (age 54 or younger): 25-28 g. Women (over age 70): 22-25 g. Your daily fiber goal is _____________ g. Shopping Choose whole fruits and vegetables instead of processed forms, such as apple juice or applesauce. Choose a wide variety of high-fiber foods such as avocados, lentils, oats, and kidney beans. Read the nutrition facts label of the foods you choose. Be aware of foods with added fiber. These foods often have high sugar and sodium amounts per serving. Cooking Use whole-grain flour for baking and cooking. Cook with brown rice instead of white rice. Meal planning Start the day with a breakfast that is high in fiber, such as a cereal that contains 5 g of fiber or more per serving. Eat breads and  cereals that are made with whole-grain flour instead of refined flour or white flour. Eat brown rice, bulgur wheat, or millet instead of white rice. Use beans in place of meat in soups, salads, and pasta dishes. Be sure that half of the grains you eat each day are whole grains. General information You can get the recommended daily intake of dietary fiber by: Eating a variety of fruits, vegetables, grains, nuts, and beans. Taking a fiber supplement if you are not able to take in enough fiber in your diet. It is better to get fiber through food than from a supplement. Gradually increase how much fiber you consume. If you increase your intake of dietary fiber too quickly, you may have bloating, cramping, or gas. Drink plenty of water to help you digest fiber. Choose high-fiber snacks, such as berries, raw vegetables, nuts, and popcorn. What foods should I eat? Fruits Berries. Pears. Apples. Oranges. Avocado. Prunes and raisins. Dried figs. Vegetables Sweet potatoes. Spinach. Kale. Artichokes. Cabbage. Broccoli. Cauliflower. Green peas. Carrots. Squash. Grains Whole-grain breads. Multigrain cereal. Oats and oatmeal. Brown rice. Barley. Bulgur wheat. [place]. Quinoa. Bran muffins. Popcorn. Rye wafer crackers. Meats and other proteins Navy beans, kidney beans, and pinto beans. Soybeans. Split peas. Lentils. Nuts and seeds. Dairy Fiber-fortified yogurt. Beverages Fiber-fortified soy milk. Fiber-fortified orange juice. Other foods Fiber bars. The items listed above may not be a complete list of recommended foods and beverages. Contact a dietitian for more information. What foods should I avoid? Fruits Fruit juice. Cooked, strained fruit. Vegetables Fried potatoes. Canned vegetables. Well-cooked vegetables. Grains White bread. Pasta made with refined flour. White rice. Meats and  other proteins Fatty cuts of meat. Fried chicken or fried fish. Dairy Milk. Yogurt. Cream cheese. Sour  cream. Fats and oils Butters. Beverages Soft drinks. Other foods Cakes and pastries. The items listed above may not be a complete list of foods and beverages to avoid. Talk with your dietitian about what choices are best for you. Summary Fiber is a type of carbohydrate. It is found in foods such as fruits, vegetables, whole grains, and beans. A high-fiber diet has many benefits. It can help to prevent constipation, lower blood cholesterol, aid weight loss, and reduce your risk of heart disease, diabetes, and certain cancers. Increase your intake of fiber gradually. Increasing fiber too quickly may cause cramping, bloating, and gas. Drink plenty of water while you increase the amount of fiber you consume. The best sources of fiber include whole fruits and vegetables, whole grains, nuts, seeds, and beans. This information is not intended to replace advice given to you by your health care provider. Make sure you discuss any questions you have with your health care provider. Document Revised: 01/18/2020 Document Reviewed: 01/18/2020 Elsevier Patient Education  [place].

## 2022-06-17 NOTE — Telephone Encounter (Signed)
   [facility] Pre-operative Risk Assessment    Request for surgical clearance:  What type of surgery is being performed? Colonoscopy    When is this surgery scheduled? 07/09/2022   Are there any medications that need to be held prior to surgery and how long?none    Practice name and name of physician performing surgery? [facility] Dr. Marius Ditch   What is your office phone and fax number? [phone]   Anesthesia type (None, local, MAC, general) ? General    Shelby Mattocks 06/17/2022, 1:50 PM  _________________________________________________________________   (provider comments below)

## 2022-06-17 NOTE — Progress Notes (Signed)
Cephas Darby, MD [address]  [place], [place] [address]  Main: [phone]  Fax: [phone]    Gastroenterology Consultation  Referring Provider:     Crecencio Mc, MD Primary Care Physician:  Crecencio Mc, MD Primary Gastroenterologist:  Dr. Cephas Darby Reason for Consultation:   Fecal occult blood positive        HPI:   Brenda Cardenas is a 88 y.o. female referred by Dr. Crecencio Mc, MD  for consultation & management of fecal occult blood positive.  Patient's PCP performed fecal occult blood because of anemia and it was positive, therefore referred to GI.  She had recent history of SVT on 05/31/2022, admitted to [facility], managed on amiodarone and currently followed by cardiology. Patient reports that when she was giving the stool specimen for FOBT, she strained quite a bit because she was suffering from constipation after she was discharged from the hospital.  She has been taking MiraLAX, that has resulted in diarrhea.  Today has been the first day to have a formed bowel movement.  She lives at [facility], independently, uses cane or walker for support.  Patient does not consume fruits and vegetables on a daily basis because she is worried about the interaction of amiodarone with some fruits.  She cannot have grapefruit at all.  Patient has normocytic anemia, no evidence of iron deficiency, normal B12 and folate levels.  NSAIDs: None  Antiplts/Anticoagulants/Anti thrombotics: None  GI Procedures: EGD and colonoscopy in 2014 by Dr. Erven Colla hiatal hernia, gastric polyps Left-sided diverticulosis, hemorrhoids Part A: STOMACH POLYPS COLD BIOPSY:  - FUNDIC GLAND POLYP.  - NEGATIVE FOR DYSPLASIA AND MALIGNANCY.  .  Part B: STOMACH COLD BIOPSY:  - ANTRAL MUCOSA WITH MILD FOVEOLAR HYPERPLASIA AND MILD CHRONIC  GASTRITIS.  - OXYNTIC MUCOSA WITH MILD CHRONIC GASTRITIS AND INCREASE IN  PARIETAL CELL SIZE WITH APICAL PROTRUSIONS (SEE COMMENT).  -  NEGATIVE FOR DYSPLASIA AND MALIGNANCY.  .  Part C: SIGMOID COLON MUCOSAL COLD BIOPSY:  - COLONIC MUCOSA WITH NO SIGNIFICANT PATHOLOGIC CHANGES.   Past Medical History:  Diagnosis Date   Arthritis    Basal cell carcinoma 06/04/2010   left upper back   Chronic kidney disease    Colon polyps    Gastritis    GERD (gastroesophageal reflux disease)    Hiatal hernia    Hyperlipidemia    Hypertension    IBS (irritable bowel syndrome)    Low back pain radiating to left leg 03/28/2020   Sciatica, left side 04/15/2020   Squamous cell carcinoma of skin 06/26/2013   left mid pretibial   Squamous cell carcinoma of skin 08/21/2015   lower sternum   Squamous cell carcinoma of skin 10/14/2017   right mid pretibial   Squamous cell carcinoma of skin 03/17/2018   right prox lateral elbow   Squamous cell carcinoma of skin 09/07/2018   right post thigh   Squamous cell carcinoma of skin 05/11/2019   right mid med pretibial   Squamous cell carcinoma of skin 06/20/2019   right bicep    Past Surgical History:  Procedure Laterality Date   APPENDECTOMY  1959   CARPAL TUNNEL RELEASE     CATARACT EXTRACTION W/ INTRAOCULAR LENS IMPLANT Left    CATARACT EXTRACTION W/PHACO Right 04/20/2018   Procedure: CATARACT EXTRACTION PHACO AND INTRAOCULAR LENS PLACEMENT ([place])  RIGHT;  Surgeon: Leandrew Koyanagi, MD;  Location: [facility];  Service: Ophthalmology;  Laterality: Right;  CHOLECYSTECTOMY  1995   EYE SURGERY     GANGLION CYST EXCISION     JOINT REPLACEMENT Right 03/2008   [facility]    KNEE ARTHROSCOPY     LITHOTRIPSY     SALIVARY GLAND SURGERY     SKIN CANCER EXCISION     TONSILLECTOMY     TONSILLECTOMY AND ADENOIDECTOMY     VEIN LIGATION       Current Outpatient Medications:    acetaminophen (TYLENOL) 325 MG tablet, Take 650 mg by mouth every 6 (six) hours as needed., Disp: , Rfl:    amiodarone (PACERONE) 200 MG tablet, Take 1 tablet (200 mg total) by mouth daily., Disp: 90 tablet,  Rfl: 3   Calcium Citrate-Vitamin D (CITRACAL + D PO), Take 2 capsules by mouth every other day. Alternates with Vitamin D3 and centrum silver, Disp: , Rfl:    Cholecalciferol (VITAMIN D) 50 MCG (2000 UT) CAPS, Take 1 capsule by mouth every other day., Disp: , Rfl:    clobetasol ointment (TEMOVATE) 3.53 %, Apply 1 Application topically 2 (two) times daily., Disp: 60 g, Rfl: 1   CRANBERRY PO, Take 2 capsules by mouth daily. , Disp: , Rfl:    estradiol (ESTRACE) 0.1 MG/GM vaginal cream, 1 Applicatorful every other day., Disp: , Rfl:    ezetimibe (ZETIA) 10 MG tablet, Take 1 tablet (10 mg total) by mouth daily., Disp: 90 tablet, Rfl: 3   hyoscyamine (ANASPAZ) 0.125 MG TBDP disintergrating tablet, Place 1 tablet (0.125 mg total) under the tongue every 6 (six) hours as needed (as needed for esophageal spasm)., Disp: 30 tablet, Rfl: 0   metoprolol succinate (TOPROL XL) 25 MG 24 hr tablet, Take 1 tablet (25 mg total) by mouth daily., Disp: 30 tablet, Rfl: 11   Na Sulfate-K Sulfate-Mg Sulf 17.5-3.13-1.6 GM/177ML SOLN, Take 354 mLs by mouth once for 1 dose., Disp: 354 mL, Rfl: 0   omeprazole (PRILOSEC) 20 MG capsule, TAKE 1 CAPSULE BY MOUTH EVERY DAY, Disp: 90 capsule, Rfl: 1   ondansetron (ZOFRAN ODT) 4 MG disintegrating tablet, Take 1 tablet (4 mg total) by mouth every 8 (eight) hours as needed for nausea or vomiting., Disp: 20 tablet, Rfl: 0   Probiotic Product (ALIGN PO), Take 1 tablet by mouth daily. , Disp: , Rfl:    amiodarone (PACERONE) 200 MG tablet, Take 1 tablet (200 mg total) by mouth 2 (two) times daily for 7 days., Disp: 14 tablet, Rfl: 0   Family History  Problem Relation Age of Onset   Heart disease Mother    Arthritis Mother    Diabetes Father      Social History   Tobacco Use   Smoking status: Never   Smokeless tobacco: Never  Vaping Use   Vaping Use: Never used  Substance Use Topics   Alcohol use: No    Alcohol/week: 0.0 standard drinks of alcohol   Drug use: No     Allergies as of 06/17/2022 - Review Complete 06/17/2022  Allergen Reaction Noted   Ciprofloxacin Diarrhea 12/22/2017   Clindamycin/lincomycin Diarrhea 06/03/2016   Sulfa antibiotics Rash 05/02/2013   Augmentin [amoxicillin-pot clavulanate] Diarrhea 05/02/2013   Gabapentin Other (See Comments) 12/01/2021   Naproxen sodium Other (See Comments) 11/13/2015   Prednisone  08/13/2021   Tramadol Diarrhea 06/13/2015   Codeine Nausea Only and Rash 05/02/2013   Doxycycline hyclate Nausea And Vomiting and Nausea Only 06/13/2015   E-mycin [erythromycin] Rash 05/02/2013   Macrobid [nitrofurantoin] Rash 05/02/2013   Nabumetone Rash and Other (  See Comments) 05/02/2013   Nitrofurantoin monohyd macro Rash 06/13/2015   Nsaids Rash 06/13/2015   Phenobarbital Other (See Comments), Rash, and Anxiety 05/02/2013   Statins Rash 06/13/2015    Review of Systems:    All systems reviewed and negative except where noted in HPI.   Physical Exam:  BP (!) 146/71   Pulse 72   Ht '5\' 5"'$  (1.651 m)   Wt 132 lb 9.6 oz (60.1 kg)   BMI 22.07 kg/m  No LMP recorded. Patient is postmenopausal.  General:   Alert,  Well-developed, well-nourished, pleasant and cooperative in NAD Head:  Normocephalic and atraumatic. Eyes:  Sclera clear, no icterus.   Conjunctiva pink. Ears:  Normal auditory acuity. Nose:  No deformity, discharge, or lesions. Mouth:  No deformity or lesions,oropharynx pink & moist. Neck:  Supple; no masses or thyromegaly. Lungs:  Respirations even and unlabored.  Clear throughout to auscultation.   No wheezes, crackles, or rhonchi. No acute distress. Heart:  Regular rate and rhythm; no murmurs, clicks, rubs, or gallops. Abdomen:  Normal bowel sounds. Soft, non-tender and non-distended without masses, hepatosplenomegaly or hernias noted.  No guarding or rebound tenderness.   Rectal: Not performed Msk:  Symmetrical without gross deformities. Good, equal movement & strength bilaterally. Pulses:   Normal pulses noted. Extremities:  No clubbing or edema.  No cyanosis. Neurologic:  Alert and oriented x3;  grossly normal neurologically. Skin:  Intact without significant lesions or rashes. No jaundice. Psych:  Alert and cooperative. Normal mood and affect.  Imaging Studies: Reviewed  Assessment and Plan:   Brenda Cardenas is a 88 y.o. female with history hypertension, hyperlipidemia, history of SVT maintained on amiodarone, chronic GERD, hiatal hernia is seen in consultation for FOBT positive, chronic constipation.  Patient has normocytic anemia, normal iron panel, B12 and folate levels  FOBT positive and chronic constipation Discussed with patient regarding high-fiber diet, information provided Advised her to start taking Metamucil daily MiraLAX half cap To 1 capful daily Discussed with patient regarding the role of colonoscopy for FOBT positive.  I told her that the positive test could be because of straining and irritation of hemorrhoids or she may have a luminal lesion such as a large polyp or cancer.  If she would like to know whether or not she has any underlying malignancy or large colon polyp, she would need a colonoscopy.  Patient tells me that she would like to know the cause of FOBT positive and would like to proceed with colonoscopy.  Will use pediatric colonoscope given her age  I have discussed alternative options, risks & benefits,  which include, but are not limited to, bleeding, infection, perforation,respiratory complication & drug reaction.  The patient agrees with this plan & written consent will be obtained.      Follow up based on the colonoscopy results   Cephas Darby, MD

## 2022-06-22 ENCOUNTER — Telehealth: Payer: Self-pay

## 2022-06-22 ENCOUNTER — Telehealth: Payer: Self-pay | Admitting: *Deleted

## 2022-06-22 NOTE — Telephone Encounter (Signed)
Patient was returning call. Please advise ?

## 2022-06-22 NOTE — Telephone Encounter (Signed)
Pt agreeable to tele pre op appt 06/25/22 @ 9:40, med rec and consent are done.

## 2022-06-22 NOTE — Telephone Encounter (Addendum)
Left message for pt to call back in regard to pre op clearance appt. Pt has appt 07/15/22 with Dr. Saunders Revel, though colonoscopy is 07/09/22. A few things could be done here. We could set up a tele appt as per pre op provider, though this will incur another charge, for which the pt will have 2 appts less than 1 month apart. Or the pt can mover colonoscopy out a bit and see Dr. Saunders Revel as planned.   I will send FYI to requesting office as well.

## 2022-06-22 NOTE — Telephone Encounter (Signed)
Patient would like to know if she can take her Amiodarone the morning of her colonoscopy.  She states she usually takes it with her Metoprolol to keep her heart in rhythm.  Please advise,  Thanks, Sharyn Lull, [place]

## 2022-06-22 NOTE — Telephone Encounter (Signed)
Pt agreeable to tele pre op appt 06/25/22 @ 9:40, med rec and consent are done.     Patient Consent for Virtual Visit        Brenda Cardenas has provided verbal consent on 06/22/2022 for a virtual visit (video or telephone).   CONSENT FOR VIRTUAL VISIT FOR:  Brenda Cardenas  By participating in this virtual visit I agree to the following:  I hereby voluntarily request, consent and authorize [facility] and its employed or contracted physicians, physician assistants, nurse practitioners or other licensed health care professionals (the Practitioner), to provide me with telemedicine health care services (the "Services") as deemed necessary by the treating Practitioner. I acknowledge and consent to receive the Services by the Practitioner via telemedicine. I understand that the telemedicine visit will involve communicating with the Practitioner through live audiovisual communication technology and the disclosure of certain medical information by electronic transmission. I acknowledge that I have been given the opportunity to request an in-person assessment or other available alternative prior to the telemedicine visit and am voluntarily participating in the telemedicine visit.  I understand that I have the right to withhold or withdraw my consent to the use of telemedicine in the course of my care at any time, without affecting my right to future care or treatment, and that the Practitioner or I may terminate the telemedicine visit at any time. I understand that I have the right to inspect all information obtained and/or recorded in the course of the telemedicine visit and may receive copies of available information for a reasonable fee.  I understand that some of the potential risks of receiving the Services via telemedicine include:  Delay or interruption in medical evaluation due to technological equipment failure or disruption; Information transmitted may not be sufficient (e.g. poor  resolution of images) to allow for appropriate medical decision making by the Practitioner; and/or  In rare instances, security protocols could fail, causing a breach of personal health information.  Furthermore, I acknowledge that it is my responsibility to provide information about my medical history, conditions and care that is complete and accurate to the best of my ability. I acknowledge that Practitioner's advice, recommendations, and/or decision may be based on factors not within their control, such as incomplete or inaccurate data provided by me or distortions of diagnostic images or specimens that may result from electronic transmissions. I understand that the practice of medicine is not an exact science and that Practitioner makes no warranties or guarantees regarding treatment outcomes. I acknowledge that a copy of this consent can be made available to me via my patient portal ([facility]), or I can request a printed copy by calling the office of [facility].    I understand that my insurance will be billed for this visit.   I have read or had this consent read to me. I understand the contents of this consent, which adequately explains the benefits and risks of the Services being provided via telemedicine.  I have been provided ample opportunity to ask questions regarding this consent and the Services and have had my questions answered to my satisfaction. I give my informed consent for the services to be provided through the use of telemedicine in my medical care

## 2022-06-22 NOTE — Telephone Encounter (Signed)
   Name: Brenda Cardenas  DOB: 1931-11-10  MRN: [id number]  Primary Cardiologist: Nelva Bush, MD   Preoperative team, please contact this patient and set up a phone call appointment for further preoperative risk assessment. Please obtain consent and complete medication review. Thank you for your help.  I confirm that guidance regarding antiplatelet and oral anticoagulation therapy has been completed and, if necessary, noted below (none requested).    Lenna Sciara, NP 06/22/2022, 12:35 PM [place]

## 2022-06-23 ENCOUNTER — Telehealth: Payer: Self-pay

## 2022-06-23 NOTE — Telephone Encounter (Signed)
Returned patients call. LVM for her to call me back.  Thanks,  [place], [place]

## 2022-06-25 ENCOUNTER — Ambulatory Visit: Payer: Medicare Other | Attending: Cardiovascular Disease | Admitting: Nurse Practitioner

## 2022-06-25 DIAGNOSIS — Z01810 Encounter for preprocedural cardiovascular examination: Secondary | ICD-10-CM | POA: Diagnosis not present

## 2022-06-25 NOTE — Progress Notes (Signed)
Virtual Visit via Telephone Note   Because of Brenda Cardenas co-morbid illnesses, she is at least at moderate risk for complications without adequate follow up.  This format is felt to be most appropriate for this patient at this time.  The patient did not have access to video technology/had technical difficulties with video requiring transitioning to audio format only (telephone).  All issues noted in this document were discussed and addressed.  No physical exam could be performed with this format.  Please refer to the patient's chart for her consent to telehealth for [facility].  Evaluation Performed:  Preoperative cardiovascular risk assessment _____________   Date:  06/25/2022   Patient ID:  Brenda Cardenas, DOB 08/25/1932, Brenda Cardenas Patient Location:  Home Provider location:   Office  Primary Care Provider:  Crecencio Mc, MD Primary Cardiologist:  Brenda Bush, MD  Chief Complaint / Patient Profile   88 y.o. y/o female with a h/o SVT, hypertension, hyperlipidemia, CKD, IBS, hiatal hernia,  basal and squamous cell carcinoma, and GERD who is pending colonoscopy on 07/09/2022 with Dr. Marius Ditch of [facility] and presents today for telephonic preoperative cardiovascular risk assessment.  Past Medical History    Past Medical History:  Diagnosis Date   Arthritis    Basal cell carcinoma 06/04/2010   left upper back   Chronic kidney disease    Colon polyps    Gastritis    GERD (gastroesophageal reflux disease)    Hiatal hernia    Hyperlipidemia    Hypertension    IBS (irritable bowel syndrome)    Low back pain radiating to left leg 03/28/2020   Sciatica, left side 04/15/2020   Squamous cell carcinoma of skin 06/26/2013   left mid pretibial   Squamous cell carcinoma of skin 08/21/2015   lower sternum   Squamous cell carcinoma of skin 10/14/2017   right mid pretibial   Squamous cell carcinoma of skin 03/17/2018   right prox lateral elbow    Squamous cell carcinoma of skin 09/07/2018   right post thigh   Squamous cell carcinoma of skin 05/11/2019   right mid med pretibial   Squamous cell carcinoma of skin 06/20/2019   right bicep   Past Surgical History:  Procedure Laterality Date   APPENDECTOMY  1959   CARPAL TUNNEL RELEASE     CATARACT EXTRACTION W/ INTRAOCULAR LENS IMPLANT Left    CATARACT EXTRACTION W/PHACO Right 04/20/2018   Procedure: CATARACT EXTRACTION PHACO AND INTRAOCULAR LENS PLACEMENT ([place])  RIGHT;  Surgeon: Leandrew Koyanagi, MD;  Location: [facility];  Service: Ophthalmology;  Laterality: Right;   CHOLECYSTECTOMY  1995   EYE SURGERY     GANGLION CYST EXCISION     JOINT REPLACEMENT Right 03/2008   [facility]    KNEE ARTHROSCOPY     LITHOTRIPSY     SALIVARY GLAND SURGERY     SKIN CANCER EXCISION     TONSILLECTOMY     TONSILLECTOMY AND ADENOIDECTOMY     VEIN LIGATION      Allergies  Allergies  Allergen Reactions   Ciprofloxacin Diarrhea   Clindamycin/Lincomycin Diarrhea   Sulfa Antibiotics Rash    Rash in throat   Augmentin [Amoxicillin-Pot Clavulanate] Diarrhea   Gabapentin Other (See Comments)    Irregular heartbeat   Naproxen Sodium Other (See Comments)    Causes gastritis   Prednisone     Atrial Fibrillation   Tramadol Diarrhea    "upset stomach, headache, dizziness, drowsiness"   Codeine Nausea Only and  Rash    Upset stomach   Doxycycline Hyclate Nausea And Vomiting and Nausea Only    Dry heaves.    E-Mycin [Erythromycin] Rash   Macrobid [Nitrofurantoin] Rash    severe   Nabumetone Rash and Other (See Comments)    Upsets liver enzymes   Nitrofurantoin Monohyd Macro Rash   Nsaids Rash    Gastritis, GI ulceration.    Phenobarbital Other (See Comments), Rash and Anxiety    "got wild" Patient becomes very hyper and anxious   Statins Rash    Upsets liver enzymes    History of Present Illness    Brenda Cardenas is a 88 y.o. female who presents via audio/video  conferencing for a telehealth visit today.  Pt was last seen in cardiology clinic on 06/03/2022 by Dr. Quentin Ore.  At that time Brenda Cardenas was doing well.  The patient is now pending procedure as outlined above. Since her last visit, she has been stable from a cardiac standpoint. She denies chest pain, palpitations, dyspnea, pnd, orthopnea, n, v, dizziness, syncope, edema, weight gain, or early satiety. All other systems reviewed and are otherwise negative except as noted above.   Home Medications    Prior to Admission medications   Medication Sig Start Date End Date Taking? Authorizing Provider  acetaminophen (TYLENOL) 325 MG tablet Take 650 mg by mouth every 6 (six) hours as needed.    [provider]  amiodarone (PACERONE) 200 MG tablet Take 1 tablet (200 mg total) by mouth 2 (two) times daily for 7 days. 05/31/22 06/07/22  Jennye Boroughs, MD  amiodarone (PACERONE) 200 MG tablet Take 1 tablet (200 mg total) by mouth daily. 06/07/22   Jennye Boroughs, MD  Calcium Citrate-Vitamin D (CITRACAL + D PO) Take 2 capsules by mouth every other day. Alternates with Vitamin D3 and centrum silver    [provider]  Cholecalciferol (VITAMIN D) 50 MCG (2000 UT) CAPS Take 1 capsule by mouth every other day.    [provider]  clobetasol ointment (TEMOVATE) 0.17 % Apply 1 Application topically 2 (two) times daily. 05/13/22   Brenda Mc, MD  CRANBERRY PO Take 2 capsules by mouth daily.     [provider]  estradiol (ESTRACE) 0.1 MG/GM vaginal cream 1 Applicatorful every other day.    [provider]  ezetimibe (ZETIA) 10 MG tablet Take 1 tablet (10 mg total) by mouth daily. 01/13/22   Brenda Mc, MD  hyoscyamine (ANASPAZ) 0.125 MG TBDP disintergrating tablet Place 1 tablet (0.125 mg total) under the tongue every 6 (six) hours as needed (as needed for esophageal spasm). 01/11/20   Brenda Mc, MD  metoprolol succinate (TOPROL XL) 25 MG 24 hr tablet Take 1 tablet  (25 mg total) by mouth daily. 05/20/22   Vickie Epley, MD  omeprazole (PRILOSEC) 20 MG capsule TAKE 1 CAPSULE BY MOUTH EVERY DAY 02/19/22   Brenda Mc, MD  ondansetron (ZOFRAN ODT) 4 MG disintegrating tablet Take 1 tablet (4 mg total) by mouth every 8 (eight) hours as needed for nausea or vomiting. 07/04/21   Brenda Mc, MD  Probiotic Product (ALIGN PO) Take 1 tablet by mouth daily.     [provider]    Physical Exam    Vital Signs:  Brenda Cardenas does not have vital signs available for review today.  Given telephonic nature of communication, physical exam is limited. AAOx3. NAD. Normal affect.  Speech and respirations are unlabored.  Accessory Clinical Findings  None  Assessment & Plan    1.  Preoperative Cardiovascular Risk Assessment:  According to the Revised Cardiac Risk Index (RCRI), her Perioperative Risk of Major Cardiac Event is (%): 0.4. Her Functional Capacity in METs is: 4.64 according to the Duke Activity Status Index (DASI).Therefore, based on ACC/AHA guidelines, patient would be at acceptable risk for the planned procedure without further cardiovascular testing.  The patient was advised that if she develops new symptoms prior to surgery to contact our office to arrange for a follow-up visit, and she verbalized understanding.   A copy of this note will be routed to requesting surgeon.  Time:   Today, I have spent 20 minutes with the patient with telehealth technology discussing medical history, symptoms, and management plan.     Lenna Sciara, NP  06/25/2022, 10:16 AM

## 2022-06-29 ENCOUNTER — Telehealth: Payer: Self-pay

## 2022-06-29 ENCOUNTER — Telehealth: Payer: Self-pay | Admitting: Internal Medicine

## 2022-06-29 MED ORDER — MOLNUPIRAVIR EUA 200MG CAPSULE: 4.0000 | ORAL_CAPSULE | Freq: Two times a day (BID) | ORAL | 0 refills | Status: AC

## 2022-06-29 NOTE — Telephone Encounter (Signed)
Pt c/o medication issue:  1. Name of Medication: Any  2. How are you currently taking this medication (dosage and times per day)?   3. Are you having a reaction (difficulty breathing--STAT)?   4. What is your medication issue?  Pt would like advice as to what she can take now that she has tested positive for covid. Requesting call back.

## 2022-06-29 NOTE — Telephone Encounter (Signed)
Patient states she tested positive for covid this morning.  Patient states her cardiologist, Dr. Saunders Revel, said she cannot take paxlovid.  Patient states she would like to know what she should do and if there is anything she should be taking.   Patient states she has already rescheduled her colonoscopy for 07/30/2022.

## 2022-06-29 NOTE — Telephone Encounter (Signed)
[facility]IR SENT TO [facility] FOR COVID INFECTION,,

## 2022-06-29 NOTE — Telephone Encounter (Signed)
Patient has tested Positive for COVID.  She has been advised that we will need to reschedule her colonoscopy.  Colonoscopy R/S to  07/30/22.    Thanks,  [place], [place]

## 2022-06-29 NOTE — Telephone Encounter (Signed)
I spoke with the patient. She states she started with a stuffy nose yesterday- she tested negative for COVID. She retested twice this morning and was positive.  I have advised her of Dr. Darnelle Bos recommendations as stated below.  The patient also inquired if she could take flonase with her amiodarone & metoprolol.  I advised the patient I could find no contraindication between flonase and amiodarone or metoprolol. She is aware that only the Paxlovid is contraindicated with Amiodarone use.  She is advised to reach out to Dr. Derrel Nip for further recommendations for treatment, but will continue with flonase/ tylenol/ fluids as needed.  The patient voices understanding of the above and is agreeable.

## 2022-06-29 NOTE — Telephone Encounter (Signed)
Spoke with pt to let her know that the covid antiviral has been sent to [place]. Pt gave a verbal understanding.

## 2022-06-29 NOTE — Telephone Encounter (Signed)
I recommend that Brenda Cardenas reach out to her PCP for further recommendations regarding treatment for COVID.  It should be noted that Paxlovid, which is sometimes prescribed for treatment of COVID-19, is contraindicated with amiodarone use.  I am not sure if there would be other COVID-19 specific treatments available to her.  Brenda Bush, MD [facility]

## 2022-06-29 NOTE — Telephone Encounter (Signed)
Patient wanted to notify Dr. Marius Ditch that she has a bruise on her buttocks both left and right side that spreads into her pubic area.  This is a result of the kitchen chair slipping as she was holding on to it trying to pick up something from the floor.  As the chair slid it caused her to bottom on the floor.  This is just an [place].  She did not want you to be alarmed when you saw the bruise.  Thanks,  [place], [place]

## 2022-06-30 NOTE — Telephone Encounter (Signed)
Patient states she has a lot of allergies and a heart problem.  Patient states the covid medication (molnupiravir EUA (LAGEVRIO) 200 mg CAPS capsule) is relatively new and the brochure states there are a lot of possible serious reactions to taking this medication, like rapid heartbeat.  Patient states she is afraid to take this medication.  Patient states her pharmacist said she can't advise her and suggested that patient talk with her PCP.

## 2022-06-30 NOTE — Telephone Encounter (Signed)
Spoke with Brenda Cardenas to let her know that since she has lots of questions about the medication that she will need to schedule an appt with Dr. Derrel Nip. Brenda Cardenas has been scheduled for tomorrow at 4:45.

## 2022-07-01 ENCOUNTER — Encounter: Payer: Self-pay | Admitting: Internal Medicine

## 2022-07-01 ENCOUNTER — Ambulatory Visit (INDEPENDENT_AMBULATORY_CARE_PROVIDER_SITE_OTHER): Payer: Medicare Other | Admitting: Internal Medicine

## 2022-07-01 DIAGNOSIS — I2489 Other forms of acute ischemic heart disease: Secondary | ICD-10-CM | POA: Diagnosis not present

## 2022-07-01 DIAGNOSIS — U071 COVID-19: Secondary | ICD-10-CM

## 2022-07-01 NOTE — Progress Notes (Unsigned)
Virtual Visit via [place]   Note    This format is felt to be most appropriate for this patient at this time.  All issues noted in this document were discussed and addressed.  No physical exam was performed (except for noted visual exam findings with Video Visits).   I connected withNAME on 07/01/22 at  4:45 PM EDT by a video enabled telemedicine application or telephone and verified that I am speaking with the correct person using two identifiers. Location patient: home Location provider: work or home office Persons participating in the virtual visit: patient, provider  I discussed the limitations, risks, security and privacy concerns of performing an evaluation and management service by telephone and the availability of in person appointments. I also discussed with the patient that there may be a patient responsible charge related to this service. The patient expressed understanding and agreed to proceed.  Reason for visit: COVID infection   HPI:  88 yr old female with history of  SVT managed with amiodarone and metoprolol presents with recent COVID infection  5 days ago. Symptoms are mild.  She was prescribed molnupiravir but has been concerned about  potential side effects of tachycardia .  She has a mild cough,  mild sinus congestion . She has been quarantining since Monday a nd per [facility] has to retest on Sunday before she can enter any [facility]    ROS: See pertinent positives and negatives per HPI.  Past Medical History:  Diagnosis Date   Arthritis    Basal cell carcinoma 06/04/2010   left upper back   Chronic kidney disease    Colon polyps    Gastritis    GERD (gastroesophageal reflux disease)    Hiatal hernia    Hyperlipidemia    Hypertension    IBS (irritable bowel syndrome)    Low back pain radiating to left leg 03/28/2020   Sciatica, left side 04/15/2020   Squamous cell carcinoma of skin 06/26/2013   left mid pretibial   Squamous cell carcinoma of skin  08/21/2015   lower sternum   Squamous cell carcinoma of skin 10/14/2017   right mid pretibial   Squamous cell carcinoma of skin 03/17/2018   right prox lateral elbow   Squamous cell carcinoma of skin 09/07/2018   right post thigh   Squamous cell carcinoma of skin 05/11/2019   right mid med pretibial   Squamous cell carcinoma of skin 06/20/2019   right bicep    Past Surgical History:  Procedure Laterality Date   APPENDECTOMY  1959   CARPAL TUNNEL RELEASE     CATARACT EXTRACTION W/ INTRAOCULAR LENS IMPLANT Left    CATARACT EXTRACTION W/PHACO Right 04/20/2018   Procedure: CATARACT EXTRACTION PHACO AND INTRAOCULAR LENS PLACEMENT ([place])  RIGHT;  Surgeon: Leandrew Koyanagi, MD;  Location: [place];  Service: Ophthalmology;  Laterality: Right;   CHOLECYSTECTOMY  1995   EYE SURGERY     GANGLION CYST EXCISION     JOINT REPLACEMENT Right 03/2008   [place]    KNEE ARTHROSCOPY     LITHOTRIPSY     SALIVARY GLAND SURGERY     SKIN CANCER EXCISION     TONSILLECTOMY     TONSILLECTOMY AND ADENOIDECTOMY     VEIN LIGATION      Family History  Problem Relation Age of Onset   Heart disease Mother    Arthritis Mother    Diabetes Father     SOCIAL HX:  reports that she has never smoked.  She has never used smokeless tobacco. She reports that she does not drink alcohol and does not use drugs.    Current Outpatient Medications:    acetaminophen (TYLENOL) 325 MG tablet, Take 650 mg by mouth every 6 (six) hours as needed., Disp: , Rfl:    amiodarone (PACERONE) 200 MG tablet, Take 1 tablet (200 mg total) by mouth daily., Disp: 90 tablet, Rfl: 3   Calcium Citrate-Vitamin D (CITRACAL + D PO), Take 2 capsules by mouth every other day. Alternates with Vitamin D3 and centrum silver, Disp: , Rfl:    Cholecalciferol (VITAMIN D) 50 MCG (2000 UT) CAPS, Take 1 capsule by mouth every other day., Disp: , Rfl:    clobetasol ointment (TEMOVATE) 8.10 %, Apply 1 Application topically 2 (two) times  daily., Disp: 60 g, Rfl: 1   CRANBERRY PO, Take 2 capsules by mouth daily. , Disp: , Rfl:    estradiol (ESTRACE) 0.1 MG/GM vaginal cream, 1 Applicatorful every other day., Disp: , Rfl:    ezetimibe (ZETIA) 10 MG tablet, Take 1 tablet (10 mg total) by mouth daily., Disp: 90 tablet, Rfl: 3   hyoscyamine (ANASPAZ) 0.125 MG TBDP disintergrating tablet, Place 1 tablet (0.125 mg total) under the tongue every 6 (six) hours as needed (as needed for esophageal spasm)., Disp: 30 tablet, Rfl: 0   metoprolol succinate (TOPROL XL) 25 MG 24 hr tablet, Take 1 tablet (25 mg total) by mouth daily., Disp: 30 tablet, Rfl: 11   omeprazole (PRILOSEC) 20 MG capsule, TAKE 1 CAPSULE BY MOUTH EVERY DAY, Disp: 90 capsule, Rfl: 1   ondansetron (ZOFRAN ODT) 4 MG disintegrating tablet, Take 1 tablet (4 mg total) by mouth every 8 (eight) hours as needed for nausea or vomiting., Disp: 20 tablet, Rfl: 0   Probiotic Product (ALIGN PO), Take 1 tablet by mouth daily. , Disp: , Rfl:    molnupiravir EUA (LAGEVRIO) 200 mg CAPS capsule, Take 4 capsules (800 mg total) by mouth 2 (two) times daily for 5 days. (Patient not taking: Reported on 07/01/2022), Disp: 40 capsule, Rfl: 0  EXAM:  VITALS per patient if applicable:   General impression: alert, cooperative and articulate.  No signs of being in distress  Lungs: speech is fluent sentence length suggests that patient is not short of breath and not punctuated by cough, sneezing or sniffing. Marland Kitchen   Psych: affect normal.  speech is articulate and non pressured .  De  ASSESSMENT AND PLAN:  Discussed the following assessment and plan:  No diagnosis found.  No problem-specific Assessment & Plan notes found for this encounter.    I discussed the assessment and treatment plan with the patient. The patient was provided an opportunity to ask questions and all were answered. The patient agreed with the plan and demonstrated an understanding of the instructions.   The patient was advised to  call back or seek an in-person evaluation if the symptoms worsen or if the condition fails to improve as anticipated.   I spent 30 minutes dedicated to the care of this patient on the date of this encounter to include pre-visit review of his medical history,  Face-to-face time with the patient , and post visit ordering of testing and therapeutics.    Crecencio Mc, MD

## 2022-07-02 DIAGNOSIS — U071 COVID-19: Secondary | ICD-10-CM | POA: Insufficient documentation

## 2022-07-02 NOTE — Assessment & Plan Note (Signed)
Patient  reassured that, given her mild symptoms,  No antiviral medication is needed .  Supportive care outlined.

## 2022-07-03 ENCOUNTER — Encounter: Payer: Self-pay | Admitting: Internal Medicine

## 2022-07-08 ENCOUNTER — Ambulatory Visit: Payer: Medicare Other | Admitting: Cardiology

## 2022-07-09 ENCOUNTER — Encounter: Payer: Self-pay | Admitting: Internal Medicine

## 2022-07-09 DIAGNOSIS — D649 Anemia, unspecified: Secondary | ICD-10-CM

## 2022-07-09 DIAGNOSIS — I10 Essential (primary) hypertension: Secondary | ICD-10-CM

## 2022-07-10 ENCOUNTER — Encounter: Payer: Self-pay | Admitting: Internal Medicine

## 2022-07-13 NOTE — Telephone Encounter (Signed)
See previous my chart

## 2022-07-15 ENCOUNTER — Other Ambulatory Visit
Admission: RE | Admit: 2022-07-15 | Discharge: 2022-07-15 | Disposition: A | Discharge status: Home / Self Care | Payer: Medicare Other | Attending: Cardiology | Admitting: Cardiology

## 2022-07-15 ENCOUNTER — Ambulatory Visit: Payer: Medicare Other | Attending: Internal Medicine | Admitting: Internal Medicine

## 2022-07-15 ENCOUNTER — Encounter: Payer: Self-pay | Admitting: Internal Medicine

## 2022-07-15 VITALS — BP 112/70 | HR 63 | Ht 65.0 in | Wt 131.0 lb

## 2022-07-15 DIAGNOSIS — E782 Mixed hyperlipidemia: Secondary | ICD-10-CM | POA: Insufficient documentation

## 2022-07-15 DIAGNOSIS — I10 Essential (primary) hypertension: Secondary | ICD-10-CM | POA: Insufficient documentation

## 2022-07-15 DIAGNOSIS — I471 Supraventricular tachycardia, unspecified: Secondary | ICD-10-CM | POA: Insufficient documentation

## 2022-07-15 LAB — COMPREHENSIVE METABOLIC PANEL
ALT: 19 U/L (ref 0–44)
AST: 21 U/L (ref 15–41)
Albumin: 3.7 g/dL (ref 3.5–5.0)
Alkaline Phosphatase: 56 U/L (ref 38–126)
Anion gap: 5 (ref 5–15)
BUN: 28 mg/dL — ABNORMAL HIGH (ref 8–23)
CO2: 26 mmol/L (ref 22–32)
Calcium: 9.3 mg/dL (ref 8.9–10.3)
Chloride: 110 mmol/L (ref 98–111)
Creatinine, Ser: 0.91 mg/dL (ref 0.44–1.00)
GFR, Estimated: >60 mL/min (ref >60)
Glucose, Bld: 98 mg/dL (ref 70–99)
Potassium: 4.6 mmol/L (ref 3.5–5.1)
Sodium: 141 mmol/L (ref 135–145)
Total Bilirubin: 0.9 mg/dL (ref 0.3–1.2)
Total Protein: 6.3 g/dL — ABNORMAL LOW (ref 6.5–8.1)

## 2022-07-15 LAB — TSH: TSH: 1.14 u[IU]/mL (ref 0.350–4.500)

## 2022-07-15 LAB — T4, FREE: Free T4: 1.26 ng/dL — ABNORMAL HIGH (ref 0.61–1.12)

## 2022-07-15 NOTE — Progress Notes (Signed)
Follow-up Outpatient Visit Date: 07/15/2022  Primary Care Provider: Crecencio Mc, MD [address] [place] [address]  Chief Complaint: Follow-up SVT  HPI:  Brenda Cardenas is a 88 y.o. female with history of SVT, hypertension, hyperlipidemia, GERD, IBS, arthritis, and low back pain, who presents for follow-up of SVT.  I last saw her in April, which time she continued to note occasional palpitations responsive to vagal maneuvers and additional metoprolol.  She presented to the ED again in mid July with SVT, with conversion to sinus rhythm with adenosine.  She was referred back to Dr. Quentin Ore, who recommended addition of amiodarone.  She was back in the ED in early September with recurrent SVT but has not had any further palpitations since.  She denies chest pain, shortness of breath, lightheadedness, and edema (she chronically wears compression stockings and notes that her left calf has always been a bit bigger than the right).  She contracted COVID-19 in the late September but reports only mild URI symptoms.  She is tolerating metoprolol and amiodarone well.  She is planning to get once labs ordered by Dr. Quentin Ore drawn next week.  She notes that her colonoscopy has been postponed from this month to next month due to her recent COVID-19 infection.  --------------------------------------------------------------------------------------------------  Past Medical History:  Diagnosis Date   Arthritis    Basal cell carcinoma 06/04/2010   left upper back   Chronic kidney disease    Colon polyps    Gastritis    GERD (gastroesophageal reflux disease)    Hiatal hernia    Hyperlipidemia    Hypertension    IBS (irritable bowel syndrome)    Low back pain radiating to left leg 03/28/2020   Sciatica, left side 04/15/2020   Squamous cell carcinoma of skin 06/26/2013   left mid pretibial   Squamous cell carcinoma of skin 08/21/2015   lower sternum   Squamous cell carcinoma of skin  10/14/2017   right mid pretibial   Squamous cell carcinoma of skin 03/17/2018   right prox lateral elbow   Squamous cell carcinoma of skin 09/07/2018   right post thigh   Squamous cell carcinoma of skin 05/11/2019   right mid med pretibial   Squamous cell carcinoma of skin 06/20/2019   right bicep   Supraventricular tachycardia    Past Surgical History:  Procedure Laterality Date   APPENDECTOMY  1959   CARPAL TUNNEL RELEASE     CATARACT EXTRACTION W/ INTRAOCULAR LENS IMPLANT Left    CATARACT EXTRACTION W/PHACO Right 04/20/2018   Procedure: CATARACT EXTRACTION PHACO AND INTRAOCULAR LENS PLACEMENT ([place])  RIGHT;  Surgeon: Leandrew Koyanagi, MD;  Location: [place];  Service: Ophthalmology;  Laterality: Right;   CHOLECYSTECTOMY  1995   EYE SURGERY     GANGLION CYST EXCISION     JOINT REPLACEMENT Right 03/2008   [place]    KNEE ARTHROSCOPY     LITHOTRIPSY     SALIVARY GLAND SURGERY     SKIN CANCER EXCISION     TONSILLECTOMY     TONSILLECTOMY AND ADENOIDECTOMY     VEIN LIGATION       Recent CV Pertinent Labs: Lab Results  Component Value Date   CHOL 150 05/28/2022   HDL 54.00 05/28/2022   LDLCALC 79 05/28/2022   LDLDIRECT 103.0 06/16/2017   TRIG 86.0 05/28/2022   CHOLHDL 3 05/28/2022   INR 1.1 05/30/2022   BNP 137.6 (H) 05/30/2022   K 3.7 05/31/2022   MG  1.8 05/30/2022   BUN 16 05/31/2022   BUN 32 (H) 10/10/2021   CREATININE 0.72 05/31/2022    Past medical and surgical history were reviewed and updated in EPIC.  Current Meds  Medication Sig   acetaminophen (TYLENOL) 325 MG tablet Take 650 mg by mouth every 6 (six) hours as needed.   amiodarone (PACERONE) 200 MG tablet Take 1 tablet (200 mg total) by mouth daily.   Calcium Citrate-Vitamin D (CITRACAL + D PO) Take 2 capsules by mouth every other day. Alternates with Vitamin D3   Cholecalciferol (VITAMIN D) 50 MCG (2000 UT) CAPS Take 1 capsule by mouth every other day.   clobetasol ointment (TEMOVATE)  7.34 % Apply 1 Application topically 2 (two) times daily.   CRANBERRY PO Take 2 capsules by mouth daily.    estradiol (ESTRACE) 0.1 MG/GM vaginal cream 1 Applicatorful every other day.   ezetimibe (ZETIA) 10 MG tablet Take 1 tablet (10 mg total) by mouth daily.   hyoscyamine (ANASPAZ) 0.125 MG TBDP disintergrating tablet Place 1 tablet (0.125 mg total) under the tongue every 6 (six) hours as needed (as needed for esophageal spasm).   metoprolol succinate (TOPROL XL) 25 MG 24 hr tablet Take 1 tablet (25 mg total) by mouth daily.   omeprazole (PRILOSEC) 20 MG capsule TAKE 1 CAPSULE BY MOUTH EVERY DAY   ondansetron (ZOFRAN ODT) 4 MG disintegrating tablet Take 1 tablet (4 mg total) by mouth every 8 (eight) hours as needed for nausea or vomiting.   Probiotic Product (ALIGN PO) Take 1 tablet by mouth daily.     Allergies: Ciprofloxacin, Clindamycin/lincomycin, Sulfa antibiotics, Augmentin [amoxicillin-pot clavulanate], Gabapentin, Lidocaine, Naproxen sodium, Prednisone, Tramadol, Codeine, Doxycycline hyclate, E-mycin [erythromycin], Macrobid [nitrofurantoin], Nabumetone, Nitrofurantoin monohyd macro, Nsaids, Phenobarbital, and Statins  Social History   Tobacco Use   Smoking status: Never   Smokeless tobacco: Never  Vaping Use   Vaping Use: Never used  Substance Use Topics   Alcohol use: No    Alcohol/week: 0.0 standard drinks of alcohol   Drug use: No    Family History  Problem Relation Age of Onset   Heart disease Mother    Arthritis Mother    Diabetes Father     Review of Systems: A 12-system review of systems was performed and was negative except as noted in the HPI.  --------------------------------------------------------------------------------------------------  Physical Exam: BP 112/70 (BP Location: Left Arm, Patient Position: Sitting, Cuff Size: Normal)   Pulse 63   Ht '5\' 5"'$  (1.651 m)   Wt 131 lb (59.4 kg)   SpO2 97%   BMI 21.80 kg/m   General:  NAD. Neck: No JVD or  HJR. Lungs: Clear to auscultation bilaterally without wheezes or crackles. Heart: Regular rate and rhythm without murmurs, rubs, or gallops. Abdomen: Soft, nontender, nondistended. Extremities: No lower extremity edema with compression stockings in place.  EKG: Normal sinus rhythm with possible left atrial enlargement, left axis deviation, and borderline low voltage.  No significant change from prior tracing on 05/31/2022.  Lab Results  Component Value Date   WBC 9.3 06/02/2022   HGB 11.9 (L) 06/02/2022   HCT 35.5 (L) 06/02/2022   MCV 97.2 06/02/2022   PLT 183.0 06/02/2022    Lab Results  Component Value Date   NA 139 05/31/2022   K 3.7 05/31/2022   CL 107 05/31/2022   CO2 26 05/31/2022   BUN 16 05/31/2022   CREATININE 0.72 05/31/2022   GLUCOSE 113 (H) 05/31/2022   ALT 14 05/31/2022  Lab Results  Component Value Date   CHOL 150 05/28/2022   HDL 54.00 05/28/2022   LDLCALC 79 05/28/2022   LDLDIRECT 103.0 06/16/2017   TRIG 86.0 05/28/2022   CHOLHDL 3 05/28/2022    --------------------------------------------------------------------------------------------------  ASSESSMENT AND PLAN: SVT: Symptoms have been quiescent since early September on current regimen of amiodarone and metoprolol managed by Dr. Quentin Ore.  I encouraged Ms. Sova to have her surveillance labs drawn either today (since she is already here) or next week.  She is scheduled for follow-up with Dr. Quentin Ore next month.  Hypertension: Blood pressure well controlled today.  Continue current dose of metoprolol.  Hyperlipidemia: Lipids reasonable on last check in August.  Continue ezetimibe at the direction of Dr. Derrel Nip.  Follow-up: Return to clinic in 1 year.  Nelva Bush, MD 07/15/2022 9:13 AM

## 2022-07-15 NOTE — Patient Instructions (Signed)
Medication Instructions:  Your physician recommends that you continue on your current medications as directed. Please refer to the Current Medication list given to you today. *If you need a refill on your cardiac medications before your next appointment, please call your pharmacy*   Lab Work: You may do your lab work today If you have labs (blood work) drawn today and your tests are completely normal, you will receive your results only by: MyChart Message (if you have MyChart) OR A paper copy in the mail If you have any lab test that is abnormal or we need to change your treatment, we will call you to review the results.   Testing/Procedures: None  Follow-Up: At [facility], you and your health needs are our priority.  As part of our continuing mission to provide you with exceptional heart care, we have created designated Provider Care Teams.  These Care Teams include your primary Cardiologist (physician) and Advanced Practice Providers (APPs -  Physician Assistants and Nurse Practitioners) who all work together to provide you with the care you need, when you need it.  We recommend signing up for the patient portal called "MyChart".  Sign up information is provided on this After Visit Summary.  MyChart is used to connect with patients for Virtual Visits (Telemedicine).  Patients are able to view lab/test results, encounter notes, upcoming appointments, etc.  Non-urgent messages can be sent to your provider as well.   To learn more about what you can do with MyChart, go to [email or web address].    Your next appointment:   1 year(s)  The format for your next appointment:   In Person  Provider:   You may see Nelva Bush, MD or one of the following Advanced Practice Providers on your designated Care Team:   Murray Hodgkins, NP Christell Faith, PA-C Cadence Kathlen Mody, PA-C Gerrie Nordmann, NP    Other Instructions N/A  Important Information About [place]

## 2022-07-18 NOTE — Addendum Note (Signed)
Addended by: Crecencio Mc on: 07/18/2022 04:23 PM   Modules accepted: Orders
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
## 2022-07-24 ENCOUNTER — Encounter: Payer: Self-pay | Admitting: Cardiology

## 2022-07-28 ENCOUNTER — Telehealth: Payer: Self-pay

## 2022-07-28 NOTE — Telephone Encounter (Signed)
Opened in error.  Sharyn Lull, CMA

## 2022-07-28 NOTE — Telephone Encounter (Signed)
Patient has been advised per Dr. Dorothey Baseman advice that her bowel prep is an isotonic solution it will run straight through and have no effects on her heart rate at all.  Thanks,  [place], [place]

## 2022-07-28 NOTE — Telephone Encounter (Signed)
Patient would like to know if the Suprep Bowel Prep will effect her heart rate?  She is very concerned about this due to her history of SVT.  Please advise.  Thanks,  [place], [place]

## 2022-07-28 NOTE — Telephone Encounter (Signed)
Patient has been informed Suprep is an isotonic solution.  It will not have any effects on her heart. It will run straight through her system.  In regards to urinary incontinence- a catheter will not be placed prior to the colonoscopy due to increases the risk for infection.  Chucks pads can be placed on the table.  This advice was given by Magda Paganini, RN in Endo.  Thanks,  [place], [place]

## 2022-07-30 ENCOUNTER — Ambulatory Visit: Payer: Medicare Other | Admitting: Anesthesiology

## 2022-07-30 ENCOUNTER — Telehealth: Payer: Self-pay

## 2022-07-30 ENCOUNTER — Encounter: Payer: Self-pay | Admitting: Internal Medicine

## 2022-07-30 ENCOUNTER — Encounter: Payer: Self-pay | Admitting: Gastroenterology

## 2022-07-30 ENCOUNTER — Encounter
Admission: RE | Disposition: A | Discharge status: Home / Self Care | Payer: Self-pay | Source: Home / Self Care | Attending: Gastroenterology

## 2022-07-30 ENCOUNTER — Ambulatory Visit
Admission: RE | Admit: 2022-07-30 | Discharge: 2022-07-30 | Disposition: A | Discharge status: Home / Self Care | Payer: Medicare Other | Attending: Gastroenterology | Admitting: Gastroenterology

## 2022-07-30 DIAGNOSIS — K589 Irritable bowel syndrome without diarrhea: Secondary | ICD-10-CM | POA: Diagnosis not present

## 2022-07-30 DIAGNOSIS — Z86010 Personal history of colonic polyps: Secondary | ICD-10-CM | POA: Diagnosis not present

## 2022-07-30 DIAGNOSIS — R195 Other fecal abnormalities: Secondary | ICD-10-CM | POA: Insufficient documentation

## 2022-07-30 DIAGNOSIS — K635 Polyp of colon: Secondary | ICD-10-CM

## 2022-07-30 DIAGNOSIS — Z8719 Personal history of other diseases of the digestive system: Secondary | ICD-10-CM | POA: Insufficient documentation

## 2022-07-30 DIAGNOSIS — K219 Gastro-esophageal reflux disease without esophagitis: Secondary | ICD-10-CM | POA: Insufficient documentation

## 2022-07-30 DIAGNOSIS — K5730 Diverticulosis of large intestine without perforation or abscess without bleeding: Secondary | ICD-10-CM | POA: Diagnosis not present

## 2022-07-30 DIAGNOSIS — K644 Residual hemorrhoidal skin tags: Secondary | ICD-10-CM | POA: Insufficient documentation

## 2022-07-30 DIAGNOSIS — E785 Hyperlipidemia, unspecified: Secondary | ICD-10-CM | POA: Insufficient documentation

## 2022-07-30 DIAGNOSIS — Z9049 Acquired absence of other specified parts of digestive tract: Secondary | ICD-10-CM | POA: Diagnosis not present

## 2022-07-30 DIAGNOSIS — I129 Hypertensive chronic kidney disease with stage 1 through stage 4 chronic kidney disease, or unspecified chronic kidney disease: Secondary | ICD-10-CM | POA: Diagnosis not present

## 2022-07-30 DIAGNOSIS — D124 Benign neoplasm of descending colon: Secondary | ICD-10-CM | POA: Diagnosis not present

## 2022-07-30 DIAGNOSIS — Z85828 Personal history of other malignant neoplasm of skin: Secondary | ICD-10-CM | POA: Diagnosis not present

## 2022-07-30 DIAGNOSIS — N189 Chronic kidney disease, unspecified: Secondary | ICD-10-CM | POA: Diagnosis not present

## 2022-07-30 DIAGNOSIS — Z1211 Encounter for screening for malignant neoplasm of colon: Secondary | ICD-10-CM | POA: Insufficient documentation

## 2022-07-30 HISTORY — PX: COLONOSCOPY WITH PROPOFOL: SHX5780

## 2022-07-30 SURGERY — COLONOSCOPY WITH PROPOFOL
Anesthesia: General

## 2022-07-30 MED ORDER — SODIUM CHLORIDE 0.9 % IV SOLN: INTRAVENOUS | Status: DC

## 2022-07-30 MED ORDER — PROPOFOL 500 MG/50ML IV EMUL
INTRAVENOUS | Status: DC | PRN
  Administered 2022-07-30: 125 ug/kg/min via INTRAVENOUS

## 2022-07-30 MED ORDER — PROPOFOL 10 MG/ML IV BOLUS
INTRAVENOUS | Status: DC | PRN
  Administered 2022-07-30: 50 mg via INTRAVENOUS

## 2022-07-30 MED ORDER — LIDOCAINE HCL (CARDIAC) PF 100 MG/5ML IV SOSY
PREFILLED_SYRINGE | INTRAVENOUS | Status: DC | PRN
  Administered 2022-07-30: 60 mg via INTRAVENOUS

## 2022-07-30 NOTE — Telephone Encounter (Signed)
She can have lighter meal and resume regular food  RV

## 2022-07-30 NOTE — Telephone Encounter (Signed)
Patient would like to know what she should do for her stomach pain and dry heaves.  She said she feels rotten.    I advised her to chew on ice chips. She does not want any medicine-please recommend what foods she should eat to ease back into her regular diet.  Thanks,  [place], [place]

## 2022-07-30 NOTE — Anesthesia Postprocedure Evaluation (Signed)
Anesthesia Post Note  Patient: Brenda Cardenas  Procedure(s) Performed: COLONOSCOPY WITH PROPOFOL  Patient location during evaluation: Endoscopy Anesthesia Type: General Level of consciousness: awake and alert Pain management: pain level controlled Vital Signs Assessment: post-procedure vital signs reviewed and stable Respiratory status: spontaneous breathing, nonlabored ventilation and respiratory function stable Cardiovascular status: blood pressure returned to baseline and stable Postop Assessment: no apparent nausea or vomiting Anesthetic complications: no   No notable events documented.   Last Vitals:  Vitals:   07/30/22 1126 07/30/22 1136  BP: 114/77 (!) 141/69  Pulse: 68 71  Resp: 20 19  Temp:    SpO2: 100% 100%    Last Pain:  Vitals:   07/30/22 1136  TempSrc:   PainSc: 0-No pain                 [place] Ouch

## 2022-07-30 NOTE — H&P (Signed)
Cephas Darby, MD [address]  [place]  [place], [place] [address]  Main: [phone]  Fax: [phone] Pager: [phone]  Primary Care Physician:  Crecencio Mc, MD Primary Gastroenterologist:  Dr. Cephas Darby  Pre-Procedure History & Physical: HPI:  Brenda Cardenas is a 88 y.o. female is here for an colonoscopy.   Past Medical History:  Diagnosis Date   Arthritis    Basal cell carcinoma 06/04/2010   left upper back   Chronic kidney disease    Colon polyps    Gastritis    GERD (gastroesophageal reflux disease)    Hiatal hernia    Hyperlipidemia    Hypertension    IBS (irritable bowel syndrome)    Low back pain radiating to left leg 03/28/2020   Sciatica, left side 04/15/2020   Squamous cell carcinoma of skin 06/26/2013   left mid pretibial   Squamous cell carcinoma of skin 08/21/2015   lower sternum   Squamous cell carcinoma of skin 10/14/2017   right mid pretibial   Squamous cell carcinoma of skin 03/17/2018   right prox lateral elbow   Squamous cell carcinoma of skin 09/07/2018   right post thigh   Squamous cell carcinoma of skin 05/11/2019   right mid med pretibial   Squamous cell carcinoma of skin 06/20/2019   right bicep   Supraventricular tachycardia     Past Surgical History:  Procedure Laterality Date   APPENDECTOMY  1959   CARPAL TUNNEL RELEASE     CATARACT EXTRACTION W/ INTRAOCULAR LENS IMPLANT Left    CATARACT EXTRACTION W/PHACO Right 04/20/2018   Procedure: CATARACT EXTRACTION PHACO AND INTRAOCULAR LENS PLACEMENT ([place])  RIGHT;  Surgeon: Leandrew Koyanagi, MD;  Location: [facility];  Service: Ophthalmology;  Laterality: Right;   CHOLECYSTECTOMY  1995   EYE SURGERY     GANGLION CYST EXCISION     JOINT REPLACEMENT Right 03/2008   [facility]    KNEE ARTHROSCOPY     LITHOTRIPSY     SALIVARY GLAND SURGERY     SKIN CANCER EXCISION     TONSILLECTOMY     TONSILLECTOMY AND ADENOIDECTOMY     VEIN LIGATION      Prior to  Admission medications   Medication Sig Start Date End Date Taking? Authorizing Provider  amiodarone (PACERONE) 200 MG tablet Take 1 tablet (200 mg total) by mouth daily. 06/07/22  Yes Jennye Boroughs, MD  Calcium Citrate-Vitamin D (CITRACAL + D PO) Take 2 capsules by mouth every other day. Alternates with Vitamin D3   Yes [provider]  Cholecalciferol (VITAMIN D) 50 MCG (2000 UT) CAPS Take 1 capsule by mouth every other day.   Yes [provider]  CRANBERRY PO Take 2 capsules by mouth daily.    Yes [provider]  estradiol (ESTRACE) 0.1 MG/GM vaginal cream 1 Applicatorful every other day.   Yes [provider]  metoprolol succinate (TOPROL XL) 25 MG 24 hr tablet Take 1 tablet (25 mg total) by mouth daily. 05/20/22  Yes Vickie Epley, MD  omeprazole (PRILOSEC) 20 MG capsule TAKE 1 CAPSULE BY MOUTH EVERY DAY 02/19/22  Yes Crecencio Mc, MD  acetaminophen (TYLENOL) 325 MG tablet Take 650 mg by mouth every 6 (six) hours as needed.    [provider]  clobetasol ointment (TEMOVATE) 2.68 % Apply 1 Application topically 2 (two) times daily. 05/13/22   Crecencio Mc, MD  ezetimibe (ZETIA) 10 MG tablet Take 1 tablet (10 mg total) by  mouth daily. 01/13/22   Crecencio Mc, MD  hyoscyamine (ANASPAZ) 0.125 MG TBDP disintergrating tablet Place 1 tablet (0.125 mg total) under the tongue every 6 (six) hours as needed (as needed for esophageal spasm). 01/11/20   Crecencio Mc, MD  ondansetron (ZOFRAN ODT) 4 MG disintegrating tablet Take 1 tablet (4 mg total) by mouth every 8 (eight) hours as needed for nausea or vomiting. 07/04/21   Crecencio Mc, MD  Probiotic Product (ALIGN PO) Take 1 tablet by mouth daily.     [provider]    Allergies as of 06/17/2022 - Review Complete 06/17/2022  Allergen Reaction Noted   Ciprofloxacin Diarrhea 12/22/2017   Clindamycin/lincomycin Diarrhea 06/03/2016   Sulfa antibiotics Rash 05/02/2013   Augmentin  [amoxicillin-pot clavulanate] Diarrhea 05/02/2013   Gabapentin Other (See Comments) 12/01/2021   Naproxen sodium Other (See Comments) 11/13/2015   Prednisone  08/13/2021   Tramadol Diarrhea 06/13/2015   Codeine Nausea Only and Rash 05/02/2013   Doxycycline hyclate Nausea And Vomiting and Nausea Only 06/13/2015   E-mycin [erythromycin] Rash 05/02/2013   Macrobid [nitrofurantoin] Rash 05/02/2013   Nabumetone Rash and Other (See Comments) 05/02/2013   Nitrofurantoin monohyd macro Rash 06/13/2015   Nsaids Rash 06/13/2015   Phenobarbital Other (See Comments), Rash, and Anxiety 05/02/2013   Statins Rash 06/13/2015    Family History  Problem Relation Age of Onset   Heart disease Mother    Arthritis Mother    Diabetes Father     Social History   Socioeconomic History   Marital status: Single    Spouse name: Not on file   Number of children: Not on file   Years of education: Not on file   Highest education level: Not on file  Occupational History   Not on file  Tobacco Use   Smoking status: Never   Smokeless tobacco: Never  Vaping Use   Vaping Use: Never used  Substance and Sexual Activity   Alcohol use: No    Alcohol/week: 0.0 standard drinks of alcohol   Drug use: No   Sexual activity: Never  Other Topics Concern   Not on file  Social History Narrative   Not on file   Social Determinants of Health   Financial Resource Strain: Low Risk  (01/15/2021)   Overall Financial Resource Strain (CARDIA)    Difficulty of Paying Living Expenses: Not hard at all  Food Insecurity: No Food Insecurity (01/15/2021)   Hunger Vital Sign    Worried About Running Out of Food in the Last Year: Never true    Ran Out of Food in the Last Year: Never true  Transportation Needs: No Transportation Needs (01/15/2021)   PRAPARE - Hydrologist (Medical): No    Lack of Transportation (Non-Medical): No  Physical Activity: Insufficiently Active (01/15/2021)   Exercise  Vital Sign    Days of Exercise per Week: 3 days    Minutes of Exercise per Session: 30 min  Stress: No Stress Concern Present (01/15/2021)   [employer]    Feeling of Stress : Not at all  Social Connections: Unknown (01/15/2021)   Social Connection and Isolation Panel [NHANES]    Frequency of Communication with Friends and Family: More than three times a week    Frequency of Social Gatherings with Friends and Family: Not on file    Attends Religious Services: Not on file    Active Member of Clubs or Organizations: Not  on file    Attends Club or Organization Meetings: Not on file    Marital Status: Not on file  Intimate Partner Violence: Not At Risk (01/15/2021)   Humiliation, Afraid, Rape, and Kick questionnaire    Fear of Current or Ex-Partner: No    Emotionally Abused: No    Physically Abused: No    Sexually Abused: No    Review of Systems: See HPI, otherwise negative ROS  Physical Exam: BP (!) 158/88   Temp (!) 97.4 F (36.3 C) (Temporal)   Resp 16   Ht '5\' 5"'$  (1.651 m)   Wt 58.1 kg   SpO2 100%   BMI 21.30 kg/m  General:   Alert,  pleasant and cooperative in NAD Head:  Normocephalic and atraumatic. Neck:  Supple; no masses or thyromegaly. Lungs:  Clear throughout to auscultation.    Heart:  Regular rate and rhythm. Abdomen:  Soft, nontender and nondistended. Normal bowel sounds, without guarding, and without rebound.   Neurologic:  Alert and  oriented x4;  grossly normal neurologically.  Impression/Plan: Brenda Cardenas is here for an colonoscopy to be performed for fecal occult positive   Risks, benefits, limitations, and alternatives regarding  colonoscopy have been reviewed with the patient.  Questions have been answered.  All parties agreeable.   Sherri Sear, MD  07/30/2022, 10:02 AM

## 2022-07-30 NOTE — Anesthesia Preprocedure Evaluation (Addendum)
Anesthesia Evaluation  Patient identified by MRN, date of birth, ID band Patient awake    Reviewed: Allergy & Precautions, NPO status , Patient's Chart, lab work & pertinent test results  Airway Mallampati: III  TM Distance: >3 FB Neck ROM: full    Dental  (+) Implants   Pulmonary neg pulmonary ROS   Pulmonary exam normal        Cardiovascular Exercise Tolerance: Poor hypertension, Pt. on home beta blockers and Pt. on medications + dysrhythmias (LAFB) Supra Ventricular Tachycardia      Neuro/Psych  PSYCHIATRIC DISORDERS Anxiety      Neuromuscular disease (sciatica)    GI/Hepatic Neg liver ROS, hiatal hernia,GERD  ,,  Endo/Other  negative endocrine ROS    Renal/GU Renal InsufficiencyRenal disease  negative genitourinary   Musculoskeletal   Abdominal   Peds  Hematology  (+) Blood dyscrasia, anemia   Anesthesia Other Findings Past Medical History: No date: Arthritis 06/04/2010: Basal cell carcinoma     Comment:  left upper back No date: Chronic kidney disease No date: Colon polyps No date: Gastritis No date: GERD (gastroesophageal reflux disease) No date: Hiatal hernia No date: Hyperlipidemia No date: Hypertension No date: IBS (irritable bowel syndrome) 03/28/2020: Low back pain radiating to left leg 04/15/2020: Sciatica, left side 06/26/2013: Squamous cell carcinoma of skin     Comment:  left mid pretibial 08/21/2015: Squamous cell carcinoma of skin     Comment:  lower sternum 10/14/2017: Squamous cell carcinoma of skin     Comment:  right mid pretibial 03/17/2018: Squamous cell carcinoma of skin     Comment:  right prox lateral elbow 09/07/2018: Squamous cell carcinoma of skin     Comment:  right post thigh 05/11/2019: Squamous cell carcinoma of skin     Comment:  right mid med pretibial 06/20/2019: Squamous cell carcinoma of skin     Comment:  right bicep No date: Supraventricular tachycardia  Past  Surgical History: 1959: APPENDECTOMY No date: CARPAL TUNNEL RELEASE No date: CATARACT EXTRACTION W/ INTRAOCULAR LENS IMPLANT; Left 04/20/2018: CATARACT EXTRACTION W/PHACO; Right     Comment:  Procedure: CATARACT EXTRACTION PHACO AND INTRAOCULAR               LENS PLACEMENT ([facility])  RIGHT;  Surgeon: Leandrew Koyanagi, MD;  Location: [facility];  Service:               Ophthalmology;  Laterality: Right; 1995: CHOLECYSTECTOMY No date: EYE SURGERY No date: GANGLION CYST EXCISION 03/2008: JOINT REPLACEMENT; Right     Comment:  [facility]  No date: KNEE ARTHROSCOPY No date: LITHOTRIPSY No date: SALIVARY GLAND SURGERY No date: SKIN CANCER EXCISION No date: TONSILLECTOMY No date: TONSILLECTOMY AND ADENOIDECTOMY No date: VEIN LIGATION     Reproductive/Obstetrics negative OB ROS                             Anesthesia Physical Anesthesia Plan  ASA: 3  Anesthesia Plan: General   Post-op Pain Management: Minimal or no pain anticipated   Induction: Intravenous  PONV Risk Score and Plan: Propofol infusion and TIVA  Airway Management Planned: Natural Airway  Additional Equipment:   Intra-op Plan:   Post-operative Plan:   Informed Consent: I have reviewed the patients History and Physical, chart, labs and discussed the procedure including the risks, benefits and alternatives for the proposed anesthesia with the  patient or authorized representative who has indicated his/her understanding and acceptance.     Dental Advisory Given  Plan Discussed with: Anesthesiologist, CRNA and Surgeon  Anesthesia Plan Comments:         Anesthesia Quick Evaluation

## 2022-07-30 NOTE — Transfer of Care (Signed)
Immediate Anesthesia Transfer of Care Note  Patient: Brenda Cardenas  Procedure(s) Performed: COLONOSCOPY WITH PROPOFOL  Patient Location: Endoscopy Unit  Anesthesia Type:General  Level of Consciousness: drowsy  Airway & Oxygen Therapy: Patient Spontanous Breathing  Post-op Assessment: Report given to RN and Post -op Vital signs reviewed and stable  Post vital signs: Reviewed and stable  Last Vitals:  Vitals Value Taken Time  BP    Temp    Pulse 65 07/30/22 1116  Resp 16 07/30/22 1116  SpO2 100 % 07/30/22 1116  Vitals shown include unvalidated device data.  Last Pain:  Vitals:   07/30/22 0942  TempSrc: Temporal  PainSc: 0-No pain         Complications: No notable events documented.

## 2022-07-30 NOTE — Op Note (Signed)
[facility] Gastroenterology Patient Name: Brenda Cardenas Procedure Date: 07/30/2022 10:35 AM MRN: [id number] Account #: [id number] Date of Birth: Sep 11, 1932 Admit Type: Outpatient Age: 88 Room: [facility] ENDO ROOM 4 Gender: Female Note Status: Finalized Instrument Name: Peds Colonoscope [id number] Procedure:             Colonoscopy Indications:           Positive fecal immunochemical test Providers:             Lin Landsman MD, MD Referring MD:          Deborra Medina, MD (Referring MD) Medicines:             General Anesthesia Complications:         No immediate complications. Estimated blood loss: None. Procedure:             Pre-Anesthesia Assessment:                        - Prior to the procedure, a History and Physical was                         performed, and patient medications and allergies were                         reviewed. The patient is competent. The risks and                         benefits of the procedure and the sedation options and                         risks were discussed with the patient. All questions                         were answered and informed consent was obtained.                         Patient identification and proposed procedure were                         verified by the physician, the nurse, the                         anesthesiologist, the anesthetist and the technician                         in the pre-procedure area in the procedure room in the                         endoscopy suite. Mental Status Examination: alert and                         oriented. Airway Examination: normal oropharyngeal                         airway and neck mobility. Respiratory Examination:                         clear to auscultation. CV Examination: normal.  Prophylactic Antibiotics: The patient does not require                         prophylactic antibiotics. Prior Anticoagulants: The                          patient has taken no anticoagulant or antiplatelet                         agents. ASA Grade Assessment: III - A patient with                         severe systemic disease. After reviewing the risks and                         benefits, the patient was deemed in satisfactory                         condition to undergo the procedure. The anesthesia                         plan was to use general anesthesia. Immediately prior                         to administration of medications, the patient was                         re-assessed for adequacy to receive sedatives. The                         heart rate, respiratory rate, oxygen saturations,                         blood pressure, adequacy of pulmonary ventilation, and                         response to care were monitored throughout the                         procedure. The physical status of the patient was                         re-assessed after the procedure.                        After obtaining informed consent, the colonoscope was                         passed under direct vision. Throughout the procedure,                         the patient's blood pressure, pulse, and oxygen                         saturations were monitored continuously. The                         Colonoscope was introduced through the anus and  advanced to the the terminal ileum, with                         identification of the appendiceal orifice and IC                         valve. The colonoscopy was performed with moderate                         difficulty due to significant looping and the                         patient's body habitus. Successful completion of the                         procedure was aided by applying abdominal pressure.                         The patient tolerated the procedure well. The quality                         of the bowel preparation was evaluated using the BBPS                          [facility] Bowel Preparation Scale) with scores of: Right                         Colon = 3, Transverse Colon = 3 and Left Colon = 3                         (entire mucosa seen well with no residual staining,                         small fragments of stool or opaque liquid). The total                         BBPS score equals 9. The terminal ileum, ileocecal                         valve, appendiceal orifice, and rectum were                         photographed. Findings:      The perianal and digital rectal examinations were normal. Pertinent       negatives include normal sphincter tone and no palpable rectal lesions.      The terminal ileum appeared normal.      A diminutive polyp was found in the descending colon. The polyp was       sessile. The polyp was removed with a jumbo cold forceps. Resection and       retrieval were complete.      Multiple small-mouthed diverticula were found in the recto-sigmoid colon       and sigmoid colon.      Non-bleeding external hemorrhoids were found during retroflexion. The       hemorrhoids were small. Impression:            - The examined portion of the ileum was normal.                        -  One diminutive polyp in the descending colon,                         removed with a jumbo cold forceps. Resected and                         retrieved.                        - Diverticulosis in the recto-sigmoid colon and in the                         sigmoid colon.                        - Non-bleeding external hemorrhoids. Recommendation:        - Discharge patient to a nursing home (with escort).                        - High fiber diet.                        - Continue present medications.                        - Await pathology results.                        - Return to my office PRN. Procedure Code(s):     --- Professional ---                        [phone], Colonoscopy, flexible; with biopsy, single or                          multiple Diagnosis Code(s):     --- Professional ---                        K64.4, Residual hemorrhoidal skin tags                        D12.4, Benign neoplasm of descending colon                        R19.5, Other fecal abnormalities                        K57.30, Diverticulosis of large intestine without                         perforation or abscess without bleeding CPT copyright 2022 American Medical Association. All rights reserved. The codes documented in this report are preliminary and upon coder review may  be revised to meet current compliance requirements. Dr. Ulyess Mort Lin Landsman MD, MD 07/30/2022 11:17:04 AM This report has been signed electronically. Number of Addenda: 0 Note Initiated On: 07/30/2022 10:35 AM Scope Withdrawal Time: 0 hours 7 minutes 14 seconds  Total Procedure Duration: 0 hours 16 minutes 33 seconds  Estimated Blood Loss:  Estimated blood loss: none.      [facility]

## 2022-07-31 ENCOUNTER — Encounter: Payer: Self-pay | Admitting: Gastroenterology

## 2022-07-31 LAB — SURGICAL PATHOLOGY

## 2022-08-02 ENCOUNTER — Encounter: Payer: Self-pay | Admitting: Gastroenterology

## 2022-08-03 ENCOUNTER — Encounter: Payer: Self-pay | Admitting: Gastroenterology

## 2022-08-13 ENCOUNTER — Telehealth: Payer: Self-pay

## 2022-08-13 DIAGNOSIS — K529 Noninfective gastroenteritis and colitis, unspecified: Secondary | ICD-10-CM

## 2022-08-13 NOTE — Telephone Encounter (Signed)
Patient verbalized understanding of instructions she states she will pick up the stool kit today

## 2022-08-13 NOTE — Telephone Encounter (Signed)
Brenda Cardenas  If she is having diarrhea, please order GI profile PCR  Thanks RV

## 2022-08-13 NOTE — Telephone Encounter (Signed)
Patient contacted me directly via my cell yesterday after work hours in regards to experiencing bowel leakage, which she stated she could not control.  She does not know why this is occurring.  Also when she has been experiencing a lot of gas pain and has noticed stool on her sheets. She mentioned that after her colonoscopy it took her 2 weeks to have a normal bowel movement.  Patient has been advised that any messages regarding her health cannot and should not be communicated with me via my personal cell and to communicate her healthcare concerns via my chart or call to the office directly.  Please advise,  Thanks,  Sharyn Lull, [place]

## 2022-08-13 NOTE — Addendum Note (Signed)
Addended by: Ulyess Blossom L on: 08/13/2022 09:47 AM   Modules accepted: Orders

## 2022-08-19 ENCOUNTER — Telehealth: Payer: Self-pay

## 2022-08-19 LAB — GI PROFILE, STOOL, PCR

## 2022-08-19 NOTE — Telephone Encounter (Signed)
Patient verbalized understanding of results. She states she is not having no diarrhea anymore.

## 2022-08-19 NOTE — Telephone Encounter (Signed)
-----   Message from Lin Landsman, MD sent at 08/19/2022  2:03 PM EST ----- Please inform patient that the stool studies came back negative for infection.  If she is still having diarrhea, please have her take over-the-counter Imodium 1 pill as needed up to twice daily  RV

## 2022-08-24 ENCOUNTER — Other Ambulatory Visit: Payer: Self-pay | Admitting: Internal Medicine

## 2022-08-25 NOTE — Progress Notes (Unsigned)
Electrophysiology Office Follow up Visit Note:    Date:  08/26/2022   ID:  Brenda Cardenas, DOB 11-27-1931, MRN [id number]  PCP:  Crecencio Mc, MD  [facility] Cardiologist:  Nelva Bush, MD  [facility] Electrophysiologist:  Vickie Epley, MD    Interval History:    Brenda Cardenas is a 88 y.o. female who presents for a follow up visit. They were last seen in clinic 06/03/2022 for SVT. She is on amiodarone. AST/ALT and thyroid studies have been normal on amiodarone. She tells me she has been doing well.  She did have 1 episode of SVT that she took an extra metoprolol for that resolved the symptoms after about an hour or so.  She has developed some lower extremity edema recently.  She thinks it may be due to her being on her feet more.      Past Medical History:  Diagnosis Date   Arthritis    Basal cell carcinoma 06/04/2010   left upper back   Chronic kidney disease    Colon polyps    Gastritis    GERD (gastroesophageal reflux disease)    Hiatal hernia    Hyperlipidemia    Hypertension    IBS (irritable bowel syndrome)    Low back pain radiating to left leg 03/28/2020   Sciatica, left side 04/15/2020   Squamous cell carcinoma of skin 06/26/2013   left mid pretibial   Squamous cell carcinoma of skin 08/21/2015   lower sternum   Squamous cell carcinoma of skin 10/14/2017   right mid pretibial   Squamous cell carcinoma of skin 03/17/2018   right prox lateral elbow   Squamous cell carcinoma of skin 09/07/2018   right post thigh   Squamous cell carcinoma of skin 05/11/2019   right mid med pretibial   Squamous cell carcinoma of skin 06/20/2019   right bicep   Supraventricular tachycardia     Past Surgical History:  Procedure Laterality Date   APPENDECTOMY  1959   CARPAL TUNNEL RELEASE     CATARACT EXTRACTION W/ INTRAOCULAR LENS IMPLANT Left    CATARACT EXTRACTION W/PHACO Right 04/20/2018   Procedure: CATARACT EXTRACTION PHACO AND INTRAOCULAR LENS  PLACEMENT ([place])  RIGHT;  Surgeon: Leandrew Koyanagi, MD;  Location: [place];  Service: Ophthalmology;  Laterality: Right;   CHOLECYSTECTOMY  1995   COLONOSCOPY WITH PROPOFOL N/A 07/30/2022   Procedure: COLONOSCOPY WITH PROPOFOL;  Surgeon: Lin Landsman, MD;  Location: [facility] ENDOSCOPY;  Service: Gastroenterology;  Laterality: N/A;   EYE SURGERY     GANGLION CYST EXCISION     JOINT REPLACEMENT Right 03/2008   [place]    KNEE ARTHROSCOPY     LITHOTRIPSY     SALIVARY GLAND SURGERY     SKIN CANCER EXCISION     TONSILLECTOMY     TONSILLECTOMY AND ADENOIDECTOMY     VEIN LIGATION      Current Medications: Current Meds  Medication Sig   furosemide (LASIX) 20 MG tablet Take 1 tablet (20 mg total) by mouth as needed. Take 1-2 tablets per week as needed for swelling     Allergies:   Ciprofloxacin, Clindamycin/lincomycin, Sulfa antibiotics, Augmentin [amoxicillin-pot clavulanate], Gabapentin, Lidocaine, Naproxen sodium, Prednisone, Tramadol, Codeine, Doxycycline hyclate, E-mycin [erythromycin], Macrobid [nitrofurantoin], Nabumetone, Nitrofurantoin monohyd macro, Nsaids, Phenobarbital, and Statins   Social History   Socioeconomic History   Marital status: Single    Spouse name: Not on file   Number of children: Not on file   Years of education:  Not on file   Highest education level: Not on file  Occupational History   Not on file  Tobacco Use   Smoking status: Never   Smokeless tobacco: Never  Vaping Use   Vaping Use: Never used  Substance and Sexual Activity   Alcohol use: No    Alcohol/week: 0.0 standard drinks of alcohol   Drug use: No   Sexual activity: Never  Other Topics Concern   Not on file  Social History Narrative   Not on file   Social Determinants of Health   Financial Resource Strain: Low Risk  (01/15/2021)   Overall Financial Resource Strain (CARDIA)    Difficulty of Paying Living Expenses: Not hard at all  Food Insecurity: No Food Insecurity  (01/15/2021)   Hunger Vital Sign    Worried About Running Out of Food in the Last Year: Never true    Ran Out of Food in the Last Year: Never true  Transportation Needs: No Transportation Needs (01/15/2021)   PRAPARE - Hydrologist (Medical): No    Lack of Transportation (Non-Medical): No  Physical Activity: Insufficiently Active (01/15/2021)   Exercise Vital Sign    Days of Exercise per Week: 3 days    Minutes of Exercise per Session: 30 min  Stress: No Stress Concern Present (01/15/2021)   [place]    Feeling of Stress : Not at all  Social Connections: Unknown (01/15/2021)   Social Connection and Isolation Panel [NHANES]    Frequency of Communication with Friends and Family: More than three times a week    Frequency of Social Gatherings with Friends and Family: Not on file    Attends Religious Services: Not on file    Active Member of Clubs or Organizations: Not on file    Attends Archivist Meetings: Not on file    Marital Status: Not on file     Family History: The patient's family history includes Arthritis in her mother; Diabetes in her father; Heart disease in her mother.  ROS:   Please see the history of present illness.    All other systems reviewed and are negative.  EKGs/Labs/Other Studies Reviewed:    The following studies were reviewed today:   Recent Labs: 05/30/2022: B Natriuretic Peptide 137.6; Magnesium 1.8 06/02/2022: Hemoglobin 11.9; Platelets 183.0 07/15/2022: ALT 19; BUN 28; Creatinine, Ser 0.91; Potassium 4.6; Sodium 141; TSH 1.140  Recent Lipid Panel    Component Value Date/Time   CHOL 150 05/28/2022 0832   TRIG 86.0 05/28/2022 0832   HDL 54.00 05/28/2022 0832   CHOLHDL 3 05/28/2022 0832   VLDL 17.2 05/28/2022 0832   LDLCALC 79 05/28/2022 0832   LDLDIRECT 103.0 06/16/2017 0834    Physical Exam:    VS:  BP 118/68   Pulse 60   Ht '5\' 5"'$  (1.651  m)   Wt 132 lb (59.9 kg)   SpO2 95%   BMI 21.97 kg/m     Wt Readings from Last 3 Encounters:  08/26/22 132 lb (59.9 kg)  07/30/22 128 lb (58.1 kg)  07/15/22 131 lb (59.4 kg)     GEN:  Well nourished, well developed in no acute distress HEENT: Normal NECK: No JVD; No carotid bruits LYMPHATICS: No lymphadenopathy CARDIAC: RRR, no murmurs, rubs, gallops RESPIRATORY:  Clear to auscultation without rales, wheezing or rhonchi  ABDOMEN: Soft, non-tender, non-distended MUSCULOSKELETAL: 1+ bilateral lower extremity pitting edema to the knees; No deformity  SKIN: Warm and dry NEUROLOGIC:  Alert and oriented x 3 PSYCHIATRIC:  Normal affect        ASSESSMENT:    1. SVT (supraventricular tachycardia)   2. Encounter for long-term (current) use of high-risk medication   3. Primary hypertension    PLAN:    In order of problems listed above:   #SVT Doing well on amiodarone Recommend follow up in 6 months with APP with blood work at that visit (CMP, TSH and FT4).  #Hypertension At goal today.  Recommend checking blood pressures 1-2 times per week at home and recording the values.  Recommend bringing these recordings to the primary care physician.  #Lower extremity edema I will start Lasix 20 mg by mouth 2 times per week as needed for lower extremity edema.  She will follow-up with Dr. Derrel Nip in the next 1 to 2 weeks to reassess her lower extremity edema.  I will send a note to Dr. Derrel Nip to ask if she can get lab work at that visit.  If she has partial improvement, consider daily Lasix with careful monitoring of kidney function.  Follow-up with our clinic in 6 months or sooner as needed.  Medication Adjustments/Labs and Tests Ordered: Current medicines are reviewed at length with the patient today.  Concerns regarding medicines are outlined above.  No orders of the defined types were placed in this encounter.  Meds ordered this encounter  Medications   furosemide (LASIX) 20 MG  tablet    Sig: Take 1 tablet (20 mg total) by mouth as needed. Take 1-2 tablets per week as needed for swelling    Dispense:  45 tablet    Refill:  3     Signed, Lars Mage, MD, [facility], [facility] 08/26/2022 12:53 PM    Electrophysiology [facility]

## 2022-08-26 ENCOUNTER — Telehealth: Payer: Self-pay | Admitting: Cardiology

## 2022-08-26 ENCOUNTER — Ambulatory Visit: Payer: Medicare Other | Attending: Cardiology | Admitting: Cardiology

## 2022-08-26 VITALS — BP 118/68 | HR 60 | Ht 65.0 in | Wt 132.0 lb

## 2022-08-26 DIAGNOSIS — I10 Essential (primary) hypertension: Secondary | ICD-10-CM | POA: Insufficient documentation

## 2022-08-26 DIAGNOSIS — I471 Supraventricular tachycardia, unspecified: Secondary | ICD-10-CM | POA: Diagnosis present

## 2022-08-26 DIAGNOSIS — Z79899 Other long term (current) drug therapy: Secondary | ICD-10-CM | POA: Insufficient documentation

## 2022-08-26 MED ORDER — FUROSEMIDE 20 MG PO TABS: 20.0000 mg | ORAL_TABLET | ORAL | 3 refills | Status: DC | PRN

## 2022-08-26 NOTE — Telephone Encounter (Signed)
Pt c/o medication issue:  1. Name of Medication: furosemide (LASIX) 20 MG tablet   2. How are you currently taking this medication (dosage and times per day)?   3. Are you having a reaction (difficulty breathing--STAT)?   4. What is your medication issue?  Pharmacy is requesting call back to get clarification on this medications instructions. Please advise.

## 2022-08-26 NOTE — Telephone Encounter (Signed)
[facility] called to verify pt's Lasix prescription. Per chart review on 11/29 , MD recommended the following:   #Lower extremity edema I will start Lasix 20 mg by mouth 2 times per week as needed for lower extremity edema.  Pharmacist made aware and verbalized understanding.

## 2022-08-26 NOTE — Patient Instructions (Signed)
Medication Instructions:  Your physician has recommended you make the following change in your medication:  1) START taking Lasix (furosemide) 20 mg 1-2 times per week as needed for swelling  *If you need a refill on your cardiac medications before your next appointment, please call your pharmacy*  Follow-Up: At [facility], you and your health needs are our priority.  As part of our continuing mission to provide you with exceptional heart care, we have created designated Provider Care Teams.  These Care Teams include your primary Cardiologist (physician) and Advanced Practice Providers (APPs -  Physician Assistants and Nurse Practitioners) who all work together to provide you with the care you need, when you need it.  Follow up in 1-2 weeks with Dr. Derrel Nip   Your next appointment:   6 month(s)  The format for your next appointment:   In Person  Provider:   You may see Vickie Epley, MD or one of the following Advanced Practice Providers on your designated Care Team:   Tommye Standard, [place] Legrand Como "Jonni Sanger" Chalmers Cater, [place]    Important Information About Sugar

## 2022-08-29 ENCOUNTER — Emergency Department: Payer: Medicare Other

## 2022-08-29 ENCOUNTER — Observation Stay
Admission: EM | Admit: 2022-08-29 | Discharge: 2022-08-31 | Disposition: A | Discharge status: Home / Self Care | Payer: Medicare Other | Attending: Internal Medicine | Admitting: Internal Medicine

## 2022-08-29 ENCOUNTER — Other Ambulatory Visit: Payer: Self-pay

## 2022-08-29 DIAGNOSIS — I471 Supraventricular tachycardia, unspecified: Principal | ICD-10-CM

## 2022-08-29 DIAGNOSIS — E876 Hypokalemia: Secondary | ICD-10-CM

## 2022-08-29 DIAGNOSIS — R002 Palpitations: Secondary | ICD-10-CM | POA: Diagnosis present

## 2022-08-29 DIAGNOSIS — E785 Hyperlipidemia, unspecified: Secondary | ICD-10-CM

## 2022-08-29 DIAGNOSIS — A419 Sepsis, unspecified organism: Secondary | ICD-10-CM

## 2022-08-29 DIAGNOSIS — I4891 Unspecified atrial fibrillation: Secondary | ICD-10-CM

## 2022-08-29 DIAGNOSIS — K219 Gastro-esophageal reflux disease without esophagitis: Secondary | ICD-10-CM | POA: Diagnosis not present

## 2022-08-29 DIAGNOSIS — R600 Localized edema: Secondary | ICD-10-CM | POA: Diagnosis not present

## 2022-08-29 DIAGNOSIS — N390 Urinary tract infection, site not specified: Secondary | ICD-10-CM | POA: Diagnosis not present

## 2022-08-29 LAB — BASIC METABOLIC PANEL
Anion gap: 6 (ref 5–15)
BUN: 26 mg/dL — ABNORMAL HIGH (ref 8–23)
CO2: 23 mmol/L (ref 22–32)
Calcium: 8.2 mg/dL — ABNORMAL LOW (ref 8.9–10.3)
Chloride: 112 mmol/L — ABNORMAL HIGH (ref 98–111)
Creatinine, Ser: 0.95 mg/dL (ref 0.44–1.00)
GFR, Estimated: 58 mL/min — ABNORMAL LOW (ref >60)
Glucose, Bld: 99 mg/dL (ref 70–99)
Potassium: 3.1 mmol/L — ABNORMAL LOW (ref 3.5–5.1)
Sodium: 141 mmol/L (ref 135–145)

## 2022-08-29 LAB — CBC
HCT: 35.2 % — ABNORMAL LOW (ref 36.0–46.0)
Hemoglobin: 11.2 g/dL — ABNORMAL LOW (ref 12.0–15.0)
MCH: 31.4 pg (ref 26.0–34.0)
MCHC: 31.8 g/dL (ref 30.0–36.0)
MCV: 98.6 fL (ref 80.0–100.0)
Platelets: 186 10*3/uL (ref 150–400)
RBC: 3.57 MIL/uL — ABNORMAL LOW (ref 3.87–5.11)
RDW: 20.2 % — ABNORMAL HIGH (ref 11.5–15.5)
WBC: 12.8 10*3/uL — ABNORMAL HIGH (ref 4.0–10.5)
nRBC: 0.5 % — ABNORMAL HIGH (ref 0.0–0.2)

## 2022-08-29 LAB — TROPONIN I (HIGH SENSITIVITY): Troponin I (High Sensitivity): 11 ng/L (ref <18)

## 2022-08-29 LAB — MAGNESIUM: Magnesium: 1.8 mg/dL (ref 1.7–2.4)

## 2022-08-29 LAB — TSH: TSH: 0.964 u[IU]/mL (ref 0.350–4.500)

## 2022-08-29 MED ORDER — DILTIAZEM HCL-DEXTROSE 125-5 MG/125ML-% IV SOLN (PREMIX)
5.0000 mg/h | INTRAVENOUS | Status: DC
  Administered 2022-08-30: 5 mg/h via INTRAVENOUS
  Filled 2022-08-29: qty 125

## 2022-08-29 MED ORDER — DILTIAZEM LOAD VIA INFUSION
10.0000 mg | Freq: Once | INTRAVENOUS | Status: AC
  Administered 2022-08-30: 10 mg via INTRAVENOUS
  Filled 2022-08-29: qty 10

## 2022-08-29 MED ORDER — POTASSIUM CHLORIDE CRYS ER 20 MEQ PO TBCR
40.0000 meq | EXTENDED_RELEASE_TABLET | Freq: Once | ORAL | Status: AC
  Administered 2022-08-30: 40 meq via ORAL
  Filled 2022-08-29: qty 2

## 2022-08-29 NOTE — ED Provider Notes (Signed)
[facility] Provider Note    Event Date/Time   First MD Initiated Contact with Patient 08/29/22 2216     (approximate)   History   Tachycardia   HPI  Brenda Cardenas is a 88 y.o. female with past medical history significant for SVT on amiodarone and metoprolol, who presents to the emergency department with heart palpitations.  Heart palpitations that started earlier this morning.  States that she took 2 extra doses of her metoprolol as she was told to do by her cardiologist, continued to have elevated heart rate and palpitations.  When EMS arrived she had a heart rate in the 150s.  Patient also complaining of burning with urination and increased urinary urgency and frequency.  Recently started on Lasix twice a week by her cardiologist for worsening leg edema.  Denies history of DVT or PE.  No falls or trauma.  Cardiologist Dr. Saunders Revel.     Physical Exam   Triage Vital Signs: ED Triage Vitals  Enc Vitals Group     BP 08/29/22 2203 100/68     Pulse Rate 08/29/22 2203 (!) 133     Resp 08/29/22 2203 16     Temp 08/29/22 2203 98 F (36.7 C)     Temp Source 08/29/22 2203 Oral     SpO2 08/29/22 2203 99 %     Weight 08/29/22 2208 131 lb 13.4 oz (59.8 kg)     Height 08/29/22 2208 '5\' 5"'$  (1.651 m)     Head Circumference --      Peak Flow --      Pain Score 08/29/22 2207 0     Pain Loc --      Pain Edu? --      Excl. in [place]? --     Most recent vital signs: Vitals:   08/29/22 2330 08/30/22 0000  BP: (!) 123/94 105/76  Pulse: (!) 136 (!) 130  Resp: (!) 23 (!) 23  Temp:    SpO2:      Physical Exam Constitutional:      Appearance: She is well-developed.  HENT:     Head: Atraumatic.  Eyes:     Conjunctiva/sclera: Conjunctivae normal.  Cardiovascular:     Rate and Rhythm: Tachycardia present. Rhythm irregular.     Comments: Heart rate 140s Pulmonary:     Effort: No respiratory distress.  Abdominal:     General: There is no distension.   Musculoskeletal:        General: Normal range of motion.     Cervical back: Normal range of motion.     Right lower leg: Edema present.     Left lower leg: Edema present.     Comments: +3 pitting edema to bilateral lower extremities  Skin:    General: Skin is warm.     Comments: Skin tear to the right elbow  Neurological:     Mental Status: She is alert. Mental status is at baseline.          IMPRESSION / MDM / ASSESSMENT AND PLAN / ED COURSE  I reviewed the triage vital signs and the nursing notes.  Differential diagnosis including supraventricular tachycardia, atrial fibrillation/flutter, electrolyte abnormality, heart failure exacerbation, ACS, thyroid abnormality  On chart review patient followed by Dr. Quentin Ore with EP and with cardiology, has a history of SVT and is on amiodarone and metoprolol.  EKG  I, Nathaniel Man, the attending physician, personally viewed and interpreted this ECG.   Rate: 140s  Rhythm: Irregular  rhythm, no P waves  Axis: Normal  Intervals: Normal  ST&T Change: None  Atrial fibrillation with a rapid rate on cardiac telemetry  RADIOLOGY I independently reviewed imaging, my interpretation of imaging: Chest x-ray with no focal findings consistent with pneumonia.  No pulmonary edema or findings of heart failure.  Read as no acute findings      ED Results / Procedures / Treatments   Labs (all labs ordered are listed, but only abnormal results are displayed) Labs interpreted as -   Mildly low potassium level at 3.1.  Magnesium within normal limits.  Creatinine at her baseline. Labs Reviewed  BASIC METABOLIC PANEL - Abnormal; Notable for the following components:      Result Value   Potassium 3.1 (*)    Chloride 112 (*)    BUN 26 (*)    Calcium 8.2 (*)    GFR, Estimated 58 (*)    All other components within normal limits  CBC - Abnormal; Notable for the following components:   WBC 12.8 (*)    RBC 3.57 (*)    Hemoglobin 11.2 (*)     HCT 35.2 (*)    RDW 20.2 (*)    nRBC 0.5 (*)    All other components within normal limits  BRAIN NATRIURETIC PEPTIDE - Abnormal; Notable for the following components:   B Natriuretic Peptide 364.1 (*)    All other components within normal limits  MAGNESIUM  TSH  URINALYSIS, ROUTINE W REFLEX MICROSCOPIC  TROPONIN I (HIGH SENSITIVITY)  TROPONIN I (HIGH SENSITIVITY)    Patient appears volume overloaded with lower extremity edema but does not appear to be in heart failure.  Given p.o. potassium.  Given IV diltiazem and started on diltiazem infusion.  Patient admitted for supraventricular tachycardia with a rapid rate.  Consulted hospitalist for admission.   PROCEDURES:  Critical Care performed: Yes  .Critical Care  Performed by: Nathaniel Man, MD Authorized by: Nathaniel Man, MD   Critical care provider statement:    Critical care time (minutes):  45   Critical care time was exclusive of:  Separately billable procedures and treating other patients   Critical care was necessary to treat or prevent imminent or life-threatening deterioration of the following conditions:  Cardiac failure   Critical care was time spent personally by me on the following activities:  Development of treatment plan with patient or surrogate, discussions with consultants, evaluation of patient's response to treatment, examination of patient, ordering and review of laboratory studies, ordering and review of radiographic studies, ordering and performing treatments and interventions, pulse oximetry, re-evaluation of patient's condition and review of old charts   Patient's presentation is most consistent with acute presentation with potential threat to life or bodily function.   MEDICATIONS ORDERED IN ED: Medications  diltiazem (CARDIZEM) 1 mg/mL load via infusion 10 mg (10 mg Intravenous Bolus from Bag 08/30/22 0020)    And  diltiazem (CARDIZEM) 125 mg in dextrose 5% 125 mL (1 mg/mL) infusion (5 mg/hr  Intravenous New Bag/Given 08/30/22 0024)  potassium chloride SA (KLOR-CON M) CR tablet 40 mEq (40 mEq Oral Given 08/30/22 0025)    FINAL CLINICAL IMPRESSION(S) / ED DIAGNOSES   Final diagnoses:  SVT (supraventricular tachycardia)  Palpitations  Atrial fibrillation with RVR ([place])     Rx / DC Orders   ED Discharge Orders     None        Note:  This document was prepared using Dragon voice recognition software and may include unintentional  dictation errors.   Nathaniel Man, MD 08/30/22 Shelah Lewandowsky

## 2022-08-29 NOTE — ED Triage Notes (Signed)
BIB [facility] from home for runs of SVT. Pt is CAOx4 and in no acute distress.   18G RFA Vitals WNL for EMS.

## 2022-08-29 NOTE — ED Notes (Signed)
Patient taken to Radiology at this time.

## 2022-08-30 DIAGNOSIS — E876 Hypokalemia: Secondary | ICD-10-CM | POA: Diagnosis not present

## 2022-08-30 DIAGNOSIS — I471 Supraventricular tachycardia, unspecified: Secondary | ICD-10-CM | POA: Diagnosis not present

## 2022-08-30 DIAGNOSIS — A419 Sepsis, unspecified organism: Secondary | ICD-10-CM

## 2022-08-30 DIAGNOSIS — K219 Gastro-esophageal reflux disease without esophagitis: Secondary | ICD-10-CM | POA: Insufficient documentation

## 2022-08-30 DIAGNOSIS — N390 Urinary tract infection, site not specified: Secondary | ICD-10-CM

## 2022-08-30 DIAGNOSIS — E785 Hyperlipidemia, unspecified: Secondary | ICD-10-CM

## 2022-08-30 DIAGNOSIS — R600 Localized edema: Secondary | ICD-10-CM | POA: Insufficient documentation

## 2022-08-30 LAB — BASIC METABOLIC PANEL
Anion gap: 2 — ABNORMAL LOW (ref 5–15)
BUN: 24 mg/dL — ABNORMAL HIGH (ref 8–23)
CO2: 26 mmol/L (ref 22–32)
Calcium: 8.5 mg/dL — ABNORMAL LOW (ref 8.9–10.3)
Chloride: 112 mmol/L — ABNORMAL HIGH (ref 98–111)
Creatinine, Ser: 0.83 mg/dL (ref 0.44–1.00)
GFR, Estimated: >60 mL/min (ref >60)
Glucose, Bld: 95 mg/dL (ref 70–99)
Potassium: 3.9 mmol/L (ref 3.5–5.1)
Sodium: 140 mmol/L (ref 135–145)

## 2022-08-30 LAB — URINALYSIS, ROUTINE W REFLEX MICROSCOPIC
Bacteria, UA: NONE SEEN
Bilirubin Urine: NEGATIVE
Glucose, UA: NEGATIVE mg/dL
Hgb urine dipstick: NEGATIVE
Ketones, ur: 5 mg/dL — AB
Leukocytes,Ua: NEGATIVE
Nitrite: NEGATIVE
Protein, ur: 30 mg/dL — AB
Specific Gravity, Urine: 1.015 (ref 1.005–1.030)
pH: 5 (ref 5.0–8.0)

## 2022-08-30 LAB — CBC
HCT: 31.3 % — ABNORMAL LOW (ref 36.0–46.0)
Hemoglobin: 9.8 g/dL — ABNORMAL LOW (ref 12.0–15.0)
MCH: 31.7 pg (ref 26.0–34.0)
MCHC: 31.3 g/dL (ref 30.0–36.0)
MCV: 101.3 fL — ABNORMAL HIGH (ref 80.0–100.0)
Platelets: 159 10*3/uL (ref 150–400)
RBC: 3.09 MIL/uL — ABNORMAL LOW (ref 3.87–5.11)
RDW: 20.6 % — ABNORMAL HIGH (ref 11.5–15.5)
WBC: 10.3 10*3/uL (ref 4.0–10.5)
nRBC: 0.2 % (ref 0.0–0.2)

## 2022-08-30 LAB — PROTIME-INR
INR: 1.2 (ref 0.8–1.2)
Prothrombin Time: 14.8 seconds (ref 11.4–15.2)

## 2022-08-30 LAB — BRAIN NATRIURETIC PEPTIDE: B Natriuretic Peptide: 364.1 pg/mL — ABNORMAL HIGH (ref 0.0–100.0)

## 2022-08-30 LAB — TROPONIN I (HIGH SENSITIVITY): Troponin I (High Sensitivity): 18 ng/L — ABNORMAL HIGH (ref <18)

## 2022-08-30 LAB — LACTIC ACID, PLASMA: Lactic Acid, Venous: 0.8 mmol/L (ref 0.5–1.9)

## 2022-08-30 LAB — APTT: aPTT: 30 seconds (ref 24–36)

## 2022-08-30 MED ORDER — FUROSEMIDE 40 MG PO TABS: 20.0000 mg | ORAL_TABLET | ORAL | Status: DC | PRN

## 2022-08-30 MED ORDER — SODIUM CHLORIDE 0.9 % IV SOLN
2.0000 g | Freq: Every day | INTRAVENOUS | Status: DC
  Administered 2022-08-30: 2 g via INTRAVENOUS
  Filled 2022-08-30: qty 20

## 2022-08-30 MED ORDER — HYOSCYAMINE SULFATE 0.125 MG PO TBDP: 0.1250 mg | ORAL_TABLET | Freq: Four times a day (QID) | ORAL | Status: DC | PRN

## 2022-08-30 MED ORDER — ACETAMINOPHEN 325 MG PO TABS: 650.0000 mg | ORAL_TABLET | Freq: Four times a day (QID) | ORAL | Status: DC | PRN

## 2022-08-30 MED ORDER — ONDANSETRON HCL 4 MG PO TABS: 4.0000 mg | ORAL_TABLET | Freq: Four times a day (QID) | ORAL | Status: DC | PRN

## 2022-08-30 MED ORDER — CRANBERRY 250 MG PO TABS: ORAL_TABLET | Freq: Every day | ORAL | Status: DC

## 2022-08-30 MED ORDER — ACETAMINOPHEN 325 MG RE SUPP: 650.0000 mg | Freq: Four times a day (QID) | RECTAL | Status: DC | PRN

## 2022-08-30 MED ORDER — ENOXAPARIN SODIUM 40 MG/0.4ML IJ SOSY
40.0000 mg | PREFILLED_SYRINGE | INTRAMUSCULAR | Status: DC
  Administered 2022-08-31: 40 mg via SUBCUTANEOUS
  Filled 2022-08-30: qty 0.4

## 2022-08-30 MED ORDER — SODIUM CHLORIDE 0.9 % IV SOLN: INTRAVENOUS | Status: DC

## 2022-08-30 MED ORDER — EZETIMIBE 10 MG PO TABS
10.0000 mg | ORAL_TABLET | Freq: Every day | ORAL | Status: DC
  Administered 2022-08-30 – 2022-08-31 (×2): 10 mg via ORAL
  Filled 2022-08-30 (×2): qty 1

## 2022-08-30 MED ORDER — CLOBETASOL PROPIONATE 0.05 % EX OINT: 1.0000 | TOPICAL_OINTMENT | CUTANEOUS | Status: DC

## 2022-08-30 MED ORDER — RISAQUAD PO CAPS
1.0000 | ORAL_CAPSULE | Freq: Every day | ORAL | Status: DC
  Administered 2022-08-31: 1 via ORAL
  Filled 2022-08-30: qty 1

## 2022-08-30 MED ORDER — PANTOPRAZOLE SODIUM 40 MG PO TBEC
40.0000 mg | DELAYED_RELEASE_TABLET | Freq: Every day | ORAL | Status: DC
  Administered 2022-08-31: 40 mg via ORAL
  Filled 2022-08-30: qty 1

## 2022-08-30 MED ORDER — MAGNESIUM HYDROXIDE 400 MG/5ML PO SUSP: 30.0000 mL | Freq: Every day | ORAL | Status: DC | PRN

## 2022-08-30 MED ORDER — AMIODARONE HCL 200 MG PO TABS
200.0000 mg | ORAL_TABLET | Freq: Every day | ORAL | Status: DC
  Administered 2022-08-30 – 2022-08-31 (×2): 200 mg via ORAL
  Filled 2022-08-30 (×2): qty 1

## 2022-08-30 MED ORDER — TRAZODONE HCL 50 MG PO TABS: 25.0000 mg | ORAL_TABLET | Freq: Every evening | ORAL | Status: DC | PRN

## 2022-08-30 MED ORDER — VITAMIN D 25 MCG (1000 UNIT) PO TABS: 2000.0000 [IU] | ORAL_TABLET | ORAL | Status: DC

## 2022-08-30 MED ORDER — METOPROLOL SUCCINATE ER 50 MG PO TB24
25.0000 mg | ORAL_TABLET | Freq: Every day | ORAL | Status: DC
  Administered 2022-08-30 – 2022-08-31 (×2): 25 mg via ORAL
  Filled 2022-08-30 (×2): qty 1

## 2022-08-30 MED ORDER — ONDANSETRON HCL 4 MG/2ML IJ SOLN: 4.0000 mg | Freq: Four times a day (QID) | INTRAMUSCULAR | Status: DC | PRN

## 2022-08-30 NOTE — Progress Notes (Signed)
Briefly, patient is an 88 year old female with known history of atrial fibrillation versus SVT followed by Dr. Quentin Ore for EP and Dr. Saunders Revel for cardiology who was admitted early this morning for rapid heart rate.  She believes that the rapid heart rate started when she started taking Lasix for her lower extremity edema as was given to her by Dr. Quentin Ore last week.  In the ED patient was noted to be in A-fib with RVR with nonspecific IVCD.  Patient was treated with Cardizem with decrease in her heart rate to the 60s and then conversion to sinus rhythm.  When I saw the patient, she was very concerned about seeing Dr. Quentin Ore or Dr. Oval Linsey who she was told would be seeing her today as she is covering for Dr. Quentin Ore.  Patient states she knows Dr. Oval Linsey and had a good experience with her at her last admission.  Patient also is concerned about her lower extremity edema.  She states that Dr. Quentin Ore started her on Lasix to take 1 tablet every other day, she states she only took 1 tablet and then had rapid heart rate so she does not want to take it anymore.  Physical exam is notable for a relatively well-appearing female who is spry lying in bed in NAD.  Rate is in the mid 78s.  Lungs are clear.  Atrial arrhythmia/SVT versus atrial fibrillation Presently in NSR Continue amiodarone and Toprol-XL per home doses Discontinue Lasix Can consider cardiology consultation by Dr. Quentin Ore in a.m. if warranted. Patient states she is very concerned that her heart rate goes high and then low and does not know how to control it better.  Abnormal UA Patient was apparently found to have an abnormal UA however to my read it looks like she has multiple squamous epithelial cells and no bacteria or leukocytosis or nitrites. Will discontinue ceftriaxone  Lower extremity edema I suspect patient has venous stasis disease and would benefit from compression hose rather than initiation of Lasix Discussed with patient who  states she is unable to put on her compression stockings.  Notes she has no family and her best friends are her age.  She feels better asking the nurse at her assisted living facility because there is only 1 nurse for the whole facility.  Does not know how to get the compression stockings on. Lasix discontinued.

## 2022-08-30 NOTE — Assessment & Plan Note (Addendum)
-   The patient will be admitted to a cardiac telemetry bed. - We will continue monitoring for recurrent SVT. - We will optimize electrolytes. - We will continue hydration with IV normal saline with added potassium chloride. - We will continue amiodarone. - With stabilization of blood pressure Toprol-XL can be continued.

## 2022-08-30 NOTE — Assessment & Plan Note (Signed)
-   We will continue PPI therapy 

## 2022-08-30 NOTE — Progress Notes (Signed)
PHARMACIST - PHYSICIAN ORDER COMMUNICATION  CONCERNING: P&T Medication Policy on Herbal Medications  DESCRIPTION:  This patient's order(s) for: Cranberry Tabs has been noted.  This product(s) is classified as an "herbal" or natural product. Due to a lack of definitive safety studies or FDA approval, nonstandard manufacturing practices, plus the potential risk of unknown drug-drug interactions while on inpatient medications, the Pharmacy and Therapeutics Committee does not permit the use of "herbal" or natural products of this type within [facility].   ACTION TAKEN: The pharmacy department is unable to verify this order at this time.  Please reevaluate patient's clinical condition at discharge and address if the herbal or natural product(s) should be resumed at that time.  Renda Rolls, PharmD, [facility] 08/30/2022 1:29 AM

## 2022-08-30 NOTE — Assessment & Plan Note (Signed)
-   We will resume Zetia.

## 2022-08-30 NOTE — Assessment & Plan Note (Addendum)
-   We will obtain urine culture and sensitivity and obtain blood cultures. - This likely mild sepsis based on tachycardia and tachypnea though tachycardia could be related to his SVT. - We will place on IV Rocephin given her urinary symptoms with associated pyuria.

## 2022-08-30 NOTE — Assessment & Plan Note (Signed)
-   We will aggressively replace potassium as it it is likely contributing to her SVT.

## 2022-08-30 NOTE — Assessment & Plan Note (Signed)
-   She has no signs of CHF.  We will hold Lasix for now.

## 2022-08-30 NOTE — H&P (Signed)
[employer]   PATIENT NAME: Brenda Cardenas    MR#:  [id number]  DATE OF BIRTH:  1932-04-28  DATE OF ADMISSION:  08/29/2022  PRIMARY CARE PHYSICIAN: Crecencio Mc, MD   Patient is coming from: Home  REQUESTING/REFERRING PHYSICIAN: Nathaniel Man, MD  CHIEF COMPLAINT:   Chief Complaint  Patient presents with   Tachycardia    HISTORY OF PRESENT ILLNESS:  Brenda Cardenas is a 88 y.o. Caucasian female with medical history significant for GERD, hiatal hernia, hypertension, dyslipidemia, IBS, and arthritis, who presented to emergency room with acute onset of palpitations and a feeling of racing heart that started earlier this morning that she feels up to her neck.  She took 2 extra doses of her metoprolol which she was told to take by the cardiologist but continued to have elevated heart rate and a feeling of palpitations.  When she came to the ER her heart rate was in the 150s.  She has been on amiodarone and Lopressor for her SVT.  She was recently given Lasix for lower extremity edema.  She felt her symptoms started after Lasix.  She admits to having significant urinary frequency and going to the bathroom very frequently that she almost fell and caught herself before hitting the wall and sustained a right elbow tear.  She denies any nausea or vomiting or abdominal pain.  No chest pain or palpitations. When the patient came to the ER potassium was 3.1 with the BUN of 26 and BNP 364.1.  High-sensitivity troponin I was 11 and later 18.  CBC showed leukocytosis 12.8 with anemia.  UA showed 30 protein and 6-10 WBCs with no bacteria.  She admits to urinary frequency and dysuria, oliguria or hematuria or flank pain. She admits to urinary frequency and dysuria.  ED Course: When the patient came to the ER potassium was 3.1 with the BUN of 26 and BNP 364.1.  High-sensitivity troponin I was 11 and later 18.  CBC showed leukocytosis 12.8 with anemia.  UA showed 30 protein and 6-10 WBCs with no  bacteria.   EKG as reviewed by me : EKG showed atrial fibrillation with ventricular sponsor 132 with nonspecific IVCD and LAD as well as LVH Imaging: Two-view chest x-ray showed no acute cardiopulmonary disease.  The patient was given 10 mg of IV Cardizem bolus followed by drip and 40 mill equivalent p.o. potassium chloride.  Heart rate was down to 65 then 61 with a BP of 93/62 and her IV Cardizem drip was held off.  She was in normal sinus rhythm.  She will be admitted to a medical telemetry bed for further evaluation and management. PAST MEDICAL HISTORY:   Past Medical History:  Diagnosis Date   Arthritis    Basal cell carcinoma 06/04/2010   left upper back   Chronic kidney disease    Colon polyps    Gastritis    GERD (gastroesophageal reflux disease)    Hiatal hernia    Hyperlipidemia    Hypertension    IBS (irritable bowel syndrome)    Low back pain radiating to left leg 03/28/2020   Sciatica, left side 04/15/2020   Squamous cell carcinoma of skin 06/26/2013   left mid pretibial   Squamous cell carcinoma of skin 08/21/2015   lower sternum   Squamous cell carcinoma of skin 10/14/2017   right mid pretibial   Squamous cell carcinoma of skin 03/17/2018   right prox lateral elbow   Squamous cell carcinoma of skin  09/07/2018   right post thigh   Squamous cell carcinoma of skin 05/11/2019   right mid med pretibial   Squamous cell carcinoma of skin 06/20/2019   right bicep   Supraventricular tachycardia     PAST SURGICAL HISTORY:   Past Surgical History:  Procedure Laterality Date   APPENDECTOMY  1959   CARPAL TUNNEL RELEASE     CATARACT EXTRACTION W/ INTRAOCULAR LENS IMPLANT Left    CATARACT EXTRACTION W/PHACO Right 04/20/2018   Procedure: CATARACT EXTRACTION PHACO AND INTRAOCULAR LENS PLACEMENT ([place])  RIGHT;  Surgeon: Leandrew Koyanagi, MD;  Location: [facility];  Service: Ophthalmology;  Laterality: Right;   CHOLECYSTECTOMY  1995   COLONOSCOPY WITH PROPOFOL  N/A 07/30/2022   Procedure: COLONOSCOPY WITH PROPOFOL;  Surgeon: Lin Landsman, MD;  Location: [facility]COPY;  Service: Gastroenterology;  Laterality: N/A;   EYE SURGERY     GANGLION CYST EXCISION     JOINT REPLACEMENT Right 03/2008   [facility]    KNEE ARTHROSCOPY     LITHOTRIPSY     SALIVARY GLAND SURGERY     SKIN CANCER EXCISION     TONSILLECTOMY     TONSILLECTOMY AND ADENOIDECTOMY     VEIN LIGATION      SOCIAL HISTORY:   Social History   Tobacco Use   Smoking status: Never   Smokeless tobacco: Never  Substance Use Topics   Alcohol use: No    Alcohol/week: 0.0 standard drinks of alcohol    FAMILY HISTORY:   Family History  Problem Relation Age of Onset   Heart disease Mother    Arthritis Mother    Diabetes Father     DRUG ALLERGIES:   Allergies  Allergen Reactions   Ciprofloxacin Diarrhea   Clindamycin/Lincomycin Diarrhea   Sulfa Antibiotics Rash    Rash in throat   Augmentin [Amoxicillin-Pot Clavulanate] Diarrhea   Gabapentin Other (See Comments)    Irregular heartbeat   Lidocaine     Other reaction(s): Not available   Naproxen Sodium Other (See Comments)    Causes gastritis   Prednisone     Atrial Fibrillation   Tramadol Diarrhea    "upset stomach, headache, dizziness, drowsiness"   Codeine Nausea Only and Rash    Upset stomach   Doxycycline Hyclate Nausea And Vomiting and Nausea Only    Dry heaves.    E-Mycin [Erythromycin] Rash   Macrobid [Nitrofurantoin] Rash    severe   Nabumetone Rash and Other (See Comments)    Upsets liver enzymes   Nitrofurantoin Monohyd Macro Rash   Nsaids Rash    Gastritis, GI ulceration.    Phenobarbital Other (See Comments), Rash and Anxiety    "got wild" Patient becomes very hyper and anxious   Statins Rash    Upsets liver enzymes    REVIEW OF SYSTEMS:   ROS As per history of present illness. All pertinent systems were reviewed above. Constitutional, HEENT, cardiovascular, respiratory, GI, GU,  musculoskeletal, neuro, psychiatric, endocrine, integumentary and hematologic systems were reviewed and are otherwise negative/unremarkable except for positive findings mentioned above in the HPI.   MEDICATIONS AT HOME:   Prior to Admission medications   Medication Sig Start Date End Date Taking? Authorizing Provider  amiodarone (PACERONE) 200 MG tablet Take 1 tablet (200 mg total) by mouth daily. 06/07/22  Yes Jennye Boroughs, MD  Calcium Citrate-Vitamin D (CITRACAL + D PO) Take 2 capsules by mouth every other day. Alternates with Vitamin D3   Yes [provider]  Cholecalciferol (VITAMIN  D) 50 MCG (2000 UT) CAPS Take 1 capsule by mouth every other day.   Yes [provider]  clobetasol ointment (TEMOVATE) 1.61 % Apply 1 Application topically 2 (two) times daily. 05/13/22  Yes Crecencio Mc, MD  CRANBERRY PO Take 2 capsules by mouth daily.    Yes [provider]  estradiol (ESTRACE) 0.1 MG/GM vaginal cream 1 Applicatorful every other day.   Yes [provider]  ezetimibe (ZETIA) 10 MG tablet Take 1 tablet (10 mg total) by mouth daily. 01/13/22  Yes Crecencio Mc, MD  metoprolol succinate (TOPROL XL) 25 MG 24 hr tablet Take 1 tablet (25 mg total) by mouth daily. 05/20/22  Yes Vickie Epley, MD  omeprazole (PRILOSEC) 20 MG capsule TAKE 1 CAPSULE BY MOUTH EVERY DAY 08/24/22  Yes Crecencio Mc, MD  Probiotic Product (ALIGN PO) Take 1 tablet by mouth daily.    Yes [provider]  acetaminophen (TYLENOL) 325 MG tablet Take 650 mg by mouth every 6 (six) hours as needed.    [provider]  furosemide (LASIX) 20 MG tablet Take 1 tablet (20 mg total) by mouth as needed. Take 1-2 tablets per week as needed for swelling 08/26/22   Vickie Epley, MD  hyoscyamine (ANASPAZ) 0.125 MG TBDP disintergrating tablet Place 1 tablet (0.125 mg total) under the tongue every 6 (six) hours as needed (as needed for esophageal spasm). 01/11/20   Crecencio Mc, MD  ondansetron (ZOFRAN ODT) 4 MG disintegrating tablet Take 1 tablet (4 mg total) by mouth every 8 (eight) hours as needed for nausea or vomiting. Patient not taking: Reported on 08/29/2022 07/04/21   Crecencio Mc, MD      VITAL SIGNS:  Blood pressure 104/61, pulse 60, temperature 97.8 F (36.6 C), temperature source Oral, resp. rate 15, height '5\' 5"'$  (1.651 m), weight 59.8 kg, SpO2 99 %.  PHYSICAL EXAMINATION:  Physical Exam  GENERAL:  88 y.o.-year-old patient lying in the bed with no acute distress.  EYES: Pupils equal, round, reactive to light and accommodation. No scleral icterus. Extraocular muscles intact.  HEENT: Head atraumatic, normocephalic. Oropharynx and nasopharynx clear.  NECK:  Supple, no jugular venous distention. No thyroid enlargement, no tenderness.  LUNGS: Normal breath sounds bilaterally, no wheezing, rales,rhonchi or crepitation. No use of accessory muscles of respiration.  CARDIOVASCULAR: Regular rate and rhythm, S1, S2 normal. No murmurs, rubs, or gallops.  ABDOMEN: Soft, nondistended, nontender. Bowel sounds present. No organomegaly or mass.  EXTREMITIES: No pedal edema, cyanosis, or clubbing.  NEUROLOGIC: Cranial nerves II through XII are intact. Muscle strength 5/5 in all extremities. Sensation intact. Gait not checked.  PSYCHIATRIC: The patient is alert and oriented x 3.  Normal affect and good eye contact. SKIN: No obvious rash, lesion, or ulcer.   LABORATORY PANEL:   CBC Recent Labs  Lab 08/29/22 2209  WBC 12.8*  HGB 11.2*  HCT 35.2*  PLT 186   ------------------------------------------------------------------------------------------------------------------  Chemistries  Recent Labs  Lab 08/29/22 2209  NA 141  K 3.1*  CL 112*  CO2 23  GLUCOSE 99  BUN 26*  CREATININE 0.95  CALCIUM 8.2*  MG 1.8   ------------------------------------------------------------------------------------------------------------------  Cardiac Enzymes No  results for input(s): "TROPONINI" in the last 168 hours. ------------------------------------------------------------------------------------------------------------------  RADIOLOGY:  DG Chest 2 View  Result Date: 08/29/2022 CLINICAL DATA:  Chest pain, tachycardia EXAM: CHEST - 2 VIEW COMPARISON:  05/31/2022 FINDINGS: Moderate-sized hiatal hernia. Heart is normal size. No confluent airspace opacities or  effusions. No acute bony abnormality. IMPRESSION: No active cardiopulmonary disease. Moderate-sized hiatal hernia. Electronically Signed   By: Rolm Baptise M.D.   On: 08/29/2022 22:46      IMPRESSION AND PLAN:  Assessment and Plan: * SVT (supraventricular tachycardia) - The patient will be admitted to a cardiac telemetry bed. - We will continue monitoring for recurrent SVT. - We will optimize electrolytes. - We will continue hydration with IV normal saline with added potassium chloride. - We will continue amiodarone. - With stabilization of blood pressure Toprol-XL can be continued.  Hypokalemia - We will aggressively replace potassium as it it is likely contributing to her SVT.  Dyslipidemia - We will resume Zetia.  Sepsis secondary to UTI ([place]) - We will obtain urine culture and sensitivity and obtain blood cultures. - This likely mild sepsis based on tachycardia and tachypnea though tachycardia could be related to his SVT. - We will place on IV Rocephin given her urinary symptoms with associated pyuria.  Lower extremity edema - She has no signs of CHF.  We will hold Lasix for now.  GERD without esophagitis - We will continue PPI therapy   DVT prophylaxis: Lovenox.  Advanced Care Planning:  Code Status: full code.  Family Communication:  The plan of care was discussed in details with the patient (and family). I answered all questions. The patient agreed to proceed with the above mentioned plan. Further management will depend upon hospital course. Disposition Plan: Back to  previous home environment Consults called: none. All the records are reviewed and case discussed with ED provider.  Status is: Inpatient   At the time of the admission, it appears that the appropriate admission status for this patient is inpatient.  This is judged to be reasonable and necessary in order to provide the required intensity of service to ensure the patient's safety given the presenting symptoms, physical exam findings and initial radiographic and laboratory data in the context of comorbid conditions.  The patient requires inpatient status due to high intensity of service, high risk of further deterioration and high frequency of surveillance required.  I certify that at the time of admission, it is my clinical judgment that the patient will require inpatient hospital care extending more than 2 midnights.                            Dispo: The patient is from: Home              Anticipated d/c is to: Home              Patient currently is not medically stable to d/c.              Difficult to place patient: No  Christel Mormon M.D on 08/30/2022 at 3:09 AM  [facility]   From 7 PM-7 AM, contact night-coverage [email or web address]  CC: Primary care physician; Crecencio Mc, MD

## 2022-08-30 NOTE — ED Notes (Signed)
1st set of BC obtained from left wrist, 2nd set from right hand

## 2022-08-31 ENCOUNTER — Ambulatory Visit: Payer: Medicare Other | Admitting: Internal Medicine

## 2022-08-31 DIAGNOSIS — I471 Supraventricular tachycardia, unspecified: Secondary | ICD-10-CM | POA: Diagnosis not present

## 2022-08-31 LAB — URINE CULTURE: Culture: NO GROWTH

## 2022-08-31 LAB — CBC
HCT: 32.2 % — ABNORMAL LOW (ref 36.0–46.0)
Hemoglobin: 10.3 g/dL — ABNORMAL LOW (ref 12.0–15.0)
MCH: 31.8 pg (ref 26.0–34.0)
MCHC: 32 g/dL (ref 30.0–36.0)
MCV: 99.4 fL (ref 80.0–100.0)
Platelets: 155 10*3/uL (ref 150–400)
RBC: 3.24 MIL/uL — ABNORMAL LOW (ref 3.87–5.11)
RDW: 20.6 % — ABNORMAL HIGH (ref 11.5–15.5)
WBC: 11 10*3/uL — ABNORMAL HIGH (ref 4.0–10.5)
nRBC: 0.2 % (ref 0.0–0.2)

## 2022-08-31 LAB — BASIC METABOLIC PANEL
Anion gap: 3 — ABNORMAL LOW (ref 5–15)
BUN: 21 mg/dL (ref 8–23)
CO2: 24 mmol/L (ref 22–32)
Calcium: 8.2 mg/dL — ABNORMAL LOW (ref 8.9–10.3)
Chloride: 111 mmol/L (ref 98–111)
Creatinine, Ser: 0.81 mg/dL (ref 0.44–1.00)
GFR, Estimated: >60 mL/min (ref >60)
Glucose, Bld: 89 mg/dL (ref 70–99)
Potassium: 4 mmol/L (ref 3.5–5.1)
Sodium: 138 mmol/L (ref 135–145)

## 2022-08-31 NOTE — Discharge Summary (Signed)
Physician Discharge Summary   Patient: Brenda Cardenas MRN: [id number] DOB: 12-13-1931  Admit date:     08/29/2022  Discharge date: 08/31/22  Discharge Physician: Fritzi Mandes   PCP: Crecencio Mc, MD   Recommendations at discharge:    F/u Dr Quentin Ore in 1 week F/u Dr Derrel Nip in 1-2 weeks  Discharge Diagnoses: Principal Problem:   SVT (supraventricular tachycardia) Active Problems:   Hypokalemia   Dyslipidemia   Sepsis secondary to UTI [facility])   GERD without esophagitis   Lower extremity edema   Hospital Course: Brenda Cardenas is a 88 y.o. Caucasian female with medical history significant for GERD, hiatal hernia, hypertension, dyslipidemia, IBS, and arthritis, who presented to emergency room with acute onset of palpitations and a feeling of racing heart that started earlier this morning that she feels up to her neck.  She took 2 extra doses of her metoprolol which she was told to take by the cardiologist but continued to have elevated heart rate and a feeling of palpitations.  When she came to the ER her heart rate was in the 150s.  She has been on amiodarone and Lopressor for her SVT.   SVT (supraventricular tachycardia) - continue amiodarone.and Toprol-XL  --pt revieced IV Dilitiazem  --pt to f/u Dr Quentin Ore in 1 week --she is to continue as needed BB per Dr Mardene Speak instructions --pt is SR HR 60's ,BP stable --d/w DR arida--ok to discharge with current meds   Hypokalemia - K 4.0   Dyslipidemia - on Zetia.   Sepsis secondary to UTI [facility]) --sepsis ruled out --UC no growth --No indication for Abxs  Lower extremity edema - She has no signs of CHF.   --cont compression TEDS --pt does not like haw lasix makes her feel--hold   GERD without esophagitis - continue PPI therapy     Disposition:  [place] Diet recommendation:  Discharge Diet Orders (From admission, onward)     Start     Ordered   08/31/22 0000  Diet - low sodium heart healthy        08/31/22 1214            Cardiac diet DISCHARGE MEDICATION: Allergies as of 08/31/2022       Reactions   Ciprofloxacin Diarrhea   Clindamycin/lincomycin Diarrhea   Sulfa Antibiotics Rash   Rash in throat   Augmentin [amoxicillin-pot Clavulanate] Diarrhea   Gabapentin Other (See Comments)   Irregular heartbeat   Lidocaine    Other reaction(s): Not available   Naproxen Sodium Other (See Comments)   Causes gastritis   Prednisone    Atrial Fibrillation   Tramadol Diarrhea   "upset stomach, headache, dizziness, drowsiness"   Codeine Nausea Only, Rash   Upset stomach   Doxycycline Hyclate Nausea And Vomiting, Nausea Only   Dry heaves.   E-mycin [erythromycin] Rash   Macrobid [nitrofurantoin] Rash   severe   Nabumetone Rash, Other (See Comments)   Upsets liver enzymes   Nitrofurantoin Monohyd Macro Rash   Nsaids Rash   Gastritis, GI ulceration.   Phenobarbital Other (See Comments), Rash, Anxiety   "got wild" Patient becomes very hyper and anxious   Statins Rash   Upsets liver enzymes        Medication List     STOP taking these medications    ondansetron 4 MG disintegrating tablet Commonly known as: Zofran ODT       TAKE these medications    acetaminophen 325 MG tablet Commonly known as: TYLENOL Take 650  mg by mouth every 6 (six) hours as needed.   ALIGN PO Take 1 tablet by mouth daily.   amiodarone 200 MG tablet Commonly known as: PACERONE Take 1 tablet (200 mg total) by mouth daily.   CITRACAL + D PO Take 2 capsules by mouth every other day. Alternates with Vitamin D3   clobetasol ointment 0.05 % Commonly known as: TEMOVATE Apply 1 Application topically 2 (two) times daily.   CRANBERRY PO Take 2 capsules by mouth daily.   estradiol 0.1 MG/GM vaginal cream Commonly known as: ESTRACE 1 Applicatorful every other day.   ezetimibe 10 MG tablet Commonly known as: ZETIA Take 1 tablet (10 mg total) by mouth daily.   furosemide 20 MG tablet Commonly known as:  LASIX Take 1 tablet (20 mg total) by mouth as needed. Take 1-2 tablets per week as needed for swelling   hyoscyamine 0.125 MG Tbdp disintergrating tablet Commonly known as: ANASPAZ Place 1 tablet (0.125 mg total) under the tongue every 6 (six) hours as needed (as needed for esophageal spasm).   metoprolol succinate 25 MG 24 hr tablet Commonly known as: Toprol XL Take 1 tablet (25 mg total) by mouth daily.   omeprazole 20 MG capsule Commonly known as: PRILOSEC TAKE 1 CAPSULE BY MOUTH EVERY DAY   Vitamin D 50 MCG (2000 UT) Caps Take 1 capsule by mouth every other day.        Follow-up Information     Crecencio Mc, MD. Schedule an appointment as soon as possible for a visit in 1 week(s).   Specialty: Internal Medicine Why: hospital f/u Contact information: [place] [address] [phone]         Vickie Epley, MD. Schedule an appointment as soon as possible for a visit in 1 week(s).   Specialties: Cardiology, Radiology Why: hospita f/u SVT Contact information: [place] [address] [phone]                Discharge Exam: Danley Danker Weights   08/29/22 2208  Weight: 59.8 kg   HR 60-66 SR  Condition at discharge: fair  The results of significant diagnostics from this hospitalization (including imaging, microbiology, ancillary and laboratory) are listed below for reference.   Imaging Studies: DG Chest 2 View  Result Date: 08/29/2022 CLINICAL DATA:  Chest pain, tachycardia EXAM: CHEST - 2 VIEW COMPARISON:  05/31/2022 FINDINGS: Moderate-sized hiatal hernia. Heart is normal size. No confluent airspace opacities or effusions. No acute bony abnormality. IMPRESSION: No active cardiopulmonary disease. Moderate-sized hiatal hernia. Electronically Signed   By: Rolm Baptise M.D.   On: 08/29/2022 22:46    Microbiology: Results for orders placed or performed during the hospital encounter of 08/29/22   Urine Culture     Status: None   Collection Time: 08/30/22  1:20 AM   Specimen: Urine, Clean Catch  Result Value Ref Range Status   Specimen Description   Final    URINE, CLEAN CATCH Performed at [facility], [address], [place], [place] [address]    Special Requests   Final    NONE Performed at [facility], [address] [place], [place] [address]    Culture   Final    NO GROWTH Performed at [facility], [place] [address] [place], [place] [address]    Report Status 08/31/2022 FINAL  Final  Culture, blood (x 2)     Status: None (Preliminary result)  Collection Time: 08/30/22  5:02 AM   Specimen: BLOOD  Result Value Ref Range Status   Specimen Description BLOOD BLOOD RIGHT HAND  Final   Special Requests   Final    BOTTLES DRAWN AEROBIC AND ANAEROBIC Blood Culture adequate volume   Culture   Final    NO GROWTH < 24 HOURS Performed at [facility], [address] [place], [place] [address]    Report Status PENDING  Incomplete  Culture, blood (x 2)     Status: None (Preliminary result)   Collection Time: 08/30/22  5:03 AM   Specimen: BLOOD  Result Value Ref Range Status   Specimen Description BLOOD BLOOD LEFT WRIST  Final   Special Requests   Final    BOTTLES DRAWN AEROBIC AND ANAEROBIC Blood Culture results may not be optimal due to an excessive volume of blood received in culture bottles   Culture   Final    NO GROWTH < 24 HOURS Performed at [facility], [address] [place],  [address]    Report Status PENDING  Incomplete    Labs: CBC: Recent Labs  Lab 08/29/22 2209 08/30/22 0503 08/31/22 0452  WBC 12.8* 10.3 11.0*  HGB 11.2* 9.8* 10.3*  HCT 35.2* 31.3* 32.2*  MCV 98.6 101.3* 99.4  PLT 186 159 697   Basic Metabolic Panel: Recent Labs  Lab 08/29/22 2209 08/30/22 0503 08/31/22 0452  NA 141 140 138  K 3.1* 3.9 4.0  CL 112* 112* 111  CO2 '23 26 24  '$ GLUCOSE 99 95 89  BUN 26* 24* 21   CREATININE 0.95 0.83 0.81  CALCIUM 8.2* 8.5* 8.2*  MG 1.8  --   --    Liver Function Tests: No results for input(s): "AST", "ALT", "ALKPHOS", "BILITOT", "PROT", "ALBUMIN" in the last 168 hours. CBG: No results for input(s): "GLUCAP" in the last 168 hours.  Discharge time spent: greater than 30 minutes.  Signed: Fritzi Mandes, MD [facility] 08/31/2022

## 2022-08-31 NOTE — Consult Note (Signed)
Cardiology Consultation   Patient ID: Brenda Cardenas MRN: [id number]; DOB: 03-02-1932  Admit date: 08/29/2022 Date of Consult: 08/31/2022  PCP:  Crecencio Mc, MD   [facility] Cardiologist:  Nelva Bush, MD  Electrophysiologist:  Vickie Epley, MD       Patient Profile:   Brenda Cardenas is a 88 y.o. female with a hx of significant for SVT, hypertension, hyperlipidemia, GERD, IBS, arthritis, and chronic low back  pain, who is being seen 08/31/2022 for the evaluation of palpitations and SVT at the request of Dr. Posey Pronto.  History of Present Illness:   [name] is an 88 year old female with a history of SVT, hypertension, hyperlipidemia, gastroesophageal reflux disease, IBS, arthritis,, and chronic low back pain.  She continues to follow with Dr. Saunders Revel and Dr. Quentin Ore.    Echocardiogram in 08/20/2020 demonstrated an EF of 60 to 65%, no regional wall motion abnormalities, G2 DD, trivial mitral regurgitation, and mild to moderate aortic valve sclerosis without evidence of stenosis.She was evaluated in 07/2021 for possible atrial fibrillation.  However EKG was most consistent with SVT.  Subsequent outpatient cardiac monitor showed multiple episodes of paroxysmal SVT lasting up to 5 minutes.  She was evaluated by Dr. Quentin Ore who transitioned her from atenolol to metoprolol.  It was recommended she continue to have symptomatic SVT amiodarone would be the next best option.  She was seen in the office on 12/2021 and noted occasional palpitations though these had improved with metoprolol use.  These episodes typically abated with vagal maneuvers.She was evaluated in the emergency department 03/2022 after being sent to the emergency room by her PCP with SVT.  Initial EKG showed SVT 165 bpm, nonspecific ST-T wave changes.  She was given 6 mg of adenosine and converted to sinus rhythm.  Repeat EKG showed sinus rhythm with a rate of 87 bpm and no acute ST-T wave changes.   High-sensitivity troponin of 110 with a delta troponin of 215.  Case was discussed with Agbor-Etang who felt the troponin was demand ischemia etiology and recommended the patient follow-up outpatient.  She was last seen in clinic by Dr. Quentin Ore on 08/26/22.  At that time she remained on amiodarone and was doing fairly well.  She had only had 1 episode of breakthrough SVT which she took an extra metoprolol that resolved the symptoms after approximately an hour.  She had developed some lower extremity edema and was subsequently placed on furosemide.  She was then encouraged to followed up with her primary care provider in 1 to 2 weeks to reassess the lower extremity edema.  Last echocardiogram was completed 05/13/2022 which revealed LVEF of 55-60%, no regional wall motion abnormalities, G2 DD, mild mitral valve regurgitation and aortic valve sclerosis without stenosis.  She presents to the [facility] emergency department on 12/2/2023with complaints of palpitations.  When she arrived to the emergency department her heart rate was noted to be 150 bpm.  She had been on amiodarone and Lopressor for SVT and was recently given furosemide for lower extremity edema.  She felt that her symptoms started after she had been taking furosemide.  She admits to having significant urinary frequency and going to the bathroom she almost fell and caught himself before hitting the wall causing a skin tear to the right arm.  She denies any chest pain, shortness of breath, or recurrent palpitations.  For SVT she was given 10 mg of IV diltiazem and her heart rate came down to 65.  She denies  chest pain, shortness of breath, or palpitations on exam this morning.  She does endorse edema to her bilateral lower extremities that has improved.  Initial vitals: Blood pressure 100/68, pulse of 133, respirations of 16, temperature of 98  Pertinent labs: Potassium 3.1, chloride 112, BUN 26, calcium of 8.2, GFR 58, WBC 12.8, hemoglobin 11.2,  hematocrit of 35.2, BNP of 364.1, high-sensitivity troponin 11 and 18, TSH is 0.964  Imaging: No active cardiopulmonary disease a moderate size hiatal hernia noted on chest x-ray  Medications received in the emergency department: Diltiazem 10 mg bolus IVP  Past Medical History:  Diagnosis Date   Arthritis    Basal cell carcinoma 06/04/2010   left upper back   Chronic kidney disease    Colon polyps    Gastritis    GERD (gastroesophageal reflux disease)    Hiatal hernia    Hyperlipidemia    Hypertension    IBS (irritable bowel syndrome)    Low back pain radiating to left leg 03/28/2020   Sciatica, left side 04/15/2020   Squamous cell carcinoma of skin 06/26/2013   left mid pretibial   Squamous cell carcinoma of skin 08/21/2015   lower sternum   Squamous cell carcinoma of skin 10/14/2017   right mid pretibial   Squamous cell carcinoma of skin 03/17/2018   right prox lateral elbow   Squamous cell carcinoma of skin 09/07/2018   right post thigh   Squamous cell carcinoma of skin 05/11/2019   right mid med pretibial   Squamous cell carcinoma of skin 06/20/2019   right bicep   Supraventricular tachycardia     Past Surgical History:  Procedure Laterality Date   APPENDECTOMY  1959   CARPAL TUNNEL RELEASE     CATARACT EXTRACTION W/ INTRAOCULAR LENS IMPLANT Left    CATARACT EXTRACTION W/PHACO Right 04/20/2018   Procedure: CATARACT EXTRACTION PHACO AND INTRAOCULAR LENS PLACEMENT ([place])  RIGHT;  Surgeon: Leandrew Koyanagi, MD;  Location: [facility];  Service: Ophthalmology;  Laterality: Right;   CHOLECYSTECTOMY  1995   COLONOSCOPY WITH PROPOFOL N/A 07/30/2022   Procedure: COLONOSCOPY WITH PROPOFOL;  Surgeon: Lin Landsman, MD;  Location: [facility] ENDOSCOPY;  Service: Gastroenterology;  Laterality: N/A;   EYE SURGERY     GANGLION CYST EXCISION     JOINT REPLACEMENT Right 03/2008   [place]    KNEE ARTHROSCOPY     LITHOTRIPSY     SALIVARY GLAND SURGERY     SKIN CANCER  EXCISION     TONSILLECTOMY     TONSILLECTOMY AND ADENOIDECTOMY     VEIN LIGATION       Home Medications:  Prior to Admission medications   Medication Sig Start Date End Date Taking? Authorizing Provider  amiodarone (PACERONE) 200 MG tablet Take 1 tablet (200 mg total) by mouth daily. 06/07/22  Yes Jennye Boroughs, MD  Calcium Citrate-Vitamin D (CITRACAL + D PO) Take 2 capsules by mouth every other day. Alternates with Vitamin D3   Yes [provider]  Cholecalciferol (VITAMIN D) 50 MCG (2000 UT) CAPS Take 1 capsule by mouth every other day.   Yes [provider]  clobetasol ointment (TEMOVATE) 2.68 % Apply 1 Application topically 2 (two) times daily. 05/13/22  Yes Crecencio Mc, MD  CRANBERRY PO Take 2 capsules by mouth daily.    Yes [provider]  estradiol (ESTRACE) 0.1 MG/GM vaginal cream 1 Applicatorful every other day.   Yes [provider]  ezetimibe (ZETIA) 10 MG tablet Take 1 tablet (  10 mg total) by mouth daily. 01/13/22  Yes Crecencio Mc, MD  metoprolol succinate (TOPROL XL) 25 MG 24 hr tablet Take 1 tablet (25 mg total) by mouth daily. 05/20/22  Yes Vickie Epley, MD  omeprazole (PRILOSEC) 20 MG capsule TAKE 1 CAPSULE BY MOUTH EVERY DAY 08/24/22  Yes Crecencio Mc, MD  Probiotic Product (ALIGN PO) Take 1 tablet by mouth daily.    Yes [provider]  acetaminophen (TYLENOL) 325 MG tablet Take 650 mg by mouth every 6 (six) hours as needed.    [provider]  furosemide (LASIX) 20 MG tablet Take 1 tablet (20 mg total) by mouth as needed. Take 1-2 tablets per week as needed for swelling 08/26/22   Vickie Epley, MD  hyoscyamine (ANASPAZ) 0.125 MG TBDP disintergrating tablet Place 1 tablet (0.125 mg total) under the tongue every 6 (six) hours as needed (as needed for esophageal spasm). 01/11/20   Crecencio Mc, MD  ondansetron (ZOFRAN ODT) 4 MG disintegrating tablet Take 1 tablet (4 mg total) by mouth every 8 (eight)  hours as needed for nausea or vomiting. Patient not taking: Reported on 08/29/2022 07/04/21   Crecencio Mc, MD    Inpatient Medications: Scheduled Meds:  acidophilus  1 capsule Oral Daily   amiodarone  200 mg Oral Daily   cholecalciferol  2,000 Units Oral QODAY   clobetasol ointment  1 Application Topical Q M,W,F   enoxaparin (LOVENOX) injection  40 mg Subcutaneous Q24H   ezetimibe  10 mg Oral Daily   metoprolol succinate  25 mg Oral Daily   pantoprazole  40 mg Oral Daily   Continuous Infusions:  sodium chloride 100 mL/hr at 08/30/22 2332   diltiazem (CARDIZEM) infusion Stopped (08/30/22 0105)   PRN Meds: acetaminophen **OR** acetaminophen, hyoscyamine, magnesium hydroxide, ondansetron **OR** ondansetron (ZOFRAN) IV, traZODone  Allergies:    Allergies  Allergen Reactions   Ciprofloxacin Diarrhea   Clindamycin/Lincomycin Diarrhea   Sulfa Antibiotics Rash    Rash in throat   Augmentin [Amoxicillin-Pot Clavulanate] Diarrhea   Gabapentin Other (See Comments)    Irregular heartbeat   Lidocaine     Other reaction(s): Not available   Naproxen Sodium Other (See Comments)    Causes gastritis   Prednisone     Atrial Fibrillation   Tramadol Diarrhea    "upset stomach, headache, dizziness, drowsiness"   Codeine Nausea Only and Rash    Upset stomach   Doxycycline Hyclate Nausea And Vomiting and Nausea Only    Dry heaves.    E-Mycin [Erythromycin] Rash   Macrobid [Nitrofurantoin] Rash    severe   Nabumetone Rash and Other (See Comments)    Upsets liver enzymes   Nitrofurantoin Monohyd Macro Rash   Nsaids Rash    Gastritis, GI ulceration.    Phenobarbital Other (See Comments), Rash and Anxiety    "got wild" Patient becomes very hyper and anxious   Statins Rash    Upsets liver enzymes    Social History:   Social History   Socioeconomic History   Marital status: Single    Spouse name: Not on file   Number of children: Not on file   Years of education: Not on file    Highest education level: Not on file  Occupational History   Not on file  Tobacco Use   Smoking status: Never   Smokeless tobacco: Never  Vaping Use   Vaping Use: Never used  Substance and Sexual Activity   Alcohol use:  No    Alcohol/week: 0.0 standard drinks of alcohol   Drug use: No   Sexual activity: Never  Other Topics Concern   Not on file  Social History Narrative   Not on file   Social Determinants of Health   Financial Resource Strain: Low Risk  (01/15/2021)   Overall Financial Resource Strain (CARDIA)    Difficulty of Paying Living Expenses: Not hard at all  Food Insecurity: No Food Insecurity (01/15/2021)   Hunger Vital Sign    Worried About Running Out of Food in the Last Year: Never true    Ran Out of Food in the Last Year: Never true  Transportation Needs: No Transportation Needs (01/15/2021)   PRAPARE - Hydrologist (Medical): No    Lack of Transportation (Non-Medical): No  Physical Activity: Insufficiently Active (01/15/2021)   Exercise Vital Sign    Days of Exercise per Week: 3 days    Minutes of Exercise per Session: 30 min  Stress: No Stress Concern Present (01/15/2021)   [place]    Feeling of Stress : Not at all  Social Connections: Unknown (01/15/2021)   Social Connection and Isolation Panel [NHANES]    Frequency of Communication with Friends and Family: More than three times a week    Frequency of Social Gatherings with Friends and Family: Not on file    Attends Religious Services: Not on file    Active Member of Clubs or Organizations: Not on file    Attends Archivist Meetings: Not on file    Marital Status: Not on file  Intimate Partner Violence: Not At Risk (01/15/2021)   Humiliation, Afraid, Rape, and Kick questionnaire    Fear of Current or Ex-Partner: No    Emotionally Abused: No    Physically Abused: No    Sexually Abused: No     Family History:    Family History  Problem Relation Age of Onset   Heart disease Mother    Arthritis Mother    Diabetes Father      ROS:  Please see the history of present illness.  Review of Systems  Cardiovascular:  Positive for leg swelling.       Tachycardia was chief complaint coming in  Neurological:  Positive for weakness.    All other ROS reviewed and negative.     Physical Exam/Data:   Vitals:   08/31/22 0140 08/31/22 0200 08/31/22 0445 08/31/22 0600  BP: (!) 114/58 118/72  124/75  Pulse: 63 64  (!) 58  Resp: '19 20  18  '$ Temp:   98 F (36.7 C)   TempSrc:      SpO2: 98% 98%  99%  Weight:      Height:        Intake/Output Summary (Last 24 hours) at 08/31/2022 0830 Last data filed at 08/31/2022 0801 Gross per 24 hour  Intake --  Output 1725 ml  Net -1725 ml      08/29/2022   10:08 PM 08/26/2022   12:17 PM 07/30/2022    9:42 AM  Last 3 Weights  Weight (lbs) 131 lb 13.4 oz 132 lb 128 lb  Weight (kg) 59.8 kg 59.875 kg 58.06 kg     Body mass index is 21.94 kg/m.  General:  Well nourished, well developed, in no acute distress HEENT: normal Neck: no JVD Vascular: No carotid bruits; Distal pulses 2+ bilaterally Cardiac:  normal S1, S2; RRR; no murmur  Lungs:  clear to auscultation bilaterally, no wheezing, rhonchi or rales, respirations are unlabored on room air Abd: soft, nontender, no hepatomegaly  Ext: 1+ edema to the bilateral lower extremities, she has a dressing to her right elbow as well as small dressings to her bilateral shins Musculoskeletal:  No deformities, BUE and BLE strength normal and equal Skin: warm and dry  Neuro:  CNs 2-12 intact, no focal abnormalities noted Psych:  Normal affect   EKG:  The EKG was personally reviewed and demonstrates: Consistent with SVT rate of 132, LVH, IVCD Telemetry:  Telemetry was personally reviewed and demonstrates: Sinus rhythm rate in the 60s  Relevant CV Studies: TTE 05/13/2022 1. Left ventricular  ejection fraction, by estimation, is 55 to 60%. The  left ventricle has normal function. The left ventricle has no regional  wall motion abnormalities. Left ventricular diastolic parameters are  consistent with Grade II diastolic  dysfunction (pseudonormalization).   2. Right ventricular systolic function is normal. The right ventricular  size is not well visualized.   3. The mitral valve is normal in structure. Mild mitral valve  regurgitation.   4. The aortic valve was not well visualized. Aortic valve regurgitation  is not visualized. Aortic valve sclerosis is present, with no evidence of  aortic valve stenosis.   5. The inferior vena cava is normal in size with greater than 50%  respiratory variability, suggesting right atrial pressure of 3 mmHg.   Laboratory Data:  High Sensitivity Troponin:   Recent Labs  Lab 08/29/22 2209 08/30/22 0018  TROPONINIHS 11 18*     Chemistry Recent Labs  Lab 08/29/22 2209 08/30/22 0503 08/31/22 0452  NA 141 140 138  K 3.1* 3.9 4.0  CL 112* 112* 111  CO2 '23 26 24  '$ GLUCOSE 99 95 89  BUN 26* 24* 21  CREATININE 0.95 0.83 0.81  CALCIUM 8.2* 8.5* 8.2*  MG 1.8  --   --   GFRNONAA 58* >60 >60  ANIONGAP 6 2* 3*    No results for input(s): "PROT", "ALBUMIN", "AST", "ALT", "ALKPHOS", "BILITOT" in the last 168 hours. Lipids No results for input(s): "CHOL", "TRIG", "HDL", "LABVLDL", "LDLCALC", "CHOLHDL" in the last 168 hours.  Hematology Recent Labs  Lab 08/29/22 2209 08/30/22 0503 08/31/22 0452  WBC 12.8* 10.3 11.0*  RBC 3.57* 3.09* 3.24*  HGB 11.2* 9.8* 10.3*  HCT 35.2* 31.3* 32.2*  MCV 98.6 101.3* 99.4  MCH 31.4 31.7 31.8  MCHC 31.8 31.3 32.0  RDW 20.2* 20.6* 20.6*  PLT 186 159 155   Thyroid  Recent Labs  Lab 08/29/22 2209  TSH 0.964    BNP Recent Labs  Lab 08/29/22 2209  BNP 364.1*    DDimer No results for input(s): "DDIMER" in the last 168 hours.   Radiology/Studies:  DG Chest 2 View  Result Date:  08/29/2022 CLINICAL DATA:  Chest pain, tachycardia EXAM: CHEST - 2 VIEW COMPARISON:  05/31/2022 FINDINGS: Moderate-sized hiatal hernia. Heart is normal size. No confluent airspace opacities or effusions. No acute bony abnormality. IMPRESSION: No active cardiopulmonary disease. Moderate-sized hiatal hernia. Electronically Signed   By: Rolm Baptise M.D.   On: 08/29/2022 22:46     Assessment and Plan:   Paroxysmal SVT -Currently in sinus rhythm -Received 10 mg diltiazem IVP with heart rate drop back into the 60s -Continued on amiodarone 200 mg daily -Continued on metoprolol 25 mg daily -Recommend close follow-up with cardiology outpatient with Dr. Quentin Ore  Hypertension -Blood pressure 125/72 -Blood pressure remained stable -Continued  on Toprol-XL 25 mg daily -Vital signs per unit protocol  Dyslipidemia -Continue on ezetimibe 10 mg daily  Peripheral edema -Previous history of venous ablation -Patient did not tolerate trial on furosemide due to urinary frequency causing incontinence -Furosemide stopped during hospitalization due to hypokalemia -Continue to participate in conservative therapy of elevating extremities, decrease in sodium intake, foot Pumps, and compression stockings   Risk Assessment/Risk Scores:                For questions or updates, please contact [place] Please consult [email or web address] for contact info under    Signed, Kaye Luoma, NP  08/31/2022 8:30 AM

## 2022-08-31 NOTE — Progress Notes (Signed)
Mobility Specialist - Progress Note    08/31/22 1231  Mobility  Activity Ambulated with assistance in hallway  Level of Assistance Modified independent, requires aide device or extra time  Assistive Device Front wheel walker  Distance Ambulated (ft) 160 ft  Activity Response Tolerated well  Mobility Referral Yes  $Mobility charge 1 Mobility   Pt resting in bed upon entry on RA. Pt STS and ambulates ModI in hallway around NS. Pt HR was 72-75 during ambulation and 60-62 once pt return to EOB. Pt left with needs in reach and case worker present in room. RN and MD notified of pt status.   Loma Sender Mobility Specialist 08/31/22, 12:35 PM

## 2022-08-31 NOTE — TOC Initial Note (Signed)
Transition of Care [facility]) - Initial/Assessment Note    Patient Details  Name: Brenda Cardenas MRN: [id number] Date of Birth: 1932-09-12  [facility] CM/SW Contact:    Shelbie Hutching, RN Phone Number: 08/31/2022, 12:58 PM  Clinical Narrative:                 Patient placed under observation for SVT, now medically cleared for discharge back to [facility] independent living.  Patient walks with a rollator at home.  She is getting [facility] to pick her up today.  NO TOC needs identified.   Expected Discharge Plan: Home/Self Care Barriers to Discharge: Barriers Resolved   Patient Goals and CMS Choice Patient states their goals for this hospitalization and ongoing recovery are:: Patient glad to be going home      Expected Discharge Plan and Services Expected Discharge Plan: Home/Self Care   Discharge Planning Services: CM Consult   Living arrangements for the past 2 months: [place] Expected Discharge Date: 08/31/22               DME Arranged: N/A DME Agency: NA       HH Arranged: NA HH Agency: NA        Prior Living Arrangements/Services Living arrangements for the past 2 months: [place] Lives with:: Self Patient language and need for interpreter reviewed:: Yes Do you feel safe going back to the place where you live?: Yes      Need for Family Participation in Patient Care: Yes (Comment) Care giver support system in place?: Yes (comment) Current home services: DME (rollator, 3 wheeled walker) Criminal Activity/Legal Involvement Pertinent to Current Situation/Hospitalization: No - Comment as needed  Activities of Daily Living      Permission Sought/Granted Permission sought to share information with : Case Manager, Customer service manager Permission granted to share information with : Yes, Verbal Permission Granted     Permission granted to share info w AGENCY: [facility]- [facility]        Emotional  Assessment Appearance:: Appears stated age Attitude/Demeanor/Rapport: Engaged Affect (typically observed): Accepting Orientation: : Oriented to Self, Oriented to Place, Oriented to  Time, Oriented to Situation Alcohol / Substance Use: Not Applicable Psych Involvement: No (comment)  Admission diagnosis:  SVT (supraventricular tachycardia) [I47.10] Patient Active Problem List   Diagnosis Date Noted   GERD without esophagitis 08/30/2022   Hypokalemia 08/30/2022   Dyslipidemia 08/30/2022   Lower extremity edema 08/30/2022   Sepsis secondary to UTI ([place]) 08/30/2022   Polyp of descending colon 07/30/2022   Occult blood in stools 07/30/2022   COVID-19 07/02/2022   Anemia 05/31/2022   Palpitations 97/41/6384   Atrophic lichen planus 53/64/6803   SVT (supraventricular tachycardia) 08/13/2021   GAD (generalized anxiety disorder) 11/05/2020   Left ventricular diastolic dysfunction 21/22/4825   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   Aortic atherosclerosis ([place]) 07/08/2020   Primary osteoarthritis of left knee 04/21/2020   Sciatica, left side 04/15/2020   BPPV (benign paroxysmal positional vertigo) 03/14/2019   Educated about COVID-19 virus infection 03/14/2019   Mixed hyperlipidemia 06/20/2018   Varicose veins of bilateral lower extremities with other complications 00/37/0488   Constipation 07/14/2016   Hypovitaminosis D 06/23/2016   Venous stasis of both lower extremities 06/23/2016   Essential hypertension 12/24/2015   History of colonic polyps 12/24/2015   Spinal stenosis of lumbar region 08/13/2015   Idiopathic scoliosis 06/24/2015   Rhinitis due food 12/17/2014   Osteoarthritis of cervical  spine 02/25/2014   Osteoporosis 06/18/2013   Encounter for Medicare annual wellness exam 06/18/2013   Hiatal hernia 06/16/2013   Diaphragmatic hernia 06/16/2013   GERD (gastroesophageal reflux disease) 05/02/2013   S/P total knee replacement 05/02/2013   Artificial knee joint  present 05/02/2013   History of urinary stone 11/03/2012   PCP:  Crecencio Mc, MD Pharmacy:   [place], [place] - [address] [place] [address] Phone: [phone] Fax: [phone]  [place], [place] - [address] [place] [address] Phone: [phone] Fax: [phone]     Social Determinants of Health (SDOH) Interventions    Readmission Risk Interventions     No data to display

## 2022-08-31 NOTE — ED Notes (Signed)
30 yof lying supine in the bed with her slightly elevated. The pt was warm, pink, and dry. The pt was alert and oriented x 4. The pt's suction canister was emptied.

## 2022-08-31 NOTE — Care Management CC44 (Signed)
Condition Code 44 Documentation Completed  Patient Details  Name: [name] MRN: [id number] Date of Birth: 08/20/1932   Condition Code 44 given:  Yes Patient signature on Condition Code 44 notice:  Yes Documentation of 2 MD's agreement:  Yes Code 44 added to claim:  Yes    Shelbie Hutching, RN 08/31/2022, 12:11 PM

## 2022-08-31 NOTE — Care Management Obs Status (Signed)
[place] NOTIFICATION   Patient Details  Name: Brenda Cardenas MRN: [id number] Date of Birth: 03/20/1932   Medicare Observation Status Notification Given:  Yes    Shelbie Hutching, RN 08/31/2022, 12:11 PM

## 2022-08-31 NOTE — Discharge Instructions (Signed)
Pt to follow Dr Mardene Speak recommendation regarding taking metoprolol as before with episodes of tachycardia/SVT

## 2022-09-01 ENCOUNTER — Telehealth: Payer: Self-pay | Admitting: Cardiology

## 2022-09-01 ENCOUNTER — Telehealth: Payer: Self-pay | Admitting: Internal Medicine

## 2022-09-01 NOTE — Telephone Encounter (Signed)
Pt states she was seen in the ED and was told to f/u with Dr. Quentin Ore. She states she believes the lasix she was put on put her in the hospital. Informed her he doesn't have any appts this week that I see. Pt wants to know what she should do moving forward

## 2022-09-01 NOTE — Telephone Encounter (Signed)
Patient just got out of the hospital, yesterday. She had a fast beating heart rate. She will be seeing her cardiologist tomorrow, but the hospital told her she needs to also see her primary care provider, because of medication changes. No available appointments this week.
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
## 2022-09-02 ENCOUNTER — Ambulatory Visit: Payer: Medicare Other | Attending: Internal Medicine | Admitting: Internal Medicine

## 2022-09-02 ENCOUNTER — Encounter: Payer: Self-pay | Admitting: Internal Medicine

## 2022-09-02 VITALS — BP 130/70 | HR 65 | Ht 65.0 in | Wt 127.0 lb

## 2022-09-02 DIAGNOSIS — R600 Localized edema: Secondary | ICD-10-CM | POA: Diagnosis not present

## 2022-09-02 DIAGNOSIS — I471 Supraventricular tachycardia, unspecified: Secondary | ICD-10-CM | POA: Diagnosis not present

## 2022-09-02 NOTE — Progress Notes (Unsigned)
Follow-up Outpatient Visit Date: 09/02/2022  Primary Care Provider: Crecencio Mc, MD [address] [place] [address]  Chief Complaint: Follow-up SVT  HPI:  Brenda Cardenas is a 88 y.o. female with history of SVT, hypertension, hyperlipidemia, GERD, IBS, arthritis, and low back pain, who presents for follow-up of SVT.  I last saw her in mid October, at which time she was feeling fairly well without recurrent episodes of SVT after having been started on amiodarone by Dr. Quentin Ore.  She saw Dr. Quentin Ore in the office last week, at which time she reported a single episode of SVT that resolved after taking an additional dose of metoprolol.  She was started on as needed furosemide for leg edema.  She presented to the [facility] emergency department 4 days ago with palpitations.  She continued to have palpitations as well as dysuria and urinary frequency when she arrived in the ED.  EKG showed SVT, though ED note indicates atrial fibrillation.  Potassium was slightly low at 3.1.  Potassium was repleted and IV diltiazem administered.  She was monitored overnight and ultimately discharged on amiodarone and metoprolol with instructions to follow-up with Dr. Quentin Ore.  She presents to see me today instead.  She has not had any further palpitations since leaving the hospital.  She also denies chest pain, shortness of breath, and lightheadedness.  She continues to have leg edema.  She only took 1 or 2 doses of furosemide prescribed by Dr. Quentin Ore at their last visit and states she does not wish to take it anymore due to associated urinary frequency and incontinence.  She notes having significant urinary frequency dating back to the spring.  She wonders if it could be related to lichen planus.  She has been seeing her urologist at [facility] regularly for further workup.  --------------------------------------------------------------------------------------------------  Past Medical History:  Diagnosis Date    Arthritis    Basal cell carcinoma 06/04/2010   left upper back   Chronic kidney disease    Colon polyps    Gastritis    GERD (gastroesophageal reflux disease)    Hiatal hernia    Hyperlipidemia    Hypertension    IBS (irritable bowel syndrome)    Low back pain radiating to left leg 03/28/2020   Sciatica, left side 04/15/2020   Squamous cell carcinoma of skin 06/26/2013   left mid pretibial   Squamous cell carcinoma of skin 08/21/2015   lower sternum   Squamous cell carcinoma of skin 10/14/2017   right mid pretibial   Squamous cell carcinoma of skin 03/17/2018   right prox lateral elbow   Squamous cell carcinoma of skin 09/07/2018   right post thigh   Squamous cell carcinoma of skin 05/11/2019   right mid med pretibial   Squamous cell carcinoma of skin 06/20/2019   right bicep   Supraventricular tachycardia    Past Surgical History:  Procedure Laterality Date   APPENDECTOMY  1959   CARPAL TUNNEL RELEASE     CATARACT EXTRACTION W/ INTRAOCULAR LENS IMPLANT Left    CATARACT EXTRACTION W/PHACO Right 04/20/2018   Procedure: CATARACT EXTRACTION PHACO AND INTRAOCULAR LENS PLACEMENT ([place])  RIGHT;  Surgeon: Leandrew Koyanagi, MD;  Location: [facility];  Service: Ophthalmology;  Laterality: Right;   CHOLECYSTECTOMY  1995   COLONOSCOPY WITH PROPOFOL N/A 07/30/2022   Procedure: COLONOSCOPY WITH PROPOFOL;  Surgeon: Lin Landsman, MD;  Location[facility] ENDOSCOPY;  Service: Gastroenterology;  Laterality: N/A;   EYE SURGERY     GANGLION  CYST EXCISION     JOINT REPLACEMENT Right 03/2008   [place]    KNEE ARTHROSCOPY     LITHOTRIPSY     SALIVARY GLAND SURGERY     SKIN CANCER EXCISION     TONSILLECTOMY     TONSILLECTOMY AND ADENOIDECTOMY     VEIN LIGATION      Current Meds  Medication Sig   acetaminophen (TYLENOL) 325 MG tablet Take 650 mg by mouth every 6 (six) hours as needed.   amiodarone (PACERONE) 200 MG tablet Take 1 tablet (200 mg total) by mouth daily.    Calcium Citrate-Vitamin D (CITRACAL + D PO) Take 2 capsules by mouth every other day. Alternates with Vitamin D3   Cholecalciferol (VITAMIN D) 50 MCG (2000 UT) CAPS Take 1 capsule by mouth every other day.   clobetasol ointment (TEMOVATE) 2.94 % Apply 1 Application topically 2 (two) times daily.   CRANBERRY PO Take 2 capsules by mouth daily.    estradiol (ESTRACE) 0.1 MG/GM vaginal cream 1 Applicatorful every other day.   ezetimibe (ZETIA) 10 MG tablet Take 1 tablet (10 mg total) by mouth daily.   hyoscyamine (ANASPAZ) 0.125 MG TBDP disintergrating tablet Place 1 tablet (0.125 mg total) under the tongue every 6 (six) hours as needed (as needed for esophageal spasm).   metoprolol succinate (TOPROL XL) 25 MG 24 hr tablet Take 1 tablet (25 mg total) by mouth daily.   omeprazole (PRILOSEC) 20 MG capsule TAKE 1 CAPSULE BY MOUTH EVERY DAY   Probiotic Product (ALIGN PO) Take 1 tablet by mouth daily.     Allergies: Ciprofloxacin, Clindamycin/lincomycin, Sulfa antibiotics, Augmentin [amoxicillin-pot clavulanate], Gabapentin, Lidocaine, Naproxen sodium, Prednisone, Tramadol, Codeine, Doxycycline hyclate, E-mycin [erythromycin], Macrobid [nitrofurantoin], Nabumetone, Nitrofurantoin monohyd macro, Nsaids, Phenobarbital, and Statins  Social History   Tobacco Use   Smoking status: Never   Smokeless tobacco: Never  Vaping Use   Vaping Use: Never used  Substance Use Topics   Alcohol use: No    Alcohol/week: 0.0 standard drinks of alcohol   Drug use: No    Family History  Problem Relation Age of Onset   Heart disease Mother    Arthritis Mother    Diabetes Father     Review of Systems: A 12-system review of systems was performed and was negative except as noted in the HPI.  --------------------------------------------------------------------------------------------------  Physical Exam: BP 130/70 (BP Location: Left Arm, Patient Position: Sitting, Cuff Size: Normal)   Pulse 65   Ht '5\' 5"'$   (1.651 m)   Wt 127 lb (57.6 kg)   SpO2 97%   BMI 21.13 kg/m   General:  NAD. Neck: No JVD or HJR. Lungs: Clear to auscultation bilaterally without wheezes or crackles. Heart: Regular rate and rhythm without murmurs, rubs, or gallops. Abdomen: Soft, nontender, nondistended. Extremities: 2+ pretibial edema bilaterally.  EKG: Normal sinus rhythm with left atrial enlargement, left axis deviation, and poor R wave progression.  Compared to prior tracing from 08/29/2022, normal sinus rhythm has replaced SVT.  Lab Results  Component Value Date   WBC 11.0 (H) 08/31/2022   HGB 10.3 (L) 08/31/2022   HCT 32.2 (L) 08/31/2022   MCV 99.4 08/31/2022   PLT 155 08/31/2022    Lab Results  Component Value Date   NA 138 08/31/2022   K 4.0 08/31/2022   CL 111 08/31/2022   CO2 24 08/31/2022   BUN 21 08/31/2022   CREATININE 0.81 08/31/2022   GLUCOSE 89 08/31/2022   ALT 19 07/15/2022  Lab Results  Component Value Date   CHOL 150 05/28/2022   HDL 54.00 05/28/2022   LDLCALC 79 05/28/2022   LDLDIRECT 103.0 06/16/2017   TRIG 86.0 05/28/2022   CHOLHDL 3 05/28/2022    --------------------------------------------------------------------------------------------------  ASSESSMENT AND PLAN: SVT: Brenda Cardenas has continued to have episodic SVT, leading to her recent hospitalization.  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  I have spoken with Dr. Quentin Ore, who recommends a "mini reload" of amiodarone.  We will therefore have Brenda Cardenas increase amiodarone to 200 mg twice daily for 5 days, after which time she should return to 200 mg daily.  Continue current dose of metoprolol.  Leg edema: This is likely multifactorial but likely driven primarily by venous insufficiency.  Brenda Cardenas did not tolerate even low-dose furosemide well due to urinary frequency.  We discussed alternative diuretic such as HCTZ, though Ms.  Cardenas is not interested in any further diuretics at this juncture.  I have encouraged her to elevate her legs whenever possible as well as to continue using compression stockings.  Follow-up: Return to see Dr. Quentin Ore in 3 months.  Nelva Bush, MD 09/02/2022 3:55 PM

## 2022-09-02 NOTE — Patient Instructions (Signed)
Medication Instructions:  INCREASE Amiodarone to 200 mg twice a day for 5 days, then resume 200 mg daily.  *If you need a refill on your cardiac medications before your next appointment, please call your pharmacy*   Lab Work: None ordered today   Testing/Procedures: None ordered today   Follow-Up: At [facility], you and your health needs are our priority.  As part of our continuing mission to provide you with exceptional heart care, we have created designated Provider Care Teams.  These Care Teams include your primary Cardiologist (physician) and Advanced Practice Providers (APPs -  Physician Assistants and Nurse Practitioners) who all work together to provide you with the care you need, when you need it.  We recommend signing up for the patient portal called "MyChart".  Sign up information is provided on this After Visit Summary.  MyChart is used to connect with patients for Virtual Visits (Telemedicine).  Patients are able to view lab/test results, encounter notes, upcoming appointments, etc.  Non-urgent messages can be sent to your provider as well.   To learn more about what you can do with MyChart, go to [email or web address].    Your next appointment:   3 month(s)  The format for your next appointment:   In Person  Provider:   Lars Mage, MD

## 2022-09-03 ENCOUNTER — Encounter: Payer: Self-pay | Admitting: Internal Medicine

## 2022-09-03 NOTE — Telephone Encounter (Signed)
Pt has been scheduled for an ED follow up on 09/14/2022.

## 2022-09-04 LAB — CULTURE, BLOOD (ROUTINE X 2)
Culture: NO GROWTH
Culture: NO GROWTH
Special Requests: ADEQUATE

## 2022-09-14 ENCOUNTER — Ambulatory Visit (INDEPENDENT_AMBULATORY_CARE_PROVIDER_SITE_OTHER): Payer: Medicare Other | Admitting: Internal Medicine

## 2022-09-14 ENCOUNTER — Encounter: Payer: Self-pay | Admitting: Internal Medicine

## 2022-09-14 VITALS — BP 138/78 | HR 70 | Temp 97.5°F | Ht 65.0 in | Wt 132.4 lb

## 2022-09-14 DIAGNOSIS — Z86010 Personal history of colonic polyps: Secondary | ICD-10-CM

## 2022-09-14 DIAGNOSIS — R600 Localized edema: Secondary | ICD-10-CM

## 2022-09-14 DIAGNOSIS — E876 Hypokalemia: Secondary | ICD-10-CM

## 2022-09-14 DIAGNOSIS — I519 Heart disease, unspecified: Secondary | ICD-10-CM

## 2022-09-14 DIAGNOSIS — Z09 Encounter for follow-up examination after completed treatment for conditions other than malignant neoplasm: Secondary | ICD-10-CM

## 2022-09-14 DIAGNOSIS — L439 Lichen planus, unspecified: Secondary | ICD-10-CM

## 2022-09-14 DIAGNOSIS — I471 Supraventricular tachycardia, unspecified: Secondary | ICD-10-CM

## 2022-09-14 LAB — BASIC METABOLIC PANEL
BUN: 28 mg/dL — ABNORMAL HIGH (ref 6–23)
CO2: 26 mEq/L (ref 19–32)
Calcium: 9 mg/dL (ref 8.4–10.5)
Chloride: 106 mEq/L (ref 96–112)
Creatinine, Ser: 1 mg/dL (ref 0.40–1.20)
GFR: 50.21 mL/min — ABNORMAL LOW (ref >60.00)
Glucose, Bld: 114 mg/dL — ABNORMAL HIGH (ref 70–99)
Potassium: 3.6 mEq/L (ref 3.5–5.1)
Sodium: 140 mEq/L (ref 135–145)

## 2022-09-14 MED ORDER — SERTRALINE HCL 50 MG PO TABS: 50.0000 mg | ORAL_TABLET | Freq: Every day | ORAL | 3 refills | Status: DC

## 2022-09-14 NOTE — Assessment & Plan Note (Signed)
Managed with amiodarone and metoprolol

## 2022-09-14 NOTE — Assessment & Plan Note (Signed)
Symptoms are uncontrolled since her doses of estrogen cream and clobetasol were reduced.  Advised to resume both daily and follow up with urology

## 2022-09-14 NOTE — Progress Notes (Signed)
Subjective:  Patient ID: Brenda Cardenas, female    DOB: May 22, 1932  Age: 88 y.o. MRN: [id number]  CC: The primary encounter diagnosis was History of colonic polyps. Diagnoses of Hypokalemia, Hospital discharge follow-up, Left ventricular diastolic dysfunction, Leg edema, SVT (supraventricular tachycardia), and Atrophic lichen planus were also pertinent to this visit.   HPI Brenda Cardenas presents for hospital follow up.  Chief Complaint  Patient presents with   Hospitalization Follow-up   Brenda Cardenas is an 88 yr old female with a histo  yof SVT who was admitted to [facility]  on Dec 2 with afib with  RVR   that did not respond to taking an extra dose of metoprolol.  She was admitted with a rate of 150,  volume overload without CHF, and hypokalemia.  She was also reporting dysuria but had no evidence of UTI  . None found.  Given TEDS for LE edema  .  Was given a purewick bladder catheter during her stay , due to enforced bedrest.   Discharged on Dec 4  on same amiodarone dose (200 mg daily)  She has since followed up with cardiology and Dr Quentin Ore advised her  to increase amiodarone dose to to bid for several days. He also prescribed lasix for use 2 times weekly but she could not tolerate one dose due to  frequency that resulted in a fall while rushing to the commode.    Feeling better.  Hosted a Christmas dinner this weekend, no recurrence   Wants to start an anxiety medication (refused previously) afer her family commented on her  mood.  Seeing  a urologist [facility] in [place] for persistent dysuria during urination ,  history of lichen planus,   post void residual was negative in June. Using clobetasol but now every other day per gynecology     Outpatient Medications Prior to Visit  Medication Sig Dispense Refill   acetaminophen (TYLENOL) 325 MG tablet Take 650 mg by mouth every 6 (six) hours as needed.     amiodarone (PACERONE) 200 MG tablet Take 1 tablet (200 mg total) by mouth daily. 90 tablet 3    Calcium Citrate-Vitamin D (CITRACAL + D PO) Take 2 capsules by mouth every other day. Alternates with Vitamin D3     Cholecalciferol (VITAMIN D) 50 MCG (2000 UT) CAPS Take 1 capsule by mouth every other day.     clobetasol ointment (TEMOVATE) 8.12 % Apply 1 Application topically 2 (two) times daily. 60 g 1   CRANBERRY PO Take 2 capsules by mouth daily.      estradiol (ESTRACE) 0.1 MG/GM vaginal cream 1 Applicatorful every other day.     ezetimibe (ZETIA) 10 MG tablet Take 1 tablet (10 mg total) by mouth daily. 90 tablet 3   hyoscyamine (ANASPAZ) 0.125 MG TBDP disintergrating tablet Place 1 tablet (0.125 mg total) under the tongue every 6 (six) hours as needed (as needed for esophageal spasm). 30 tablet 0   metoprolol succinate (TOPROL XL) 25 MG 24 hr tablet Take 1 tablet (25 mg total) by mouth daily. 30 tablet 11   omeprazole (PRILOSEC) 20 MG capsule TAKE 1 CAPSULE BY MOUTH EVERY DAY 90 capsule 1   Probiotic Product (ALIGN PO) Take 1 tablet by mouth daily.      furosemide (LASIX) 20 MG tablet Take 1 tablet (20 mg total) by mouth as needed. Take 1-2 tablets per week as needed for swelling (Patient not taking: Reported on 09/14/2022) 45 tablet 3   No facility-administered medications prior  to visit.    Review of Systems;  Patient denies headache, fevers, malaise, unintentional weight loss, skin rash, eye pain, sinus congestion and sinus pain, sore throat, dysphagia,  hemoptysis , cough, dyspnea, wheezing, chest pain, palpitations, orthopnea, edema, abdominal pain, nausea, melena, diarrhea, constipation, flank pain, dysuria, hematuria, urinary  Frequency, nocturia, numbness, tingling, seizures,  Focal weakness, Loss of consciousness,  Tremor, insomnia, depression, anxiety, and suicidal ideation.      Objective:  BP 138/78   Pulse 70   Temp (!) 97.5 F (36.4 C) (Oral)   Ht '5\' 5"'$  (1.651 m)   Wt 132 lb 6.4 oz (60.1 kg)   SpO2 99%   BMI 22.03 kg/m   BP Readings from Last 3 Encounters:   09/14/22 138/78  09/02/22 130/70  08/31/22 125/72    Wt Readings from Last 3 Encounters:  09/14/22 132 lb 6.4 oz (60.1 kg)  09/02/22 127 lb (57.6 kg)  08/29/22 131 lb 13.4 oz (59.8 kg)    Physical Exam Vitals reviewed.  Constitutional:      General: She is not in acute distress.    Appearance: Normal appearance. She is normal weight. She is not ill-appearing, toxic-appearing or diaphoretic.  HENT:     Head: Normocephalic.  Eyes:     General: No scleral icterus.       Right eye: No discharge.        Left eye: No discharge.     Conjunctiva/sclera: Conjunctivae normal.  Cardiovascular:     Rate and Rhythm: Normal rate and regular rhythm.     Heart sounds: Normal heart sounds.  Pulmonary:     Effort: Pulmonary effort is normal. No respiratory distress.     Breath sounds: Normal breath sounds.  Musculoskeletal:        General: Normal range of motion.  Skin:    General: Skin is warm and dry.  Neurological:     General: No focal deficit present.     Mental Status: She is alert and oriented to person, place, and time. Mental status is at baseline.  Psychiatric:        Mood and Affect: Mood normal.        Behavior: Behavior normal.        Thought Content: Thought content normal.        Judgment: Judgment normal.     Lab Results  Component Value Date   HGBA1C 5.4 01/05/2020   HGBA1C 5.5 06/16/2018   HGBA1C 5.7 03/24/2018    Lab Results  Component Value Date   CREATININE 1.00 09/14/2022   CREATININE 0.81 08/31/2022   CREATININE 0.83 08/30/2022    Lab Results  Component Value Date   WBC 11.0 (H) 08/31/2022   HGB 10.3 (L) 08/31/2022   HCT 32.2 (L) 08/31/2022   PLT 155 08/31/2022   GLUCOSE 114 (H) 09/14/2022   CHOL 150 05/28/2022   TRIG 86.0 05/28/2022   HDL 54.00 05/28/2022   LDLDIRECT 103.0 06/16/2017   LDLCALC 79 05/28/2022   ALT 19 07/15/2022   AST 21 07/15/2022   NA 140 09/14/2022   K 3.6 09/14/2022   CL 106 09/14/2022   CREATININE 1.00 09/14/2022    BUN 28 (H) 09/14/2022   CO2 26 09/14/2022   TSH 0.964 08/29/2022   INR 1.2 08/30/2022   HGBA1C 5.4 01/05/2020   MICROALBUR 1.0 01/05/2020    DG Chest 2 View  Result Date: 08/29/2022 CLINICAL DATA:  Chest pain, tachycardia EXAM: CHEST - 2 VIEW COMPARISON:  05/31/2022 FINDINGS:  Moderate-sized hiatal hernia. Heart is normal size. No confluent airspace opacities or effusions. No acute bony abnormality. IMPRESSION: No active cardiopulmonary disease. Moderate-sized hiatal hernia. Electronically Signed   By: Rolm Baptise M.D.   On: 08/29/2022 22:46    Assessment & Plan:  .History of colonic polyps  Hypokalemia -     Basic metabolic panel  Hospital discharge follow-up Assessment & Plan: Patient is stable post discharge and has no new issues or questions about discharge plans at the visit today for hospital follow up. All labs , imaging studies and progress notes from admission were reviewed with patient today      Left ventricular diastolic dysfunction Assessment & Plan: Persistent by 2023 ECHO. Continue metoprololA and rate control with amiodarone.  She is intolerant of furosemide    Leg edema Assessment & Plan: Multifactorial,  with venous insufficiency and diastolic dysfunction.  Intolerant of diuretics,  continue compression stockings.    SVT (supraventricular tachycardia) Assessment & Plan: Managed with amiodarone and metoprolol    Atrophic lichen planus Assessment & Plan: Symptoms are uncontrolled since her doses of estrogen cream and clobetasol were reduced.  Advised to resume both daily and follow up with urology   Other orders -     Sertraline HCl; Take 1 tablet (50 mg total) by mouth daily.  Dispense: 30 tablet; Refill: 3     I provided 30 minutes of face-to-face time during this encounter reviewing patient's last visit with me, patient's  most recent visit with cardiology,  nephrology,  and neurology,  recent surgical and non surgical procedures, previous  labs  and imaging studies, counseling on currently addressed issues,  and post visit ordering to diagnostics and therapeutics .   Follow-up: Return in about 4 weeks (around 10/12/2022) for anxiety.   Crecencio Mc, MD

## 2022-09-14 NOTE — Patient Instructions (Addendum)
Go back to using clobetasol daily  on the urethra and vagina to see if the burning symptoms resolve  Go back to using the estrogen cream daily on the vaginal areas daily   Please start the  Sertraline (generic for Zoloft) at 1/2 tablet daily in the morning with breakfast for the first 2 weeks to avoid nausea.  You can increase to a full tablet after 2 weeks if you have   not developed side effects of nausea.   You should start to feel a difference after two weeks on the full dose   Moisturize the legs 2 times daily to prevent skin tears  Elevate your legs whenever you can   Try taking the 1/2 lasix dose twice a week

## 2022-09-14 NOTE — Assessment & Plan Note (Signed)
Multifactorial,  with venous insufficiency and diastolic dysfunction.  Intolerant of diuretics,  continue compression stockings.

## 2022-09-14 NOTE — Assessment & Plan Note (Signed)
Persistent by 2023 ECHO. Continue metoprololA and rate control with amiodarone.  She is intolerant of furosemide

## 2022-09-14 NOTE — Assessment & Plan Note (Signed)
Patient is stable post discharge and has no new issues or questions about discharge plans at the visit today for hospital follow up. All labs , imaging studies and progress notes from admission were reviewed with patient today   

## 2022-09-19 ENCOUNTER — Other Ambulatory Visit: Payer: Self-pay | Admitting: Cardiology

## 2022-09-22 ENCOUNTER — Telehealth: Payer: Self-pay | Admitting: Internal Medicine

## 2022-09-22 ENCOUNTER — Encounter: Payer: Self-pay | Admitting: Internal Medicine

## 2022-09-22 NOTE — Telephone Encounter (Signed)
Pt called wanting to talk to the provider about the swelling of her legs and the medication lasix. Pt took half a lasix and the swelling went down but the nurse at [facility] want to see if the pt needs to continue the lasix medication.

## 2022-09-23 NOTE — Telephone Encounter (Signed)
Pt advised.

## 2022-09-25 ENCOUNTER — Telehealth: Payer: Self-pay | Admitting: Internal Medicine

## 2022-09-25 DIAGNOSIS — R600 Localized edema: Secondary | ICD-10-CM

## 2022-09-25 NOTE — Telephone Encounter (Signed)
Pt is aware and gave a verbal understanding.  

## 2022-09-25 NOTE — Telephone Encounter (Signed)
Pt its requesting a referral for vascular vein- Dr. Leotis Pain, for compression stockings.

## 2022-09-25 NOTE — Telephone Encounter (Signed)
LMTCB

## 2022-09-29 ENCOUNTER — Ambulatory Visit (INDEPENDENT_AMBULATORY_CARE_PROVIDER_SITE_OTHER): Payer: Medicare Other | Admitting: Vascular Surgery

## 2022-09-29 ENCOUNTER — Encounter (INDEPENDENT_AMBULATORY_CARE_PROVIDER_SITE_OTHER): Payer: Self-pay | Admitting: Vascular Surgery

## 2022-09-29 VITALS — BP 155/65 | HR 61 | Resp 16 | Ht 65.0 in | Wt 134.0 lb

## 2022-09-29 DIAGNOSIS — R600 Localized edema: Secondary | ICD-10-CM

## 2022-09-29 DIAGNOSIS — I10 Essential (primary) hypertension: Secondary | ICD-10-CM | POA: Diagnosis not present

## 2022-09-29 DIAGNOSIS — E782 Mixed hyperlipidemia: Secondary | ICD-10-CM

## 2022-09-29 DIAGNOSIS — I83893 Varicose veins of bilateral lower extremities with other complications: Secondary | ICD-10-CM

## 2022-09-29 NOTE — Progress Notes (Signed)
Patient ID: Brenda Cardenas, female   DOB: Jan 24, 1932, 89 y.o.   MRN: [id number]  No chief complaint on file.   HPI Brenda Cardenas is a 89 y.o. female.  I am asked to see the patient by *** for evaluation of ***.     Past Medical History:  Diagnosis Date   Arthritis    Basal cell carcinoma 06/04/2010   left upper back   Chronic kidney disease    Colon polyps    Gastritis    GERD (gastroesophageal reflux disease)    Hiatal hernia    Hyperlipidemia    Hypertension    IBS (irritable bowel syndrome)    Low back pain radiating to left leg 03/28/2020   Sciatica, left side 04/15/2020   Squamous cell carcinoma of skin 06/26/2013   left mid pretibial   Squamous cell carcinoma of skin 08/21/2015   lower sternum   Squamous cell carcinoma of skin 10/14/2017   right mid pretibial   Squamous cell carcinoma of skin 03/17/2018   right prox lateral elbow   Squamous cell carcinoma of skin 09/07/2018   right post thigh   Squamous cell carcinoma of skin 05/11/2019   right mid med pretibial   Squamous cell carcinoma of skin 06/20/2019   right bicep   Supraventricular tachycardia     Past Surgical History:  Procedure Laterality Date   APPENDECTOMY  1959   CARPAL TUNNEL RELEASE     CATARACT EXTRACTION W/ INTRAOCULAR LENS IMPLANT Left    CATARACT EXTRACTION W/PHACO Right 04/20/2018   Procedure: CATARACT EXTRACTION PHACO AND INTRAOCULAR LENS PLACEMENT ([place])  RIGHT;  Surgeon: Leandrew Koyanagi, MD;  Location: [facility];  Service: Ophthalmology;  Laterality: Right;   CHOLECYSTECTOMY  1995   COLONOSCOPY WITH PROPOFOL N/A 07/30/2022   Procedure: COLONOSCOPY WITH PROPOFOL;  Surgeon: Lin Landsman, MD;  Location: [facility];  Service: Gastroenterology;  Laterality: N/A;   EYE SURGERY     GANGLION CYST EXCISION     JOINT REPLACEMENT Right 03/2008   [facility]    KNEE ARTHROSCOPY     LITHOTRIPSY     SALIVARY GLAND SURGERY     SKIN CANCER EXCISION     TONSILLECTOMY      TONSILLECTOMY AND ADENOIDECTOMY     VEIN LIGATION       Family History  Problem Relation Age of Onset   Heart disease Mother    Arthritis Mother    Diabetes Father    ***   Social History   Tobacco Use   Smoking status: Never   Smokeless tobacco: Never  Vaping Use   Vaping Use: Never used  Substance Use Topics   Alcohol use: No    Alcohol/week: 0.0 standard drinks of alcohol   Drug use: No   ***  Allergies  Allergen Reactions   Ciprofloxacin Diarrhea   Clindamycin/Lincomycin Diarrhea   Sulfa Antibiotics Rash    Rash in throat   Augmentin [Amoxicillin-Pot Clavulanate] Diarrhea   Gabapentin Other (See Comments)    Irregular heartbeat   Lidocaine     Other reaction(s): Not available   Naproxen Sodium Other (See Comments)    Causes gastritis   Prednisone     Atrial Fibrillation   Tramadol Diarrhea    "upset stomach, headache, dizziness, drowsiness"   Codeine Nausea Only and Rash    Upset stomach   Doxycycline Hyclate Nausea And Vomiting and Nausea Only    Dry heaves.    E-Mycin [Erythromycin] Rash   Macrobid [  Nitrofurantoin] Rash    severe   Nabumetone Rash and Other (See Comments)    Upsets liver enzymes   Nitrofurantoin Monohyd Macro Rash   Nsaids Rash    Gastritis, GI ulceration.    Phenobarbital Other (See Comments), Rash and Anxiety    "got wild" Patient becomes very hyper and anxious   Statins Rash    Upsets liver enzymes    Current Outpatient Medications  Medication Sig Dispense Refill   acetaminophen (TYLENOL) 325 MG tablet Take 650 mg by mouth every 6 (six) hours as needed.     amiodarone (PACERONE) 200 MG tablet Take 1 tablet (200 mg total) by mouth daily. 90 tablet 3   Calcium Citrate-Vitamin D (CITRACAL + D PO) Take 2 capsules by mouth every other day. Alternates with Vitamin D3     Cholecalciferol (VITAMIN D) 50 MCG (2000 UT) CAPS Take 1 capsule by mouth every other day.     clobetasol ointment (TEMOVATE) 7.06 % Apply 1 Application  topically 2 (two) times daily. 60 g 1   CRANBERRY PO Take 2 capsules by mouth daily.      estradiol (ESTRACE) 0.1 MG/GM vaginal cream 1 Applicatorful every other day.     ezetimibe (ZETIA) 10 MG tablet Take 1 tablet (10 mg total) by mouth daily. 90 tablet 3   furosemide (LASIX) 20 MG tablet Take 1 tablet (20 mg total) by mouth as needed. Take 1-2 tablets per week as needed for swelling (Patient not taking: Reported on 09/14/2022) 45 tablet 3   hyoscyamine (ANASPAZ) 0.125 MG TBDP disintergrating tablet Place 1 tablet (0.125 mg total) under the tongue every 6 (six) hours as needed (as needed for esophageal spasm). 30 tablet 0   metoprolol succinate (TOPROL-XL) 25 MG 24 hr tablet Take 1 tablet (25 mg total) by mouth daily. 30 tablet 11   omeprazole (PRILOSEC) 20 MG capsule TAKE 1 CAPSULE BY MOUTH EVERY DAY 90 capsule 1   Probiotic Product (ALIGN PO) Take 1 tablet by mouth daily.      sertraline (ZOLOFT) 50 MG tablet Take 1 tablet (50 mg total) by mouth daily. 30 tablet 3   No current facility-administered medications for this visit.      REVIEW OF SYSTEMS (Negative unless checked)  Constitutional: _0 Weight loss  _1 Fever  _2 Chills Cardiac: _3 Chest pain   _4 Chest pressure   _5 Palpitations   _6 Shortness of breath when laying flat   _7 Shortness of breath at rest   _8 Shortness of breath with exertion. Vascular:  _9 Pain in legs with walking   _10 Pain in legs at rest   _11 Pain in legs when laying flat   _12 Claudication   _13 Pain in feet when walking  _14 Pain in feet at rest  _15 Pain in feet when laying flat   _16 History of DVT   _17 Phlebitis   _18 Swelling in legs   _19 Varicose veins   _20 Non-healing ulcers Pulmonary:   _21 Uses home oxygen   _22 Productive cough   _23 Hemoptysis   _24 Wheeze  _25 COPD   _26 Asthma Neurologic:  _27 Dizziness  _28 Blackouts   _29 Seizures   _30 History of stroke   _31 History of TIA  _32 Aphasia   _33 Temporary blindness   _34 Dysphagia   _35 Weakness or numbness in arms   _36 Weakness or numbness in  legs Musculoskeletal:  _37 Arthritis   _38 Joint swelling   _39 Joint pain   _40 Low back pain Hematologic:  _41 Easy bruising  _42 Easy bleeding   _43 Hypercoagulable state   _44 Anemic  _45 Hepatitis Gastrointestinal:  _46 Blood in stool   _47 Vomiting blood  _48 Gastroesophageal reflux/heartburn   _49   Abdominal pain Genitourinary:  _0 Chronic kidney disease   _1 Difficult urination  _2 Frequent urination  _3 Burning with urination   _4 Hematuria Skin:  _5 Rashes   _6 Ulcers   _7 Wounds Psychological:  _8 History of anxiety   _9  History of major depression.    Physical Exam There were no vitals taken for this visit. Gen:  WD/WN, NAD Head: [place]/AT, No temporalis wasting. Prominent temp pulse not noted. Ear/Nose/Throat: Hearing grossly intact, nares w/o erythema or drainage, oropharynx w/o Erythema/Exudate Eyes: Conjunctiva clear, sclera non-icteric  Neck: trachea midline.  No JVD.  Pulmonary:  Good air movement, respirations not labored, no use of accessory muscles  Cardiac: RRR, no JVD Vascular: *** Vessel Right Left  Radial Palpable Palpable                                   Gastrointestinal:. No masses, surgical incisions, or scars. Musculoskeletal: M/S 5/5 throughout.  Extremities without ischemic changes.  No deformity or atrophy. *** edema. Neurologic: Sensation grossly intact in extremities.  Symmetrical.  Speech is fluent. Motor exam as listed above. Psychiatric: Judgment intact, Mood & affect appropriate for pt's clinical situation. Dermatologic: No rashes or ulcers noted.  No cellulitis or open wounds.    Radiology No results found.  Labs Recent Results (from the past 2160 hour(s))  T4, free     Status: Abnormal   Collection Time: 07/15/22  9:48 AM  Result Value Ref Range   Free T4 1.26 (H) 0.61 - 1.12 ng/dL    Comment: (NOTE) Biotin ingestion may interfere with free T4 tests. If the results are inconsistent with the TSH level, previous test results, or the clinical presentation, then  consider biotin interference. If needed, order repeat testing after stopping biotin. Performed at [facility], [address] [place], [place] [address]   TSH     Status: None   Collection Time: 07/15/22  9:48 AM  Result Value Ref Range   TSH 1.140 0.350 - 4.500 uIU/mL    Comment: Performed by a 3rd Generation assay with a functional sensitivity of <=0.01 uIU/mL. Performed at [facility], [address] [place], [place] [address]   Comp Met (CMET)     Status: Abnormal   Collection Time: 07/15/22  9:48 AM  Result Value Ref Range   Sodium 141 135 - 145 mmol/L   Potassium 4.6 3.5 - 5.1 mmol/L   Chloride 110 98 - 111 mmol/L   CO2 26 22 - 32 mmol/L   Glucose, Bld 98 70 - 99 mg/dL    Comment: Glucose reference range applies only to samples taken after fasting for at least 8 hours.   BUN 28 (H) 8 - 23 mg/dL   Creatinine, Ser 0.91 0.44 - 1.00 mg/dL   Calcium 9.3 8.9 - 10.3 mg/dL   Total Protein 6.3 (L) 6.5 - 8.1 g/dL   Albumin 3.7 3.5 - 5.0 g/dL   AST 21 15 - 41 U/L   ALT 19 0 - 44 U/L   Alkaline Phosphatase 56 38 - 126 U/L   Total Bilirubin 0.9 0.3 - 1.2 mg/dL   GFR, Estimated >60 >60 mL/min    Comment: (NOTE) Calculated using the CKD-EPI Creatinine Equation (2021)    Anion gap 5 5 - 15    Comment: Performed at [facility], [address], [place], [place] [address]  Surgical pathology     Status: None   Collection Time: 07/30/22 11:07  AM  Result Value Ref Range   SURGICAL PATHOLOGY      SURGICAL PATHOLOGY CASE: [id number] PATIENT: [name] Surgical Pathology Report     Specimen Submitted: A. Colon polyp, descending; cbx  Clinical History: Occult blood in stools R19.5. Colon polyps    DIAGNOSIS: A. COLON POLYP, DESCENDING; COLD BIOPSY: - TUBULAR ADENOMA. - NEGATIVE FOR HIGH-GRADE DYSPLASIA AND MALIGNANCY.  GROSS DESCRIPTION: A. Labeled: cbx polyp descending colon Received: Formalin Collection time: 11:07 AM on  07/30/2022 Placed into formalin time: 11:07 AM on 07/30/2022 Tissue fragment(s): 1 Size: 0.5 x 0.3 x 0.2 cm Description: Tan soft tissue fragment Entirely submitted in 1 cassette.  RB 07/30/2022  Final Diagnosis performed by Allena Napoleon, MD.   Electronically signed 07/31/2022 10:14:45AM The electronic signature indicates that the named Attending Pathologist has evaluated the specimen Technical component performed at [facility], [address], [place], [place] [address] Lab: [phone] Dir: Rush Farmer, MD, MMM  Professiona l component performed at [facility], [address] [place], [place], [place] [address] Lab: [phone] Dir: Kathi Simpers, MD   GI Profile, Stool, PCR     Status: None   Collection Time: 08/17/22  7:40 AM  Result Value Ref Range   Campylobacter Not Detected Not Detected   C difficile toxin A/B Not Detected Not Detected   Plesiomonas shigelloides Not Detected Not Detected   Salmonella Not Detected Not Detected   Vibrio Not Detected Not Detected   Vibrio cholerae Not Detected Not Detected   Yersinia enterocolitica Not Detected Not Detected   Enteroaggregative E coli Not Detected Not Detected   Enteropathogenic E coli Not Detected Not Detected   Enterotoxigenic E coli Not Detected Not Detected   Shiga-toxin-producing E coli Not Detected Not Detected   E coli T557 Not applicable Not Detected   Shigella/Enteroinvasive E coli Not Detected Not Detected   Cryptosporidium Not Detected Not Detected   Cyclospora cayetanensis Not Detected Not Detected   Entamoeba histolytica Not Detected Not Detected   Giardia lamblia Not Detected Not Detected   Adenovirus F 40/41 Not Detected Not Detected   Astrovirus Not Detected Not Detected   Norovirus GI/GII Not Detected Not Detected   Rotavirus A Not Detected Not Detected   Sapovirus Not Detected Not Detected  Basic metabolic panel     Status: Abnormal   Collection Time: 08/29/22 10:09 PM  Result Value  Ref Range   Sodium 141 135 - 145 mmol/L   Potassium 3.1 (L) 3.5 - 5.1 mmol/L   Chloride 112 (H) 98 - 111 mmol/L   CO2 23 22 - 32 mmol/L   Glucose, Bld 99 70 - 99 mg/dL    Comment: Glucose reference range applies only to samples taken after fasting for at least 8 hours.   BUN 26 (H) 8 - 23 mg/dL   Creatinine, Ser 0.95 0.44 - 1.00 mg/dL   Calcium 8.2 (L) 8.9 - 10.3 mg/dL   GFR, Estimated 58 (L) >60 mL/min    Comment: (NOTE) Calculated using the CKD-EPI Creatinine Equation (2021)    Anion gap 6 5 - 15    Comment: Performed at [facility], [address] [place], [place] [address]  CBC     Status: Abnormal   Collection Time: 08/29/22 10:09 PM  Result Value Ref Range   WBC 12.8 (H) 4.0 - 10.5 K/uL   RBC 3.57 (L) 3.87 - 5.11 MIL/uL   Hemoglobin 11.2 (L) 12.0 - 15.0 g/dL   HCT 35.2 (L) 36.0 -  46.0 %   MCV 98.6 80.0 - 100.0 fL   MCH 31.4 26.0 - 34.0 pg   MCHC 31.8 30.0 - 36.0 g/dL   RDW 20.2 (H) 11.5 - 15.5 %   Platelets 186 150 - 400 K/uL   nRBC 0.5 (H) 0.0 - 0.2 %    Comment: Performed at [facility], [address], [place], [place] [address]  Troponin I (High Sensitivity)     Status: None   Collection Time: 08/29/22 10:09 PM  Result Value Ref Range   Troponin I (High Sensitivity) 11 <18 ng/L    Comment: (NOTE) Elevated high sensitivity troponin I (hsTnI) values and significant  changes across serial measurements may suggest ACS but many other  chronic and acute conditions are known to elevate hsTnI results.  Refer to the "Links" section for chest pain algorithms and additional  guidance. Performed at [facility], [address] [place], [place] [address]   Magnesium     Status: None   Collection Time: 08/29/22 10:09 PM  Result Value Ref Range   Magnesium 1.8 1.7 - 2.4 mg/dL    Comment: Performed at [facility], [address] [place], [place] [address]  TSH     Status: None   Collection Time: 08/29/22 10:09 PM  Result Value  Ref Range   TSH 0.964 0.350 - 4.500 uIU/mL    Comment: Performed by a 3rd Generation assay with a functional sensitivity of <=0.01 uIU/mL. Performed at [facility], [address] [place], [place] [address]   Brain natriuretic peptide     Status: Abnormal   Collection Time: 08/29/22 10:09 PM  Result Value Ref Range   B Natriuretic Peptide 364.1 (H) 0.0 - 100.0 pg/mL    Comment: Performed at [facility], [place], [place] [address]  Troponin I (High Sensitivity)     Status: Abnormal   Collection Time: 08/30/22 12:18 AM  Result Value Ref Range   Troponin I (High Sensitivity) 18 (H) <18 ng/L    Comment: (NOTE) Elevated high sensitivity troponin I (hsTnI) values and significant  changes across serial measurements may suggest ACS but many other  chronic and acute conditions are known to elevate hsTnI results.  Refer to the "Links" section for chest pain algorithms and additional  guidance. Performed at [facility], [address] [place], [place] [address]   Urinalysis, Routine w reflex microscopic Urine, Clean Catch     Status: Abnormal   Collection Time: 08/30/22  1:20 AM  Result Value Ref Range   Color, Urine YELLOW (A) YELLOW   APPearance CLEAR (A) CLEAR   Specific Gravity, Urine 1.015 1.005 - 1.030   pH 5.0 5.0 - 8.0   Glucose, UA NEGATIVE NEGATIVE mg/dL   Hgb urine dipstick NEGATIVE NEGATIVE   Bilirubin Urine NEGATIVE NEGATIVE   Ketones, ur 5 (A) NEGATIVE mg/dL   Protein, ur 30 (A) NEGATIVE mg/dL   Nitrite NEGATIVE NEGATIVE   Leukocytes,Ua NEGATIVE NEGATIVE   RBC / HPF 0-5 0 - 5 RBC/hpf   WBC, UA 6-10 0 - 5 WBC/hpf   Bacteria, UA NONE SEEN NONE SEEN   Squamous Epithelial / LPF 0-5 0 - 5    Comment: Performed at [facility], [address] [place],  [address]  Urine Culture     Status: None   Collection Time: 08/30/22  1:20 AM   Specimen: Urine, Clean Catch  Result Value Ref Range   Specimen Description  URINE, CLEAN CATCH Performed at [facility], [address] [place], [place] [address]    Special Requests      NONE Performed at [facility], [address] [place], [place] [address]    Culture      NO GROWTH Performed at [facility], [place] [address], [place], [place] [address]    Report Status 08/31/2022 FINAL   Culture, blood (x 2)     Status: None   Collection Time: 08/30/22  5:02 AM   Specimen: BLOOD  Result Value Ref Range   Specimen Description BLOOD BLOOD RIGHT HAND    Special Requests      BOTTLES DRAWN AEROBIC AND ANAEROBIC Blood Culture adequate volume   Culture      NO GROWTH 5 DAYS Performed at [facility], [address] [place], [place] [address]    Report Status 09/04/2022 FINAL   Basic metabolic panel     Status: Abnormal   Collection Time: 08/30/22  5:03 AM  Result Value Ref Range   Sodium 140 135 - 145 mmol/L    Comment: ELECTROLYTES REPEATED TO VERIFY MJU   Potassium 3.9 3.5 - 5.1 mmol/L   Chloride 112 (H) 98 - 111 mmol/L   CO2 26 22 - 32 mmol/L   Glucose, Bld 95 70 - 99 mg/dL    Comment: Glucose reference range applies only to samples taken after fasting for at least 8 hours.   BUN 24 (H) 8 - 23 mg/dL   Creatinine, Ser 0.83 0.44 - 1.00 mg/dL   Calcium 8.5 (L) 8.9 - 10.3 mg/dL   GFR, Estimated >60 >60 mL/min    Comment: (NOTE) Calculated using the CKD-EPI Creatinine Equation (2021)    Anion gap 2 (L) 5 - 15    Comment: Performed at [facility], [address] [place], [place] [address]  CBC     Status: Abnormal   Collection Time: 08/30/22  5:03 AM  Result Value Ref Range   WBC 10.3 4.0 - 10.5 K/uL   RBC 3.09 (L) 3.87 - 5.11 MIL/uL   Hemoglobin 9.8 (L) 12.0 - 15.0 g/dL   HCT 31.3 (L) 36.0 - 46.0 %   MCV 101.3 (H) 80.0 - 100.0 fL   MCH 31.7 26.0 - 34.0 pg   MCHC 31.3 30.0 - 36.0 g/dL   RDW 20.6 (H) 11.5 - 15.5 %   Platelets 159 150 - 400 K/uL   nRBC 0.2 0.0 - 0.2 %    Comment:  Performed at [facility], [address] [place], [place] [address]  Culture, blood (x 2)     Status: None   Collection Time: 08/30/22  5:03 AM   Specimen: BLOOD  Result Value Ref Range   Specimen Description BLOOD BLOOD LEFT WRIST    Special Requests      BOTTLES DRAWN AEROBIC AND ANAEROBIC Blood Culture results may not be optimal due to an excessive volume of blood received in culture bottles   Culture      NO GROWTH 5 DAYS Performed at [facility], [address] [place], [place] [address]    Report Status 09/04/2022 FINAL   Lactic acid, plasma     Status: None   Collection Time: 08/30/22  5:03 AM  Result Value Ref Range   Lactic Acid, Venous 0.8 0.5 - 1.9 mmol/L    Comment: Performed at [facility], [address] [place], [place] [address]  [place]  Status: None   Collection Time: 08/30/22  5:03 AM  Result Value Ref Range   Prothrombin Time 14.8 11.4 - 15.2 seconds   INR 1.2 0.8 - 1.2    Comment: (NOTE) INR goal varies based on device and disease states. Performed at [facility], [address] [place], [place] [address]   APTT     Status: None   Collection Time: 08/30/22  5:03 AM  Result Value Ref Range   aPTT 30 24 - 36 seconds    Comment: Performed at [facility], [address] [place], [place] [address]  CBC     Status: Abnormal   Collection Time: 08/31/22  4:52 AM  Result Value Ref Range   WBC 11.0 (H) 4.0 - 10.5 K/uL   RBC 3.24 (L) 3.87 - 5.11 MIL/uL   Hemoglobin 10.3 (L) 12.0 - 15.0 g/dL   HCT 32.2 (L) 36.0 - 46.0 %   MCV 99.4 80.0 - 100.0 fL   MCH 31.8 26.0 - 34.0 pg   MCHC 32.0 30.0 - 36.0 g/dL   RDW 20.6 (H) 11.5 - 15.5 %   Platelets 155 150 - 400 K/uL   nRBC 0.2 0.0 - 0.2 %    Comment: Performed at [facility], [address] [place], [place] [address]  Basic metabolic panel     Status: Abnormal   Collection Time: 08/31/22  4:52 AM  Result Value Ref Range   Sodium 138 135  - 145 mmol/L   Potassium 4.0 3.5 - 5.1 mmol/L   Chloride 111 98 - 111 mmol/L   CO2 24 22 - 32 mmol/L   Glucose, Bld 89 70 - 99 mg/dL    Comment: Glucose reference range applies only to samples taken after fasting for at least 8 hours.   BUN 21 8 - 23 mg/dL   Creatinine, Ser 0.81 0.44 - 1.00 mg/dL   Calcium 8.2 (L) 8.9 - 10.3 mg/dL   GFR, Estimated >60 >60 mL/min    Comment: (NOTE) Calculated using the CKD-EPI Creatinine Equation (2021)    Anion gap 3 (L) 5 - 15    Comment: Performed at [facility], [address] [place], [place] [address]  Basic metabolic panel     Status: Abnormal   Collection Time: 09/14/22 12:04 PM  Result Value Ref Range   Sodium 140 135 - 145 mEq/L   Potassium 3.6 3.5 - 5.1 mEq/L   Chloride 106 96 - 112 mEq/L   CO2 26 19 - 32 mEq/L   Glucose, Bld 114 (H) 70 - 99 mg/dL   BUN 28 (H) 6 - 23 mg/dL   Creatinine, Ser 1.00 0.40 - 1.20 mg/dL   GFR 50.21 (L) >60.00 mL/min    Comment: Calculated using the CKD-EPI Creatinine Equation (2021)   Calcium 9.0 8.4 - 10.5 mg/dL    Assessment/Plan:  No problem-specific Assessment & Plan notes found for this encounter.      Leotis Pain 09/29/2022, 2:18 PM   This note was created with Dragon medical transcription system.  Any errors from dictation are unintentional.

## 2022-09-30 NOTE — Assessment & Plan Note (Signed)
lipid control important in reducing the progression of atherosclerotic disease.   

## 2022-09-30 NOTE — Assessment & Plan Note (Signed)
Sounds like the primary issue has been cardiac dysfunction, but with a history of venous insufficiency we will assess that with a duplex as well.  I given her a prescription for compression socks and talked about potentially using the zippered compression socks.  She will try to elevate her legs more and remain as active as possible.  Will see her back following her venous reflux study.

## 2022-09-30 NOTE — Patient Instructions (Signed)
Edema ? ?Edema is when you have too much fluid in your body or under your skin. Edema may make your legs, feet, and ankles swell. Swelling often happens in looser tissues, such as around your eyes. This is a common condition. It gets more common as you get older. ?There are many possible causes of edema. These include: ?Eating too much salt (sodium). ?Being on your feet or sitting for a long time. ?Certain medical conditions, such as: ?Pregnancy. ?Heart failure. ?Liver disease. ?Kidney disease. ?Cancer. ?Hot weather may make edema worse. Edema is usually painless. Your skin may look swollen or shiny. ?Follow these instructions at home: ?Medicines ?Take over-the-counter and prescription medicines only as told by your doctor. ?Your doctor may prescribe a medicine to help your body get rid of extra water (diuretic). Take this medicine if you are told to take it. ?Eating and drinking ?Eat a low-salt (low-sodium) diet as told by your doctor. Sometimes, eating less salt may reduce swelling. ?Depending on the cause of your swelling, you may need to limit how much fluid you drink (fluid restriction). ?General instructions ?Raise the injured area above the level of your heart while you are sitting or lying down. ?Do not sit still or stand for a long time. ?Do not wear tight clothes. Do not wear garters on your upper legs. ?Exercise your legs. This can help the swelling go down. ?Wear compression stockings as told by your doctor. It is important that these are the right size. These should be prescribed by your doctor to prevent possible injuries. ?If elastic bandages or wraps are recommended, use them as told by your doctor. ?Contact a doctor if: ?Treatment is not working. ?You have heart, liver, or kidney disease and have symptoms of edema. ?You have sudden and unexplained weight gain. ?Get help right away if: ?You have shortness of breath or chest pain. ?You cannot breathe when you lie down. ?You have pain, redness, or  warmth in the swollen areas. ?You have heart, liver, or kidney disease and get edema all of a sudden. ?You have a fever and your symptoms get worse all of a sudden. ?These symptoms may be an emergency. Get help right away. Call 911. ?Do not wait to see if the symptoms will go away. ?Do not drive yourself to the hospital. ?Summary ?Edema is when you have too much fluid in your body or under your skin. ?Edema may make your legs, feet, and ankles swell. Swelling often happens in looser tissues, such as around your eyes. ?Raise the injured area above the level of your heart while you are sitting or lying down. ?Follow your doctor's instructions about diet and how much fluid you can drink. ?This information is not intended to replace advice given to you by your health care provider. Make sure you discuss any questions you have with your health care provider. ?Document Revised: 05/19/2021 Document Reviewed: 05/19/2021 ?[employer] Patient Education ? 2023 [employer] ? ?

## 2022-09-30 NOTE — Assessment & Plan Note (Signed)
blood pressure control important in reducing the progression of atherosclerotic disease. On appropriate oral medications.  

## 2022-09-30 NOTE — Assessment & Plan Note (Signed)
With her worsening swelling and pain, we will recheck a venous duplex to further evaluate her venous system.  A lot of her swelling is likely due to cardiac issues, but venous insufficiency could be contributing.

## 2022-10-09 ENCOUNTER — Ambulatory Visit (INDEPENDENT_AMBULATORY_CARE_PROVIDER_SITE_OTHER): Payer: Medicare Other

## 2022-10-09 ENCOUNTER — Encounter (INDEPENDENT_AMBULATORY_CARE_PROVIDER_SITE_OTHER): Payer: Self-pay | Admitting: Vascular Surgery

## 2022-10-09 ENCOUNTER — Ambulatory Visit (INDEPENDENT_AMBULATORY_CARE_PROVIDER_SITE_OTHER): Payer: Medicare Other | Admitting: Vascular Surgery

## 2022-10-09 VITALS — BP 131/70 | HR 60 | Ht 65.0 in | Wt 134.0 lb

## 2022-10-09 DIAGNOSIS — R600 Localized edema: Secondary | ICD-10-CM | POA: Diagnosis not present

## 2022-10-09 DIAGNOSIS — E782 Mixed hyperlipidemia: Secondary | ICD-10-CM

## 2022-10-09 DIAGNOSIS — I10 Essential (primary) hypertension: Secondary | ICD-10-CM | POA: Diagnosis not present

## 2022-10-09 DIAGNOSIS — I878 Other specified disorders of veins: Secondary | ICD-10-CM | POA: Diagnosis not present

## 2022-10-09 DIAGNOSIS — I83893 Varicose veins of bilateral lower extremities with other complications: Secondary | ICD-10-CM

## 2022-10-09 NOTE — Assessment & Plan Note (Signed)
Reflux study was done today.  No evidence of DVT or superficial thrombophlebitis was seen in either lower extremity.  No reflux was seen on the right.  There was a segment of the left great saphenous vein with reflux. Given her improvement in symptoms, I do not think proceeding with any intervention at this time is likely much benefit.  I would recommend continuing to wear compression socks and elevate her legs and remain active.  I will see her back in 6 months.

## 2022-10-09 NOTE — Progress Notes (Signed)
MRN : [id number]  Brenda Cardenas is a 89 y.o. (09/06/1932) female who presents with chief complaint of  Chief Complaint  Patient presents with   Follow-up    BI Reflux  .  History of Present Illness: Patient returns today in follow up of leg swelling and venous insufficiency. She is doing well today.  Her swelling is actually improved over her last visit which was only 10 days ago.  No open wounds or infection.  No fevers or chills.  Patient has been wearing her compression socks daily and this seems to be making a significant difference.  She also has more energy in between her leg swelling getting better and her energy being better, but is a good sign that her cardiac function has been improving and stable.  Reflux study was done today.  No evidence of DVT or superficial thrombophlebitis was seen in either lower extremity.  No reflux was seen on the right.  There was a segment of the left great saphenous vein with reflux.  Current Outpatient Medications  Medication Sig Dispense Refill   acetaminophen (TYLENOL) 325 MG tablet Take 650 mg by mouth every 6 (six) hours as needed.     amiodarone (PACERONE) 200 MG tablet Take 1 tablet (200 mg total) by mouth daily. 90 tablet 3   Calcium Citrate-Vitamin D (CITRACAL + D PO) Take 2 capsules by mouth every other day. Alternates with Vitamin D3     Cholecalciferol (VITAMIN D) 50 MCG (2000 UT) CAPS Take 1 capsule by mouth every other day.     clobetasol ointment (TEMOVATE) 1.60 % Apply 1 Application topically 2 (two) times daily. 60 g 1   CRANBERRY PO Take 2 capsules by mouth daily.      estradiol (ESTRACE) 0.1 MG/GM vaginal cream 1 Applicatorful every other day.     ezetimibe (ZETIA) 10 MG tablet Take 1 tablet (10 mg total) by mouth daily. 90 tablet 3   furosemide (LASIX) 20 MG tablet Take 1 tablet (20 mg total) by mouth as needed. Take 1-2 tablets per week as needed for swelling 45 tablet 3   hyoscyamine (ANASPAZ) 0.125 MG TBDP disintergrating tablet  Place 1 tablet (0.125 mg total) under the tongue every 6 (six) hours as needed (as needed for esophageal spasm). 30 tablet 0   metoprolol succinate (TOPROL-XL) 25 MG 24 hr tablet Take 1 tablet (25 mg total) by mouth daily. 30 tablet 11   omeprazole (PRILOSEC) 20 MG capsule TAKE 1 CAPSULE BY MOUTH EVERY DAY 90 capsule 1   Probiotic Product (ALIGN PO) Take 1 tablet by mouth daily.      sertraline (ZOLOFT) 50 MG tablet Take 1 tablet (50 mg total) by mouth daily. 30 tablet 3   No current facility-administered medications for this visit.    Past Medical History:  Diagnosis Date   Arthritis    Basal cell carcinoma 06/04/2010   left upper back   Chronic kidney disease    Colon polyps    Gastritis    GERD (gastroesophageal reflux disease)    Hiatal hernia    Hyperlipidemia    Hypertension    IBS (irritable bowel syndrome)    Low back pain radiating to left leg 03/28/2020   Sciatica, left side 04/15/2020   Squamous cell carcinoma of skin 06/26/2013   left mid pretibial   Squamous cell carcinoma of skin 08/21/2015   lower sternum   Squamous cell carcinoma of skin 10/14/2017   right mid pretibial   Squamous  cell carcinoma of skin 03/17/2018   right prox lateral elbow   Squamous cell carcinoma of skin 09/07/2018   right post thigh   Squamous cell carcinoma of skin 05/11/2019   right mid med pretibial   Squamous cell carcinoma of skin 06/20/2019   right bicep   Supraventricular tachycardia     Past Surgical History:  Procedure Laterality Date   APPENDECTOMY  1959   CARPAL TUNNEL RELEASE     CATARACT EXTRACTION W/ INTRAOCULAR LENS IMPLANT Left    CATARACT EXTRACTION W/PHACO Right 04/20/2018   Procedure: CATARACT EXTRACTION PHACO AND INTRAOCULAR LENS PLACEMENT ([place])  RIGHT;  Surgeon: Leandrew Koyanagi, MD;  Location: [place];  Service: Ophthalmology;  Laterality: Right;   CHOLECYSTECTOMY  1995   COLONOSCOPY WITH PROPOFOL N/A 07/30/2022   Procedure: COLONOSCOPY WITH  PROPOFOL;  Surgeon: Lin Landsman, MD;  Location: [facility] END[facility]COPY;  Service: Gastroenterology;  Laterality: N/A;   EYE SURGERY     GANGLION CYST EXCISION     JOINT REPLACEMENT Right 03/2008   [place]    KNEE ARTHROSCOPY     LITHOTRIPSY     SALIVARY GLAND SURGERY     SKIN CANCER EXCISION     TONSILLECTOMY     TONSILLECTOMY AND ADENOIDECTOMY     VEIN LIGATION       Social History   Tobacco Use   Smoking status: Never   Smokeless tobacco: Never  Vaping Use   Vaping Use: Never used  Substance Use Topics   Alcohol use: No    Alcohol/week: 0.0 standard drinks of alcohol   Drug use: No       Family History  Problem Relation Age of Onset   Heart disease Mother    Arthritis Mother    Diabetes Father      Allergies  Allergen Reactions   Ciprofloxacin Diarrhea   Clindamycin/Lincomycin Diarrhea   Sulfa Antibiotics Rash    Rash in throat   Augmentin [Amoxicillin-Pot Clavulanate] Diarrhea   Gabapentin Other (See Comments)    Irregular heartbeat   Lidocaine     Other reaction(s): Not available   Naproxen Sodium Other (See Comments)    Causes gastritis   Prednisone     Atrial Fibrillation   Tramadol Diarrhea    "upset stomach, headache, dizziness, drowsiness"   Codeine Nausea Only and Rash    Upset stomach   Doxycycline Hyclate Nausea And Vomiting and Nausea Only    Dry heaves.    E-Mycin [Erythromycin] Rash   Macrobid [Nitrofurantoin] Rash    severe   Nabumetone Rash and Other (See Comments)    Upsets liver enzymes   Nitrofurantoin Monohyd Macro Rash   Nsaids Rash    Gastritis, GI ulceration.    Phenobarbital Other (See Comments), Rash and Anxiety    "got wild" Patient becomes very hyper and anxious   Statins Rash    Upsets liver enzymes    REVIEW OF SYSTEMS (Negative unless checked)   Constitutional: '[]'$ Weight loss  '[]'$ Fever  '[]'$ Chills Cardiac: '[]'$ Chest pain   '[]'$ Chest pressure   '[x]'$ Palpitations   '[]'$ Shortness of breath when laying flat   '[]'$ Shortness of  breath at rest   '[x]'$ Shortness of breath with exertion. Vascular:  '[]'$ Pain in legs with walking   '[]'$ Pain in legs at rest   '[]'$ Pain in legs when laying flat   '[]'$ Claudication   '[]'$ Pain in feet when walking  '[]'$ Pain in feet at rest  '[]'$ Pain in feet when laying flat   '[]'$ History of DVT   '[]'$   Phlebitis   '[x]'$ Swelling in legs   '[x]'$ Varicose veins   '[]'$ Non-healing ulcers Pulmonary:   '[]'$ Uses home oxygen   '[]'$ Productive cough   '[]'$ Hemoptysis   '[]'$ Wheeze  '[]'$ COPD   '[]'$ Asthma Neurologic:  '[]'$ Dizziness  '[]'$ Blackouts   '[]'$ Seizures   '[]'$ History of stroke   '[]'$ History of TIA  '[]'$ Aphasia   '[]'$ Temporary blindness   '[]'$ Dysphagia   '[]'$ Weakness or numbness in arms   '[]'$ Weakness or numbness in legs Musculoskeletal:  '[x]'$ Arthritis   '[]'$ Joint swelling   '[x]'$ Joint pain   '[]'$ Low back pain Hematologic:  '[]'$ Easy bruising  '[]'$ Easy bleeding   '[]'$ Hypercoagulable state   '[]'$ Anemic  '[]'$ Hepatitis Gastrointestinal:  '[]'$ Blood in stool   '[]'$ Vomiting blood  '[]'$ Gastroesophageal reflux/heartburn   '[]'$ Abdominal pain Genitourinary:  '[]'$ Chronic kidney disease   '[]'$ Difficult urination  '[]'$ Frequent urination  '[]'$ Burning with urination   '[]'$ Hematuria Skin:  '[]'$ Rashes   '[]'$ Ulcers   '[]'$ Wounds Psychological:  '[]'$ History of anxiety   '[]'$  History of major depression.  Physical Examination  BP 131/70   Pulse 60   Ht '5\' 5"'$  (1.651 m)   Wt 134 lb (60.8 kg)   BMI 22.30 kg/m  Gen:  WD/WN, NAD Head: [place]/AT, No temporalis wasting. Ear/Nose/Throat: Hearing grossly intact, nares w/o erythema or drainage Eyes: Conjunctiva clear. Sclera non-icteric Neck: Supple.  Trachea midline Pulmonary:  Good air movement, no use of accessory muscles.  Cardiac: irregular Vascular:  Vessel Right Left  Radial Palpable Palpable               Musculoskeletal: M/S 5/5 throughout.  No deformity or atrophy. Trace RLE edema, 2+ LLE edema.  Moderate to severe stasis dermatitis changes are present bilaterally Neurologic: Sensation grossly intact in extremities.  Symmetrical.  Speech is fluent.  Psychiatric: Judgment  intact, Mood & affect appropriate for pt's clinical situation. Dermatologic: No rashes or ulcers noted.  No cellulitis or open wounds.      Labs Recent Results (from the past 2160 hour(s))  T4, free     Status: Abnormal   Collection Time: 07/15/22  9:48 AM  Result Value Ref Range   Free T4 1.26 (H) 0.61 - 1.12 ng/dL    Comment: (NOTE) Biotin ingestion may interfere with free T4 tests. If the results are inconsistent with the TSH level, previous test results, or the clinical presentation, then consider biotin interference. If needed, order repeat testing after stopping biotin. Performed at [facility], [address] [place], [place] [address]   TSH     Status: None   Collection Time: 07/15/22  9:48 AM  Result Value Ref Range   TSH 1.140 0.350 - 4.500 uIU/mL    Comment: Performed by a 3rd Generation assay with a functional sensitivity of <=0.01 uIU/mL. Performed at [facility], [address] [place], [place] [address]   Comp Met (CMET)     Status: Abnormal   Collection Time: 07/15/22  9:48 AM  Result Value Ref Range   Sodium 141 135 - 145 mmol/L   Potassium 4.6 3.5 - 5.1 mmol/L   Chloride 110 98 - 111 mmol/L   CO2 26 22 - 32 mmol/L   Glucose, Bld 98 70 - 99 mg/dL    Comment: Glucose reference range applies only to samples taken after fasting for at least 8 hours.   BUN 28 (H) 8 - 23 mg/dL   Creatinine, Ser 0.91 0.44 - 1.00 mg/dL   Calcium 9.3 8.9 - 10.3 mg/dL   Total Protein 6.3 (L) 6.5 - 8.1 g/dL   Albumin  3.7 3.5 - 5.0 g/dL   AST 21 15 - 41 U/L   ALT 19 0 - 44 U/L   Alkaline Phosphatase 56 38 - 126 U/L   Total Bilirubin 0.9 0.3 - 1.2 mg/dL   GFR, Estimated >60 >60 mL/min    Comment: (NOTE) Calculated using the CKD-EPI Creatinine Equation (2021)    Anion gap 5 5 - 15    Comment: Performed at [facility], [address] [place], [place] [address]  Surgical pathology     Status: None   Collection Time: 07/30/22 11:07 AM  Result  Value Ref Range   SURGICAL PATHOLOGY      SURGICAL PATHOLOGY CASE: [phone] PATIENT: [name] Surgical Pathology Report     Specimen Submitted: A. Colon polyp, descending; cbx  Clinical History: Occult blood in stools R19.5. Colon polyps    DIAGNOSIS: A. COLON POLYP, DESCENDING; COLD BIOPSY: - TUBULAR ADENOMA. - NEGATIVE FOR HIGH-GRADE DYSPLASIA AND MALIGNANCY.  GROSS DESCRIPTION: A. Labeled: cbx polyp descending colon Received: Formalin Collection time: 11:07 AM on 07/30/2022 Placed into formalin time: 11:07 AM on 07/30/2022 Tissue fragment(s): 1 Size: 0.5 x 0.3 x 0.2 cm Description: Tan soft tissue fragment Entirely submitted in 1 cassette.  RB 07/30/2022  Final Diagnosis performed by Allena Napoleon, MD.   Electronically signed 07/31/2022 10:14:45AM The electronic signature indicates that the named Attending Pathologist has evaluated the specimen Technical component performed at [facility], [address], [place], [place] [address] Lab: [phone] Dir: Rush Farmer, MD, MMM  Professiona l component performed at [facility], [facility], [address], [place], [place] [address] Lab: [phone] Dir: Kathi Simpers, MD   GI Profile, Stool, PCR     Status: None   Collection Time: 08/17/22  7:40 AM  Result Value Ref Range   Campylobacter Not Detected Not Detected   C difficile toxin A/B Not Detected Not Detected   Plesiomonas shigelloides Not Detected Not Detected   Salmonella Not Detected Not Detected   Vibrio Not Detected Not Detected   Vibrio cholerae Not Detected Not Detected   Yersinia enterocolitica Not Detected Not Detected   Enteroaggregative E coli Not Detected Not Detected   Enteropathogenic E coli Not Detected Not Detected   Enterotoxigenic E coli Not Detected Not Detected   Shiga-toxin-producing E coli Not Detected Not Detected   E coli Z563 Not applicable Not Detected   Shigella/Enteroinvasive E coli Not Detected Not Detected    Cryptosporidium Not Detected Not Detected   Cyclospora cayetanensis Not Detected Not Detected   Entamoeba histolytica Not Detected Not Detected   Giardia lamblia Not Detected Not Detected   Adenovirus F 40/41 Not Detected Not Detected   Astrovirus Not Detected Not Detected   Norovirus GI/GII Not Detected Not Detected   Rotavirus A Not Detected Not Detected   Sapovirus Not Detected Not Detected  Basic metabolic panel     Status: Abnormal   Collection Time: 08/29/22 10:09 PM  Result Value Ref Range   Sodium 141 135 - 145 mmol/L   Potassium 3.1 (L) 3.5 - 5.1 mmol/L   Chloride 112 (H) 98 - 111 mmol/L   CO2 23 22 - 32 mmol/L   Glucose, Bld 99 70 - 99 mg/dL    Comment: Glucose reference range applies only to samples taken after fasting for at least 8 hours.   BUN 26 (H) 8 - 23 mg/dL   Creatinine, Ser 0.95 0.44 - 1.00 mg/dL   Calcium 8.2 (L) 8.9 - 10.3 mg/dL  GFR, Estimated 58 (L) >60 mL/min    Comment: (NOTE) Calculated using the CKD-EPI Creatinine Equation (2021)    Anion gap 6 5 - 15    Comment: Performed at [facility], [address] [place], [place] [address]  CBC     Status: Abnormal   Collection Time: 08/29/22 10:09 PM  Result Value Ref Range   WBC 12.8 (H) 4.0 - 10.5 K/uL   RBC 3.57 (L) 3.87 - 5.11 MIL/uL   Hemoglobin 11.2 (L) 12.0 - 15.0 g/dL   HCT 35.2 (L) 36.0 - 46.0 %   MCV 98.6 80.0 - 100.0 fL   MCH 31.4 26.0 - 34.0 pg   MCHC 31.8 30.0 - 36.0 g/dL   RDW 20.2 (H) 11.5 - 15.5 %   Platelets 186 150 - 400 K/uL   nRBC 0.5 (H) 0.0 - 0.2 %    Comment: Performed at [facility], [address] [place], [place] [address]  Troponin I (High Sensitivity)     Status: None   Collection Time: 08/29/22 10:09 PM  Result Value Ref Range   Troponin I (High Sensitivity) 11 <18 ng/L    Comment: (NOTE) Elevated high sensitivity troponin I (hsTnI) values and significant  changes across serial measurements may suggest ACS but many other  chronic and acute  conditions are known to elevate hsTnI results.  Refer to the "Links" section for chest pain algorithms and additional  guidance. Performed at [facility], [address] [place], [place] [address]   Magnesium     Status: None   Collection Time: 08/29/22 10:09 PM  Result Value Ref Range   Magnesium 1.8 1.7 - 2.4 mg/dL    Comment: Performed at [facility], [address] [place], [place] [address]  TSH     Status: None   Collection Time: 08/29/22 10:09 PM  Result Value Ref Range   TSH 0.964 0.350 - 4.500 uIU/mL    Comment: Performed by a 3rd Generation assay with a functional sensitivity of <=0.01 uIU/mL. Performed at [facility], [address] [place], [place] [address]   Brain natriuretic peptide     Status: Abnormal   Collection Time: 08/29/22 10:09 PM  Result Value Ref Range   B Natriuretic Peptide 364.1 (H) 0.0 - 100.0 pg/mL    Comment: Performed at [facility], [place], [place] [address]  Troponin I (High Sensitivity)     Status: Abnormal   Collection Time: 08/30/22 12:18 AM  Result Value Ref Range   Troponin I (High Sensitivity) 18 (H) <18 ng/L    Comment: (NOTE) Elevated high sensitivity troponin I (hsTnI) values and significant  changes across serial measurements may suggest ACS but many other  chronic and acute conditions are known to elevate hsTnI results.  Refer to the "Links" section for chest pain algorithms and additional  guidance. Performed at [facility], [address] [place], [place] [address]   Urinalysis, Routine w reflex microscopic Urine, Clean Catch     Status: Abnormal   Collection Time: 08/30/22  1:20 AM  Result Value Ref Range   Color, Urine YELLOW (A) YELLOW   APPearance CLEAR (A) CLEAR   Specific Gravity, Urine 1.015 1.005 - 1.030   pH 5.0 5.0 - 8.0   Glucose, UA NEGATIVE NEGATIVE mg/dL   Hgb urine dipstick NEGATIVE NEGATIVE   Bilirubin Urine NEGATIVE NEGATIVE    Ketones, ur 5 (A) NEGATIVE mg/dL   Protein, ur 30 (A)  NEGATIVE mg/dL   Nitrite NEGATIVE NEGATIVE   Leukocytes,Ua NEGATIVE NEGATIVE   RBC / HPF 0-5 0 - 5 RBC/hpf   WBC, UA 6-10 0 - 5 WBC/hpf   Bacteria, UA NONE SEEN NONE SEEN   Squamous Epithelial / HPF 0-5 0 - 5    Comment: Performed at [facility], [address] [place], [place] [address]  Urine Culture     Status: None   Collection Time: 08/30/22  1:20 AM   Specimen: Urine, Clean Catch  Result Value Ref Range   Specimen Description      URINE, CLEAN CATCH Performed at [facility], [address] [place], [place] [address]    Special Requests      NONE Performed at [facility], [address], [place], [place] [address]    Culture      NO GROWTH Performed at [facility], [place] [address], [place], [place] [address]    Report Status 08/31/2022 FINAL   Culture, blood (x 2)     Status: None   Collection Time: 08/30/22  5:02 AM   Specimen: BLOOD  Result Value Ref Range   Specimen Description BLOOD BLOOD RIGHT HAND    Special Requests      BOTTLES DRAWN AEROBIC AND ANAEROBIC Blood Culture adequate volume   Culture      NO GROWTH 5 DAYS Performed at [facility], [address], [place], [place] [address]    Report Status 09/04/2022 FINAL   Basic metabolic panel     Status: Abnormal   Collection Time: 08/30/22  5:03 AM  Result Value Ref Range   Sodium 140 135 - 145 mmol/L    Comment: ELECTROLYTES REPEATED TO VERIFY MJU   Potassium 3.9 3.5 - 5.1 mmol/L   Chloride 112 (H) 98 - 111 mmol/L   CO2 26 22 - 32 mmol/L   Glucose, Bld 95 70 - 99 mg/dL    Comment: Glucose reference range applies only to samples taken after fasting for at least 8 hours.   BUN 24 (H) 8 - 23 mg/dL   Creatinine, Ser 0.83 0.44 - 1.00 mg/dL   Calcium 8.5 (L) 8.9 - 10.3 mg/dL   GFR, Estimated >60 >60 mL/min    Comment: (NOTE) Calculated using the CKD-EPI Creatinine Equation (2021)    Anion gap 2 (L) 5  - 15    Comment: Performed at [facility], [address] [place], [place] [address]  CBC     Status: Abnormal   Collection Time: 08/30/22  5:03 AM  Result Value Ref Range   WBC 10.3 4.0 - 10.5 K/uL   RBC 3.09 (L) 3.87 - 5.11 MIL/uL   Hemoglobin 9.8 (L) 12.0 - 15.0 g/dL   HCT 31.3 (L) 36.0 - 46.0 %   MCV 101.3 (H) 80.0 - 100.0 fL   MCH 31.7 26.0 - 34.0 pg   MCHC 31.3 30.0 - 36.0 g/dL   RDW 20.6 (H) 11.5 - 15.5 %   Platelets 159 150 - 400 K/uL   nRBC 0.2 0.0 - 0.2 %    Comment: Performed at [facility], [address] [place], [place] [address]  Culture, blood (x 2)     Status: None   Collection Time: 08/30/22  5:03 AM   Specimen: BLOOD  Result Value Ref Range   Specimen Description BLOOD BLOOD LEFT WRIST    Special Requests      BOTTLES DRAWN AEROBIC AND ANAEROBIC Blood Culture  results may not be optimal due to an excessive volume of blood received in culture bottles   Culture      NO GROWTH 5 DAYS Performed at [facility], [address] [place], [place] [address]    Report Status 09/04/2022 FINAL   Lactic acid, plasma     Status: None   Collection Time: 08/30/22  5:03 AM  Result Value Ref Range   Lactic Acid, Venous 0.8 0.5 - 1.9 mmol/L    Comment: Performed at [facility], [address] [place], [place] [address]  Protime-INR     Status: None   Collection Time: 08/30/22  5:03 AM  Result Value Ref Range   Prothrombin Time 14.8 11.4 - 15.2 seconds   INR 1.2 0.8 - 1.2    Comment: (NOTE) INR goal varies based on device and disease states. Performed at [facility], [address] [place], [place] [address]   APTT     Status: None   Collection Time: 08/30/22  5:03 AM  Result Value Ref Range   aPTT 30 24 - 36 seconds    Comment: Performed at [facility], [address] [place], [place] [address]  CBC     Status: Abnormal   Collection Time: 08/31/22  4:52 AM  Result Value Ref Range   WBC 11.0 (H)  4.0 - 10.5 K/uL   RBC 3.24 (L) 3.87 - 5.11 MIL/uL   Hemoglobin 10.3 (L) 12.0 - 15.0 g/dL   HCT 32.2 (L) 36.0 - 46.0 %   MCV 99.4 80.0 - 100.0 fL   MCH 31.8 26.0 - 34.0 pg   MCHC 32.0 30.0 - 36.0 g/dL   RDW 20.6 (H) 11.5 - 15.5 %   Platelets 155 150 - 400 K/uL   nRBC 0.2 0.0 - 0.2 %    Comment: Performed at [facility], [address], [place], [place] [address]  Basic metabolic panel     Status: Abnormal   Collection Time: 08/31/22  4:52 AM  Result Value Ref Range   Sodium 138 135 - 145 mmol/L   Potassium 4.0 3.5 - 5.1 mmol/L   Chloride 111 98 - 111 mmol/L   CO2 24 22 - 32 mmol/L   Glucose, Bld 89 70 - 99 mg/dL    Comment: Glucose reference range applies only to samples taken after fasting for at least 8 hours.   BUN 21 8 - 23 mg/dL   Creatinine, Ser 0.81 0.44 - 1.00 mg/dL   Calcium 8.2 (L) 8.9 - 10.3 mg/dL   GFR, Estimated >60 >60 mL/min    Comment: (NOTE) Calculated using the CKD-EPI Creatinine Equation (2021)    Anion gap 3 (L) 5 - 15    Comment: Performed at [facility], [address] [place],  [address]  Basic metabolic panel     Status: Abnormal   Collection Time: 09/14/22 12:04 PM  Result Value Ref Range   Sodium 140 135 - 145 mEq/L   Potassium 3.6 3.5 - 5.1 mEq/L   Chloride 106 96 - 112 mEq/L   CO2 26 19 - 32 mEq/L   Glucose, Bld 114 (H) 70 - 99 mg/dL   BUN 28 (H) 6 - 23 mg/dL   Creatinine, Ser 1.00 0.40 - 1.20 mg/dL   GFR 50.21 (L) >60.00 mL/min    Comment: Calculated using the CKD-EPI Creatinine Equation (2021)   Calcium 9.0 8.4 - 10.5 mg/dL    Radiology No results found.  Assessment/Plan  Essential hypertension blood pressure control important in reducing the progression of atherosclerotic disease. On appropriate oral medications.     Mixed hyperlipidemia lipid control important in reducing the progression of atherosclerotic disease.   Varicose veins of bilateral lower extremities with other complications Reflux study  was done today.  No evidence of DVT or superficial thrombophlebitis was seen in either lower extremity.  No reflux was seen on the right.  There was a segment of the left great saphenous vein with reflux. Given her improvement in symptoms, I do not think proceeding with any intervention at this time is likely much benefit.  I would recommend continuing to wear compression socks and elevate her legs and remain active.  I will see her back in 6 months.  Leg edema Noticeably improved over the past couple of weeks.  Continue compression, elevation, and activity.  Return to clinic in 6 months.    Leotis Pain, MD  10/09/2022 12:00 PM    This note was created with Dragon medical transcription system.  Any errors from dictation are purely unintentional

## 2022-10-09 NOTE — Assessment & Plan Note (Signed)
Noticeably improved over the past couple of weeks.  Continue compression, elevation, and activity.  Return to clinic in 6 months.

## 2022-10-12 ENCOUNTER — Ambulatory Visit (INDEPENDENT_AMBULATORY_CARE_PROVIDER_SITE_OTHER): Payer: Medicare Other | Admitting: Internal Medicine

## 2022-10-12 ENCOUNTER — Encounter: Payer: Self-pay | Admitting: Internal Medicine

## 2022-10-12 VITALS — BP 118/66 | HR 76 | Temp 97.5°F | Ht 65.0 in | Wt 128.4 lb

## 2022-10-12 DIAGNOSIS — I10 Essential (primary) hypertension: Secondary | ICD-10-CM

## 2022-10-12 DIAGNOSIS — F411 Generalized anxiety disorder: Secondary | ICD-10-CM | POA: Diagnosis not present

## 2022-10-12 DIAGNOSIS — D649 Anemia, unspecified: Secondary | ICD-10-CM

## 2022-10-12 DIAGNOSIS — I700 Atherosclerosis of aorta: Secondary | ICD-10-CM

## 2022-10-12 DIAGNOSIS — R600 Localized edema: Secondary | ICD-10-CM

## 2022-10-12 LAB — CBC WITH DIFFERENTIAL/PLATELET
Basophils Absolute: 0.1 10*3/uL (ref 0.0–0.1)
Basophils Relative: 1.3 % (ref 0.0–3.0)
Eosinophils Absolute: 0.2 10*3/uL (ref 0.0–0.7)
Eosinophils Relative: 2.3 % (ref 0.0–5.0)
HCT: 32.8 % — ABNORMAL LOW (ref 36.0–46.0)
Hemoglobin: 11.1 g/dL — ABNORMAL LOW (ref 12.0–15.0)
Lymphocytes Relative: 17.4 % (ref 12.0–46.0)
Lymphs Abs: 1.6 10*3/uL (ref 0.7–4.0)
MCHC: 33.8 g/dL (ref 30.0–36.0)
MCV: 98.3 fl (ref 78.0–100.0)
Monocytes Absolute: 0.5 10*3/uL (ref 0.1–1.0)
Monocytes Relative: 5.4 % (ref 3.0–12.0)
Neutro Abs: 6.9 10*3/uL (ref 1.4–7.7)
Neutrophils Relative %: 73.6 % (ref 43.0–77.0)
Platelets: 161 10*3/uL (ref 150.0–400.0)
RBC: 3.34 Mil/uL — ABNORMAL LOW (ref 3.87–5.11)
RDW: 23 % — ABNORMAL HIGH (ref 11.5–15.5)
WBC: 9.4 10*3/uL (ref 4.0–10.5)

## 2022-10-12 LAB — BASIC METABOLIC PANEL
BUN: 25 mg/dL — ABNORMAL HIGH (ref 6–23)
CO2: 28 mEq/L (ref 19–32)
Calcium: 8.9 mg/dL (ref 8.4–10.5)
Chloride: 104 mEq/L (ref 96–112)
Creatinine, Ser: 1.07 mg/dL (ref 0.40–1.20)
GFR: 46.27 mL/min — ABNORMAL LOW (ref >60.00)
Glucose, Bld: 116 mg/dL — ABNORMAL HIGH (ref 70–99)
Potassium: 3.8 mEq/L (ref 3.5–5.1)
Sodium: 140 mEq/L (ref 135–145)

## 2022-10-12 LAB — B12 AND FOLATE PANEL
Folate: 10.9 ng/mL (ref >5.9)
Vitamin B-12: 896 pg/mL (ref 211–911)

## 2022-10-12 MED ORDER — METOPROLOL SUCCINATE ER 25 MG PO TB24: 25.0000 mg | ORAL_TABLET | Freq: Every day | ORAL | 1 refills | Status: DC

## 2022-10-12 MED ORDER — FUROSEMIDE 20 MG PO TABS: 10.0000 mg | ORAL_TABLET | ORAL | 0 refills | Status: DC

## 2022-10-12 MED ORDER — SERTRALINE HCL 50 MG PO TABS: 50.0000 mg | ORAL_TABLET | Freq: Every day | ORAL | 2 refills | Status: DC

## 2022-10-12 NOTE — Assessment & Plan Note (Signed)
Asymmetric Noticeably improved over the past couple of weeks.  Continue compression, elevation, low dose intermittent furosemide , and increased activity.  Return to clinic in 6 months.

## 2022-10-12 NOTE — Assessment & Plan Note (Signed)
blood pressure control important in reducing the progression of atherosclerotic disease. On appropriate oral medications.  

## 2022-10-12 NOTE — Progress Notes (Signed)
Subjective:  Patient ID: Brenda Cardenas, female    DOB: 1932/07/26  Age: 89 y.o. MRN: [id number]  CC: The primary encounter diagnosis was Essential hypertension. Diagnoses of Anemia, unspecified type, Aortic atherosclerosis ([place]), GAD (generalized anxiety disorder), and Leg edema were also pertinent to this visit.   HPI Brenda Cardenas presents for follow up on initiation of SSRI for management of anxiety  Chief Complaint  Patient presents with   Medical Management of Chronic Issues    1 month follow up on anxiety   Last seen Dec 18,  requested medication for anxiety after family gathering resulted in several family members commenting on her mental status.  A trial of generic zoloft offered and accepted   She returns today with current dose at 50 mg daily , she delayed  the start until after Christmas holiday ,  and advanced dose form 25 to 50 mg  dose last week .  Feeling much better.   2) LE edema:  taking lasix has been problematic due to  patient's intolerance of urinary frequency. She saw DR Lucky Cowboy on Jan 2 and ultrasound was ordered .  Compression stockings updated,  legs look improved,  left leg  is chronically larger than right due to prior interventions.  Able to don her stockings independently using the new stockings form [employer].  Elevating her legs at night and walking daily .  Currently taking 1/2 pill twice a week , on days she stays home. Using a 3 wheeled walker at home with a basket.   Moisturizing legs twice daily   3) paying for a Home Health aide from [facility] to continue coming out to clean [place], make the bed etc.   4) dysuria resolved with use of estrogen cream and clobetasol   Outpatient Medications Prior to Visit  Medication Sig Dispense Refill   acetaminophen (TYLENOL) 325 MG tablet Take 650 mg by mouth every 6 (six) hours as needed.     amiodarone (PACERONE) 200 MG tablet Take 1 tablet (200 mg total) by mouth daily. 90 tablet 3   Calcium Citrate-Vitamin D  (CITRACAL + D PO) Take 2 capsules by mouth every other day. Alternates with Vitamin D3     Cholecalciferol (VITAMIN D) 50 MCG (2000 UT) CAPS Take 1 capsule by mouth every other day.     clobetasol ointment (TEMOVATE) 4.40 % Apply 1 Application topically 2 (two) times daily. 60 g 1   CRANBERRY PO Take 2 capsules by mouth daily.      estradiol (ESTRACE) 0.1 MG/GM vaginal cream 1 Applicatorful every other day.     ezetimibe (ZETIA) 10 MG tablet Take 1 tablet (10 mg total) by mouth daily. 90 tablet 3   hyoscyamine (ANASPAZ) 0.125 MG TBDP disintergrating tablet Place 1 tablet (0.125 mg total) under the tongue every 6 (six) hours as needed (as needed for esophageal spasm). 30 tablet 0   omeprazole (PRILOSEC) 20 MG capsule TAKE 1 CAPSULE BY MOUTH EVERY DAY 90 capsule 1   Probiotic Product (ALIGN PO) Take 1 tablet by mouth daily.      furosemide (LASIX) 20 MG tablet Take 1 tablet (20 mg total) by mouth as needed. Take 1-2 tablets per week as needed for swelling 45 tablet 3   metoprolol succinate (TOPROL-XL) 25 MG 24 hr tablet Take 1 tablet (25 mg total) by mouth daily. 30 tablet 11   sertraline (ZOLOFT) 50 MG tablet Take 1 tablet (50 mg total) by mouth daily. 30 tablet 3  No facility-administered medications prior to visit.    Review of Systems;  Patient denies headache, fevers, malaise, unintentional weight loss, skin rash, eye pain, sinus congestion and sinus pain, sore throat, dysphagia,  hemoptysis , cough, dyspnea, wheezing, chest pain, palpitations, orthopnea, edema, abdominal pain, nausea, melena, diarrhea, constipation, flank pain, dysuria, hematuria, urinary  Frequency, nocturia, numbness, tingling, seizures,  Focal weakness, Loss of consciousness,  Tremor, insomnia, depression, anxiety, and suicidal ideation.      Objective:  BP 118/66   Pulse 76   Temp (!) 97.5 F (36.4 C) (Oral)   Ht '5\' 5"'$  (1.651 m)   Wt 128 lb 6.4 oz (58.2 kg)   SpO2 99%   BMI 21.37 kg/m   BP Readings from Last  3 Encounters:  10/12/22 118/66  10/09/22 131/70  09/29/22 (!) 155/65    Wt Readings from Last 3 Encounters:  10/12/22 128 lb 6.4 oz (58.2 kg)  10/09/22 134 lb (60.8 kg)  09/29/22 134 lb (60.8 kg)    Physical Exam  Lab Results  Component Value Date   HGBA1C 5.4 01/05/2020   HGBA1C 5.5 06/16/2018   HGBA1C 5.7 03/24/2018    Lab Results  Component Value Date   CREATININE 1.07 10/12/2022   CREATININE 1.00 09/14/2022   CREATININE 0.81 08/31/2022    Lab Results  Component Value Date   WBC 9.4 10/12/2022   HGB 11.1 (L) 10/12/2022   HCT 32.8 (L) 10/12/2022   PLT 161.0 10/12/2022   GLUCOSE 116 (H) 10/12/2022   CHOL 150 05/28/2022   TRIG 86.0 05/28/2022   HDL 54.00 05/28/2022   LDLDIRECT 103.0 06/16/2017   LDLCALC 79 05/28/2022   ALT 19 07/15/2022   AST 21 07/15/2022   NA 140 10/12/2022   K 3.8 10/12/2022   CL 104 10/12/2022   CREATININE 1.07 10/12/2022   BUN 25 (H) 10/12/2022   CO2 28 10/12/2022   TSH 0.964 08/29/2022   INR 1.2 08/30/2022   HGBA1C 5.4 01/05/2020   MICROALBUR 1.0 01/05/2020    DG Chest 2 View  Result Date: 08/29/2022 CLINICAL DATA:  Chest pain, tachycardia EXAM: CHEST - 2 VIEW COMPARISON:  05/31/2022 FINDINGS: Moderate-sized hiatal hernia. Heart is normal size. No confluent airspace opacities or effusions. No acute bony abnormality. IMPRESSION: No active cardiopulmonary disease. Moderate-sized hiatal hernia. Electronically Signed   By: Rolm Baptise M.D.   On: 08/29/2022 22:46    Assessment & Plan:  .Essential hypertension Assessment & Plan: blood pressure control important in reducing the progression of atherosclerotic disease. On appropriate oral medications.   Orders: -     Basic metabolic panel  Anemia, unspecified type Assessment & Plan: Secondary to GI losses noted in September 2023  .  Improving,  Iron studies are pending,  b12/folate are normal.   Lab Results  Component Value Date   WBC 9.4 10/12/2022   HGB 11.1 (L) 10/12/2022    HCT 32.8 (L) 10/12/2022   MCV 98.3 10/12/2022   PLT 161.0 10/12/2022     Orders: -     CBC with Differential/Platelet -     Iron, TIBC and Ferritin Panel -     B12 and Folate Panel  Aortic atherosclerosis (HCC) Assessment & Plan: Reviewed prior  Chest CT  with patient. Continue Zetia.  She is statin intolerant due to development of rash and elevated liver enzymes   Lab Results  Component Value Date   CHOL 150 05/28/2022   HDL 54.00 05/28/2022   LDLCALC 79 05/28/2022   LDLDIRECT  103.0 06/16/2017   TRIG 86.0 05/28/2022   CHOLHDL 3 05/28/2022      GAD (generalized anxiety disorder) Assessment & Plan: She reports improved mood and less worrying since starting zoloft and would like to continue the 50 mg dose.  Sleeping well .  No changes today   Leg edema Assessment & Plan: Asymmetric Noticeably improved over the past couple of weeks.  Continue compression, elevation, low dose intermittent furosemide , and increased activity.  Return to clinic in 6 months.   Other orders -     Metoprolol Succinate ER; Take 1 tablet (25 mg total) by mouth daily.  Dispense: 90 tablet; Refill: 1 -     Sertraline HCl; Take 1 tablet (50 mg total) by mouth daily.  Dispense: 90 tablet; Refill: 2 -     Furosemide; Take 0.5 tablets (10 mg total) by mouth 2 (two) times a week. Take 1-2 tablets per week as needed for swelling  Dispense: 30 tablet; Refill: 0     I provided 30 minutes of face-to-face time during this encounter reviewing patient's last visit with me, patient's  most recent visit with cardiology,  vascular surgery,,   recent surgical and non surgical procedures, previous  labs and imaging studies, counseling on currently addressed issues,  and post visit ordering to diagnostics and therapeutics .   Follow-up: Return in about 6 months (around 04/12/2023).   Crecencio Mc, MD

## 2022-10-12 NOTE — Patient Instructions (Signed)
Your legs look much better!  Continue your current medication regimen  I have refilled your metoprolol,  lasix and sertraline fo r90 days   We are rechecking your hemoglobin, iron and potassium today

## 2022-10-12 NOTE — Assessment & Plan Note (Signed)
Reviewed prior  Chest CT  with patient. Continue Zetia.  She is statin intolerant due to development of rash and elevated liver enzymes   Lab Results  Component Value Date   CHOL 150 05/28/2022   HDL 54.00 05/28/2022   LDLCALC 79 05/28/2022   LDLDIRECT 103.0 06/16/2017   TRIG 86.0 05/28/2022   CHOLHDL 3 05/28/2022

## 2022-10-12 NOTE — Assessment & Plan Note (Addendum)
Secondary to GI losses noted in September 2023  .  Improving,  Iron studies are pending,  b12/folate are normal.   Lab Results  Component Value Date   WBC 9.4 10/12/2022   HGB 11.1 (L) 10/12/2022   HCT 32.8 (L) 10/12/2022   MCV 98.3 10/12/2022   PLT 161.0 10/12/2022

## 2022-10-12 NOTE — Assessment & Plan Note (Signed)
She reports improved mood and less worrying since starting zoloft and would like to continue the 50 mg dose.  Sleeping well .  No changes today 

## 2022-10-13 ENCOUNTER — Other Ambulatory Visit: Payer: Self-pay | Admitting: *Deleted

## 2022-10-13 DIAGNOSIS — M810 Age-related osteoporosis without current pathological fracture: Secondary | ICD-10-CM

## 2022-10-13 LAB — VITAMIN D 25 HYDROXY (VIT D DEFICIENCY, FRACTURES): Vit D, 25-Hydroxy: 38 ng/mL (ref 30–100)

## 2022-10-13 LAB — TEST AUTHORIZATION

## 2022-10-13 LAB — IRON,TIBC AND FERRITIN PANEL
%SAT: 28 % (calc) (ref 16–45)
Ferritin: 160 ng/mL (ref 16–288)
Iron: 93 ug/dL (ref 45–160)
TIBC: 330 mcg/dL (calc) (ref 250–450)

## 2022-10-13 NOTE — Addendum Note (Signed)
Addended by: Leeanne Rio on: 10/13/2022 10:54 AM   Modules accepted: Orders

## 2022-10-13 NOTE — Progress Notes (Signed)
Vit D lab added to labs drawn on 10/12/22. Pt is on Prolia and needs it checked prior to next injection this month.

## 2022-10-13 NOTE — Telephone Encounter (Signed)
$  0 due, no PA required

## 2022-10-15 ENCOUNTER — Telehealth: Payer: Self-pay | Admitting: Internal Medicine

## 2022-10-15 NOTE — Telephone Encounter (Signed)
Re-faxed.

## 2022-10-15 NOTE — Telephone Encounter (Signed)
Brenda Cardenas from [facility] called wanting the provider to fax again the drive evaluation for the pt  Fax-[phone]

## 2022-10-19 ENCOUNTER — Ambulatory Visit: Payer: Medicare Other

## 2022-10-27 ENCOUNTER — Ambulatory Visit (INDEPENDENT_AMBULATORY_CARE_PROVIDER_SITE_OTHER): Payer: Medicare Other

## 2022-10-27 DIAGNOSIS — M810 Age-related osteoporosis without current pathological fracture: Secondary | ICD-10-CM

## 2022-10-27 MED ORDER — DENOSUMAB 60 MG/ML ~~LOC~~ SOSY
60.0000 mg | PREFILLED_SYRINGE | Freq: Once | SUBCUTANEOUS | Status: AC
  Administered 2022-10-27: 60 mg via SUBCUTANEOUS

## 2022-10-27 NOTE — Progress Notes (Signed)
Brenda Cardenas presents today for injection per MD orders. Prolia injection administered SQ in right Upper Arm. Administration without incident. Patient tolerated well.  Gwendlyon Zumbro,cma

## 2022-11-09 ENCOUNTER — Emergency Department: Payer: Medicare Other

## 2022-11-09 ENCOUNTER — Other Ambulatory Visit: Payer: Self-pay

## 2022-11-09 ENCOUNTER — Emergency Department
Admission: EM | Admit: 2022-11-09 | Discharge: 2022-11-09 | Disposition: A | Discharge status: Home / Self Care | Payer: Medicare Other | Attending: Emergency Medicine | Admitting: Emergency Medicine

## 2022-11-09 DIAGNOSIS — N189 Chronic kidney disease, unspecified: Secondary | ICD-10-CM | POA: Insufficient documentation

## 2022-11-09 DIAGNOSIS — Z85828 Personal history of other malignant neoplasm of skin: Secondary | ICD-10-CM | POA: Insufficient documentation

## 2022-11-09 DIAGNOSIS — S2241XA Multiple fractures of ribs, right side, initial encounter for closed fracture: Secondary | ICD-10-CM | POA: Diagnosis not present

## 2022-11-09 DIAGNOSIS — Z96659 Presence of unspecified artificial knee joint: Secondary | ICD-10-CM | POA: Diagnosis not present

## 2022-11-09 DIAGNOSIS — S299XXA Unspecified injury of thorax, initial encounter: Secondary | ICD-10-CM | POA: Diagnosis present

## 2022-11-09 DIAGNOSIS — M542 Cervicalgia: Secondary | ICD-10-CM | POA: Insufficient documentation

## 2022-11-09 DIAGNOSIS — R9082 White matter disease, unspecified: Secondary | ICD-10-CM | POA: Insufficient documentation

## 2022-11-09 DIAGNOSIS — I129 Hypertensive chronic kidney disease with stage 1 through stage 4 chronic kidney disease, or unspecified chronic kidney disease: Secondary | ICD-10-CM | POA: Diagnosis not present

## 2022-11-09 DIAGNOSIS — Z8616 Personal history of COVID-19: Secondary | ICD-10-CM | POA: Diagnosis not present

## 2022-11-09 DIAGNOSIS — M546 Pain in thoracic spine: Secondary | ICD-10-CM | POA: Insufficient documentation

## 2022-11-09 DIAGNOSIS — W06XXXA Fall from bed, initial encounter: Secondary | ICD-10-CM | POA: Diagnosis not present

## 2022-11-09 MED ORDER — OXYCODONE-ACETAMINOPHEN 5-325 MG PO TABS
1.0000 | ORAL_TABLET | Freq: Once | ORAL | Status: DC
  Filled 2022-11-09: qty 1

## 2022-11-09 MED ORDER — ACETAMINOPHEN 500 MG PO TABS
1000.0000 mg | ORAL_TABLET | Freq: Once | ORAL | Status: DC
  Filled 2022-11-09: qty 2

## 2022-11-09 NOTE — Discharge Instructions (Signed)
Your CAT scan of your neck showed that you have a subacute fracture of your second vertebrae.  This likely did not happen today.  You also have a fracture of your fourth and fifth right sided ribs from the fall.  Please take Tylenol for pain.  If your pain is not controlled you are developing fevers you are coughing up blood or you feeling short of breath please return to the emergency department.  If you develop numbness weakness in your arms or legs please also return to the emergency department.

## 2022-11-09 NOTE — ED Notes (Signed)
Pt remains away at imaging. Pt's visitor is at bedside of [facility].

## 2022-11-09 NOTE — ED Notes (Addendum)
To bedside with tylenol; pt's visitor states pt left about 64mns ago for imaging. Pt's visitor given diet gingerale as requested.

## 2022-11-09 NOTE — ED Notes (Signed)
E signature pad not working. Pt educated on discharge instructions and verbalized understanding.  

## 2022-11-09 NOTE — ED Notes (Signed)
Pt assisted to toilet and back to bed. MD clearing the c-collar at this time.

## 2022-11-09 NOTE — ED Triage Notes (Signed)
Tripped and stumbled out of bed last night, fell backwards, hitting table with head, table fell backward.  No LOC.  Not on blood thinners.  C/O neck, back, rib pain.  Seen through [facility] had some imaging done.  [place] called patient back stating that they were unable to rule out C1-4 fractures.  Hard c-collar on.  VS wnl.

## 2022-11-09 NOTE — ED Provider Notes (Signed)
[facility] Provider Note    Event Date/Time   First MD Initiated Contact with Patient 11/09/22 1920     (approximate)   History   Neck Pain   HPI  Briell Revolorio is a 89 y.o. female  with pmh HTN, HLD who presents after a fall.  Last night patient got out of bed to use the bathroom when her legs gave out and she fell backward.  Did not hurt her head.  Did not lose consciousness.  She was able to get up and went back to bed.  She is having some upper back and neck pain intracranial to clinic.  Patient tells me they did x-rays of her spine called her back and said she should come to the ER as they could not rule out a fracture.  Patient denies any upper or lower extremity paresthesias or weakness.  No bowel bladder symptoms.  Does have some pain in the upper back denies dyspnea.  Denies headache nausea vomiting.  She is not on blood thinners.    Past Medical History:  Diagnosis Date   Arthritis    Basal cell carcinoma 06/04/2010   left upper back   Chronic kidney disease    Colon polyps    Gastritis    GERD (gastroesophageal reflux disease)    Hiatal hernia    Hyperlipidemia    Hypertension    IBS (irritable bowel syndrome)    Low back pain radiating to left leg 03/28/2020   Sciatica, left side 04/15/2020   Squamous cell carcinoma of skin 06/26/2013   left mid pretibial   Squamous cell carcinoma of skin 08/21/2015   lower sternum   Squamous cell carcinoma of skin 10/14/2017   right mid pretibial   Squamous cell carcinoma of skin 03/17/2018   right prox lateral elbow   Squamous cell carcinoma of skin 09/07/2018   right post thigh   Squamous cell carcinoma of skin 05/11/2019   right mid med pretibial   Squamous cell carcinoma of skin 06/20/2019   right bicep   Supraventricular tachycardia     Patient Active Problem List   Diagnosis Date Noted   Hospital discharge follow-up 09/14/2022   GERD without esophagitis 08/30/2022   Hypokalemia  08/30/2022   Dyslipidemia 08/30/2022   Leg edema 08/30/2022   Polyp of descending colon 07/30/2022   COVID-19 07/02/2022   Anemia 05/31/2022   Palpitations 05/31/2022   Tachycardia 05/25/2022   Traumatic ulcer, limited to breakdown of skin ([place]) [id number]   Atrophic lichen planus [id number]   SVT (supraventricular tachycardia) 08/13/2021   Supraventricular tachycardia 08/13/2021   GAD (generalized anxiety disorder) 11/05/2020   Left ventricular diastolic dysfunction [id number]   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   Aortic atherosclerosis ([place]) 07/08/2020   Primary osteoarthritis of left knee 04/21/2020   Sciatica, left side 04/15/2020   BPPV (benign paroxysmal positional vertigo) 03/14/2019   Educated about COVID-19 virus infection 03/14/2019   Mixed hyperlipidemia 06/20/2018   Varicose veins of bilateral lower extremities with other complications [id number]   Constipation 07/14/2016   Hypovitaminosis D 06/23/2016   Venous stasis of both lower extremities 06/23/2016   Essential hypertension 12/24/2015   History of colonic polyps 12/24/2015   Spinal stenosis of lumbar region 08/13/2015   Idiopathic scoliosis 06/24/2015   Rhinitis due food 12/17/2014   Osteoarthritis of cervical spine 02/25/2014   Osteoporosis 06/18/2013   Encounter for Medicare annual wellness exam 06/18/2013   Hiatal hernia 06/16/2013   Diaphragmatic hernia  06/16/2013   GERD (gastroesophageal reflux disease) 05/02/2013   S/P total knee replacement 05/02/2013   Artificial knee joint present 05/02/2013   History of urinary stone 11/03/2012     Physical Exam  Triage Vital Signs: ED Triage Vitals [11/09/22 2028]  Enc Vitals Group     BP (!) 157/64     Pulse Rate (!) 57     Resp 18     Temp 97.6 F (36.4 C)     Temp Source Oral     SpO2 98 %     Weight      Height      Head Circumference      Peak Flow      Pain Score      Pain Loc      Pain Edu?      Excl. in [place]?     Most  recent vital signs: Vitals:   11/09/22 2030 11/09/22 2100  BP: (!) 153/73 (!) 159/65  Pulse: (!) 56 (!) 55  Resp: 16 19  Temp:    SpO2: 96% 98%     General: Awake, no distress.  CV:  Good peripheral perfusion.  Resp:  Normal effort. Equal breath sounds Abd:  No distention. soft Neuro:             Awake, Alert, Oriented x 3  Other:  Patient has mild tenderness in the midline C-spine, more tenderness along the right paraspinal musculature in the cervical spine region, able to rotate 45 degrees right and left Ecchymosis over the right scapula, there is tender to palpation, tenderness to palpation of the thoracic midline No signs of trauma to the head or face Anterior chest wall is nontender Pelvis is stable nontender Mass or deformity bilateral upper and lower extremities  ED Results / Procedures / Treatments  Labs (all labs ordered are listed, but only abnormal results are displayed) Labs Reviewed - No data to display   EKG     RADIOLOGY I reviewed and interpreted the CT scan of the brain which does not show any acute intracranial process    PROCEDURES:  Critical Care performed: No  Procedures   MEDICATIONS ORDERED IN ED: Medications  acetaminophen (TYLENOL) tablet 1,000 mg (1,000 mg Oral Patient Refused/Not Given 11/09/22 2239)     IMPRESSION / MDM / [place] / ED COURSE  I reviewed the triage vital signs and the nursing notes.                              Patient's presentation is most consistent with acute presentation with potential threat to life or bodily function.  Differential diagnosis includes, but is not limited to, intracranial hemorrhage, cervical spine fracture, cervical spine ligamentous injury, rib fracture, scapular fracture, pneumothorax, thoracic spine fracture  Patient is a 89 year old female presents after fall.  Patient was getting out of bed and fell backward.  Thinks is because she lost her balance and her legs gave out  which she says is not atypical for her.  Typically walks with a walker.  She did not lose consciousness.  She was able to get up and go back to sleep.  Went to urgent care at [facility] clinic today and they apparently did x-rays of I am not able to see imaging.  They called her back told her she should come to the emergency department.  Patient arrives with c-collar in place.  I remove the collar she  does have some mild midline tenderness but she is primarily tender along the right paraspinal musculature, she is able to range her neck.  She has equal strength and sensation in her upper extremities.  Her main sign of trauma is ecchymosis in the right scapular region and she has midline tenderness in the thoracic region no midline lumbar tenderness.  Has no anterior chest wall or abdominal tenderness.  She is alert and oriented not on blood thinners.  CT head and C-spine were obtained from triage..  CT of the C-spine shows a subacute appearing type I dens fracture that is partially resorbed.  I asked the patient about prior trauma she says she has had 3 falls in the last 6 months.  She has chronic pain in the left side of her neck which she says is due to disc issues but denies any new or increasing pain after any of those falls.  Given this is a subacute fracture do not feel that she needs collar at this time.  I reexamined her neck and she has no midline tenderness currently it is all paraspinal.  I will take her c-collar off.  Plan to obtain a CT of the thoracic spine and CT of the chest.   CT of the thoracic spine does not show any acute injury.  She does have old left-sided rib fractures.  Additionally she has acute anterior fourth and fifth rib fractures.  Of note I had ordered patient Percocet but she declined any opiates.  Ordered her a gram of Tylenol but she said this was too high of a dose of patient did not actually receive any pain medication in the ED.  Like this her pain was well-controlled she was  able to ambulate to the bathroom she is satting 100% on my evaluation.  Did discuss with her possibility of admission for pain control but she does not feel that this is necessary and I agree.  Discussed the importance of taking deep breaths and discussed return to ED for any developing paresthesias, shortness of breath, fever hemoptysis.      FINAL CLINICAL IMPRESSION(S) / ED DIAGNOSES   Final diagnoses:  Closed fracture of multiple ribs of right side, initial encounter     Rx / DC Orders   ED Discharge Orders     None        Note:  This document was prepared using Dragon voice recognition software and may include unintentional dictation errors.   Rada Hay, MD 11/09/22 [phone]

## 2022-11-16 ENCOUNTER — Ambulatory Visit (INDEPENDENT_AMBULATORY_CARE_PROVIDER_SITE_OTHER): Payer: Medicare Other | Admitting: Internal Medicine

## 2022-11-16 ENCOUNTER — Encounter: Payer: Self-pay | Admitting: Internal Medicine

## 2022-11-16 VITALS — BP 144/66 | HR 57 | Temp 97.6°F | Ht 65.0 in | Wt 126.0 lb

## 2022-11-16 DIAGNOSIS — S2231XD Fracture of one rib, right side, subsequent encounter for fracture with routine healing: Secondary | ICD-10-CM

## 2022-11-16 DIAGNOSIS — F411 Generalized anxiety disorder: Secondary | ICD-10-CM | POA: Diagnosis not present

## 2022-11-16 DIAGNOSIS — Z8781 Personal history of (healed) traumatic fracture: Secondary | ICD-10-CM | POA: Diagnosis not present

## 2022-11-16 DIAGNOSIS — S12121D Other nondisplaced dens fracture, subsequent encounter for fracture with routine healing: Secondary | ICD-10-CM

## 2022-11-16 DIAGNOSIS — S2231XA Fracture of one rib, right side, initial encounter for closed fracture: Secondary | ICD-10-CM

## 2022-11-16 DIAGNOSIS — S2239XA Fracture of one rib, unspecified side, initial encounter for closed fracture: Secondary | ICD-10-CM | POA: Insufficient documentation

## 2022-11-16 DIAGNOSIS — S12121S Other nondisplaced dens fracture, sequela: Secondary | ICD-10-CM | POA: Insufficient documentation

## 2022-11-16 MED ORDER — EZETIMIBE 10 MG PO TABS: 10.0000 mg | ORAL_TABLET | Freq: Every day | ORAL | 3 refills | Status: DC

## 2022-11-16 MED ORDER — OMEPRAZOLE 20 MG PO CPDR: DELAYED_RELEASE_CAPSULE | ORAL | 3 refills | Status: DC

## 2022-11-16 NOTE — Assessment & Plan Note (Signed)
Subacute , stable
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
## 2022-11-16 NOTE — Patient Instructions (Addendum)
Annual Wellness visit is due. Please schedule this appointment today at checkout.  You only broke one rib.  All of the other fractured ribs are OLD  The dens fracture is NOT NEW.  And likely happened a few months or weeks ago during one of your previous falls   Please consider getting  MedicAlert watch or pendant to wear 24/7.  YOur phone will not help you if you fall on your hip and can't reach the phone

## 2022-11-16 NOTE — Assessment & Plan Note (Signed)
Right anterior rib fracture .  She has mild pain  .  Reviewed details of fall, and encouraged her to wear a medic alert

## 2022-11-16 NOTE — Assessment & Plan Note (Signed)
She reports improved mood and less worrying since starting zoloft and would like to continue the 50 mg dose.  Sleeping well .  No changes today

## 2022-12-01 NOTE — Progress Notes (Unsigned)
  Electrophysiology Office Follow up Visit Note:    Date:  12/02/2022   ID:  Brenda Cardenas, DOB 03-Oct-1931, MRN [id number]  PCP:  Crecencio Mc, MD  [facility] Cardiologist:  Nelva Bush, MD  [facility] Electrophysiologist:  Vickie Epley, MD    Interval History:    Brenda Cardenas is a 89 y.o. female who presents for a follow up visit.   I last saw her 08/26/2022 for SVT. She takes amiodarone.   She is doing well today.  She recent was hospitalized after a fall.  This concern initially about a C1/C2 fracture but she is now out of his c-collar and doing well.  She is back at home.  She had a shower rod fall on her right arm and gave her a skin tear.  It is healing okay.  She continues to take amiodarone and is tolerating the medication well.  It is doing a good job controlling her abnormal heart rhythms.      Past medical, surgical, social and family history were reviewed.  ROS:   Please see the history of present illness.    All other systems reviewed and are negative.  EKGs/Labs/Other Studies Reviewed:    The following studies were reviewed today:     Physical Exam:    VS:  BP (!) 142/70   Pulse (!) 55   Ht '5\' 5"'$  (1.651 m)   Wt 128 lb 6.4 oz (58.2 kg)   SpO2 99%   BMI 21.37 kg/m     Wt Readings from Last 3 Encounters:  12/02/22 128 lb 6.4 oz (58.2 kg)  11/16/22 126 lb (57.2 kg)  10/12/22 128 lb 6.4 oz (58.2 kg)     GEN:  Well nourished, well developed in no acute distress. Elderly. CARDIAC: RRR, no murmurs, rubs, gallops RESPIRATORY:  Clear to auscultation without rales, wheezing or rhonchi       ASSESSMENT:    1. SVT (supraventricular tachycardia)   2. Encounter for long-term (current) use of high-risk medication   3. Primary hypertension    PLAN:    In order of problems listed above:  #SVT #High risk med monitoring - amiodarone Continue amiodarone Labwork in December with OK liver/thyroid function. Follow up 4 months with APP -  CMP, TSH and FT4 at that visit.  #Hypertension Slightly above goal today but I would not recommend up titration of her antihypertensives given concerns of gait stability.  Recommend checking blood pressures 1-2 times per week at home and recording the values.  Recommend bringing these recordings to the primary care physician.   Follow up 4 months with APP. Blood work at that appointment.     Signed, Lars Mage, MD, [facility], [facility] 12/02/2022 9:04 AM    Electrophysiology [facility]

## 2022-12-02 ENCOUNTER — Encounter: Payer: Self-pay | Admitting: Cardiology

## 2022-12-02 ENCOUNTER — Ambulatory Visit: Payer: Medicare Other | Attending: Cardiology | Admitting: Cardiology

## 2022-12-02 VITALS — BP 142/70 | HR 55 | Ht 65.0 in | Wt 128.4 lb

## 2022-12-02 DIAGNOSIS — Z79899 Other long term (current) drug therapy: Secondary | ICD-10-CM | POA: Diagnosis not present

## 2022-12-02 DIAGNOSIS — I10 Essential (primary) hypertension: Secondary | ICD-10-CM | POA: Diagnosis present

## 2022-12-02 DIAGNOSIS — I471 Supraventricular tachycardia, unspecified: Secondary | ICD-10-CM | POA: Diagnosis present

## 2022-12-02 NOTE — Patient Instructions (Signed)
Medication Instructions:  Your physician recommends that you continue on your current medications as directed. Please refer to the Current Medication list given to you today.  *If you need a refill on your cardiac medications before your next appointment, please call your pharmacy*   Lab Work: CMET, TSH, T4 - Prior to your next appointment you will get your lab work at [facility]) hospital. Your lab work will be done at the [facility] next to [employer].  These are walk in labs- you will not need an appointment and you do not need to be fasting.    Follow-Up: At [facility], you and your health needs are our priority.  As part of our continuing mission to provide you with exceptional heart care, we have created designated Provider Care Teams.  These Care Teams include your primary Cardiologist (physician) and Advanced Practice Providers (APPs -  Physician Assistants and Nurse Practitioners) who all work together to provide you with the care you need, when you need it.  Your next appointment:   4 month(s)  Provider:   You will see one of the following Advanced Practice Providers on your designated Care Team:   Tommye Standard, [place]" [place], [place], NP

## 2022-12-06 ENCOUNTER — Encounter: Payer: Self-pay | Admitting: Internal Medicine

## 2022-12-24 ENCOUNTER — Encounter: Payer: Self-pay | Admitting: Internal Medicine

## 2022-12-29 NOTE — Telephone Encounter (Signed)
Patient called and Dr Derrel Nip was booked for the week. Patient did not want to see any one else. She is going to call [facility].

## 2023-01-04 ENCOUNTER — Other Ambulatory Visit: Payer: Self-pay

## 2023-01-04 ENCOUNTER — Telehealth: Payer: Self-pay

## 2023-01-04 ENCOUNTER — Telehealth: Payer: Self-pay | Admitting: Internal Medicine

## 2023-01-04 DIAGNOSIS — R300 Dysuria: Secondary | ICD-10-CM

## 2023-01-04 NOTE — Telephone Encounter (Signed)
Patient thinks she has a UTI, She refused an appointment. She is requesting a lab appointment, only. She would like to come and give a sample tomorrow.

## 2023-01-04 NOTE — Telephone Encounter (Signed)
Is this okay?

## 2023-01-04 NOTE — Telephone Encounter (Signed)
Spoke with pt to let her know that we have faxed over the orders and scheduled her a telephone visit with Dr. Darrick Huntsman on Thursday.

## 2023-01-04 NOTE — Telephone Encounter (Signed)
Patient states she believes she has a UTI.  Patient states she was incontinent with burning while urinating this weekend.  Patient states she lives at [facility] and the nurse there states if Dr. Duncan Dull sends the order for her to test her urine, then they can get the sample there and send the results to Dr. Darrick Huntsman.  Jayne states they have done this before with Dr. Darrick Huntsman and the fax number is [phone] for lab orders.  Patient states they can't take her urine sample until we send the order.

## 2023-01-07 ENCOUNTER — Encounter: Payer: Self-pay | Admitting: Internal Medicine

## 2023-01-07 ENCOUNTER — Ambulatory Visit (INDEPENDENT_AMBULATORY_CARE_PROVIDER_SITE_OTHER): Payer: Medicare Other | Admitting: Internal Medicine

## 2023-01-07 VITALS — Ht 65.0 in | Wt 128.0 lb

## 2023-01-07 DIAGNOSIS — E782 Mixed hyperlipidemia: Secondary | ICD-10-CM

## 2023-01-07 DIAGNOSIS — E876 Hypokalemia: Secondary | ICD-10-CM

## 2023-01-07 DIAGNOSIS — R300 Dysuria: Secondary | ICD-10-CM | POA: Insufficient documentation

## 2023-01-07 DIAGNOSIS — R002 Palpitations: Secondary | ICD-10-CM

## 2023-01-07 DIAGNOSIS — N3000 Acute cystitis without hematuria: Secondary | ICD-10-CM | POA: Diagnosis not present

## 2023-01-07 MED ORDER — FOSFOMYCIN TROMETHAMINE 3 G PO PACK: 3.0000 g | PACK | Freq: Once | ORAL | 0 refills | Status: AC

## 2023-01-07 NOTE — Progress Notes (Signed)
Telephone Visit Note   This format is felt to be most appropriate for this patient at this time.  All issues noted in this document were discussed and addressed.  No physical exam was performed (except for noted visual exam findings with Video Visits).   I attempted to connect  with [name] on 01/07/23 at  1:00 PM EDT by a video enabled telemedicine application  and verified that I am speaking with the correct person using two identifiers. Location patient: home Location provider: work or home office Persons participating in the virtual visit: patient, provider  I discussed the limitations, risks, security and privacy concerns of performing an evaluation and management service by telephone and the availability of in person appointments. I also discussed with the patient that there may be a patient responsible charge related to this service. The patient expressed understanding and agreed to proceed.  Interactive audio and video telecommunications were attempted between this provider and patient, however failed, due to patient having technical difficulties .  We continued and completed visit with audio only.   Reason for visit: urinary incontinence and discomfort   HPI:  89 yr old female with history of lichen planus  managed  with clobetasol and estrogen cream  every other night causing chronic end void dysuria  urinary incontinence that started one week ago accompanied by dysuria.  No fevers or flank pain,   nausea   UA and culture was done at [facility]. Noting pyuria but negative culture   ROS: See pertinent positives and negatives per HPI.  Past Medical History:  Diagnosis Date   Arthritis    Basal cell carcinoma 06/04/2010   left upper back   Chronic kidney disease    Colon polyps    Gastritis    GERD (gastroesophageal reflux disease)    Hiatal hernia    Hyperlipidemia    Hypertension    IBS (irritable bowel syndrome)    Low back pain radiating to left leg 03/28/2020   Sciatica,  left side 04/15/2020   Squamous cell carcinoma of skin 06/26/2013   left mid pretibial   Squamous cell carcinoma of skin 08/21/2015   lower sternum   Squamous cell carcinoma of skin 10/14/2017   right mid pretibial   Squamous cell carcinoma of skin 03/17/2018   right prox lateral elbow   Squamous cell carcinoma of skin 09/07/2018   right post thigh   Squamous cell carcinoma of skin 05/11/2019   right mid med pretibial   Squamous cell carcinoma of skin 06/20/2019   right bicep   Supraventricular tachycardia     Past Surgical History:  Procedure Laterality Date   APPENDECTOMY  1959   CARPAL TUNNEL RELEASE     CATARACT EXTRACTION W/ INTRAOCULAR LENS IMPLANT Left    CATARACT EXTRACTION W/PHACO Right 04/20/2018   Procedure: CATARACT EXTRACTION PHACO AND INTRAOCULAR LENS PLACEMENT (IOC)  RIGHT;  Surgeon: Lockie Mola, MD;  Location: [facility];  Service: Ophthalmology;  Laterality: Right;   CHOLECYSTECTOMY  1995   COLONOSCOPY WITH PROPOFOL N/A 07/30/2022   Procedure: COLONOSCOPY WITH PROPOFOL;  Surgeon: Toney Reil, MD;  Location: [facility];  Service: Gastroenterology;  Laterality: N/A;   EYE SURGERY     GANGLION CYST EXCISION     JOINT REPLACEMENT Right 03/2008   [facility]    KNEE ARTHROSCOPY     LITHOTRIPSY     SALIVARY GLAND SURGERY     SKIN CANCER EXCISION     TONSILLECTOMY     TONSILLECTOMY  AND ADENOIDECTOMY     VEIN LIGATION      Family History  Problem Relation Age of Onset   Heart disease Mother    Arthritis Mother    Diabetes Father     SOCIAL HX:  reports that she has never smoked. She has never used smokeless tobacco. She reports that she does not drink alcohol and does not use drugs.    Current Outpatient Medications:    acetaminophen (TYLENOL) 325 MG tablet, Take 650 mg by mouth every 6 (six) hours as needed., Disp: , Rfl:    amiodarone (PACERONE) 200 MG tablet, Take 1 tablet (200 mg total) by mouth daily., Disp: 90 tablet, Rfl:  3   Calcium Citrate-Vitamin D (CITRACAL + D PO), Take 2 capsules by mouth every other day. Alternates with Vitamin D3, Disp: , Rfl:    Cholecalciferol (VITAMIN D) 50 MCG (2000 UT) CAPS, Take 1 capsule by mouth every other day., Disp: , Rfl:    clobetasol ointment (TEMOVATE) 0.05 %, Apply 1 Application topically 2 (two) times daily., Disp: 60 g, Rfl: 1   CRANBERRY PO, Take 2 capsules by mouth daily. , Disp: , Rfl:    estradiol (ESTRACE) 0.1 MG/GM vaginal cream, 1 Applicatorful every other day., Disp: , Rfl:    ezetimibe (ZETIA) 10 MG tablet, Take 1 tablet (10 mg total) by mouth daily., Disp: 90 tablet, Rfl: 3   furosemide (LASIX) 20 MG tablet, Take 0.5 tablets (10 mg total) by mouth 2 (two) times a week. Take 1-2 tablets per week as needed for swelling, Disp: 30 tablet, Rfl: 0   hyoscyamine (ANASPAZ) 0.125 MG TBDP disintergrating tablet, Place 1 tablet (0.125 mg total) under the tongue every 6 (six) hours as needed (as needed for esophageal spasm)., Disp: 30 tablet, Rfl: 0   metoprolol succinate (TOPROL-XL) 25 MG 24 hr tablet, Take 1 tablet (25 mg total) by mouth daily., Disp: 90 tablet, Rfl: 1   omeprazole (PRILOSEC) 20 MG capsule, TAKE 1 CAPSULE BY MOUTH EVERY DAY, Disp: 90 capsule, Rfl: 3   Probiotic Product (ALIGN PO), Take 1 tablet by mouth daily. , Disp: , Rfl:    sertraline (ZOLOFT) 50 MG tablet, Take 1 tablet (50 mg total) by mouth daily., Disp: 90 tablet, Rfl: 2  EXAM:  VITALS per patient if applicable:  General appearance: alert, cooperative and articulate.  No signs of being in distress  Lungs: not short of breath ,  No cough, speaking in full sentences  Psych: affect normal,dspeech is articulate and non pressured .  Denies suicidal thoughts     ASSESSMENT AND PLAN: Acute cystitis without hematuria -     Fosfomycin Tromethamine; Take 3 g by mouth once for 1 dose.  Dispense: 3 g; Refill: 0 -     Urinalysis, Routine w reflex microscopic; Future -     Urine Culture;  Future  Dysuria Assessment & Plan: Accompanied by new onset urinary incontinence and pyuria with negative culture.  Empiric treatment with fosfomycin. Continue treatment for lichen planus with estrace and clobetasol  pelvic exam needd    Hypokalemia -     Comprehensive metabolic panel; Future  Mixed hyperlipidemia  Palpitations -     CBC with Differential/Platelet; Future -     TSH; Future      I discussed the assessment and treatment plan with the patient. The patient was provided an opportunity to ask questions and all were answered. The patient agreed with the plan and demonstrated an understanding of the instructions.  The patient was advised to call back or seek an in-person evaluation if the symptoms worsen or if the condition fails to improve as anticipated.   I spent 22  minutes dedicated to the care of this patient on the date of this telephone encounter to include pre-visit review of his medical history,  non  Face-to-face time with the patient , and post visit ordering of testing and therapeutics.    Sherlene Shamseresa L Khailee Mick, MD

## 2023-01-07 NOTE — Telephone Encounter (Signed)
Pt called in staying that she would like for Dr.Tullo to go over her urine culture results the one that were faxed over this morning from [facility] before her appt today with her.

## 2023-01-07 NOTE — Assessment & Plan Note (Addendum)
Accompanied by new onset urinary incontinence and pyuria with negative culture.  Empiric treatment with fosfomycin. Continue treatment for lichen planus with estrace and clobetasol  pelvic exam needd

## 2023-01-17 ENCOUNTER — Encounter: Payer: Self-pay | Admitting: Internal Medicine

## 2023-01-18 ENCOUNTER — Other Ambulatory Visit (INDEPENDENT_AMBULATORY_CARE_PROVIDER_SITE_OTHER): Payer: Medicare Other

## 2023-01-18 DIAGNOSIS — R002 Palpitations: Secondary | ICD-10-CM | POA: Diagnosis not present

## 2023-01-18 DIAGNOSIS — N3000 Acute cystitis without hematuria: Secondary | ICD-10-CM | POA: Diagnosis not present

## 2023-01-18 DIAGNOSIS — E876 Hypokalemia: Secondary | ICD-10-CM | POA: Diagnosis not present

## 2023-01-18 LAB — CBC WITH DIFFERENTIAL/PLATELET
Basophils Absolute: 0.1 10*3/uL (ref 0.0–0.1)
Basophils Relative: 1.1 % (ref 0.0–3.0)
Eosinophils Absolute: 0.3 10*3/uL (ref 0.0–0.7)
Eosinophils Relative: 3 % (ref 0.0–5.0)
HCT: 37.4 % (ref 36.0–46.0)
Hemoglobin: 12.1 g/dL (ref 12.0–15.0)
Lymphocytes Relative: 18.8 % (ref 12.0–46.0)
Lymphs Abs: 2.1 10*3/uL (ref 0.7–4.0)
MCHC: 32.4 g/dL (ref 30.0–36.0)
MCV: 99.1 fl (ref 78.0–100.0)
Monocytes Absolute: 0.6 10*3/uL (ref 0.1–1.0)
Monocytes Relative: 5.3 % (ref 3.0–12.0)
Neutro Abs: 8.2 10*3/uL — ABNORMAL HIGH (ref 1.4–7.7)
Neutrophils Relative %: 71.8 % (ref 43.0–77.0)
Platelets: 161 10*3/uL (ref 150.0–400.0)
RBC: 3.77 Mil/uL — ABNORMAL LOW (ref 3.87–5.11)
RDW: 22.8 % — ABNORMAL HIGH (ref 11.5–15.5)
WBC: 11.4 10*3/uL — ABNORMAL HIGH (ref 4.0–10.5)

## 2023-01-18 LAB — COMPREHENSIVE METABOLIC PANEL
ALT: 11 U/L (ref 0–35)
AST: 20 U/L (ref 0–37)
Albumin: 3.8 g/dL (ref 3.5–5.2)
Alkaline Phosphatase: 66 U/L (ref 39–117)
BUN: 25 mg/dL — ABNORMAL HIGH (ref 6–23)
CO2: 27 mEq/L (ref 19–32)
Calcium: 8.8 mg/dL (ref 8.4–10.5)
Chloride: 103 mEq/L (ref 96–112)
Creatinine, Ser: 0.9 mg/dL (ref 0.40–1.20)
GFR: 56.84 mL/min — ABNORMAL LOW (ref >60.00)
Glucose, Bld: 96 mg/dL (ref 70–99)
Potassium: 4.3 mEq/L (ref 3.5–5.1)
Sodium: 138 mEq/L (ref 135–145)
Total Bilirubin: 0.5 mg/dL (ref 0.2–1.2)
Total Protein: 6.1 g/dL (ref 6.0–8.3)

## 2023-01-18 LAB — URINALYSIS, ROUTINE W REFLEX MICROSCOPIC
Bilirubin Urine: NEGATIVE
Ketones, ur: NEGATIVE
Leukocytes,Ua: NEGATIVE
Nitrite: NEGATIVE
Specific Gravity, Urine: 1.02 (ref 1.000–1.030)
Total Protein, Urine: 100 — AB
Urine Glucose: NEGATIVE
Urobilinogen, UA: 0.2 (ref 0.0–1.0)
pH: 7 (ref 5.0–8.0)

## 2023-01-18 LAB — TSH: TSH: 1.32 u[IU]/mL (ref 0.35–5.50)

## 2023-01-19 LAB — URINE CULTURE
MICRO NUMBER:: 14856070
SPECIMEN QUALITY:: ADEQUATE

## 2023-01-21 ENCOUNTER — Encounter: Payer: Self-pay | Admitting: Internal Medicine

## 2023-01-21 ENCOUNTER — Ambulatory Visit (INDEPENDENT_AMBULATORY_CARE_PROVIDER_SITE_OTHER): Payer: Medicare Other | Admitting: Internal Medicine

## 2023-01-21 VITALS — BP 122/74 | HR 75 | Temp 97.5°F | Ht 65.0 in | Wt 130.0 lb

## 2023-01-21 DIAGNOSIS — L439 Lichen planus, unspecified: Secondary | ICD-10-CM | POA: Diagnosis not present

## 2023-01-21 DIAGNOSIS — R300 Dysuria: Secondary | ICD-10-CM | POA: Diagnosis not present

## 2023-01-21 DIAGNOSIS — I890 Lymphedema, not elsewhere classified: Secondary | ICD-10-CM | POA: Diagnosis not present

## 2023-01-21 DIAGNOSIS — F411 Generalized anxiety disorder: Secondary | ICD-10-CM | POA: Diagnosis not present

## 2023-01-21 DIAGNOSIS — Z5309 Procedure and treatment not carried out because of other contraindication: Secondary | ICD-10-CM | POA: Insufficient documentation

## 2023-01-21 DIAGNOSIS — I471 Supraventricular tachycardia, unspecified: Secondary | ICD-10-CM

## 2023-01-21 DIAGNOSIS — Z8781 Personal history of (healed) traumatic fracture: Secondary | ICD-10-CM

## 2023-01-21 LAB — URINALYSIS, ROUTINE W REFLEX MICROSCOPIC
Bilirubin Urine: NEGATIVE
Hgb urine dipstick: NEGATIVE
Ketones, ur: NEGATIVE
Leukocytes,Ua: NEGATIVE
Nitrite: NEGATIVE
Specific Gravity, Urine: 1.02 (ref 1.000–1.030)
Total Protein, Urine: 100 — AB
Urine Glucose: NEGATIVE
Urobilinogen, UA: 0.2 (ref 0.0–1.0)
pH: 7 (ref 5.0–8.0)

## 2023-01-21 MED ORDER — EZETIMIBE 10 MG PO TABS: 10.0000 mg | ORAL_TABLET | Freq: Every day | ORAL | 3 refills | Status: DC

## 2023-01-21 MED ORDER — FUROSEMIDE 20 MG PO TABS: 10.0000 mg | ORAL_TABLET | ORAL | 0 refills | Status: DC

## 2023-01-21 MED ORDER — METOPROLOL SUCCINATE ER 25 MG PO TB24: 25.0000 mg | ORAL_TABLET | Freq: Every day | ORAL | 1 refills | Status: DC

## 2023-01-21 MED ORDER — AMIODARONE HCL 200 MG PO TABS: 200.0000 mg | ORAL_TABLET | Freq: Every day | ORAL | 3 refills | Status: DC

## 2023-01-21 NOTE — Assessment & Plan Note (Signed)
Persistent despite negative culture and empiric treatment with fosfomycin given pyuria on U . Repeating today .  WILL NOT treat unless UA and culture are congruent

## 2023-01-21 NOTE — Assessment & Plan Note (Signed)
Continue metoprolol and amiodarone  once daily

## 2023-01-21 NOTE — Patient Instructions (Addendum)
Continue using the estrogen cream every night  Add the clobetasol ointment if the burning persists  ( to treat the lichen planus)   Your leg will respond better to compression using a lymphedema pump, and this will allow you to stop using the lasix .  Referral to Lymphedema clinic at [facility] is in progress   You are returning to [facility] for the fluid filled lump on your hip

## 2023-01-21 NOTE — Assessment & Plan Note (Signed)
ADVISED to resume clobetasol cream as her dysuria may be from LP recurrence.

## 2023-01-21 NOTE — Progress Notes (Signed)
Subjective:  Patient ID: Brenda Cardenas, female    DOB: 21-Nov-1931  Age: 89 y.o. MRN: [id number]  CC: The primary encounter diagnosis was Contraindication to statin medication. Diagnoses of Dysuria, Lymphedema, Atrophic lichen planus, GAD (generalized anxiety disorder), History of fracture due to fall, and Supraventricular tachycardia were also pertinent to this visit. HPI [name] presents for  Chief Complaint  Patient presents with   Medical Management of Chronic Issues   1) recurrent  cystitis : treated April 11 with  one dose of fosfomycin, had "loose bowels" several days later.  However, she Has diarrhea with all antibiotics except Sulfa (rash).  Culture was negative .  She continues to have dysuria  with urination .  Sees  a new uologist on May 16 at [facility]. Using estradiol cream inghtly on her urethra .  Also has lichen planus managed with evey other day clobetasol  which she has not been using for the last week    2) Frequent falls: none since   Feb 12:  Fall occurred when left leg gave way after getting up from a chair in which she had fallen asleep (attributed to excessive sedation from zoloft previous dose)  .  subacute appearing type I dens fracture that is partially resorbed. Likely from a previous fall  and a right sided anterior 4th rib fracture, minimally displaced.  Everything else was old or subacute, and she reports no pain in her chest currently.   Using a walker now due to  left leg giving way . Has OA of knee per [facility] July 2023  x rays.  No plans for TKR. Per [facility].  She is now using a 3 wheeled walker at home and walking with an aide .  [facility] has ordered a home OT evaluation to add safety bars in the bathroom.  She is no longer driving   3) SVT : taking metoprolol and amiodarone 200 mg daily per [facility].  Needs Refill    4) aortic atherosclerosis:  Reviewed findings of prior CT scan today..  Patient is  INTOLERANT OF statin therpay DUE TO ELEVATED LIVER ENZYMES  buy  she is TAKING ZETIA   5) GAD:  less sedation with lower dose of sertraline .     6) left leg edema;  not tolerating lasix due to frequent excessive urination.  Not using enough compression .  Discussed referral to lymphedema clinic   Outpatient Medications Prior to Visit  Medication Sig Dispense Refill   acetaminophen (TYLENOL) 325 MG tablet Take 650 mg by mouth every 6 (six) hours as needed.     Calcium Citrate-Vitamin D (CITRACAL + D PO) Take 2 capsules by mouth every other day. Alternates with Vitamin D3     Cholecalciferol (VITAMIN D) 50 MCG (2000 UT) CAPS Take 1 capsule by mouth every other day.     clobetasol ointment (TEMOVATE) 0.05 % Apply 1 Application topically 2 (two) times daily. 60 g 1   CRANBERRY PO Take 2 capsules by mouth daily.      estradiol (ESTRACE) 0.1 MG/GM vaginal cream 1 Applicatorful every other day.     hyoscyamine (ANASPAZ) 0.125 MG TBDP disintergrating tablet Place 1 tablet (0.125 mg total) under the tongue every 6 (six) hours as needed (as needed for esophageal spasm). 30 tablet 0   omeprazole (PRILOSEC) 20 MG capsule TAKE 1 CAPSULE BY MOUTH EVERY DAY 90 capsule 3   Probiotic Product (ALIGN PO) Take 1 tablet by mouth daily.  sertraline (ZOLOFT) 50 MG tablet Take 1 tablet (50 mg total) by mouth daily. 90 tablet 2   amiodarone (PACERONE) 200 MG tablet Take 1 tablet (200 mg total) by mouth daily. 90 tablet 3   ezetimibe (ZETIA) 10 MG tablet Take 1 tablet (10 mg total) by mouth daily. 90 tablet 3   furosemide (LASIX) 20 MG tablet Take 0.5 tablets (10 mg total) by mouth 2 (two) times a week. Take 1-2 tablets per week as needed for swelling 30 tablet 0   metoprolol succinate (TOPROL-XL) 25 MG 24 hr tablet Take 1 tablet (25 mg total) by mouth daily. 90 tablet 1   No facility-administered medications prior to visit.    Review of Systems;  Patient denies headache, fevers, malaise, unintentional weight loss, skin rash, eye pain, sinus congestion and sinus pain,  sore throat, dysphagia,  hemoptysis , cough, dyspnea, wheezing, chest pain, palpitations, orthopnea, edema, abdominal pain, nausea, melena,, persistent  diarrhea, constipation, flank pain, hematuria, urinary  Frequency, nocturia, numbness, tingling, seizures,  Focal weakness, Loss of consciousness,  Tremor, insomnia, depression, anxiety, and suicidal ideation.      Objective:  BP 122/74   Pulse 75   Temp (!) 97.5 F (36.4 C) (Oral)   Ht 5\' 5"  (1.651 m)   Wt 130 lb (59 kg)   SpO2 93%   BMI 21.63 kg/m   BP Readings from Last 3 Encounters:  01/21/23 122/74  12/02/22 (!) 142/70  11/16/22 (!) 144/66    Wt Readings from Last 3 Encounters:  01/21/23 130 lb (59 kg)  01/07/23 128 lb (58.1 kg)  12/02/22 128 lb 6.4 oz (58.2 kg)    Physical Exam Vitals reviewed.  Constitutional:      General: She is not in acute distress.    Appearance: Normal appearance. She is normal weight. She is not ill-appearing, toxic-appearing or diaphoretic.  HENT:     Head: Normocephalic.  Eyes:     General: No scleral icterus.       Right eye: No discharge.        Left eye: No discharge.     Conjunctiva/sclera: Conjunctivae normal.  Cardiovascular:     Rate and Rhythm: Normal rate and regular rhythm.     Heart sounds: Normal heart sounds.  Pulmonary:     Effort: Pulmonary effort is normal. No respiratory distress.     Breath sounds: Normal breath sounds.  Musculoskeletal:        General: Normal range of motion.  Skin:    General: Skin is warm and dry.          Comments: Diffuse chronic pitting edema/lymphedema   Neurological:     General: No focal deficit present.     Mental Status: She is alert and oriented to person, place, and time. Mental status is at baseline.  Psychiatric:        Mood and Affect: Mood normal.        Behavior: Behavior normal.        Thought Content: Thought content normal.        Judgment: Judgment normal.     Lab Results  Component Value Date   HGBA1C 5.4  01/05/2020   HGBA1C 5.5 06/16/2018   HGBA1C 5.7 03/24/2018    Lab Results  Component Value Date   CREATININE 0.90 01/18/2023   CREATININE 1.07 10/12/2022   CREATININE 1.00 09/14/2022    Lab Results  Component Value Date   WBC 11.4 (H) 01/18/2023   HGB 12.1 01/18/2023  HCT 37.4 01/18/2023   PLT 161.0 01/18/2023   GLUCOSE 96 01/18/2023   CHOL 150 05/28/2022   TRIG 86.0 05/28/2022   HDL 54.00 05/28/2022   LDLDIRECT 103.0 06/16/2017   LDLCALC 79 05/28/2022   ALT 11 01/18/2023   AST 20 01/18/2023   NA 138 01/18/2023   K 4.3 01/18/2023   CL 103 01/18/2023   CREATININE 0.90 01/18/2023   BUN 25 (H) 01/18/2023   CO2 27 01/18/2023   TSH 1.32 01/18/2023   INR 1.2 08/30/2022   HGBA1C 5.4 01/05/2020   MICROALBUR 1.0 01/05/2020    CT Chest Wo Contrast  Result Date: 11/09/2022 CLINICAL DATA:  Chest trauma. EXAM: CT CHEST WITHOUT CONTRAST TECHNIQUE: Multidetector CT imaging of the chest was performed following the standard protocol without IV contrast. RADIATION DOSE REDUCTION: This exam was performed according to the departmental dose-optimization program which includes automated exposure control, adjustment of the mA and/or kV according to patient size and/or use of iterative reconstruction technique. COMPARISON:  CT chest abdomen and pelvis 06/28/2020 FINDINGS: Cardiovascular: No significant vascular findings. Normal heart size. No pericardial effusion. There are atherosclerotic calcifications of the aorta and coronary arteries. Mediastinum/Nodes: Moderate-sized hiatal hernia is unchanged. Esophagus and thyroid gland are within normal limits as visualized. No enlarged lymph nodes are seen. Lungs/Pleura: There is a 3 mm peripheral nodule in the right upper lobe which is unchanged from 2021 and favored as benign given stability. Lungs are otherwise clear. No pleural effusion or pneumothorax. Upper Abdomen: No acute abnormality. Cholecystectomy clips are present. Musculoskeletal: There is an  acute anterior right fourth rib fracture, minimally displaced. There is scoliosis of the thoracolumbar spine. Subcentimeter sclerotic focus in a midthoracic vertebral body is unchanged and favored as benign. There severe degenerative changes of both shoulders. There are healed posterior left rib fractures. IMPRESSION: 1. Acute anterior right fourth rib fracture. No pneumothorax. 2. Stable moderate-sized hiatal hernia. Aortic Atherosclerosis (ICD10-I70.0). Electronically Signed   By: Darliss Cheney M.D.   On: 11/09/2022 22:24   CT Thoracic Spine Wo Contrast  Result Date: 11/09/2022 CLINICAL DATA:  Fall, EXAM: CT THORACIC SPINE WITHOUT CONTRAST TECHNIQUE: Multidetector CT images of the thoracic were obtained using the standard protocol without intravenous contrast. RADIATION DOSE REDUCTION: This exam was performed according to the departmental dose-optimization program which includes automated exposure control, adjustment of the mA and/or kV according to patient size and/or use of iterative reconstruction technique. COMPARISON:  None Available. FINDINGS: Alignment: No traumatic listhesis. Dextrocurvature of the thoracic spine with compensatory levocurvature of the imaged lumbar spine. 5 mm right lateral listhesis of T12 on L1. Vertebrae: No acute fracture or suspicious osseous lesion. Partial osseous fusion of L1 and L2 across the right aspect of the disc space. Remote fractures of the proximal left tenth, eleventh, and twelfth ribs, which are new compared to the 06/28/2020 CT chest. Paraspinal and other soft tissues: Please see same-day CT chest Disc levels: No significant spinal canal stenosis. Moderate right neural foraminal narrowing at T11-T12 and T12-L1. IMPRESSION: 1. No acute fracture in the thoracic spine. 2. Remote fractures of the proximal left tenth, eleventh, and twelfth ribs, which are new compared to the 06/28/2020 CT chest but appear chronic. Please see same-day CT chest for findings in the soft  tissues and lateral aspect of the ribs. Electronically Signed   By: Wiliam Ke M.D.   On: 11/09/2022 22:18   DG Chest 2 View  Result Date: 11/09/2022 CLINICAL DATA:  Larey Seat out of bed EXAM: CHEST -  2 VIEW COMPARISON:  08/29/2022 FINDINGS: The heart size and mediastinal contours are within normal limits. Both lungs are clear. The visualized skeletal structures are unremarkable. IMPRESSION: No active cardiopulmonary disease. Electronically Signed   By: Jasmine Pang M.D.   On: 11/09/2022 21:00   CT Head Wo Contrast  Result Date: 11/09/2022 CLINICAL DATA:  Fall last night EXAM: CT HEAD WITHOUT CONTRAST CT CERVICAL SPINE WITHOUT CONTRAST TECHNIQUE: Multidetector CT imaging of the head and cervical spine was performed following the standard protocol without intravenous contrast. Multiplanar CT image reconstructions of the cervical spine were also generated. RADIATION DOSE REDUCTION: This exam was performed according to the departmental dose-optimization program which includes automated exposure control, adjustment of the mA and/or kV according to patient size and/or use of iterative reconstruction technique. COMPARISON:  06/28/2020 FINDINGS: CT HEAD FINDINGS Brain: No evidence of acute infarction, hemorrhage, hydrocephalus, extra-axial collection or mass lesion/mass effect. Periventricular white matter hypodensity. Vascular: No hyperdense vessel or unexpected calcification. Skull: Normal. Negative for fracture or focal lesion. Sinuses/Orbits: No acute finding. Other: None. CT CERVICAL SPINE FINDINGS Alignment: Normal cervical lordosis. Degenerative anterolisthesis of C3 on C4 and C4 on C5. Skull base and vertebrae: No acute fracture. Subacute appearing, partially resorbed, nondisplaced type I fracture of the dens (series 6, image 21). No primary bone lesion or focal pathologic process. Soft tissues and spinal canal: No prevertebral fluid or swelling. No visible canal hematoma. Disc levels: Moderate multilevel  disc space height loss and osteophytosis, worst at C5-C7. Upper chest: Negative. Other: None. IMPRESSION: 1. No acute intracranial pathology. Small-vessel white matter disease. 2. Subacute appearing, partially resorbed, nondisplaced type I fracture of the dens. 3. Moderate multilevel cervical disc degenerative disease. These results were called by telephone at the time of interpretation on 11/09/2022 at 7:32 pm to Dr. Tresa Endo [facility] , who verbally acknowledged these results. Electronically Signed   By: Jearld Lesch M.D.   On: 11/09/2022 19:37   CT Cervical Spine Wo Contrast  Result Date: 11/09/2022 CLINICAL DATA:  Fall last night EXAM: CT HEAD WITHOUT CONTRAST CT CERVICAL SPINE WITHOUT CONTRAST TECHNIQUE: Multidetector CT imaging of the head and cervical spine was performed following the standard protocol without intravenous contrast. Multiplanar CT image reconstructions of the cervical spine were also generated. RADIATION DOSE REDUCTION: This exam was performed according to the departmental dose-optimization program which includes automated exposure control, adjustment of the mA and/or kV according to patient size and/or use of iterative reconstruction technique. COMPARISON:  06/28/2020 FINDINGS: CT HEAD FINDINGS Brain: No evidence of acute infarction, hemorrhage, hydrocephalus, extra-axial collection or mass lesion/mass effect. Periventricular white matter hypodensity. Vascular: No hyperdense vessel or unexpected calcification. Skull: Normal. Negative for fracture or focal lesion. Sinuses/Orbits: No acute finding. Other: None. CT CERVICAL SPINE FINDINGS Alignment: Normal cervical lordosis. Degenerative anterolisthesis of C3 on C4 and C4 on C5. Skull base and vertebrae: No acute fracture. Subacute appearing, partially resorbed, nondisplaced type I fracture of the dens (series 6, image 21). No primary bone lesion or focal pathologic process. Soft tissues and spinal canal: No prevertebral fluid or swelling. No  visible canal hematoma. Disc levels: Moderate multilevel disc space height loss and osteophytosis, worst at C5-C7. Upper chest: Negative. Other: None. IMPRESSION: 1. No acute intracranial pathology. Small-vessel white matter disease. 2. Subacute appearing, partially resorbed, nondisplaced type I fracture of the dens. 3. Moderate multilevel cervical disc degenerative disease. These results were called by telephone at the time of interpretation on 11/09/2022 at 7:32 pm to Dr. Tresa Endo [facility] , who  verbally acknowledged these results. Electronically Signed   By: Jearld Lesch M.D.   On: 11/09/2022 19:37    Assessment & Plan:  .Contraindication to statin medication  Dysuria Assessment & Plan: Persistent despite negative culture and empiric treatment with fosfomycin given pyuria on U . Repeating today .  WILL NOT treat unless UA and culture are congruent   Orders: -     Urinalysis, Routine w reflex microscopic -     Urine Culture  Lymphedema Assessment & Plan: She has marked asymmetric chronic swelling of left lower leg.  Use of furosemide has been problematic  .  Referring to OT for lymphedema management   Orders: -     Ambulatory referral to Occupational Therapy  Atrophic lichen planus Assessment & Plan: ADVISED to resume clobetasol cream as her dysuria may be from LP recurrence.     GAD (generalized anxiety disorder) Assessment & Plan: She reports improved mood and less worrying since starting zoloft  but has reduced dose to 25 mg due to excessive sedation    History of fracture due to fall Assessment & Plan: Attributed to falling asleep at the table due to zoloft dose. Has reduced her dose    Supraventricular tachycardia Assessment & Plan: Continue metoprolol and amiodarone 200mg  once daily    Other orders -     Furosemide; Take 0.5 tablets (10 mg total) by mouth 2 (two) times a week. Take 1-2 tablets per week as needed for swelling  Dispense: 30 tablet; Refill: 0 -      Metoprolol Succinate ER; Take 1 tablet (25 mg total) by mouth daily.  Dispense: 90 tablet; Refill: 1 -     Amiodarone HCl; Take 1 tablet (200 mg total) by mouth daily.  Dispense: 90 tablet; Refill: 3 -     Ezetimibe; Take 1 tablet (10 mg total) by mouth daily.  Dispense: 90 tablet; Refill: 3     Follow-up: Return in about 3 months (around 04/22/2023).   Sherlene Shams, MD

## 2023-01-21 NOTE — Assessment & Plan Note (Signed)
She reports improved mood and less worrying since starting zoloft  but has reduced dose to 25 mg due to excessive sedation

## 2023-01-21 NOTE — Assessment & Plan Note (Addendum)
She has marked asymmetric chronic swelling of left lower leg.  Use of furosemide has been problematic  .  Referring to OT for lymphedema management

## 2023-01-21 NOTE — Assessment & Plan Note (Signed)
Attributed to falling asleep at the table due to zoloft dose. Has reduced her dose

## 2023-01-23 LAB — URINE CULTURE
MICRO NUMBER:: 14873992
SPECIMEN QUALITY:: ADEQUATE

## 2023-01-29 ENCOUNTER — Emergency Department
Admission: EM | Admit: 2023-01-29 | Discharge: 2023-01-30 | Disposition: A | Discharge status: Home / Self Care | Payer: Medicare Other | Attending: Emergency Medicine | Admitting: Emergency Medicine

## 2023-01-29 ENCOUNTER — Other Ambulatory Visit: Payer: Self-pay

## 2023-01-29 ENCOUNTER — Emergency Department: Payer: Medicare Other

## 2023-01-29 ENCOUNTER — Encounter: Payer: Self-pay | Admitting: Emergency Medicine

## 2023-01-29 DIAGNOSIS — W1839XA Other fall on same level, initial encounter: Secondary | ICD-10-CM | POA: Insufficient documentation

## 2023-01-29 DIAGNOSIS — S299XXA Unspecified injury of thorax, initial encounter: Secondary | ICD-10-CM | POA: Diagnosis present

## 2023-01-29 DIAGNOSIS — K5730 Diverticulosis of large intestine without perforation or abscess without bleeding: Secondary | ICD-10-CM | POA: Diagnosis not present

## 2023-01-29 DIAGNOSIS — N1330 Unspecified hydronephrosis: Secondary | ICD-10-CM | POA: Diagnosis not present

## 2023-01-29 DIAGNOSIS — K449 Diaphragmatic hernia without obstruction or gangrene: Secondary | ICD-10-CM | POA: Insufficient documentation

## 2023-01-29 DIAGNOSIS — R300 Dysuria: Secondary | ICD-10-CM | POA: Insufficient documentation

## 2023-01-29 DIAGNOSIS — W19XXXA Unspecified fall, initial encounter: Secondary | ICD-10-CM

## 2023-01-29 LAB — CBC WITH DIFFERENTIAL/PLATELET
Abs Immature Granulocytes: 0.11 10*3/uL — ABNORMAL HIGH (ref 0.00–0.07)
Basophils Absolute: 0.1 10*3/uL (ref 0.0–0.1)
Basophils Relative: 1 %
Eosinophils Absolute: 0.4 10*3/uL (ref 0.0–0.5)
Eosinophils Relative: 3 %
HCT: 40.2 % (ref 36.0–46.0)
Hemoglobin: 12.9 g/dL (ref 12.0–15.0)
Immature Granulocytes: 1 %
Lymphocytes Relative: 15 %
Lymphs Abs: 2.2 10*3/uL (ref 0.7–4.0)
MCH: 31.5 pg (ref 26.0–34.0)
MCHC: 32.1 g/dL (ref 30.0–36.0)
MCV: 98 fL (ref 80.0–100.0)
Monocytes Absolute: 0.8 10*3/uL (ref 0.1–1.0)
Monocytes Relative: 5 %
Neutro Abs: 10.9 10*3/uL — ABNORMAL HIGH (ref 1.7–7.7)
Neutrophils Relative %: 75 %
Platelets: 145 10*3/uL — ABNORMAL LOW (ref 150–400)
RBC: 4.1 MIL/uL (ref 3.87–5.11)
RDW: 19.6 % — ABNORMAL HIGH (ref 11.5–15.5)
WBC: 14.6 10*3/uL — ABNORMAL HIGH (ref 4.0–10.5)
nRBC: 0.1 % (ref 0.0–0.2)

## 2023-01-29 NOTE — Discharge Instructions (Signed)
Use incentive spirometer as directed.  You may take Tylenol as needed for pain.  Please seek medical attention for any high fevers, chest pain, shortness of breath, change in behavior, persistent vomiting, bloody stool or any other new or concerning symptoms.

## 2023-01-29 NOTE — ED Provider Notes (Signed)
[facility] Provider Note    Event Date/Time   First MD Initiated Contact with Patient 01/29/23 2059     (approximate)   History   Fall   HPI  Brenda Cardenas is a 89 y.o. female  who presents to the emergency department today after a fall. She says that she thinks one of her shoes was loose and that it caught on the way to the bathroom and caused her to fall onto her left side. The patient denies any head injury.  She states that she has fallen in the past and has broken ribs on her right side so she is concerned that she did that on the left side. She also states she is being worked up for painful urination by her primary care doctor with multiple negative UAs in the past two weeks and has follow up scheduled with urology.     Physical Exam   Triage Vital Signs: ED Triage Vitals  Enc Vitals Group     BP 01/29/23 1832 (!) 166/95     Pulse Rate 01/29/23 1832 61     Resp 01/29/23 1832 17     Temp 01/29/23 1832 98.1 F (36.7 C)     Temp Source 01/29/23 1832 Oral     SpO2 01/29/23 1832 99 %     Weight 01/29/23 1833 130 lb 1.1 oz (59 kg)     Height 01/29/23 1833 5\' 5"  (1.651 m)     Head Circumference --      Peak Flow --      Pain Score 01/29/23 1832 9     Pain Loc --      Pain Edu? --      Excl. in GC? --     Most recent vital signs: Vitals:   01/29/23 1832  BP: (!) 166/95  Pulse: 61  Resp: 17  Temp: 98.1 F (36.7 C)  SpO2: 99%   General: Awake, alert, oriented. CV:  Good peripheral perfusion. Regular rate and rhythm. Resp:  Normal effort. Lungs clear. Abd:  No distention.  Other:  Tender to palpation of the left lower chest wall.   ED Results / Procedures / Treatments   Labs (all labs ordered are listed, but only abnormal results are displayed) Labs Reviewed  CBC WITH DIFFERENTIAL/PLATELET - Abnormal; Notable for the following components:      Result Value   WBC 14.6 (*)    RDW 19.6 (*)    Platelets 145 (*)    Neutro Abs 10.9  (*)    Abs Immature Granulocytes 0.11 (*)    All other components within normal limits  BASIC METABOLIC PANEL  URINALYSIS, ROUTINE W REFLEX MICROSCOPIC  TROPONIN I (HIGH SENSITIVITY)       RADIOLOGY I independently interpreted and visualized the CXR. My interpretation: No pneumothorax Radiology interpretation:  IMPRESSION:  No active cardiopulmonary disease.      PROCEDURES:  Critical Care performed: No    MEDICATIONS ORDERED IN ED: Medications - No data to display   IMPRESSION / MDM / ASSESSMENT AND PLAN / ED COURSE  I reviewed the triage vital signs and the nursing notes.                              Differential diagnosis includes, but is not limited to, rib contusion, rib fracture, pneumothorax  Patient's presentation is most consistent with acute presentation with potential threat to life or bodily function.  Patient presented to the emergency department today because of concerns for left lower chest pain after a mechanical fall.  On exam patient is tender over the left lower chest.  Chest x-ray was performed did not show any rib fractures.  I did have a discussion with the patient.  Did state that the x-ray was negative but it is possible she might have a small fracture there.  Did discuss incentive spirometer with the patient.  Also discussed pain control however patient states she can only take Tylenol.  Did plan on discharging patient after nurse demonstrated incentive spirometry.  However while patient was having incentive spirometer show to her she felt like her heart started racing.  She states that this does happen and that she takes half of the metoprolol every 30 minutes up to 3 doses to help with this.  She did take one of her home metoprolol's.  Additionally she started complaining of discomfort and the sensation of needing to urinate.  Apparently this is similar to what she has been experiencing although she states that it is worse since she has been lying  down.  She says normally she can manage it better if she can sit up and use the toilet.  Will check a UA here however I have low suspicion we will find infection given she states she has had multiple UAs checked over the past 2 weeks.  Initially we will check blood work and troponin given elevated heart rate.  Furthermore will add on CT chest to make sure we do not see evidence of significant rib fractures or sternal fracture that might have also caused cardiac injury.   FINAL CLINICAL IMPRESSION(S) / ED DIAGNOSES   Final diagnoses:  Fall, initial encounter  Rib injury  Dysuria    Note:  This document was prepared using Dragon voice recognition software and may include unintentional dictation errors.    Phineas Semen, MD 01/29/23 [phone]

## 2023-01-29 NOTE — ED Triage Notes (Signed)
Pt states she was walking and had a mechanical fall. Pt states she went down on her left side. Pt states her left ribs as well as her left breast hurts now. She was able to get up with assistance.

## 2023-01-30 ENCOUNTER — Emergency Department: Payer: Medicare Other

## 2023-01-30 DIAGNOSIS — S299XXA Unspecified injury of thorax, initial encounter: Secondary | ICD-10-CM | POA: Diagnosis not present

## 2023-01-30 LAB — URINALYSIS, ROUTINE W REFLEX MICROSCOPIC
Bilirubin Urine: NEGATIVE
Glucose, UA: NEGATIVE mg/dL
Ketones, ur: NEGATIVE mg/dL
Leukocytes,Ua: NEGATIVE
Nitrite: NEGATIVE
Protein, ur: 30 mg/dL — AB
Specific Gravity, Urine: 1.005 (ref 1.005–1.030)
pH: 7 (ref 5.0–8.0)

## 2023-01-30 LAB — BASIC METABOLIC PANEL
Anion gap: 11 (ref 5–15)
BUN: 23 mg/dL (ref 8–23)
CO2: 21 mmol/L — ABNORMAL LOW (ref 22–32)
Calcium: 9 mg/dL (ref 8.9–10.3)
Chloride: 103 mmol/L (ref 98–111)
Creatinine, Ser: 0.69 mg/dL (ref 0.44–1.00)
GFR, Estimated: >60 mL/min (ref >60)
Glucose, Bld: 128 mg/dL — ABNORMAL HIGH (ref 70–99)
Potassium: 3.5 mmol/L (ref 3.5–5.1)
Sodium: 135 mmol/L (ref 135–145)

## 2023-01-30 LAB — LIPASE, BLOOD: Lipase: 30 U/L (ref 11–51)

## 2023-01-30 LAB — HEPATIC FUNCTION PANEL
ALT: 15 U/L (ref 0–44)
AST: 23 U/L (ref 15–41)
Albumin: 3.8 g/dL (ref 3.5–5.0)
Alkaline Phosphatase: 56 U/L (ref 38–126)
Bilirubin, Direct: 0.1 mg/dL (ref 0.0–0.2)
Indirect Bilirubin: 1 mg/dL — ABNORMAL HIGH (ref 0.3–0.9)
Total Bilirubin: 1.1 mg/dL (ref 0.3–1.2)
Total Protein: 6.8 g/dL (ref 6.5–8.1)

## 2023-01-30 LAB — TROPONIN I (HIGH SENSITIVITY): Troponin I (High Sensitivity): 15 ng/L (ref <18)

## 2023-01-30 MED ORDER — IOHEXOL 300 MG/ML  SOLN
100.0000 mL | Freq: Once | INTRAMUSCULAR | Status: AC | PRN
  Administered 2023-01-30: 100 mL via INTRAVENOUS

## 2023-01-30 MED ORDER — SODIUM CHLORIDE 0.9 % IV BOLUS
1000.0000 mL | Freq: Once | INTRAVENOUS | Status: AC
  Administered 2023-01-30: 1000 mL via INTRAVENOUS

## 2023-01-30 NOTE — ED Notes (Signed)
Dr. Derrill Kay gave patient permission for patient to take her home metoprolol. It is a 12.5mg  (half of a 25mg  tablet). Patient took the tablet at this time. Patient was advised by her primary to take 12.5mg  tablet for any breakthrough tachycardia.

## 2023-01-30 NOTE — ED Notes (Signed)
Per [facility], EDT pt's sats went from 97 to 92% on RA, and HR went from 71 to 77 while ambulating from the bed to the door. Per [facility] pt denies SOB, only c/o pain to arm/shoulder.

## 2023-01-30 NOTE — ED Provider Notes (Signed)
-----------------------------------------   1:28 AM on 01/30/2023 -----------------------------------------   CT Chest/Abd/Pel:  No significant traumatic injury in the chest, abdomen or pelvis.  Moderate-sized hiatal hernia.  No acute cardiopulmonary disease.  Mild bilateral hydronephrosis with bladder wall thickening. No visible obstructing stone. Recommend clinical correlation for urinary tract infection.  Sigmoid diverticulosis.  Fluid collection within the posterior subcutaneous soft tissues of the left hip and proximal left posterolateral thigh. This could be posttraumatic seroma or hematoma.   Patient explains that she had a fall several weeks ago and has had the left hip contusion so that is not acute tonight.  Heart rate and blood pressure improved after patient took her own metoprolol.  Room air saturation is 94%.  She is still experiencing a lot of pain both ribs and urinary tract.  Nursing to obtain bladder scan and ambulation trial.   ----------------------------------------- 2:20 AM on 01/30/2023 -----------------------------------------   Bladder scan 52 mL.  Patient was able to perform ambulation trial without hypoxia, tachycardia nor tachypnea.  Prefers to be discharged back to [facility] independent living rather than escalating her care as she does not meet inpatient hospital criteria.  She will take Tylenol and use incentive spirometer as instructed.  Strict return precautions given.  Patient verbalizes understanding and agrees with plan of care.   Irean Hong, MD 01/30/23 0330

## 2023-02-01 ENCOUNTER — Telehealth: Payer: Self-pay | Admitting: Internal Medicine

## 2023-02-01 NOTE — Telephone Encounter (Signed)
Spoke with pt and she stated that she went to the ED over the weekend for a fall. She stated that she did not break any bones but while she was at the hospital they put in a catheter and the urine had a lot of bright red blood in it. Then when she got home  They advised her to follow up with you today or tomorrow but there is no available appts with you either day or the rest of the week. Pt stated that she is scheduled to have a complete renal, bladder US done on May 16th but she stated that she was advised that if she had blood in her urine she may need to have a cystoscopy and be referred to Dr. Delana Meyer office. Pt is wanting to know if she needs to keep the [place] appt or just wait until she gets in with Dr. Apolinar Junes if needed.

## 2023-02-01 NOTE — Telephone Encounter (Signed)
Patient called and said she went to the ED. ED wants patient to follow up with provider by tomorrow. She has blood in her urine.

## 2023-02-02 NOTE — Telephone Encounter (Signed)
Pt does not think that she needs her urine checked since she was able to get worked in with her urologist in [place] this Thursday.

## 2023-02-02 NOTE — Telephone Encounter (Signed)
Patient called stated that she is going 02/04/2023 at [facility] to be scoped for bladder.

## 2023-02-10 NOTE — Telephone Encounter (Signed)
Patient was given contrast before being scoped last week.Patient still has diarrhea and wanted to know if could take Imodium Ad to stop diarrhea . She has 2 other test to do tomorrow, ultrasound and xray.

## 2023-02-10 NOTE — Telephone Encounter (Signed)
Left a message letting pt know that she can use the imodium.

## 2023-02-24 ENCOUNTER — Ambulatory Visit: Payer: Medicare Other | Admitting: Cardiology

## 2023-03-22 ENCOUNTER — Other Ambulatory Visit
Admission: RE | Admit: 2023-03-22 | Discharge: 2023-03-22 | Disposition: A | Discharge status: Home / Self Care | Payer: Medicare Other | Source: Ambulatory Visit | Attending: Cardiology | Admitting: Cardiology

## 2023-03-22 DIAGNOSIS — Z79899 Other long term (current) drug therapy: Secondary | ICD-10-CM | POA: Insufficient documentation

## 2023-03-22 DIAGNOSIS — I10 Essential (primary) hypertension: Secondary | ICD-10-CM | POA: Insufficient documentation

## 2023-03-22 DIAGNOSIS — I471 Supraventricular tachycardia, unspecified: Secondary | ICD-10-CM | POA: Insufficient documentation

## 2023-03-22 LAB — COMPREHENSIVE METABOLIC PANEL
ALT: 19 U/L (ref 0–44)
AST: 26 U/L (ref 15–41)
Albumin: 3.4 g/dL — ABNORMAL LOW (ref 3.5–5.0)
Alkaline Phosphatase: 67 U/L (ref 38–126)
Anion gap: 7 (ref 5–15)
BUN: 31 mg/dL — ABNORMAL HIGH (ref 8–23)
CO2: 26 mmol/L (ref 22–32)
Calcium: 9 mg/dL (ref 8.9–10.3)
Chloride: 106 mmol/L (ref 98–111)
Creatinine, Ser: 0.9 mg/dL (ref 0.44–1.00)
GFR, Estimated: >60 mL/min (ref >60)
Glucose, Bld: 113 mg/dL — ABNORMAL HIGH (ref 70–99)
Potassium: 4 mmol/L (ref 3.5–5.1)
Sodium: 139 mmol/L (ref 135–145)
Total Bilirubin: 0.8 mg/dL (ref 0.3–1.2)
Total Protein: 6 g/dL — ABNORMAL LOW (ref 6.5–8.1)

## 2023-03-22 LAB — T4, FREE: Free T4: 1.38 ng/dL — ABNORMAL HIGH (ref 0.61–1.12)

## 2023-03-22 LAB — TSH: TSH: 0.77 u[IU]/mL (ref 0.350–4.500)

## 2023-03-25 NOTE — Progress Notes (Signed)
Cardiology Office Note Date:  03/26/2023  Patient ID:  Brenda Cardenas, DOB August 31, 1932, MRN [id number] PCP:  Sherlene Shams, MD  Cardiologist:  Yvonne Kendall, MD Electrophysiologist: Lanier Prude, MD   Chief Complaint: SVT  History of Present Illness: Brenda Cardenas is a 89 y.o. female with PMH notable for SVT, HTN; seen today for Lanier Prude, MD for routine electrophysiology followup.  She last saw Dr. Lalla Brothers 11/2022 for routine follow-up of SVT. On amiodarone, doing well. She had recently fallen.  On follow-up today, she has continued to fall due to OA in her L knee. She has a bladder condition that is causing urinary incontinence, seeing urology at [facility] Because of this, she has not taken her PRN lasix in many weeks. Her left leg is significantly more swollen than right. She says her left leg is always swollen. She has an open area on her L knee that opened d/t a trash bag hitting it yesterday.  She continues to have palpitation episodes that are brief. Rarely she will have episodes where her HR climbs to 150-170. During these episodes, she takes extra 1/2 tab toprol (25mg ) as Dr. Lalla Brothers has instructed. These more-symptomatic episodes have only happened twice in the past year, and both times have been relieved with the PRN toprol.    she denies chest pain, dyspnea, PND, orthopnea, nausea, vomiting, dizziness, syncope, weight gain, or early satiety.    AAD History: Amiodarone   Past Medical History:  Diagnosis Date   Arthritis    Basal cell carcinoma 06/04/2010   left upper back   Chronic kidney disease    Colon polyps    Gastritis    GERD (gastroesophageal reflux disease)    Hiatal hernia    Hyperlipidemia    Hypertension    IBS (irritable bowel syndrome)    Low back pain radiating to left leg 03/28/2020   Sciatica, left side 04/15/2020   Squamous cell carcinoma of skin 06/26/2013   left mid pretibial   Squamous cell carcinoma of skin 08/21/2015    lower sternum   Squamous cell carcinoma of skin 10/14/2017   right mid pretibial   Squamous cell carcinoma of skin 03/17/2018   right prox lateral elbow   Squamous cell carcinoma of skin 09/07/2018   right post thigh   Squamous cell carcinoma of skin 05/11/2019   right mid med pretibial   Squamous cell carcinoma of skin 06/20/2019   right bicep   Supraventricular tachycardia     Past Surgical History:  Procedure Laterality Date   APPENDECTOMY  1959   CARPAL TUNNEL RELEASE     CATARACT EXTRACTION W/ INTRAOCULAR LENS IMPLANT Left    CATARACT EXTRACTION W/PHACO Right 04/20/2018   Procedure: CATARACT EXTRACTION PHACO AND INTRAOCULAR LENS PLACEMENT (IOC)  RIGHT;  Surgeon: Lockie Mola, MD;  Location: [facility];  Service: Ophthalmology;  Laterality: Right;   CHOLECYSTECTOMY  1995   COLONOSCOPY WITH PROPOFOL N/A 07/30/2022   Procedure: COLONOSCOPY WITH PROPOFOL;  Surgeon: Toney Reil, MD;  Location: [facility];  Service: Gastroenterology;  Laterality: N/A;   EYE SURGERY     GANGLION CYST EXCISION     JOINT REPLACEMENT Right 03/2008   [place]    KNEE ARTHROSCOPY     LITHOTRIPSY     SALIVARY GLAND SURGERY     SKIN CANCER EXCISION     TONSILLECTOMY     TONSILLECTOMY AND ADENOIDECTOMY     VEIN LIGATION      Current Outpatient  Medications  Medication Instructions   acetaminophen (TYLENOL) 650 mg, Oral, Every 6 hours PRN   amiodarone (PACERONE) 200 mg, Oral, Daily   cephALEXin (KEFLEX) 250 mg, Oral, Daily   Cholecalciferol (VITAMIN D) 50 MCG (2000 UT) CAPS 1 capsule, Oral, Every other day   clobetasol ointment (TEMOVATE) 0.05 % 1 Application, Topical, 2 times daily   CRANBERRY PO 2 capsules, Oral, Daily   estradiol (ESTRACE) 0.1 MG/GM vaginal cream 1 Applicatorful, Every other day   ezetimibe (ZETIA) 10 mg, Oral, Daily   furosemide (LASIX) 10 mg, Oral, 2 times weekly, Take 1-2 tablets per week as needed for swelling   hyoscyamine (ANASPAZ) 0.125 mg,  Sublingual, Every 6 hours PRN   omeprazole (PRILOSEC) 20 MG capsule TAKE 1 CAPSULE BY MOUTH EVERY DAY   Probiotic Product (ALIGN PO) 1 tablet, Oral, Daily   sertraline (ZOLOFT) 50 mg, Oral, Daily    Social History:  The patient  reports that she has never smoked. She has never used smokeless tobacco. She reports that she does not drink alcohol and does not use drugs.   Family History:  The patient's family history includes Arthritis in her mother; Diabetes in her father; Heart disease in her mother.  ROS:  Please see the history of present illness. All other systems are reviewed and otherwise negative.   PHYSICAL EXAM:  VS:  BP (!) 149/82 (BP Location: Left Arm, Patient Position: Sitting, Cuff Size: Normal)   Pulse 68   Ht 5\' 2"  (1.575 m)   Wt 126 lb (57.2 kg)   SpO2 98%   BMI 23.05 kg/m  BMI: Body mass index is 23.05 kg/m.  GEN- The patient is well appearing, alert and oriented x 3 today.   Lungs- Clear to ausculation bilaterally, normal work of breathing.  Heart- Regular rate and rhythm, no murmurs, rubs or gallops Extremities- 2-3+ peripheral edema L>>R, warm  EKG is ordered. Personal review of EKG from today shows:  NSR w 1st deg AV block, rate 68bpm, LAD        Recent Labs: 08/29/2022: B Natriuretic Peptide 364.1; Magnesium 1.8 01/29/2023: Hemoglobin 12.9; Platelets 145 03/22/2023: ALT 19; BUN 31; Creatinine, Ser 0.90; Potassium 4.0; Sodium 139; TSH 0.770  05/28/2022: Cholesterol 150; HDL 54.00; LDL Cholesterol 79; Total CHOL/HDL Ratio 3; Triglycerides 86.0; VLDL 17.2   Estimated Creatinine Clearance: 33.5 mL/min (by C-G formula based on SCr of 0.9 mg/dL).   Wt Readings from Last 3 Encounters:  03/26/23 126 lb (57.2 kg)  01/29/23 130 lb 1.1 oz (59 kg)  01/21/23 130 lb (59 kg)     Additional studies reviewed include: Previous EP, cardiology notes.   TTE, 05/13/2022 1. Left ventricular ejection fraction, by estimation, is 55 to 60%. The left ventricle has normal  function. The left ventricle has no regional wall motion abnormalities. Left ventricular diastolic parameters are consistent with Grade II diastolic dysfunction (pseudonormalization).   2. Right ventricular systolic function is normal. The right ventricular size is not well visualized.   3. The mitral valve is normal in structure. Mild mitral valve regurgitation.   4. The aortic valve was not well visualized. Aortic valve regurgitation is not visualized. Aortic valve sclerosis is present, with no evidence of aortic valve stenosis.   5. The inferior vena cava is normal in size with greater than 50% respiratory variability, suggesting right atrial pressure of 3 mmHg.   Long term monitor, 09/08/2021 The patient was monitored for 14 days. The predominant rhythm was sinus with an average rate  of 64 bpm (range 41-93 bpm in sinus). There were rare PACs and PVCs. 11 episodes of supraventricular tachycardia were observed, lasting up to 4 minutes 58 seconds with a maximum rate of 184 bpm. No prolonged pauses occurred. Patient triggered events correspond to SVT.   ASSESSMENT AND PLAN:  #) SVT Well-controlled on current regimen of amiodarone and toprol Labs earlier this week with stable liver and thyroid fxn Continue amiodarone 200mg  daily Continue 25mg  toprol daily + 1/2 tab PRN for tachycardia episodes  #) lower extremity edema Increased lower extremity edema above baseline chronic LLE edema Recommended patient take lasix every other day starting today x 2 doses, then PRN as prescribed   #) HTN Elevated today.  Recommend checking blood pressures 1-2 times per week at home and recording the values.  Recommend bringing these recordings to the primary care physician.   Current medicines are reviewed at length with the patient today.   The patient does not have concerns regarding her medicines.  The following changes were made today:  none  Labs/ tests ordered today include:  Orders Placed This  Encounter  Procedures   EKG 12-Lead     Disposition: Follow up with Dr. Lalla Brothers or EP APP in 6 months   Signed, Sherie Don, NP  03/26/23  1:30 PM  Electrophysiology [facility]

## 2023-03-26 ENCOUNTER — Ambulatory Visit: Payer: Medicare Other | Attending: Cardiology | Admitting: Cardiology

## 2023-03-26 ENCOUNTER — Encounter: Payer: Self-pay | Admitting: Cardiology

## 2023-03-26 VITALS — BP 149/82 | HR 68 | Ht 62.0 in | Wt 126.0 lb

## 2023-03-26 DIAGNOSIS — R600 Localized edema: Secondary | ICD-10-CM | POA: Diagnosis present

## 2023-03-26 DIAGNOSIS — I471 Supraventricular tachycardia, unspecified: Secondary | ICD-10-CM | POA: Diagnosis present

## 2023-03-26 DIAGNOSIS — I10 Essential (primary) hypertension: Secondary | ICD-10-CM | POA: Diagnosis present

## 2023-03-26 DIAGNOSIS — Z79899 Other long term (current) drug therapy: Secondary | ICD-10-CM | POA: Diagnosis present

## 2023-03-26 MED ORDER — METOPROLOL SUCCINATE ER 25 MG PO TB24: ORAL_TABLET | ORAL | 3 refills | Status: DC

## 2023-03-26 NOTE — Patient Instructions (Signed)
Medication Instructions:  Your physician recommends that you continue on your current medications as directed. Please refer to the Current Medication list given to you today.  *If you need a refill on your cardiac medications before your next appointment, please call your pharmacy*   Lab Work: No labs ordered  If you have labs (blood work) drawn today and your tests are completely normal, you will receive your results only by: MyChart Message (if you have MyChart) OR A paper copy in the mail If you have any lab test that is abnormal or we need to change your treatment, we will call you to review the results.   Testing/Procedures: No testing ordered  Follow-Up: At [facility], you and your health needs are our priority.  As part of our continuing mission to provide you with exceptional heart care, we have created designated Provider Care Teams.  These Care Teams include your primary Cardiologist (physician) and Advanced Practice Providers (APPs -  Physician Assistants and Nurse Practitioners) who all work together to provide you with the care you need, when you need it.  We recommend signing up for the patient portal called "MyChart".  Sign up information is provided on this After Visit Summary.  MyChart is used to connect with patients for Virtual Visits (Telemedicine).  Patients are able to view lab/test results, encounter notes, upcoming appointments, etc.  Non-urgent messages can be sent to your provider as well.   To learn more about what you can do with MyChart, go to [email or web address].    Your next appointment:   6 month(s)  Provider:   Cameron Lambert, MD or Suzann Riddle, NP 

## 2023-04-02 ENCOUNTER — Telehealth: Payer: Self-pay | Admitting: *Deleted

## 2023-04-02 NOTE — Telephone Encounter (Signed)
$  0 due, PA not required  Appt scheduled on 05/03/23

## 2023-04-09 ENCOUNTER — Ambulatory Visit (INDEPENDENT_AMBULATORY_CARE_PROVIDER_SITE_OTHER): Payer: Medicare Other | Admitting: Vascular Surgery

## 2023-04-09 VITALS — BP 149/77 | HR 55 | Resp 18 | Ht 60.0 in | Wt 126.4 lb

## 2023-04-09 DIAGNOSIS — I83893 Varicose veins of bilateral lower extremities with other complications: Secondary | ICD-10-CM | POA: Diagnosis not present

## 2023-04-09 DIAGNOSIS — I10 Essential (primary) hypertension: Secondary | ICD-10-CM | POA: Diagnosis not present

## 2023-04-09 DIAGNOSIS — E782 Mixed hyperlipidemia: Secondary | ICD-10-CM | POA: Diagnosis not present

## 2023-04-09 NOTE — Assessment & Plan Note (Signed)
Her swelling is relatively stable at this point.  She should continue wearing her compression socks, elevating her legs, being as active as possible.  Her multiple other issues most notably her cardiac issues are likely a major contributing factor.  Her left leg is the more severely affected side, but symptoms are fairly stable with conservative therapies.  She does have some venous disease on that side, but I do not think that intervention is likely to be of great benefit.  Follow-up in 6 months.

## 2023-04-09 NOTE — Progress Notes (Signed)
MRN : [id number]  Brenda Cardenas is a 89 y.o. ([birth year]) female who presents with chief complaint of  Chief Complaint  Patient presents with   Venous Insufficiency  .  History of Present Illness: Patient returns today in follow up of her leg swelling and venous insufficiency.  She is about the same in terms of her leg swelling.  Had gotten some better but has gotten worse recently.  She had an Radio broadcast assistant wraps a couple of weeks ago but her leg has stopped draining.  She still has a lot of dry scaling skin and scabs.  Her left leg is more severely affected than the right.  She has a litany of ongoing medical issues including cardiac issues, urologic and renal issues, and hypertension.  Current Outpatient Medications  Medication Sig Dispense Refill   acetaminophen (TYLENOL) 325 MG tablet Take 650 mg by mouth every 6 (six) hours as needed.     amiodarone (PACERONE) 200 MG tablet Take 1 tablet (200 mg total) by mouth daily. 90 tablet 3   cephALEXin (KEFLEX) 250 MG capsule Take 250 mg by mouth daily.     Cholecalciferol (VITAMIN D) 50 MCG (2000 UT) CAPS Take 1 capsule by mouth every other day.     clobetasol ointment (TEMOVATE) 0.05 % Apply 1 Application topically 2 (two) times daily. 60 g 1   CRANBERRY PO Take 2 capsules by mouth daily.      estradiol (ESTRACE) 0.1 MG/GM vaginal cream 1 Applicatorful every other day.     ezetimibe (ZETIA) 10 MG tablet Take 1 tablet (10 mg total) by mouth daily. 90 tablet 3   furosemide (LASIX) 20 MG tablet Take 0.5 tablets (10 mg total) by mouth 2 (two) times a week. Take 1-2 tablets per week as needed for swelling 30 tablet 0   hyoscyamine (ANASPAZ) 0.125 MG TBDP disintergrating tablet Place 1 tablet (0.125 mg total) under the tongue every 6 (six) hours as needed (as needed for esophageal spasm). 30 tablet 0   metoprolol succinate (TOPROL XL) 25 MG 24 hr tablet Take 1 tablet (25 mg total) by mouth daily. May also take 0.5 tablets (12.5 mg total) as needed  (tachycardia, may repeat x 2 for a total of 3 pills). 90 tablet 3   omeprazole (PRILOSEC) 20 MG capsule TAKE 1 CAPSULE BY MOUTH EVERY DAY 90 capsule 3   Probiotic Product (ALIGN PO) Take 1 tablet by mouth daily.      sertraline (ZOLOFT) 50 MG tablet Take 1 tablet (50 mg total) by mouth daily. 90 tablet 2   No current facility-administered medications for this visit.    Past Medical History:  Diagnosis Date   Arthritis    Basal cell carcinoma 06/04/2010   left upper back   Chronic kidney disease    Colon polyps    Gastritis    GERD (gastroesophageal reflux disease)    Hiatal hernia    Hyperlipidemia    Hypertension    IBS (irritable bowel syndrome)    Low back pain radiating to left leg 03/28/2020   Sciatica, left side 04/15/2020   Squamous cell carcinoma of skin 06/26/2013   left mid pretibial   Squamous cell carcinoma of skin 08/21/2015   lower sternum   Squamous cell carcinoma of skin 10/14/2017   right mid pretibial   Squamous cell carcinoma of skin 03/17/2018   right prox lateral elbow   Squamous cell carcinoma of skin 09/07/2018   right post thigh   Squamous cell  carcinoma of skin 05/11/2019   right mid med pretibial   Squamous cell carcinoma of skin 06/20/2019   right bicep   Supraventricular tachycardia     Past Surgical History:  Procedure Laterality Date   APPENDECTOMY  1959   CARPAL TUNNEL RELEASE     CATARACT EXTRACTION W/ INTRAOCULAR LENS IMPLANT Left    CATARACT EXTRACTION W/PHACO Right 04/20/2018   Procedure: CATARACT EXTRACTION PHACO AND INTRAOCULAR LENS PLACEMENT (IOC)  RIGHT;  Surgeon: Lockie Mola, MD;  Location: [facility];  Service: Ophthalmology;  Laterality: Right;   CHOLECYSTECTOMY  1995   COLONOSCOPY WITH PROPOFOL N/A 07/30/2022   Procedure: COLONOSCOPY WITH PROPOFOL;  Surgeon: Toney Reil, MD;  Location: [facility];  Service: Gastroenterology;  Laterality: N/A;   EYE SURGERY     GANGLION CYST EXCISION     JOINT  REPLACEMENT Right 03/2008   [place]    KNEE ARTHROSCOPY     LITHOTRIPSY     SALIVARY GLAND SURGERY     SKIN CANCER EXCISION     TONSILLECTOMY     TONSILLECTOMY AND ADENOIDECTOMY     VEIN LIGATION       Social History   Tobacco Use   Smoking status: Never   Smokeless tobacco: Never  Vaping Use   Vaping status: Never Used  Substance Use Topics   Alcohol use: No    Alcohol/week: 0.0 standard drinks of alcohol   Drug use: No       Family History  Problem Relation Age of Onset   Heart disease Mother    Arthritis Mother    Diabetes Father      Allergies  Allergen Reactions   Ciprofloxacin Diarrhea   Clindamycin/Lincomycin Diarrhea   Sulfa Antibiotics Rash    Rash in throat   Augmentin [Amoxicillin-Pot Clavulanate] Diarrhea   Gabapentin Other (See Comments)    Irregular heartbeat   Lidocaine     Other reaction(s): Not available   Naproxen Sodium Other (See Comments)    Causes gastritis   Prednisone     Atrial Fibrillation   Tramadol Diarrhea    "upset stomach, headache, dizziness, drowsiness"   Codeine Nausea Only and Rash    Upset stomach   Doxycycline Hyclate Nausea And Vomiting and Nausea Only    Dry heaves.    E-Mycin [Erythromycin] Rash   Macrobid [Nitrofurantoin] Rash    severe   Nabumetone Rash and Other (See Comments)    Upsets liver enzymes   Nitrofurantoin Monohyd Macro Rash   Nsaids Rash    Gastritis, GI ulceration.    Phenobarbital Other (See Comments), Rash and Anxiety    "got wild" Patient becomes very hyper and anxious   Statins Rash    Upsets liver enzymes    REVIEW OF SYSTEMS (Negative unless checked)   Constitutional: [] Weight loss  [] Fever  [] Chills Cardiac: [] Chest pain   [] Chest pressure   [x] Palpitations   [] Shortness of breath when laying flat   [] Shortness of breath at rest   [x] Shortness of breath with exertion. Vascular:  [] Pain in legs with walking   [] Pain in legs at rest   [] Pain in legs when laying flat    [] Claudication   [] Pain in feet when walking  [] Pain in feet at rest  [] Pain in feet when laying flat   [] History of DVT   [] Phlebitis   [x] Swelling in legs   [x] Varicose veins   [] Non-healing ulcers Pulmonary:   [] Uses home oxygen   [] Productive cough   [] Hemoptysis   []   Wheeze  [] COPD   [] Asthma Neurologic:  [] Dizziness  [] Blackouts   [] Seizures   [] History of stroke   [] History of TIA  [] Aphasia   [] Temporary blindness   [] Dysphagia   [] Weakness or numbness in arms   [] Weakness or numbness in legs Musculoskeletal:  [x] Arthritis   [] Joint swelling   [x] Joint pain   [] Low back pain Hematologic:  [] Easy bruising  [] Easy bleeding   [] Hypercoagulable state   [] Anemic  [] Hepatitis Gastrointestinal:  [] Blood in stool   [] Vomiting blood  [] Gastroesophageal reflux/heartburn   [] Abdominal pain Genitourinary:  [] Chronic kidney disease   [] Difficult urination  [] Frequent urination  [] Burning with urination   [] Hematuria Skin:  [] Rashes   [] Ulcers   [] Wounds Psychological:  [] History of anxiety   []  History of major depression.   Physical Examination  BP (!) 149/77 (BP Location: Left Arm)   Pulse (!) 55   Resp 18   Ht 5' (1.524 m)   Wt 126 lb 6.4 oz (57.3 kg)   BMI 24.69 kg/m  Gen:  WD/WN, NAD Head: [place]/AT, No temporalis wasting. Ear/Nose/Throat: Hearing grossly intact, nares w/o erythema or drainage Eyes: Conjunctiva clear. Sclera non-icteric Neck: Supple.  Trachea midline Pulmonary:  Good air movement, no use of accessory muscles.  Cardiac: RRR, no JVD Vascular:  Vessel Right Left  Radial Palpable Palpable           Musculoskeletal: M/S 5/5 throughout.  No deformity or atrophy.  1+ right lower extremity edema, 2+ left lower extremity edema. Neurologic: Sensation grossly intact in extremities.  Symmetrical.  Speech is fluent.  Psychiatric: Judgment intact, Mood & affect appropriate for pt's clinical situation. Dermatologic: No rashes or ulcers noted.  No cellulitis or open  wounds.      Labs Recent Results (from the past 2160 hour(s))  TSH     Status: None   Collection Time: 01/18/23  8:15 AM  Result Value Ref Range   TSH 1.32 0.35 - 5.50 uIU/mL  CBC with Differential/Platelet     Status: Abnormal   Collection Time: 01/18/23  8:15 AM  Result Value Ref Range   WBC 11.4 (H) 4.0 - 10.5 K/uL   RBC 3.77 (L) 3.87 - 5.11 Mil/uL   Hemoglobin 12.1 12.0 - 15.0 g/dL   HCT 36.6 44.0 - 34.7 %   MCV 99.1 78.0 - 100.0 fl   MCHC 32.4 30.0 - 36.0 g/dL   RDW 42.5 (H) 95.6 - 38.7 %   Platelets 161.0 150.0 - 400.0 K/uL   Neutrophils Relative % 71.8 43.0 - 77.0 %   Lymphocytes Relative 18.8 12.0 - 46.0 %   Monocytes Relative 5.3 3.0 - 12.0 %   Eosinophils Relative 3.0 0.0 - 5.0 %   Basophils Relative 1.1 0.0 - 3.0 %   Neutro Abs 8.2 (H) 1.4 - 7.7 K/uL   Lymphs Abs 2.1 0.7 - 4.0 K/uL   Monocytes Absolute 0.6 0.1 - 1.0 K/uL   Eosinophils Absolute 0.3 0.0 - 0.7 K/uL   Basophils Absolute 0.1 0.0 - 0.1 K/uL  Comprehensive metabolic panel     Status: Abnormal   Collection Time: 01/18/23  8:15 AM  Result Value Ref Range   Sodium 138 135 - 145 mEq/L   Potassium 4.3 3.5 - 5.1 mEq/L   Chloride 103 96 - 112 mEq/L   CO2 27 19 - 32 mEq/L   Glucose, Bld 96 70 - 99 mg/dL   BUN 25 (H) 6 - 23 mg/dL   Creatinine, Ser 5.64 0.40 -  1.20 mg/dL   Total Bilirubin 0.5 0.2 - 1.2 mg/dL   Alkaline Phosphatase 66 39 - 117 U/L   AST 20 0 - 37 U/L   ALT 11 0 - 35 U/L   Total Protein 6.1 6.0 - 8.3 g/dL   Albumin 3.8 3.5 - 5.2 g/dL   GFR 40.98 (L) >11.91 mL/min    Comment: Calculated using the CKD-EPI Creatinine Equation (2021)   Calcium 8.8 8.4 - 10.5 mg/dL  Urine Culture     Status: None   Collection Time: 01/18/23  8:15 AM   Specimen: Urine  Result Value Ref Range   MICRO NUMBER: [id number]    SPECIMEN QUALITY: Adequate    Sample Source URINE, CLEAN CATCH    STATUS: FINAL    Result:      Less than 10,000 CFU/mL of single Gram positive organism isolated. No further testing will  be performed. If clinically indicated, recollection using a method to minimize contamination, with prompt transfer to Urine Culture Transport Tube, is recommended.  Urinalysis, Routine w reflex microscopic     Status: Abnormal   Collection Time: 01/18/23  8:15 AM  Result Value Ref Range   Color, Urine YELLOW Yellow;Lt. Yellow;Straw;Dark Yellow;Amber;Green;Red;Brown   APPearance Sl Cloudy (A) Clear;Turbid;Slightly Cloudy;Cloudy   Specific Gravity, Urine 1.020 1.000 - 1.030   pH 7.0 5.0 - 8.0   Total Protein, Urine 100 (A) Negative   Urine Glucose NEGATIVE Negative   Ketones, ur NEGATIVE Negative   Bilirubin Urine NEGATIVE Negative   Hgb urine dipstick TRACE-INTACT (A) Negative   Urobilinogen, UA 0.2 0.0 - 1.0   Leukocytes,Ua NEGATIVE Negative   Nitrite NEGATIVE Negative   WBC, UA 21-50/hpf (A) 0-2/hpf   RBC / HPF 3-6/hpf (A) 0-2/hpf   Squamous Epithelial / HPF Rare(0-4/hpf) Rare(0-4/hpf)   Renal Epithel, UA Rare(0-4/hpf) (A) None  Urinalysis, Routine w reflex microscopic     Status: Abnormal   Collection Time: 01/21/23 11:34 AM  Result Value Ref Range   Color, Urine YELLOW Yellow;Lt. Yellow;Straw;Dark Yellow;Amber;Green;Red;Brown   APPearance Sl Cloudy (A) Clear;Turbid;Slightly Cloudy;Cloudy   Specific Gravity, Urine 1.020 1.000 - 1.030   pH 7.0 5.0 - 8.0   Total Protein, Urine 100 (A) Negative   Urine Glucose NEGATIVE Negative   Ketones, ur NEGATIVE Negative   Bilirubin Urine NEGATIVE Negative   Hgb urine dipstick NEGATIVE Negative   Urobilinogen, UA 0.2 0.0 - 1.0   Leukocytes,Ua NEGATIVE Negative   Nitrite NEGATIVE Negative   WBC, UA 11-20/hpf (A) 0-2/hpf   RBC / HPF 0-2/hpf 0-2/hpf   Mucus, UA Presence of (A) None   Squamous Epithelial / HPF Rare(0-4/hpf) Rare(0-4/hpf)   Renal Epithel, UA Rare(0-4/hpf) (A) None   Hyaline Casts, UA Presence of (A) None  Urine Culture     Status: None   Collection Time: 01/21/23 11:34 AM   Specimen: Urine  Result Value Ref Range   MICRO  NUMBER: [id number]    SPECIMEN QUALITY: Adequate    Sample Source URINE    STATUS: FINAL    Result:      Mixed genital flora isolated. These superficial bacteria are not indicative of a urinary tract infection. No further organism identification is warranted on this specimen. If clinically indicated, recollect clean-catch, mid-stream urine and transfer  immediately to Urine Culture Transport Tube.   CBC with Differential     Status: Abnormal   Collection Time: 01/29/23 11:27 PM  Result Value Ref Range   WBC 14.6 (H) 4.0 - 10.5 K/uL  RBC 4.10 3.87 - 5.11 MIL/uL   Hemoglobin 12.9 12.0 - 15.0 g/dL   HCT 16.1 09.6 - 04.5 %   MCV 98.0 80.0 - 100.0 fL   MCH 31.5 26.0 - 34.0 pg   MCHC 32.1 30.0 - 36.0 g/dL   RDW 40.9 (H) 81.1 - 91.4 %   Platelets 145 (L) 150 - 400 K/uL   nRBC 0.1 0.0 - 0.2 %   Neutrophils Relative % 75 %   Neutro Abs 10.9 (H) 1.7 - 7.7 K/uL   Lymphocytes Relative 15 %   Lymphs Abs 2.2 0.7 - 4.0 K/uL   Monocytes Relative 5 %   Monocytes Absolute 0.8 0.1 - 1.0 K/uL   Eosinophils Relative 3 %   Eosinophils Absolute 0.4 0.0 - 0.5 K/uL   Basophils Relative 1 %   Basophils Absolute 0.1 0.0 - 0.1 K/uL   Immature Granulocytes 1 %   Abs Immature Granulocytes 0.11 (H) 0.00 - 0.07 K/uL    Comment: Performed at [facility], [address], [place], [place] [address]  Basic metabolic panel     Status: Abnormal   Collection Time: 01/29/23 11:27 PM  Result Value Ref Range   Sodium 135 135 - 145 mmol/L   Potassium 3.5 3.5 - 5.1 mmol/L   Chloride 103 98 - 111 mmol/L   CO2 21 (L) 22 - 32 mmol/L   Glucose, Bld 128 (H) 70 - 99 mg/dL    Comment: Glucose reference range applies only to samples taken after fasting for at least 8 hours.   BUN 23 8 - 23 mg/dL   Creatinine, Ser 6.21 0.44 - 1.00 mg/dL   Calcium 9.0 8.9 - 30.8 mg/dL   GFR, Estimated >65 >78 mL/min    Comment: (NOTE) Calculated using the CKD-EPI Creatinine Equation (2021)    Anion gap 11 5 - 15    Comment:  Performed at [facility], [address] [place], [place] [address]  Troponin I (High Sensitivity)     Status: None   Collection Time: 01/29/23 11:27 PM  Result Value Ref Range   Troponin I (High Sensitivity) 15 <18 ng/L    Comment: (NOTE) Elevated high sensitivity troponin I (hsTnI) values and significant  changes across serial measurements may suggest ACS but many other  chronic and acute conditions are known to elevate hsTnI results.  Refer to the "Links" section for chest pain algorithms and additional  guidance. Performed at [facility], [address] [place], [place] [address]   Urinalysis, Routine w reflex microscopic -Urine, Clean Catch     Status: Abnormal   Collection Time: 01/29/23 11:27 PM  Result Value Ref Range   Color, Urine COLORLESS (A) YELLOW   APPearance HAZY (A) CLEAR   Specific Gravity, Urine 1.005 1.005 - 1.030   pH 7.0 5.0 - 8.0   Glucose, UA NEGATIVE NEGATIVE mg/dL   Hgb urine dipstick LARGE (A) NEGATIVE   Bilirubin Urine NEGATIVE NEGATIVE   Ketones, ur NEGATIVE NEGATIVE mg/dL   Protein, ur 30 (A) NEGATIVE mg/dL   Nitrite NEGATIVE NEGATIVE   Leukocytes,Ua NEGATIVE NEGATIVE    Comment: Performed at [facility], [address] [place], [place] [address]  Hepatic function panel     Status: Abnormal   Collection Time: 01/29/23 11:27 PM  Result Value Ref Range   Total Protein 6.8 6.5 - 8.1 g/dL   Albumin 3.8 3.5 - 5.0 g/dL   AST 23 15 - 41 U/L   ALT 15  0 - 44 U/L   Alkaline Phosphatase 56 38 - 126 U/L   Total Bilirubin 1.1 0.3 - 1.2 mg/dL   Bilirubin, Direct 0.1 0.0 - 0.2 mg/dL   Indirect Bilirubin 1.0 (H) 0.3 - 0.9 mg/dL    Comment: Performed at [facility], [address] [place], [place] [address]  Lipase, blood     Status: None   Collection Time: 01/29/23 11:27 PM  Result Value Ref Range   Lipase 30 11 - 51 U/L    Comment: Performed at [facility], [address] [place], [place] [address]   T4, free     Status: Abnormal   Collection Time: 03/22/23  3:27 PM  Result Value Ref Range   Free T4 1.38 (H) 0.61 - 1.12 ng/dL    Comment: (NOTE) Biotin ingestion may interfere with free T4 tests. If the results are inconsistent with the TSH level, previous test results, or the clinical presentation, then consider biotin interference. If needed, order repeat testing after stopping biotin. Performed at [facility], [address] [place], [place] [address]   TSH     Status: None   Collection Time: 03/22/23  3:27 PM  Result Value Ref Range   TSH 0.770 0.350 - 4.500 uIU/mL    Comment: Performed by a 3rd Generation assay with a functional sensitivity of <=0.01 uIU/mL. Performed at [facility], [address] [place], [place] [address]   Comprehensive metabolic panel     Status: Abnormal   Collection Time: 03/22/23  3:27 PM  Result Value Ref Range   Sodium 139 135 - 145 mmol/L   Potassium 4.0 3.5 - 5.1 mmol/L   Chloride 106 98 - 111 mmol/L   CO2 26 22 - 32 mmol/L   Glucose, Bld 113 (H) 70 - 99 mg/dL    Comment: Glucose reference range applies only to samples taken after fasting for at least 8 hours.   BUN 31 (H) 8 - 23 mg/dL   Creatinine, Ser 2.70 0.44 - 1.00 mg/dL   Calcium 9.0 8.9 - 62.3 mg/dL   Total Protein 6.0 (L) 6.5 - 8.1 g/dL   Albumin 3.4 (L) 3.5 - 5.0 g/dL   AST 26 15 - 41 U/L   ALT 19 0 - 44 U/L   Alkaline Phosphatase 67 38 - 126 U/L   Total Bilirubin 0.8 0.3 - 1.2 mg/dL   GFR, Estimated >76 >28 mL/min    Comment: (NOTE) Calculated using the CKD-EPI Creatinine Equation (2021)    Anion gap 7 5 - 15    Comment: Performed at [facility], [address], [place], [place] [address]    Radiology No results found.  Assessment/Plan Essential hypertension blood pressure control important in reducing the progression of atherosclerotic disease. On appropriate oral medications.     Mixed hyperlipidemia lipid control important in  reducing the progression of atherosclerotic disease.   Varicose veins of bilateral lower extremities with other complications Her swelling is relatively stable at this point.  She should continue wearing her compression socks, elevating her legs, being as active as possible.  Her multiple other issues most notably her cardiac issues are likely a major contributing factor.  Her left leg is the more severely affected side, but symptoms are fairly stable with conservative therapies.  She does have some venous disease on that side, but I do not think that intervention is likely to be of great benefit.  Follow-up in 6 months.    Barbara Cower  Wyn Quaker, MD  04/09/2023 2:38 PM    This note was created with Dragon medical transcription system.  Any errors from dictation are purely unintentional

## 2023-05-03 ENCOUNTER — Ambulatory Visit (INDEPENDENT_AMBULATORY_CARE_PROVIDER_SITE_OTHER): Payer: Medicare Other | Admitting: *Deleted

## 2023-05-03 DIAGNOSIS — M810 Age-related osteoporosis without current pathological fracture: Secondary | ICD-10-CM | POA: Diagnosis not present

## 2023-05-03 MED ORDER — DENOSUMAB 60 MG/ML ~~LOC~~ SOSY
60.0000 mg | PREFILLED_SYRINGE | Freq: Once | SUBCUTANEOUS | Status: AC
Start: 2023-05-03 — End: 2023-05-03
  Administered 2023-05-03: 60 mg via SUBCUTANEOUS

## 2023-05-03 NOTE — Progress Notes (Signed)
Pt received Prolia injection in right arm (subcutaneous). Pt tolerated it well with no complaints or concerns.

## 2023-05-09 ENCOUNTER — Other Ambulatory Visit: Payer: Self-pay

## 2023-05-09 ENCOUNTER — Emergency Department: Payer: Medicare Other

## 2023-05-09 ENCOUNTER — Emergency Department
Admission: EM | Admit: 2023-05-09 | Discharge: 2023-05-09 | Disposition: A | Discharge status: Home / Self Care | Payer: Medicare Other | Attending: Emergency Medicine | Admitting: Emergency Medicine

## 2023-05-09 DIAGNOSIS — N189 Chronic kidney disease, unspecified: Secondary | ICD-10-CM | POA: Diagnosis not present

## 2023-05-09 DIAGNOSIS — M542 Cervicalgia: Secondary | ICD-10-CM | POA: Diagnosis not present

## 2023-05-09 DIAGNOSIS — S81811A Laceration without foreign body, right lower leg, initial encounter: Secondary | ICD-10-CM | POA: Diagnosis not present

## 2023-05-09 DIAGNOSIS — W01198A Fall on same level from slipping, tripping and stumbling with subsequent striking against other object, initial encounter: Secondary | ICD-10-CM | POA: Diagnosis not present

## 2023-05-09 DIAGNOSIS — I129 Hypertensive chronic kidney disease with stage 1 through stage 4 chronic kidney disease, or unspecified chronic kidney disease: Secondary | ICD-10-CM | POA: Insufficient documentation

## 2023-05-09 DIAGNOSIS — S61412A Laceration without foreign body of left hand, initial encounter: Secondary | ICD-10-CM | POA: Diagnosis not present

## 2023-05-09 DIAGNOSIS — S0101XA Laceration without foreign body of scalp, initial encounter: Secondary | ICD-10-CM | POA: Diagnosis not present

## 2023-05-09 DIAGNOSIS — Y92000 Kitchen of unspecified non-institutional (private) residence as  the place of occurrence of the external cause: Secondary | ICD-10-CM | POA: Diagnosis not present

## 2023-05-09 DIAGNOSIS — S0990XA Unspecified injury of head, initial encounter: Secondary | ICD-10-CM | POA: Diagnosis present

## 2023-05-09 DIAGNOSIS — R799 Abnormal finding of blood chemistry, unspecified: Secondary | ICD-10-CM | POA: Insufficient documentation

## 2023-05-09 DIAGNOSIS — W19XXXA Unspecified fall, initial encounter: Secondary | ICD-10-CM

## 2023-05-09 DIAGNOSIS — S12120S Other displaced dens fracture, sequela: Secondary | ICD-10-CM

## 2023-05-09 DIAGNOSIS — T148XXA Other injury of unspecified body region, initial encounter: Secondary | ICD-10-CM

## 2023-05-09 LAB — URINALYSIS, ROUTINE W REFLEX MICROSCOPIC
Bacteria, UA: NONE SEEN
Bilirubin Urine: NEGATIVE
Glucose, UA: NEGATIVE mg/dL
Ketones, ur: NEGATIVE mg/dL
Nitrite: NEGATIVE
Protein, ur: 30 mg/dL — AB
RBC / HPF: >50 RBC/hpf (ref 0–5)
Specific Gravity, Urine: 1.018 (ref 1.005–1.030)
pH: 6 (ref 5.0–8.0)

## 2023-05-09 LAB — BASIC METABOLIC PANEL
Anion gap: 7 (ref 5–15)
BUN: 26 mg/dL — ABNORMAL HIGH (ref 8–23)
CO2: 23 mmol/L (ref 22–32)
Calcium: 8.7 mg/dL — ABNORMAL LOW (ref 8.9–10.3)
Chloride: 106 mmol/L (ref 98–111)
Creatinine, Ser: 0.88 mg/dL (ref 0.44–1.00)
GFR, Estimated: >60 mL/min (ref >60)
Glucose, Bld: 151 mg/dL — ABNORMAL HIGH (ref 70–99)
Potassium: 3.9 mmol/L (ref 3.5–5.1)
Sodium: 136 mmol/L (ref 135–145)

## 2023-05-09 LAB — CBC
HCT: 35.7 % — ABNORMAL LOW (ref 36.0–46.0)
Hemoglobin: 11.4 g/dL — ABNORMAL LOW (ref 12.0–15.0)
MCH: 31.8 pg (ref 26.0–34.0)
MCHC: 31.9 g/dL (ref 30.0–36.0)
MCV: 99.7 fL (ref 80.0–100.0)
Platelets: 155 10*3/uL (ref 150–400)
RBC: 3.58 MIL/uL — ABNORMAL LOW (ref 3.87–5.11)
RDW: 21.2 % — ABNORMAL HIGH (ref 11.5–15.5)
WBC: 12.1 10*3/uL — ABNORMAL HIGH (ref 4.0–10.5)
nRBC: 0.5 % — ABNORMAL HIGH (ref 0.0–0.2)

## 2023-05-09 NOTE — ED Triage Notes (Signed)
Pt arrives via POV from [facility] s/p fall in kitchen hitting her head, arm, and leg. Pt reports she slipped on water she thinks. Pt presents with several wrapped areas, kerlix noted to L hand with skin tear reported, kerlix to head, kerlix and coban to RLE. Pt denies LOC, denies thinners. Pt pleasant in triage.

## 2023-05-09 NOTE — ED Notes (Signed)
Pt taken to bathroom. Pt missed the hat during her attempt to collect specimen. Will try again.

## 2023-05-09 NOTE — ED Provider Notes (Signed)
-----------------------------------------   8:39 PM on 05/09/2023 -----------------------------------------  Blood pressure (!) 149/64, pulse (!) 53, temperature 97.7 F (36.5 C), temperature source Oral, resp. rate 17, height 5\' 1"  (1.549 m), weight 57.6 kg, SpO2 97%.  Assuming care from [facility], PA-C.  In short, Brenda Cardenas is a 89 y.o. female with a chief complaint of Fall .  Refer to the original H&P for additional details.  The current plan of care is to await MRI.  Patient had a mechanical fall that resulted in a distraction of a type II dens fracture.  It appears that there is now a gap of 3 mm compared to previously at 1 mm.  Previous provider consult neurosurgery who evaluated the patient's results and advises that they recommend pursuing an MRI.  If MRI is reassuring with no disruption of the transverse ligament then patient may be discharged with collar and follow-up with neurosurgery.  Will consult to neurosurgery if this ligament is disrupted.  Patient is neurologically intact at this time.  Awaiting MRI results.  Slight widening of the space of the dens fracture though no other acute injury identified on MRI.  Patient is placed into a [place] J hard collar and is instructed to follow-up with neurosurgery.    ED diagnosis:  Worsening dens fracture of C2 vertebrae  Fall Multiple skin tears    Lanette Hampshire 05/09/23 2249    Pilar Jarvis, MD 05/10/23 1740

## 2023-05-09 NOTE — Discharge Instructions (Addendum)
A skin tear is a wound in which the top layers of skin peel off. This is a common problem for older people. It can also be a problem for people who take certain medicines for too long.  keep the wound dry for the first few days.  It is dressed with xeroform to promote healing. Change any bandages. This includes changing the bandage if it gets wet, gets dirty, or starts to smell bad. Do not take baths, swim, use a hot tub, or do anything that puts your wound underwater.

## 2023-05-09 NOTE — Progress Notes (Signed)
Neurosurgery brief note  Was contacted by the emergency room regarding this patient.  By available information she apparently arrives after having a fall in her kitchen where she hit her head arm and leg.  She said she slipped on water she believes.  She denies any loss of consciousness not currently on any blood thinners. By report of the emergency room she is neurologically intact  She has a known history by data available to me in the record of a type II dens fracture chronic that at the time with subacute appearing partially resorbed nondisplaced.  CT scan imaging performed today demonstrates  IMPRESSION: 1. No acute intracranial abnormality. 2. Chronic nonunionized type 2 dens fracture that appears slightly more distracted compared to prior with a gap of 3 mm (from 1 mm). Associated new 2 mm translocation of the C2 vertebra to the left in comparison to the dens fracture fragment. Recommend MRI cervical spine for evaluation of the ligaments. 3. Otherwise no new acute displaced fracture or traumatic listhesis of the cervical spine.     Electronically Signed   By: Tish Frederickson M.D.   On: 05/09/2023 19:14    AP: Overall the patient has a known type II dens fracture that was identified back in February 2024 however on the more recent CT scan performed the emergency room today after her slip and fall there appears to be a slightly widened gap at the dens body interface from 3 mm to 1 mm and a slight trancelike location of 2 mm relative the stents fragment. I have recommended his MRI of the cervical spine without contrast be performed.  If the transverse ligament is intact having the patient be placed in a hard cervical orthoses and discharged from the emergency room from a neurosurgical standpoint with plans for follow-up in the neurosurgery clinic which we will arrange  Peter Garter. Madaline Brilliant, MD Neurosurgery

## 2023-05-09 NOTE — ED Provider Notes (Signed)
[facility] Emergency Department Provider Note     Event Date/Time   First MD Initiated Contact with Patient 05/09/23 1621     (approximate)   History   Fall   HPI  Brenda Cardenas is a 89 y.o. female with a history of HLD, CKD and HTN presents ti the ED following a fall. Patient reports she slipped on water and fell, but also mentions she believes her Zoloft might make her sleepy, and she may have fallen asleep at the table before falling hitting her head sustaining as small scalp laceration to the back of her head. Denies LOC and anticoagulation use. The fall also resulted in multiple skin tears, which are wrapped in coban at time of presentation. Associated symptoms include neck pain.     Physical Exam   Triage Vital Signs: ED Triage Vitals  Encounter Vitals Group     BP 05/09/23 1609 (!) 153/56     Systolic BP Percentile --      Diastolic BP Percentile --      Pulse Rate 05/09/23 1609 (!) 59     Resp 05/09/23 1609 20     Temp 05/09/23 1609 97.7 F (36.5 C)     Temp Source 05/09/23 1609 Oral     SpO2 05/09/23 1609 100 %     Weight 05/09/23 1610 127 lb (57.6 kg)     Height 05/09/23 1610 5\' 1"  (1.549 m)     Head Circumference --      Peak Flow --      Pain Score 05/09/23 1610 8     Pain Loc --      Pain Education --      Exclude from Growth Chart --     Most recent vital signs: Vitals:   05/09/23 1609 05/09/23 2030  BP: (!) 153/56 (!) 149/64  Pulse: (!) 59 (!) 53  Resp: 20 17  Temp: 97.7 F (36.5 C)   SpO2: 100% 97%    General: Alert and oriented. INAD. Pleasant. Answering questions appropriately. Speaking in complete sentences.  Skin:  Warm, dry and intact. No rashes or lesions noted.     Head:  ~ 2cm laceration to posterior left scalp region. No signs of skull depression.  Eyes:  PERRLA. EOMI.  Ears:  No postauricular ecchymosis.    Neck:   Mild cervical spine tenderness to palpation. Limited ROM due to discomfort.  CV:  Good  peripheral perfusion. RRR.   RESP:  Normal effort. LCTAB.   MSK:   Left hand reveals skin tear with missing skin, fragile skin edges, active bleeding. unable to be sutured.   Right lower leg reveals skin tear with missing skin and moderate ecchymosis.unable to be sutured.      NEURO: Cranial nerves II-XII intact. No focal deficits. Sensation and motor function intact. Patient is ambulatory with assistance. Baseline ambulation with walker.   ED Results / Procedures / Treatments   Labs (all labs ordered are listed, but only abnormal results are displayed) Labs Reviewed  BASIC METABOLIC PANEL - Abnormal; Notable for the following components:      Result Value   Glucose, Bld 151 (*)    BUN 26 (*)    Calcium 8.7 (*)    All other components within normal limits  CBC - Abnormal; Notable for the following components:   WBC 12.1 (*)    RBC 3.58 (*)    Hemoglobin 11.4 (*)    HCT 35.7 (*)  RDW 21.2 (*)    nRBC 0.5 (*)    All other components within normal limits  URINALYSIS, ROUTINE W REFLEX MICROSCOPIC - Abnormal; Notable for the following components:   Color, Urine YELLOW (*)    APPearance CLEAR (*)    Hgb urine dipstick MODERATE (*)    Protein, ur 30 (*)    Leukocytes,Ua TRACE (*)    All other components within normal limits  CBG MONITORING, ED   RADIOLOGY  I personally viewed and evaluated these images as part of my medical decision making, as well as reviewing the written report by the radiologist.  ED Provider Interpretation:   CT head - No intracranial hemorrhage, infarct.  CT cervical - appears normal will reevaluate with final radiologist read.  Left hand xray - unremarkable  Right tib/fib xray - unremarkable  MRI - pending   MR Cervical Spine Wo Contrast  Result Date: 05/09/2023 CLINICAL DATA:  Neck pain.  Chronic dens fracture EXAM: MRI CERVICAL SPINE WITHOUT CONTRAST TECHNIQUE: Multiplanar, multisequence MR imaging of the cervical spine was performed. No  intravenous contrast was administered. COMPARISON:  CT 05/09/2023, 11/09/2022 FINDINGS: Alignment: Grade 1 anterolisthesis at C3-4, C4-5, and C7-T1. Vertebrae: Minimally displaced type 2 fracture of the odontoid process of C2. There is bone marrow edema within the dens and C2 vertebral body, likely indicative of abnormal motion across the fracture site. Prominent marrow edema associated with the C1-C2 articulations, worse on the left, and favored degenerative. There is also degenerative subchondral marrow edema associated with the right C2-3 facet joint. No evidence of discitis. Scattered intraosseous hemangiomas. No suspicious bone lesion. Cord: Normal signal and morphology. Posterior Fossa, vertebral arteries, paraspinal tissues: Negative. Disc levels: C2-C3: No disc protrusion. Bilateral facet arthropathy. No foraminal or canal stenosis. C3-C4: Facet and uncovertebral spurring result in mild left foraminal stenosis. No canal stenosis. C4-C5: Disc uncovering with right greater than left facet and uncovertebral arthropathy. Mild-to-moderate right foraminal stenosis. No canal stenosis. C5-C6: Facet and uncovertebral arthropathy resulting in mild bilateral foraminal stenosis. No canal stenosis. C6-C7: Disc osteophyte complex with bilateral facet and uncovertebral arthropathy. Mild bilateral foraminal stenosis. No significant canal stenosis. C7-T1: Left greater than right facet arthropathy. No significant foraminal or canal stenosis. IMPRESSION: 1. Non-acute minimally displaced type 2 fracture of the odontoid process of C2. There is bone marrow edema within the dens and C2 vertebral body, likely indicative of abnormal motion across the fracture site. 2. Prominent marrow edema associated with the C1-C2 articulations, worse on the left, and favored degenerative. 3. Multilevel cervical spondylosis, as described above. No significant canal stenosis at any level. Mild-to-moderate right foraminal stenosis at C4-5.  Electronically Signed   By: Duanne Guess D.O.   On: 05/09/2023 21:34   CT Head Wo Contrast  Result Date: 05/09/2023 CLINICAL DATA:  Head trauma, minor (Age >= 65y); Neck pain, acute, no red flags EXAM: CT HEAD WITHOUT CONTRAST CT CERVICAL SPINE WITHOUT CONTRAST TECHNIQUE: Multidetector CT imaging of the head and cervical spine was performed following the standard protocol without intravenous contrast. Multiplanar CT image reconstructions of the cervical spine were also generated. RADIATION DOSE REDUCTION: This exam was performed according to the departmental dose-optimization program which includes automated exposure control, adjustment of the mA and/or kV according to patient size and/or use of iterative reconstruction technique. COMPARISON:  None Available. FINDINGS: CT HEAD FINDINGS Brain: Stable prominence of the lateral ventricles may be related to central predominant atrophy, although a component of normal pressure/communicating hydrocephalus cannot be excluded. Patchy and  confluent areas of decreased attenuation are noted throughout the deep and periventricular white matter of the cerebral hemispheres bilaterally, compatible with chronic microvascular ischemic disease. No evidence of large-territorial acute infarction. No parenchymal hemorrhage. No mass lesion. No extra-axial collection. No mass effect or midline shift. No hydrocephalus. Basilar cisterns are patent. Vascular: No hyperdense vessel. Atherosclerotic calcifications are present within the cavernous internal carotid arteries. Skull: No acute fracture or focal lesion. Sinuses/Orbits: Paranasal sinuses and mastoid air cells are clear. Bilateral lens replacement. Otherwise the orbits are unremarkable. Other: Trace left parietal scalp hematoma formation foci of gas. Overlying skin laceration. No retained radiopaque foreign body. CT CERVICAL SPINE FINDINGS Alignment: Stable grade 1 anterolisthesis of C2 on C3, C3 on C4, C4 on C5. Skull base and  vertebrae: Chronic nonunionized type 2 dens fracture that appears slightly more distracted compared to prior with a gap of 3 mm (from 1 mm). Associated new 2 mm translocation of the C2 vertebra to the left in comparison to the dens fracture fragment. Multilevel severe degenerative changes of the spine with associated multilevel severe osseous neural foraminal stenosis. No severe osseous central canal stenosis. No aggressive appearing focal osseous lesion or focal pathologic process. Soft tissues and spinal canal: No prevertebral fluid or swelling. No visible canal hematoma. Upper chest: Unremarkable. Other: None. IMPRESSION: 1. No acute intracranial abnormality. 2. Chronic nonunionized type 2 dens fracture that appears slightly more distracted compared to prior with a gap of 3 mm (from 1 mm). Associated new 2 mm translocation of the C2 vertebra to the left in comparison to the dens fracture fragment. Recommend MRI cervical spine for evaluation of the ligaments. 3. Otherwise no new acute displaced fracture or traumatic listhesis of the cervical spine. Electronically Signed   By: Tish Frederickson M.D.   On: 05/09/2023 19:14   CT Cervical Spine Wo Contrast  Result Date: 05/09/2023 CLINICAL DATA:  Head trauma, minor (Age >= 65y); Neck pain, acute, no red flags EXAM: CT HEAD WITHOUT CONTRAST CT CERVICAL SPINE WITHOUT CONTRAST TECHNIQUE: Multidetector CT imaging of the head and cervical spine was performed following the standard protocol without intravenous contrast. Multiplanar CT image reconstructions of the cervical spine were also generated. RADIATION DOSE REDUCTION: This exam was performed according to the departmental dose-optimization program which includes automated exposure control, adjustment of the mA and/or kV according to patient size and/or use of iterative reconstruction technique. COMPARISON:  None Available. FINDINGS: CT HEAD FINDINGS Brain: Stable prominence of the lateral ventricles may be related to  central predominant atrophy, although a component of normal pressure/communicating hydrocephalus cannot be excluded. Patchy and confluent areas of decreased attenuation are noted throughout the deep and periventricular white matter of the cerebral hemispheres bilaterally, compatible with chronic microvascular ischemic disease. No evidence of large-territorial acute infarction. No parenchymal hemorrhage. No mass lesion. No extra-axial collection. No mass effect or midline shift. No hydrocephalus. Basilar cisterns are patent. Vascular: No hyperdense vessel. Atherosclerotic calcifications are present within the cavernous internal carotid arteries. Skull: No acute fracture or focal lesion. Sinuses/Orbits: Paranasal sinuses and mastoid air cells are clear. Bilateral lens replacement. Otherwise the orbits are unremarkable. Other: Trace left parietal scalp hematoma formation foci of gas. Overlying skin laceration. No retained radiopaque foreign body. CT CERVICAL SPINE FINDINGS Alignment: Stable grade 1 anterolisthesis of C2 on C3, C3 on C4, C4 on C5. Skull base and vertebrae: Chronic nonunionized type 2 dens fracture that appears slightly more distracted compared to prior with a gap of 3 mm (from 1 mm). Associated  new 2 mm translocation of the C2 vertebra to the left in comparison to the dens fracture fragment. Multilevel severe degenerative changes of the spine with associated multilevel severe osseous neural foraminal stenosis. No severe osseous central canal stenosis. No aggressive appearing focal osseous lesion or focal pathologic process. Soft tissues and spinal canal: No prevertebral fluid or swelling. No visible canal hematoma. Upper chest: Unremarkable. Other: None. IMPRESSION: 1. No acute intracranial abnormality. 2. Chronic nonunionized type 2 dens fracture that appears slightly more distracted compared to prior with a gap of 3 mm (from 1 mm). Associated new 2 mm translocation of the C2 vertebra to the left in  comparison to the dens fracture fragment. Recommend MRI cervical spine for evaluation of the ligaments. 3. Otherwise no new acute displaced fracture or traumatic listhesis of the cervical spine. Electronically Signed   By: Tish Frederickson M.D.   On: 05/09/2023 19:14   DG Hand Complete Left  Result Date: 05/09/2023 CLINICAL DATA:  Fall in kitchen today.  Left hand injury and pain. EXAM: LEFT HAND - COMPLETE 3+ VIEW COMPARISON:  None Available. FINDINGS: There is no evidence of fracture or dislocation. Severe osteoarthritis is seen involving the D IP joints of the 2nd through 5th fingers and the base of the thumb. Mild osteoarthritis involving the interphalangeal joint of the thumb. Prominent chondrocalcinosis seen involving the triangular fibrocartilage. IMPRESSION: No acute findings. Polyarticular osteoarthritis. Electronically Signed   By: Danae Orleans M.D.   On: 05/09/2023 18:49   DG Tibia/Fibula Right  Result Date: 05/09/2023 CLINICAL DATA:  Fall in kitchen today.  Right leg injury and pain. EXAM: RIGHT TIBIA AND FIBULA - 2 VIEW COMPARISON:  None Available. FINDINGS: There is no evidence of fracture or other focal bone lesions. Total right knee arthroplasty noted. IMPRESSION: Negative. Electronically Signed   By: Danae Orleans M.D.   On: 05/09/2023 18:47    PROCEDURES:  Critical Care performed: No  ..Laceration Repair  Date/Time: 05/09/2023 10:54 PM  Performed by: Conrad Klawock, PA-C Authorized by: Conrad Snoqualmie, PA-C   Consent:    Consent obtained:  Verbal   Consent given by:  Patient   Risks discussed:  Infection, pain, poor cosmetic result and poor wound healing Anesthesia:    Anesthesia method:  None Laceration details:    Location:  Scalp   Scalp location:  L parietal   Length (cm):  2   Depth (mm):  1 Treatment:    Area cleansed with:  Chlorhexidine   Amount of cleaning:  Extensive   Irrigation solution:  Sterile saline Skin repair:    Repair method:  Staples    Number of staples:  1 Post-procedure details:    Dressing:  Open (no dressing)   Procedure completion:  Tolerated  MEDICATIONS ORDERED IN ED: Medications - No data to display  IMPRESSION / MDM / ASSESSMENT AND PLAN / ED COURSE  I reviewed the triage vital signs and the nursing notes.                              Clinical Course as of 05/09/23 2232  Wynelle Link May 09, 2023  1952 Patient is on keflex for urine symptoms  [MH]  1959 Wound care performed. Skin of skin tear flattened. There is skin missing from central wound to approximate edges to close.  Dressed with xeroform and gauze wrap w/ coban wrap for compression.  1 stable in posterior scalp [MH]  2000 U/A unchanged from 08/08 appt with urology  [MH]    Clinical Course User Index [MH] Kern Reap A, PA-C    89 y.o. female presents to the emergency department for evaluation and treatment of acute mechanical fall sustaining multiple skin tears. See HPI for further details.   Differential diagnosis includes, but is not limited to fracture, intracranial hemorrhage, laceration, skin tear, concussion.   Head Injury Small posterior scalp laceration CT head scan --> unremarkable  Staple repair (1) - Noted Lidocaine allergy. See procedure note  Recommend return in 7 days for staple removal  Skin tears: Multiple skin tears on left hand and right lower leg. Unable to suture due to missing skin.  Wounds are extensively cleaned and dressed with Xeroform and bandaged with compression. Neurovascularly intact before and following dressing application.  Wound care instructions provided.   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  Neck pain, limited ROM due to discomfort. CT cervical --> recommend MRI Neurosurgery consulted  Discussed with Madaline Brilliant, MD for further management with recommendation of MRI cervical w/o contrast. If transverse ligament is intact then patient can be discharged with hard  cervical collar and clinic follow up next week.  Gala Romney, PA-C will assume patient care pending MRI results. -  21:34  Medication adverse affects Advised to follow up with PCP to discuss adverse effects of zoloft medication.   Patient's presentation is most consistent with acute presentation with potential threat to life or bodily function.  Extensive chart review performed.   FINAL CLINICAL IMPRESSION(S) / ED DIAGNOSES   Final diagnoses:  Fall, initial encounter  Neck pain  Multiple skin tears    Rx / DC Orders   ED Discharge Orders     None        Note:  This document was prepared using Dragon voice recognition software and may include unintentional dictation errors.    Romeo Apple,  A, PA-C 05/09/23 2257    Pilar Jarvis, MD 05/10/23 [id number]

## 2023-05-09 NOTE — ED Notes (Signed)
Pt placed in a [place] J collar.

## 2023-05-10 ENCOUNTER — Telehealth: Payer: Self-pay

## 2023-05-10 NOTE — Telephone Encounter (Signed)
The ER called Dr Madaline Brilliant about this patient on 05/09/23.  Per Dr Madaline Brilliant: "[id number] slip and fall in kitchen, head/arm strike no LOC.  Known history of Type 2 subacute dens fracture from February 2024, not in collar currently.  CT C spine now with slightly more displacement of dens (3mm from 1 mm) with chronic healing changes.Neuro intact Recommended MRI C spine to assess ligaments which seem intact just showing chronic osseous changes from the chronic type 2 dens, no canal compromise  AP:  Was discharged from ED after MRI cleared. C collar till followup  Will need clinic followup"

## 2023-05-10 NOTE — Telephone Encounter (Signed)
Please schedule a new patient appointment with Dr Katrinka Blazing or Dr Myer Haff for cervical fracture. Will need xrays that day.

## 2023-05-17 ENCOUNTER — Telehealth: Payer: Self-pay

## 2023-05-17 NOTE — Telephone Encounter (Signed)
Transition Care Management Unsuccessful Follow-up Telephone Call  Date of discharge and from where:  [place] 8/11  Attempts:  2nd Attempt  Reason for unsuccessful TCM follow-up call:  No answer/busy   Lenard Forth [facility] Guide, [facility] [phone] [address] [place], [place], [place] [address] Phone: [phone] Email: [email or web address]

## 2023-05-17 NOTE — Telephone Encounter (Signed)
Transition Care Management Unsuccessful Follow-up Telephone Call  Date of discharge and from where:  [place] 8/10  Attempts:  1st Attempt  Reason for unsuccessful TCM follow-up call:  No answer/busy   Lenard Forth [facility] Guide, [facility] [phone] [address] [place], [place], [place] [address] Phone: [phone] Email: [email or web address]

## 2023-05-21 ENCOUNTER — Other Ambulatory Visit: Payer: Self-pay

## 2023-05-21 ENCOUNTER — Other Ambulatory Visit: Payer: Self-pay | Admitting: Family Medicine

## 2023-05-21 ENCOUNTER — Inpatient Hospital Stay
Admission: RE | Admit: 2023-05-21 | Discharge: 2023-05-21 | Disposition: A | Discharge status: Home / Self Care | Payer: Self-pay | Source: Ambulatory Visit | Attending: Neurosurgery | Admitting: Neurosurgery

## 2023-05-21 DIAGNOSIS — Z049 Encounter for examination and observation for unspecified reason: Secondary | ICD-10-CM

## 2023-05-21 DIAGNOSIS — S12100S Unspecified displaced fracture of second cervical vertebra, sequela: Secondary | ICD-10-CM

## 2023-05-21 NOTE — Progress Notes (Signed)
Pt needs flex-extension xrays prior to her appointment on 05/25/23 per discussion with Dr Myer Haff. Order has been placed.

## 2023-05-24 ENCOUNTER — Ambulatory Visit
Admission: RE | Admit: 2023-05-24 | Discharge: 2023-05-24 | Disposition: A | Discharge status: Home / Self Care | Payer: Medicare Other | Source: Ambulatory Visit | Attending: Neurosurgery | Admitting: Neurosurgery

## 2023-05-24 DIAGNOSIS — S12100S Unspecified displaced fracture of second cervical vertebra, sequela: Secondary | ICD-10-CM | POA: Insufficient documentation

## 2023-05-24 DIAGNOSIS — S12100A Unspecified displaced fracture of second cervical vertebra, initial encounter for closed fracture: Secondary | ICD-10-CM | POA: Insufficient documentation

## 2023-05-24 DIAGNOSIS — M50322 Other cervical disc degeneration at C5-C6 level: Secondary | ICD-10-CM | POA: Insufficient documentation

## 2023-05-24 NOTE — Progress Notes (Unsigned)
Referring Physician:  Sherlene Shams, MD [address] [place],  [place] [address]  Primary Physician:  Sherlene Shams, MD  History of Present Illness: 05/25/2023 [name] is here today with a chief complaint of fall that resulted in an emergency department visit.  She was seen at the emergency department a couple weeks ago.  She has previously been seen for falls.  She lives in independent living.  She denies any new neurologic deficits at this time.  She has no pain currently.  Her brace is driving her crazy and irritated her skin.  Review of Systems:  A 10 point review of systems is negative, except for the pertinent positives and negatives detailed in the HPI.  Past Medical History: Past Medical History:  Diagnosis Date   Arthritis    Basal cell carcinoma 06/04/2010   left upper back   Chronic kidney disease    Colon polyps    Gastritis    GERD (gastroesophageal reflux disease)    Hiatal hernia    Hyperlipidemia    Hypertension    IBS (irritable bowel syndrome)    Low back pain radiating to left leg 03/28/2020   Sciatica, left side 04/15/2020   Squamous cell carcinoma of skin 06/26/2013   left mid pretibial   Squamous cell carcinoma of skin 08/21/2015   lower sternum   Squamous cell carcinoma of skin 10/14/2017   right mid pretibial   Squamous cell carcinoma of skin 03/17/2018   right prox lateral elbow   Squamous cell carcinoma of skin 09/07/2018   right post thigh   Squamous cell carcinoma of skin 05/11/2019   right mid med pretibial   Squamous cell carcinoma of skin 06/20/2019   right bicep   Supraventricular tachycardia     Past Surgical History: Past Surgical History:  Procedure Laterality Date   APPENDECTOMY  1959   CARPAL TUNNEL RELEASE     CATARACT EXTRACTION W/ INTRAOCULAR LENS IMPLANT Left    CATARACT EXTRACTION W/PHACO Right 04/20/2018   Procedure: CATARACT EXTRACTION PHACO AND INTRAOCULAR LENS PLACEMENT (IOC)  RIGHT;   Surgeon: Lockie Mola, MD;  Location: [facility];  Service: Ophthalmology;  Laterality: Right;   CHOLECYSTECTOMY  1995   COLONOSCOPY WITH PROPOFOL N/A 07/30/2022   Procedure: COLONOSCOPY WITH PROPOFOL;  Surgeon: Toney Reil, MD;  Location: [facility];  Service: Gastroenterology;  Laterality: N/A;   EYE SURGERY     GANGLION CYST EXCISION     JOINT REPLACEMENT Right 03/2008   [place]    KNEE ARTHROSCOPY     LITHOTRIPSY     SALIVARY GLAND SURGERY     SKIN CANCER EXCISION     TONSILLECTOMY     TONSILLECTOMY AND ADENOIDECTOMY     VEIN LIGATION      Allergies: Allergies as of 05/25/2023 - Review Complete 05/25/2023  Allergen Reaction Noted   Ciprofloxacin Diarrhea 12/22/2017   Clindamycin/lincomycin Diarrhea 06/03/2016   Sulfa antibiotics Rash 05/02/2013   Augmentin [amoxicillin-pot clavulanate] Diarrhea 05/02/2013   Gabapentin Other (See Comments) 12/01/2021   Lidocaine  07/01/2022   Naproxen sodium Other (See Comments) 11/13/2015   Prednisone  08/13/2021   Tramadol Diarrhea 06/13/2015   Codeine Nausea Only and Rash 05/02/2013   Doxycycline hyclate Nausea And Vomiting and Nausea Only 06/13/2015   E-mycin [erythromycin] Rash 05/02/2013   Macrobid [nitrofurantoin] Rash 05/02/2013   Nabumetone Rash and Other (See Comments) 05/02/2013   Nitrofurantoin monohyd macro Rash 06/13/2015   Nsaids Rash 06/13/2015  Phenobarbital Other (See Comments), Rash, and Anxiety 05/02/2013   Statins Rash 06/13/2015    Medications:  Current Outpatient Medications:    acetaminophen (TYLENOL) 325 MG tablet, Take 650 mg by mouth every 6 (six) hours as needed., Disp: , Rfl:    amiodarone (PACERONE) 200 MG tablet, Take 1 tablet (200 mg total) by mouth daily., Disp: 90 tablet, Rfl: 3   cephALEXin (KEFLEX) 250 MG capsule, Take 250 mg by mouth daily., Disp: , Rfl:    Cholecalciferol (VITAMIN D) 50 MCG (2000 UT) CAPS, Take 1 capsule by mouth every other day., Disp: , Rfl:     clobetasol ointment (TEMOVATE) 0.05 %, Apply 1 Application topically 2 (two) times daily., Disp: 60 g, Rfl: 1   CRANBERRY PO, Take 2 capsules by mouth daily. , Disp: , Rfl:    estradiol (ESTRACE) 0.1 MG/GM vaginal cream, 1 Applicatorful every other day., Disp: , Rfl:    ezetimibe (ZETIA) 10 MG tablet, Take 1 tablet (10 mg total) by mouth daily., Disp: 90 tablet, Rfl: 3   furosemide (LASIX) 20 MG tablet, Take 0.5 tablets (10 mg total) by mouth 2 (two) times a week. Take 1-2 tablets per week as needed for swelling, Disp: 30 tablet, Rfl: 0   hyoscyamine (ANASPAZ) 0.125 MG TBDP disintergrating tablet, Place 1 tablet (0.125 mg total) under the tongue every 6 (six) hours as needed (as needed for esophageal spasm)., Disp: 30 tablet, Rfl: 0   metoprolol succinate (TOPROL XL) 25 MG 24 hr tablet, Take 1 tablet (25 mg total) by mouth daily. May also take 0.5 tablets (12.5 mg total) as needed (tachycardia, may repeat x 2 for a total of 3 pills)., Disp: 90 tablet, Rfl: 3   mirabegron ER (MYRBETRIQ) 25 MG TB24 tablet, Take 1 tablet by mouth daily., Disp: , Rfl:    omeprazole (PRILOSEC) 20 MG capsule, TAKE 1 CAPSULE BY MOUTH EVERY DAY, Disp: 90 capsule, Rfl: 3   Probiotic Product (ALIGN PO), Take 1 tablet by mouth daily. , Disp: , Rfl:    sertraline (ZOLOFT) 50 MG tablet, Take 1 tablet (50 mg total) by mouth daily., Disp: 90 tablet, Rfl: 2   triamcinolone ointment (KENALOG) 0.1 %, Apply topically to affected area nightly x 4 weeks until symptoms resolve, then apply nightly as needed when symptomatic, Disp: , Rfl:   Social History: Social History   Tobacco Use   Smoking status: Never   Smokeless tobacco: Never  Vaping Use   Vaping status: Never Used  Substance Use Topics   Alcohol use: No    Alcohol/week: 0.0 standard drinks of alcohol   Drug use: No    Family Medical History: Family History  Problem Relation Age of Onset   Heart disease Mother    Arthritis Mother    Diabetes Father     Physical  Examination: Vitals:   05/25/23 1402  BP: 132/76    General: Patient is in no apparent distress. Attention to examination is appropriate.  Neck:   Supple.  Limited rotation  Respiratory: Patient is breathing without any difficulty.   NEUROLOGICAL:     Awake, alert, oriented to person, place, and time.  Speech is clear and fluent.   Cranial Nerves: Pupils equal round and reactive to light.  Facial tone is symmetric.  Facial sensation is symmetric. Shoulder shrug is symmetric. Tongue protrusion is midline.    Strength: Side Biceps Triceps Deltoid Interossei Grip Wrist Ext. Wrist Flex.  R 5 5 5 5 5 5 5   L 5  5 5 5 5 5 5    Side Iliopsoas Quads Hamstring PF DF EHL  R 5 5 5 5 5 5   L 5 5 5 5 5 5    Reflexes are 1+ and symmetric at the biceps, triceps, brachioradialis, patella and achilles.   Hoffman's is absent.   Bilateral upper and lower extremity sensation is intact to light touch.    No evidence of dysmetria noted.  Gait is abnormal - requires walker.     Medical Decision Making  Imaging: MRI C spine 05/09/2023 IMPRESSION: 1. Non-acute minimally displaced type 2 fracture of the odontoid process of C2. There is bone marrow edema within the dens and C2 vertebral body, likely indicative of abnormal motion across the fracture site. 2. Prominent marrow edema associated with the C1-C2 articulations, worse on the left, and favored degenerative. 3. Multilevel cervical spondylosis, as described above. No significant canal stenosis at any level. Mild-to-moderate right foraminal stenosis at C4-5.     Electronically Signed   By: Duanne Guess D.O.   On: 05/09/2023 21:34  I have personally reviewed the images and agree with the above interpretation.  Assessment and Plan: Ms. Goleman is a pleasant 89 y.o. female with chronic C2 fracture.  On my review of her flexion-extension x-rays today, there is only 1 mm of motion.  At this point, I think the risks of maintaining a collar  given her skin breakdown is higher than the risks of walking without a collar.  As such, I recommended that she discontinue her collar.  We discussed the utility of a soft collar.  At this point, I do not recommend that.  We discussed that there is still some risk of spinal cord injury if she were to follow-up in the trauma.  She expressed understanding of that possibility.  I will see her back on an as-needed basis.    Thank you for involving me in the care of this patient.      Natalye Kott K. Myer Haff MD, [facility] Neurosurgery

## 2023-05-25 ENCOUNTER — Ambulatory Visit (INDEPENDENT_AMBULATORY_CARE_PROVIDER_SITE_OTHER): Payer: Medicare Other | Admitting: Neurosurgery

## 2023-05-25 ENCOUNTER — Encounter: Payer: Self-pay | Admitting: Neurosurgery

## 2023-05-25 VITALS — BP 132/76 | Ht 61.0 in | Wt 126.0 lb

## 2023-05-25 DIAGNOSIS — S12100A Unspecified displaced fracture of second cervical vertebra, initial encounter for closed fracture: Secondary | ICD-10-CM | POA: Diagnosis not present

## 2023-05-25 DIAGNOSIS — W19XXXA Unspecified fall, initial encounter: Secondary | ICD-10-CM

## 2023-05-25 DIAGNOSIS — S12100S Unspecified displaced fracture of second cervical vertebra, sequela: Secondary | ICD-10-CM

## 2023-06-05 ENCOUNTER — Emergency Department: Payer: Medicare Other

## 2023-06-05 ENCOUNTER — Emergency Department
Admission: EM | Admit: 2023-06-05 | Discharge: 2023-06-05 | Disposition: A | Discharge status: Home / Self Care | Payer: Medicare Other | Attending: Emergency Medicine | Admitting: Emergency Medicine

## 2023-06-05 ENCOUNTER — Other Ambulatory Visit: Payer: Self-pay

## 2023-06-05 DIAGNOSIS — S300XXA Contusion of lower back and pelvis, initial encounter: Secondary | ICD-10-CM | POA: Diagnosis not present

## 2023-06-05 DIAGNOSIS — Y92002 Bathroom of unspecified non-institutional (private) residence single-family (private) house as the place of occurrence of the external cause: Secondary | ICD-10-CM | POA: Insufficient documentation

## 2023-06-05 DIAGNOSIS — S8992XA Unspecified injury of left lower leg, initial encounter: Secondary | ICD-10-CM | POA: Diagnosis present

## 2023-06-05 DIAGNOSIS — S81812A Laceration without foreign body, left lower leg, initial encounter: Secondary | ICD-10-CM | POA: Insufficient documentation

## 2023-06-05 DIAGNOSIS — W19XXXA Unspecified fall, initial encounter: Secondary | ICD-10-CM | POA: Diagnosis not present

## 2023-06-05 NOTE — ED Provider Notes (Signed)
[facility] Provider Note    Event Date/Time   First MD Initiated Contact with Patient 06/05/23 [phone]     (approximate)   History   Fall   HPI Brenda Cardenas is a 89 y.o. female who presents from [facility] for evaluation after a fall.  She reports that she was unsteady in the bathroom and fell backwards, landing on her lower back and tailbone.  She did not strike her head or lose consciousness.  She has no pain in her head or neck but she has some swelling and pain in her lower back, tailbone, and a skin tear to her left lower leg.  She is already being treated for some skin tears in the right lower leg.  She said that the swelling has already improved in her lower back but she just wanted to make sure that there are no broken bones.  She has no new numbness nor tingling and no injuries to her arms.     Physical Exam   Triage Vital Signs: ED Triage Vitals  Encounter Vitals Group     BP 06/05/23 0231 (!) 170/92     Systolic BP Percentile --      Diastolic BP Percentile --      Pulse Rate 06/05/23 0231 60     Resp 06/05/23 0231 18     Temp 06/05/23 0231 97.8 F (36.6 C)     Temp Source 06/05/23 0231 Oral     SpO2 06/05/23 0231 100 %     Weight 06/05/23 0230 57.8 kg (127 lb 6.4 oz)     Height --      Head Circumference --      Peak Flow --      Pain Score 06/05/23 0232 10     Pain Loc --      Pain Education --      Exclude from Growth Chart --     Most recent vital signs: Vitals:   06/05/23 0231  BP: (!) 170/92  Pulse: 60  Resp: 18  Temp: 97.8 F (36.6 C)  SpO2: 100%    General: Awake, no distress.  CV:  Good peripheral perfusion.  Resp:  Normal effort. Speaking easily and comfortably, no accessory muscle usage nor intercostal retractions.   Abd:  No distention.   Other:  Patient has swelling to the lower back consistent with contusion but no bony tenderness to palpation even on the tailbone.  She has a moderate-sized skin tear that is  relatively superficial but with some missing tissue on the left lateral lower leg.  She requests wound care rather than trying to match up the skin border and placing Steri-Strips.  No peripheral edema.  No gross deformities.   ED Results / Procedures / Treatments   Labs (all labs ordered are listed, but only abnormal results are displayed) Labs Reviewed - No data to display   RADIOLOGY I viewed and interpreted the patient's x-rays of the sacrum/coccyx and lumbar spine.  See hospital course for details.   PROCEDURES:  Critical Care performed: No  Procedures    IMPRESSION / MDM / ASSESSMENT AND PLAN / ED COURSE  I reviewed the triage vital signs and the nursing notes.                              Differential diagnosis includes, but is not limited to, fracture, dislocation, contusion, hematoma,   Patient's presentation is  most consistent with acute presentation with potential threat to life or bodily function.  Labs/studies ordered: Lumbar spine x-rays and sacrum/coccyx x-rays  Interventions/Medications given:  Medications - No data to display  (Note:  hospital course my include additional interventions and/or labs/studies not listed above.)   Superficial skin tear to left lateral lower leg, patient is very adamant that it be treated with wound care rather than trying to match at the borders, so that is how we proceeded.  Doubt fracture given that the patient has no tenderness to palpation of the spine even though she has a fairly large hematoma to the lower lumbar spine.  She is not on blood thinners.  She is in no distress and is eager to go home.  I will wait for the x-rays and then provide reassurance or additional management recommendations.     Clinical Course as of 06/05/23 7829  Sat Jun 05, 2023  5621 I viewed and interpreted the patient's x-rays of the sacrum/coccyx and lumbar spine.  No evidence of acute fracture.  Radiology reports agree.  Patient is  comfortable with the plan for discharge home. [CF]  [phone] Patient has a representative from [facility] here to help get her home and will help facilitate getting in touch with the appropriate necessary services. [CF]    Clinical Course User Index [CF] Loleta Rose, MD     FINAL CLINICAL IMPRESSION(S) / ED DIAGNOSES   Final diagnoses:  Fall, initial encounter  Contusion of lower back, initial encounter  Coccyx contusion, initial encounter  Skin tear of left lower leg without complication, initial encounter     Rx / DC Orders   ED Discharge Orders     None        Note:  This document was prepared using Dragon voice recognition software and may include unintentional dictation errors.   Loleta Rose, MD 06/05/23 [phone]

## 2023-06-05 NOTE — ED Triage Notes (Signed)
Pt is coming from [facility] after she tripped and fell hitting her sacral and lower back on a base board tonight. Pt c/o lower back and sacral pain.

## 2023-06-05 NOTE — Discharge Instructions (Signed)
You have been seen in the Emergency Department (ED) today for a fall.  Your work up does not show any concerning injuries.  Please take over-the-counter ibuprofen and/or Tylenol as needed for your pain (unless you have an allergy or your doctor as told you not to take them), or take any prescribed medication as instructed.  You can use ice on your lower back where it is swollen and painful, as well as on your tailbone if that is also painful.  You may consider getting a donut shaped pillow to sit on to take the pressure off of the tailbone.  Please continue to care for the skin tear on your left leg just as you have been doing for the right leg.  Please follow up with your doctor regarding today's Emergency Department (ED) visit and your recent fall.    Return to the ED if you have any headache, confusion, slurred speech, weakness/numbness of any arm or leg, or any increased pain.

## 2023-06-07 NOTE — Group Note (Deleted)

## 2023-07-02 ENCOUNTER — Telehealth: Payer: Self-pay

## 2023-07-02 NOTE — Telephone Encounter (Signed)
Transition Care Management Unsuccessful Follow-up Telephone Call  Date of discharge and from where:  [place] 9/7  Attempts:  1st Attempt  Reason for unsuccessful TCM follow-up call:  No answer/busy

## 2023-07-05 ENCOUNTER — Telehealth: Payer: Self-pay

## 2023-07-05 NOTE — Telephone Encounter (Signed)
Transition Care Management Unsuccessful Follow-up Telephone Call  Date of discharge and from where:  [facility] 9/7  Attempts:  2nd Attempt  Reason for unsuccessful TCM follow-up call:  No answer/busy   Lenard Forth Gerald  [facility], [facility] Guide, Phone: [phone] Website: [email or web address]

## 2023-07-28 ENCOUNTER — Encounter: Payer: Self-pay | Admitting: Internal Medicine

## 2023-07-28 ENCOUNTER — Ambulatory Visit: Payer: Medicare Other | Attending: Internal Medicine | Admitting: Internal Medicine

## 2023-07-28 VITALS — BP 128/70 | HR 55 | Ht 60.0 in | Wt 119.8 lb

## 2023-07-28 DIAGNOSIS — I471 Supraventricular tachycardia, unspecified: Secondary | ICD-10-CM | POA: Diagnosis present

## 2023-07-28 DIAGNOSIS — R600 Localized edema: Secondary | ICD-10-CM | POA: Diagnosis present

## 2023-07-28 DIAGNOSIS — R102 Pelvic and perineal pain: Secondary | ICD-10-CM | POA: Insufficient documentation

## 2023-07-28 DIAGNOSIS — R32 Unspecified urinary incontinence: Secondary | ICD-10-CM | POA: Insufficient documentation

## 2023-07-28 DIAGNOSIS — I10 Essential (primary) hypertension: Secondary | ICD-10-CM | POA: Insufficient documentation

## 2023-07-28 NOTE — Progress Notes (Signed)
Cardiology Office Note:  .   Date:  07/28/2023  ID:  Brenda Cardenas, DOB 1932/07/06, MRN [id number] PCP: Brenda Shams, MD  [facility] Providers Cardiologist:  Yvonne Kendall, MD Electrophysiologist:  Brenda Prude, MD     History of Present Illness: .   Brenda Cardenas is a 89 y.o. female with history of SVT, HTN, HLD, GERD, IBS, arthritis, and low back pain, who presents for follow-up of SVT.  I last saw her in 08/2022 following ED visit 4 days earlier, where she was noted to have SVT.  She was initially placed on diltiazem but ultimately discharged on metoprolol and amiodarone.  She denied further palpitations at our last visit.  She subsequently followed up with Dr. Lalla Cardenas (EP), who recommended continuation of amiodarone.  She was seen in late June by Brenda Don, NP, for EP follow-up, which time she noted continued falls due to left knee arthritis.  She was noted to be significantly more edematous in the left leg.  She was continued on amiodarone and metoprolol and encouraged to take as needed furosemide for leg edema.  Today, Brenda Cardenas reports that she has been doing well from a heart standpoint.  She denies chest pain, shortness of breath, palpitations, and lightheadedness.  She continues to fall frequently due to arthritis in the left knee as well as spinal stenosis and scoliosis.  She has multiple abrasions on both legs that are being managed at the clinic at [facility].  Her other complaints are primarily related to urologic and gynecologic matters including continued urinary incontinence and pelvic pain that she attributes to lichen sclerosis.  She was prescribed Myrbetriq by her urologist but stopped it after a month due to diarrhea and concerns in the accompanying paperwork that it could interact with metoprolol and lead to higher heart rates.  She continues to have intermittent diarrhea.  Her incontinence was a little bit better when taking Myrbetriq.  ROS: See  HPI  Studies Reviewed: Marland Kitchen   ECG declined today, as electrode adhesive irritates her thin skin.  Risk Assessment/Calculations:             Physical Exam:   VS:  BP 128/70 (BP Location: Left Arm, Cuff Size: Normal)   Pulse (!) 55   Ht 5' (1.524 m)   Wt 119 lb 12.8 oz (54.3 kg)   SpO2 98%   BMI 23.40 kg/m    Wt Readings from Last 3 Encounters:  07/28/23 119 lb 12.8 oz (54.3 kg)  06/05/23 127 lb 6.4 oz (57.8 kg)  05/25/23 126 lb (57.2 kg)    General:  NAD. Neck: No JVD or HJR. Lungs: Clear to auscultation bilaterally without wheezes or crackles. Heart: Regular rate and rhythm with without murmurs, rubs or gallops. Abdomen: Soft, nontender, nondistended. Extremities: Trace right pretibial edema.  Left calf wrapped with ACE bandage with underlying 1+ edema.  ASSESSMENT AND PLAN: .    Supraventricular tachycardia: Symptoms well-controlled with current combination of metoprolol and amiodarone.  Defer medication changes today.  Continue ongoing follow-up with electrophysiology.  Urinary incontinence and pelvic pain: Issues have been present for a few years.  I advised Brenda Cardenas that she could try Myrbetriq from a heart standpoint and that we would adjust her cardiac medications if needed for recurrent tachycardia.  She should follow-up with her urologist and gynecologist for ongoing management.  Hypertension: Initial blood pressure mildly elevated, improved on repeat.  No medication changes today.  Leg edema: This has been a chronic  issue.  Unfortunately, Brenda Cardenas reluctance to use furosemide due to her urinary incontinence is contributing to her leg swelling.  I encouraged her to use it if needed and elevate her legs when possible.  Continue compression wraps and close wound follow-up through her team at [facility] as well as vascular surgery.    Dispo: Continue follow-up with Dr. Lalla Cardenas as scheduled in December.  Return to see me as needed.  Signed, Yvonne Kendall, MD

## 2023-07-28 NOTE — Patient Instructions (Signed)
Medication Instructions:  No changes *If you need a refill on your cardiac medications before your next appointment, please call your pharmacy*   Lab Work: None ordered If you have labs (blood work) drawn today and your tests are completely normal, you will receive your results only by: MyChart Message (if you have MyChart) OR A paper copy in the mail If you have any lab test that is abnormal or we need to change your treatment, we will call you to review the results.   Testing/Procedures: None ordered   Follow-Up: At [facility], you and your health needs are our priority.  As part of our continuing mission to provide you with exceptional heart care, we have created designated Provider Care Teams.  These Care Teams include your primary Cardiologist (physician) and Advanced Practice Providers (APPs -  Physician Assistants and Nurse Practitioners) who all work together to provide you with the care you need, when you need it.  We recommend signing up for the patient portal called "MyChart".  Sign up information is provided on this After Visit Summary.  MyChart is used to connect with patients for Virtual Visits (Telemedicine).  Patients are able to view lab/test results, encounter notes, upcoming appointments, etc.  Non-urgent messages can be sent to your provider as well.   To learn more about what you can do with MyChart, go to [email or web address].    Your next appointment:   Follow up with Dr. Okey Dupre and needed Keep your follow up with Dr. Lalla Brothers as scheduled

## 2023-08-02 ENCOUNTER — Encounter: Payer: Self-pay | Admitting: Internal Medicine

## 2023-08-03 ENCOUNTER — Telehealth: Payer: Self-pay

## 2023-08-03 DIAGNOSIS — I10 Essential (primary) hypertension: Secondary | ICD-10-CM

## 2023-08-03 DIAGNOSIS — E782 Mixed hyperlipidemia: Secondary | ICD-10-CM

## 2023-08-03 DIAGNOSIS — R5383 Other fatigue: Secondary | ICD-10-CM

## 2023-08-03 DIAGNOSIS — R7301 Impaired fasting glucose: Secondary | ICD-10-CM

## 2023-08-03 DIAGNOSIS — D649 Anemia, unspecified: Secondary | ICD-10-CM

## 2023-08-03 NOTE — Telephone Encounter (Signed)
I have pended labs for your approval.

## 2023-08-03 NOTE — Telephone Encounter (Signed)
See telephone encounter.

## 2023-08-13 ENCOUNTER — Ambulatory Visit
Admission: EM | Admit: 2023-08-13 | Discharge: 2023-08-13 | Disposition: A | Discharge status: Home / Self Care | Payer: Medicare Other | Attending: Emergency Medicine | Admitting: Emergency Medicine

## 2023-08-13 DIAGNOSIS — L97519 Non-pressure chronic ulcer of other part of right foot with unspecified severity: Secondary | ICD-10-CM

## 2023-08-13 DIAGNOSIS — L03115 Cellulitis of right lower limb: Secondary | ICD-10-CM

## 2023-08-13 MED ORDER — CEPHALEXIN 500 MG PO CAPS: 500.0000 mg | ORAL_CAPSULE | Freq: Three times a day (TID) | ORAL | 0 refills | Status: AC

## 2023-08-13 NOTE — Discharge Instructions (Addendum)
Take the Keflex as directed.  Follow up with your primary care provider on Monday.   ? ?

## 2023-08-13 NOTE — ED Provider Notes (Signed)
Brenda Cardenas    CSN: [id number] Arrival date & time: 08/13/23  1410      History   Chief Complaint Chief Complaint  Patient presents with   Foot Pain    HPI Brenda Cardenas is a 89 y.o. female.  Patient presents with painful sore on right second toe with surrounding redness and swelling.  No trauma.  Patient states she noted the sore and redness 3 days ago; she says it started out as a corn.  She lives at [facility] and sees the nurse care 3 days a week.  She states the nurse wrapped it with Xeroform and a bandage.  She denies fever.  The history is provided by the patient and medical records.    Past Medical History:  Diagnosis Date   Arthritis    Basal cell carcinoma 06/04/2010   left upper back   Chronic kidney disease    Colon polyps    Gastritis    GERD (gastroesophageal reflux disease)    Hiatal hernia    Hyperlipidemia    Hypertension    IBS (irritable bowel syndrome)    Low back pain radiating to left leg 03/28/2020   Sciatica, left side 04/15/2020   Squamous cell carcinoma of skin 06/26/2013   left mid pretibial   Squamous cell carcinoma of skin 08/21/2015   lower sternum   Squamous cell carcinoma of skin 10/14/2017   right mid pretibial   Squamous cell carcinoma of skin 03/17/2018   right prox lateral elbow   Squamous cell carcinoma of skin 09/07/2018   right post thigh   Squamous cell carcinoma of skin 05/11/2019   right mid med pretibial   Squamous cell carcinoma of skin 06/20/2019   right bicep   Supraventricular tachycardia (HCC)     Patient Active Problem List   Diagnosis Date Noted   Contraindication to statin medication 01/21/2023   Lymphedema 01/21/2023   Dysuria 01/07/2023   Other nondisplaced dens fracture, sequela 11/16/2022   Closed rib fracture 11/16/2022   History of fracture due to fall 11/16/2022   Hospital discharge follow-up 09/14/2022   GERD without esophagitis 08/30/2022   Hypokalemia 08/30/2022   Dyslipidemia  08/30/2022   Leg edema 08/30/2022   Polyp of descending colon 07/30/2022   COVID-19 07/02/2022   Anemia 05/31/2022   Palpitations 05/31/2022   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/2022   SVT (supraventricular tachycardia) (HCC) 08/13/2021   Supraventricular tachycardia (HCC) 08/13/2021   GAD (generalized anxiety disorder) 11/05/2020   Left ventricular diastolic dysfunction 11/04/2020   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   Aortic atherosclerosis (HCC) 07/08/2020   Primary osteoarthritis of left knee 04/21/2020   Sciatica, left side 04/15/2020   BPPV (benign paroxysmal positional vertigo) 03/14/2019   Educated about COVID-19 virus infection 03/14/2019   Mixed hyperlipidemia 06/20/2018   Varicose veins of bilateral lower extremities with other complications 02/18/2018   Constipation 07/14/2016   Hypovitaminosis D 06/23/2016   Venous stasis of both lower extremities 06/23/2016   Essential hypertension 12/24/2015   History of colonic polyps 12/24/2015   Spinal stenosis of lumbar region 08/13/2015   Idiopathic scoliosis 06/24/2015   Rhinitis due food 12/17/2014   Osteoarthritis of cervical spine 02/25/2014   Osteoporosis 06/18/2013   Encounter for Medicare annual wellness exam 06/18/2013   Hiatal hernia 06/16/2013   Diaphragmatic hernia 06/16/2013   GERD (gastroesophageal reflux disease) 05/02/2013   S/P total knee replacement 05/02/2013   Artificial knee joint present 05/02/2013   History of urinary stone  11/03/2012    Past Surgical History:  Procedure Laterality Date   APPENDECTOMY  1959   CARPAL TUNNEL RELEASE     CATARACT EXTRACTION W/ INTRAOCULAR LENS IMPLANT Left    CATARACT EXTRACTION W/PHACO Right 04/20/2018   Procedure: CATARACT EXTRACTION PHACO AND INTRAOCULAR LENS PLACEMENT (IOC)  RIGHT;  Surgeon: Lockie Mola, MD;  Location: [facility];  Service: Ophthalmology;  Laterality: Right;   CHOLECYSTECTOMY  1995   COLONOSCOPY WITH PROPOFOL N/A  07/30/2022   Procedure: COLONOSCOPY WITH PROPOFOL;  Surgeon: Toney Reil, MD;  Location: [facility];  Service: Gastroenterology;  Laterality: N/A;   EYE SURGERY     GANGLION CYST EXCISION     JOINT REPLACEMENT Right 03/2008   [place]    KNEE ARTHROSCOPY     LITHOTRIPSY     SALIVARY GLAND SURGERY     SKIN CANCER EXCISION     TONSILLECTOMY     TONSILLECTOMY AND ADENOIDECTOMY     VEIN LIGATION      OB History   No obstetric history on file.      Home Medications    Prior to Admission medications   Medication Sig Start Date End Date Taking? Authorizing Provider  cephALEXin (KEFLEX) 500 MG capsule Take 1 capsule (500 mg total) by mouth 3 (three) times daily for 7 days. 08/13/23 08/20/23 Yes Mickie Bail, NP  acetaminophen (TYLENOL) 325 MG tablet Take 650 mg by mouth every 6 (six) hours as needed.    [provider]  amiodarone (PACERONE) 200 MG tablet Take 1 tablet (200 mg total) by mouth daily. 01/21/23   Sherlene Shams, MD  Cholecalciferol (VITAMIN D) 50 MCG (2000 UT) CAPS Take 1 capsule by mouth every other day.    [provider]  clobetasol ointment (TEMOVATE) 0.05 % Apply 1 Application topically 2 (two) times daily. 05/13/22   Sherlene Shams, MD  CRANBERRY PO Take 2 capsules by mouth daily.     [provider]  estradiol (ESTRACE) 0.1 MG/GM vaginal cream 1 Applicatorful every other day.    [provider]  ezetimibe (ZETIA) 10 MG tablet Take 1 tablet (10 mg total) by mouth daily. 01/21/23   Sherlene Shams, MD  furosemide (LASIX) 20 MG tablet Take 0.5 tablets (10 mg total) by mouth 2 (two) times a week. Take 1-2 tablets per week as needed for swelling 01/21/23   Sherlene Shams, MD  hyoscyamine (ANASPAZ) 0.125 MG TBDP disintergrating tablet Place 1 tablet (0.125 mg total) under the tongue every 6 (six) hours as needed (as needed for esophageal spasm). 01/11/20   Sherlene Shams, MD  metoprolol succinate (TOPROL XL) 25 MG 24 hr tablet  Take 1 tablet (25 mg total) by mouth daily. May also take 0.5 tablets (12.5 mg total) as needed (tachycardia, may repeat x 2 for a total of 3 pills). 03/26/23   Sherie Don, NP  mirabegron ER (MYRBETRIQ) 25 MG TB24 tablet Take 1 tablet by mouth daily. 05/24/23   [provider]  omeprazole (PRILOSEC) 20 MG capsule TAKE 1 CAPSULE BY MOUTH EVERY DAY 11/16/22   Sherlene Shams, MD  Probiotic Product (ALIGN PO) Take 1 tablet by mouth daily.     [provider]  sertraline (ZOLOFT) 50 MG tablet Take 1 tablet (50 mg total) by mouth daily. 10/12/22   Sherlene Shams, MD  triamcinolone ointment (KENALOG) 0.1 % Apply topically to affected area nightly x 4 weeks until symptoms resolve, then apply nightly as needed  when symptomatic 05/25/23 05/24/24  [provider]    Family History Family History  Problem Relation Age of Onset   Heart disease Mother    Arthritis Mother    Diabetes Father     Social History Social History   Tobacco Use   Smoking status: Never   Smokeless tobacco: Never  Vaping Use   Vaping status: Never Used  Substance Use Topics   Alcohol use: No    Alcohol/week: 0.0 standard drinks of alcohol   Drug use: No     Allergies   Ciprofloxacin, Clindamycin/lincomycin, Sulfa antibiotics, Augmentin [amoxicillin-pot clavulanate], Gabapentin, Lidocaine, Naproxen sodium, Prednisone, Tramadol, Codeine, Doxycycline hyclate, E-mycin [erythromycin], Macrobid [nitrofurantoin], Nabumetone, Nitrofurantoin monohyd macro, Nsaids, Phenobarbital, and Statins   Review of Systems Review of Systems  Constitutional:  Negative for chills and fever.  Musculoskeletal:  Positive for arthralgias and joint swelling.  Skin:  Positive for color change and wound.     Physical Exam Triage Vital Signs ED Triage Vitals [08/13/23 1517]  Encounter Vitals Group     BP      Systolic BP Percentile      Diastolic BP Percentile      Pulse Rate 70     Resp 18     Temp 98 F  (36.7 C)     Temp src      SpO2 96 %     Weight      Height      Head Circumference      Peak Flow      Pain Score      Pain Loc      Pain Education      Exclude from Growth Chart    No data found.  Updated Vital Signs BP 138/88   Pulse 70   Temp 98 F (36.7 C)   Resp 18   SpO2 96%   Visual Acuity Right Eye Distance:   Left Eye Distance:   Bilateral Distance:    Right Eye Near:   Left Eye Near:    Bilateral Near:     Physical Exam Constitutional:      General: She is not in acute distress. HENT:     Mouth/Throat:     Mouth: Mucous membranes are moist.  Cardiovascular:     Rate and Rhythm: Normal rate and regular rhythm.  Pulmonary:     Effort: Pulmonary effort is normal. No respiratory distress.  Musculoskeletal:        General: Swelling and tenderness present. No deformity. Normal range of motion.       Feet:  Feet:     Comments: Erythema from base to tip of right toes, worse on 2nd toe. Skin:    General: Skin is warm and dry.     Capillary Refill: Capillary refill takes less than 2 seconds.     Findings: Erythema and lesion present.  Neurological:     General: No focal deficit present.     Mental Status: She is alert.     Gait: Gait normal.      UC Treatments / Results  Labs (all labs ordered are listed, but only abnormal results are displayed) Labs Reviewed - No data to display  EKG   Radiology No results found.  Procedures Procedures (including critical care time)  Medications Ordered in UC Medications - No data to display  Initial Impression / Assessment and Plan / UC Course  I have reviewed the triage vital signs and the nursing notes.  Pertinent  labs & imaging results that were available during my care of the patient were reviewed by me and considered in my medical decision making (see chart for details).    Cellulitis of right foot, ulcer on right second toe.  Afebrile and vital signs are stable.  Treating her symptoms today  with cephalexin.  Education provided on cellulitis.  Instructed her to follow-up with her PCP on Monday.  ED precautions provided.  Patient agrees to plan of care.  Final Clinical Impressions(s) / UC Diagnoses   Final diagnoses:  Cellulitis of right foot  Ulcer of toe of right foot, unspecified ulcer stage [facility])     Discharge Instructions      Take the Keflex as directed.  Follow-up with your primary care provider on Monday.     ED Prescriptions     Medication Sig Dispense Auth. Provider   cephALEXin (KEFLEX) 500 MG capsule Take 1 capsule (500 mg total) by mouth 3 (three) times daily for 7 days. 21 capsule Mickie Bail, NP      PDMP not reviewed this encounter.   Mickie Bail, NP 08/13/23 1536

## 2023-08-13 NOTE — ED Triage Notes (Signed)
Patient to Urgent Care with complaints of right sided 2nd toe pain. Reports initially she had a corn on her toe but on Wednesday she started experiencing increased pain.   Sees a home health nurse x3 days a week at [facility]. Arrives with bandage/ xeroform in place- has had some bleeding. Denies any kind of injury.

## 2023-08-24 ENCOUNTER — Encounter: Payer: Self-pay | Admitting: Internal Medicine

## 2023-08-24 ENCOUNTER — Ambulatory Visit: Payer: Medicare Other | Admitting: Internal Medicine

## 2023-08-24 VITALS — BP 130/58 | HR 70 | Ht 60.0 in | Wt 120.2 lb

## 2023-08-24 DIAGNOSIS — S12121S Other nondisplaced dens fracture, sequela: Secondary | ICD-10-CM | POA: Diagnosis not present

## 2023-08-24 DIAGNOSIS — I471 Supraventricular tachycardia, unspecified: Secondary | ICD-10-CM

## 2023-08-24 DIAGNOSIS — S12121D Other nondisplaced dens fracture, subsequent encounter for fracture with routine healing: Secondary | ICD-10-CM

## 2023-08-24 DIAGNOSIS — I10 Essential (primary) hypertension: Secondary | ICD-10-CM

## 2023-08-24 DIAGNOSIS — F411 Generalized anxiety disorder: Secondary | ICD-10-CM | POA: Diagnosis not present

## 2023-08-24 DIAGNOSIS — L430 Hypertrophic lichen planus: Secondary | ICD-10-CM | POA: Insufficient documentation

## 2023-08-24 DIAGNOSIS — M810 Age-related osteoporosis without current pathological fracture: Secondary | ICD-10-CM

## 2023-08-24 NOTE — Assessment & Plan Note (Signed)
She reports improved mood and less worrying since starting zoloft  but has reduced dose to 25 mg due to excessive sedation

## 2023-08-24 NOTE — Assessment & Plan Note (Addendum)
Managed with Prolia injections every 6 months / T scores stable/unchanged by 2021.  continue prolia;  Last injection was given August 2024.

## 2023-08-24 NOTE — Patient Instructions (Addendum)
I'm glad your Dr Dalbert Garnet was able to diagnose your itching problem and find a medication that helps!  Regarding your arm pain :  please make sure you are taking  1000 mg tylenol twice daily   You can apply a Heating pad to left arm every  6 hours for 15 minutes    Continue yogurt and Ensure every day to support your weight  Return in February for labs   May  you enjoy this time with family to reflect on God's blessings ,  and may He bless you with health and prosperity in the [place] Year!  Regards,   Duncan Dull, MD

## 2023-08-24 NOTE — Assessment & Plan Note (Signed)
blood pressure control important in reducing the progression of atherosclerotic disease. On appropriate oral medications.readings are labile no changes today

## 2023-08-24 NOTE — Assessment & Plan Note (Signed)
Managed with amiodarone and metoprolol

## 2023-08-24 NOTE — Assessment & Plan Note (Signed)
Diagnosed by Biopsy Dr Dalbert Garnet. Managed with clobetasol  and estrace

## 2023-08-24 NOTE — Assessment & Plan Note (Signed)
Subacute , stable

## 2023-08-24 NOTE — Progress Notes (Signed)
Subjective:  Patient ID: Brenda Cardenas, female    DOB: 08/29/1932  Age: 89 y.o. MRN: [id number]  CC: The primary encounter diagnosis was Other nondisplaced dens fracture, sequela. Diagnoses of GAD (generalized anxiety disorder), SVT (supraventricular tachycardia) (HCC), Essential hypertension, Hypertrophic lichen planus of vulva, and Age related osteoporosis, unspecified pathological fracture presence were also pertinent to this visit.   HPI Brenda Cardenas presents for  Chief Complaint  Patient presents with   Medical Management of Chronic Issues    6 month follow up     1) lichen planus of the vulva :  very symptomatic,  diagnosed on Nov 5 by  Dr  Dalbert Garnet [facility]).  Prescribed a high dose steroid cream and estrogen cream   2) treated for cellulitis of toe by urgent care nov 15 with keflex   3) HTN:  Hypertension: patient checks blood pressure daily at home.  Readings have been for the most part <130/80 at rest . Patient is following a reduced salt diet most days and is taking medications as prescribed alled EMT recently for SBP of 200,  was observed for an hour  no changes made.  Was in pain from her vaginal pain   4) right upper arm pain :  hurts to raise arm.  No history of trauma   6) GAD:  started on zoloft ,  treatment dose caused excessive sedation leading to falls .  Had a c2 ondontoid fracture, had to wear a neck brace for wears.  Still livinign in independent living.  Prefers to stay alone.  Has a caregiver 3 hours on Thursday .  Home has been fitted with grab bars.    Had OT and PT, now released.   still has neck pain .       7) Recurrent UTI:  has had cystoscopy  urology at [facility]  for hematuria.   No cancer  not taking myrbetriq due to diarrhea and fear of effects on pulse    8) weight loss:  reviewed diet .  She prepares her own meals and eats out once a day she drinks an ensure once daily   Outpatient Medications Prior to Visit  Medication Sig Dispense  Refill   acetaminophen (TYLENOL) 325 MG tablet Take 650 mg by mouth every 6 (six) hours as needed.     amiodarone (PACERONE) 200 MG tablet Take 1 tablet (200 mg total) by mouth daily. 90 tablet 3   Cholecalciferol (VITAMIN D) 50 MCG (2000 UT) CAPS Take 1 capsule by mouth every other day.     clobetasol ointment (TEMOVATE) 0.05 % Apply 1 Application topically 2 (two) times daily. 60 g 1   CRANBERRY PO Take 2 capsules by mouth daily.      estradiol (ESTRACE) 0.1 MG/GM vaginal cream 1 Applicatorful every other day.     ezetimibe (ZETIA) 10 MG tablet Take 1 tablet (10 mg total) by mouth daily. 90 tablet 3   hyoscyamine (ANASPAZ) 0.125 MG TBDP disintergrating tablet Place 1 tablet (0.125 mg total) under the tongue every 6 (six) hours as needed (as needed for esophageal spasm). 30 tablet 0   metoprolol succinate (TOPROL XL) 25 MG 24 hr tablet Take 1 tablet (25 mg total) by mouth daily. May also take 0.5 tablets (12.5 mg total) as needed (tachycardia, may repeat x 2 for a total of 3 pills). 90 tablet 3   omeprazole (PRILOSEC) 20 MG capsule TAKE 1 CAPSULE BY MOUTH EVERY DAY 90 capsule 3   Probiotic  Product (ALIGN PO) Take 1 tablet by mouth daily.      sertraline (ZOLOFT) 50 MG tablet Take 1 tablet (50 mg total) by mouth daily. 90 tablet 2   triamcinolone ointment (KENALOG) 0.1 % Apply topically to affected area nightly x 4 weeks until symptoms resolve, then apply nightly as needed when symptomatic     furosemide (LASIX) 20 MG tablet Take 0.5 tablets (10 mg total) by mouth 2 (two) times a week. Take 1-2 tablets per week as needed for swelling (Patient not taking: Reported on 08/24/2023) 30 tablet 0   mirabegron ER (MYRBETRIQ) 25 MG TB24 tablet Take 1 tablet by mouth daily.     No facility-administered medications prior to visit.    Review of Systems;  Patient denies headache, fevers, malaise, unintentional weight loss, skin rash, eye pain, sinus congestion and sinus pain, sore throat, dysphagia,   hemoptysis , cough, dyspnea, wheezing, chest pain, palpitations, orthopnea, edema, abdominal pain, nausea, melena, diarrhea, constipation, flank pain, dysuria, hematuria, urinary  Frequency, nocturia, numbness, tingling, seizures,  Focal weakness, Loss of consciousness,  Tremor, insomnia, depression, anxiety, and suicidal ideation.      Objective:  BP (!) 130/58   Pulse 70   Ht 5' (1.524 m)   Wt 120 lb 3.2 oz (54.5 kg)   SpO2 97%   BMI 23.47 kg/m   BP Readings from Last 3 Encounters:  08/24/23 (!) 130/58  08/13/23 138/88  07/28/23 128/70    Wt Readings from Last 3 Encounters:  08/24/23 120 lb 3.2 oz (54.5 kg)  07/28/23 119 lb 12.8 oz (54.3 kg)  06/05/23 127 lb 6.4 oz (57.8 kg)    Physical Exam Vitals reviewed.  Constitutional:      General: She is not in acute distress.    Appearance: Normal appearance. She is normal weight. She is not ill-appearing, toxic-appearing or diaphoretic.  HENT:     Head: Normocephalic.  Eyes:     General: No scleral icterus.       Right eye: No discharge.        Left eye: No discharge.     Conjunctiva/sclera: Conjunctivae normal.  Cardiovascular:     Rate and Rhythm: Normal rate and regular rhythm.     Heart sounds: Normal heart sounds.  Pulmonary:     Effort: Pulmonary effort is normal. No respiratory distress.     Breath sounds: Normal breath sounds.  Musculoskeletal:        General: Normal range of motion.  Skin:    General: Skin is warm and dry.  Neurological:     General: No focal deficit present.     Mental Status: She is alert and oriented to person, place, and time. Mental status is at baseline.  Psychiatric:        Mood and Affect: Mood normal.        Behavior: Behavior normal.        Thought Content: Thought content normal.        Judgment: Judgment normal.    Lab Results  Component Value Date   HGBA1C 5.4 01/05/2020   HGBA1C 5.5 06/16/2018   HGBA1C 5.7 03/24/2018    Lab Results  Component Value Date   CREATININE  0.88 05/09/2023   CREATININE 0.90 03/22/2023   CREATININE 0.69 01/29/2023    Lab Results  Component Value Date   WBC 12.1 (H) 05/09/2023   HGB 11.4 (L) 05/09/2023   HCT 35.7 (L) 05/09/2023   PLT 155 05/09/2023   GLUCOSE 151 (H)  05/09/2023   CHOL 150 05/28/2022   TRIG 86.0 05/28/2022   HDL 54.00 05/28/2022   LDLDIRECT 103.0 06/16/2017   LDLCALC 79 05/28/2022   ALT 19 03/22/2023   AST 26 03/22/2023   NA 136 05/09/2023   K 3.9 05/09/2023   CL 106 05/09/2023   CREATININE 0.88 05/09/2023   BUN 26 (H) 05/09/2023   CO2 23 05/09/2023   TSH 0.770 03/22/2023   INR 1.2 08/30/2022   HGBA1C 5.4 01/05/2020   MICROALBUR 1.0 01/05/2020    No results found.  Assessment & Plan:  .Other nondisplaced dens fracture, sequela Assessment & Plan: Subacute , stable   GAD (generalized anxiety disorder) Assessment & Plan: She reports improved mood and less worrying since starting zoloft  but has reduced dose to 25 mg due to excessive sedation    SVT (supraventricular tachycardia) (HCC) Assessment & Plan: Managed with amiodarone and metoprolol    Essential hypertension Assessment & Plan: blood pressure control important in reducing the progression of atherosclerotic disease. On appropriate oral medications.readings are labile no changes today     Hypertrophic lichen planus of vulva Assessment & Plan: Diagnosed by Biopsy Dr Dalbert Garnet. Managed with clobetasol  and estrace    Age related osteoporosis, unspecified pathological fracture presence Assessment & Plan: Managed with Prolia injections every 6 months / T scores stable/unchanged by 2021.  continue prolia;  Last injection was given August 2024.      Follow-up: No follow-ups on file.   Sherlene Shams, MD

## 2023-09-14 NOTE — Progress Notes (Deleted)
  Electrophysiology Office Follow up Visit Note:    Date:  09/14/2023   ID:  Brenda Cardenas, DOB Mar 31, 1932, MRN [id number]  PCP:  Sherlene Shams, MD  [facility] Cardiologist:  Yvonne Kendall, MD  [facility] Electrophysiologist:  Lanier Prude, MD    Interval History:     Brenda Cardenas is a 89 y.o. female who presents for a follow up visit.   She last saw Suzann 03/26/2023. She takes amiodarone for a history of SVT. She saw Dr End 07/28/2023 and was doing relatively well.  Today, she reports ***    Discussed the use of AI scribe software for clinical note transcription with the patient, who gave verbal consent to proceed.  History of Present Illness                  Past medical, surgical, social and family history were reviewed.  ROS:   Please see the history of present illness.    All other systems reviewed and are negative.  EKGs/Labs/Other Studies Reviewed:    The following studies were reviewed today:          Physical Exam:    VS:  There were no vitals taken for this visit.    Wt Readings from Last 3 Encounters:  08/24/23 120 lb 3.2 oz (54.5 kg)  07/28/23 119 lb 12.8 oz (54.3 kg)  06/05/23 127 lb 6.4 oz (57.8 kg)     GEN: no distress CARD: RRR, No MRG RESP: No IWOB. CTAB.      ASSESSMENT:    No diagnosis found. PLAN:    In order of problems listed above:  #SVT #High risk drug monitoring - amiodarone Doing well on amiodarone. Needs updated CMP, TSH and FT4.  #Hypertension *** goal today.  Recommend checking blood pressures 1-2 times per week at home and recording the values.  Recommend bringing these recordings to the primary care physician.  Follow up 6 months with APP.    Signed, Steffanie Dunn, MD, [facility], [facility] 09/14/2023 10:05 PM    Electrophysiology [facility]

## 2023-09-15 ENCOUNTER — Ambulatory Visit: Payer: Medicare Other | Attending: Cardiology | Admitting: Cardiology

## 2023-09-15 DIAGNOSIS — I471 Supraventricular tachycardia, unspecified: Secondary | ICD-10-CM

## 2023-09-15 DIAGNOSIS — Z79899 Other long term (current) drug therapy: Secondary | ICD-10-CM

## 2023-09-15 DIAGNOSIS — I10 Essential (primary) hypertension: Secondary | ICD-10-CM

## 2023-09-16 ENCOUNTER — Encounter: Payer: Self-pay | Admitting: Cardiology

## 2023-09-16 DIAGNOSIS — M7710 Lateral epicondylitis, unspecified elbow: Secondary | ICD-10-CM | POA: Insufficient documentation

## 2023-09-27 ENCOUNTER — Encounter: Payer: Self-pay | Admitting: *Deleted

## 2023-09-27 MED ORDER — DENOSUMAB 60 MG/ML ~~LOC~~ SOSY
60.0000 mg | PREFILLED_SYRINGE | SUBCUTANEOUS | Status: AC
Start: 2023-11-03 — End: ?
  Administered 2024-01-31: 60 mg via SUBCUTANEOUS

## 2023-09-27 NOTE — Addendum Note (Signed)
Addended by: Warden Fillers on: 09/27/2023 12:49 PM   Modules accepted: Orders

## 2023-09-28 ENCOUNTER — Other Ambulatory Visit: Payer: Self-pay | Admitting: Internal Medicine

## 2023-09-30 ENCOUNTER — Telehealth: Payer: Self-pay

## 2023-10-01 ENCOUNTER — Telehealth: Payer: Self-pay | Admitting: Internal Medicine

## 2023-10-01 NOTE — Telephone Encounter (Signed)
 noted

## 2023-10-01 NOTE — Telephone Encounter (Signed)
 Will pt need any blood work for this appt. If so I will call pt and have her come in early.

## 2023-10-01 NOTE — Telephone Encounter (Signed)
 Copied from CRM [id number]. Topic: Appointments - Scheduling Inquiry for Clinic >> Oct 01, 2023 12:31 PM Antonio DEL wrote: Reason for CRM: Jancie from [facility] community called because she was told that the patient needs to follow up with her PCP regarding her latest visit with gynecologist who noticed 17 pound weight loss. Patient is independent and handles her own schedule. Contacted the patient at the mobile number we have listed for her to get her scheduled but there was no answer.   Patient has been scheduled for 10/03/22- 4:30pm

## 2023-10-04 ENCOUNTER — Ambulatory Visit (INDEPENDENT_AMBULATORY_CARE_PROVIDER_SITE_OTHER): Payer: No Typology Code available for payment source | Admitting: Internal Medicine

## 2023-10-04 ENCOUNTER — Encounter: Payer: Self-pay | Admitting: Internal Medicine

## 2023-10-04 VITALS — BP 126/66 | Ht 60.0 in | Wt 116.8 lb

## 2023-10-04 DIAGNOSIS — I878 Other specified disorders of veins: Secondary | ICD-10-CM | POA: Diagnosis not present

## 2023-10-04 DIAGNOSIS — R634 Abnormal weight loss: Secondary | ICD-10-CM | POA: Diagnosis not present

## 2023-10-04 NOTE — Assessment & Plan Note (Addendum)
 She has lost 7.9% over the last 6 months.  Likely due to cognitive decline, as she lives alone. CT chest ab and pelvis  was normal in May.  She is supplementing with Ensure and peanut butter

## 2023-10-04 NOTE — Progress Notes (Signed)
 Subjective:  Patient ID: Brenda Cardenas, female    DOB: 1931/11/01  Age: 89 y.o. MRN: [id number]  CC: The primary encounter diagnosis was Weight loss, unintentional. A diagnosis of Venous stasis of both lower extremities was also pertinent to this visit.   HPI Brenda Cardenas presents for  Chief Complaint  Patient presents with   Medical Management of Chronic Issues    Discuss unintentional weight loss   Brenda Cardenas is an 89 yr old female with early vascular dementia) who presents in referral from GYN  Referred by Dr Verdon , GYN  (treating vulvar lichen planus with clobetasol  )  due to unintentional weight loss reportedly 18 lbs.  Review of our records indicate that Brenda Cardenas has lost  10 lbs since June.   Diet reviewed:  appetite is good.   drinks an Ensure and Activity daily  peanut butter tsp daily  on a waffle.  . CT chest abd an dpelvis done in May 2024 reviewed:   she has no signs of CA  and has had follow up with Urology  Dysuria is improving with estrogen and clobetasol  for lichen planus . still having urinary incontinence occasional  LE edema improving with use of compression knee highs  .  O recent skin tears.    Has home health aided 3 hours/week   Patient is noted to repeat herself several times during the visit  Outpatient Medications Prior to Visit  Medication Sig Dispense Refill   acetaminophen  (TYLENOL ) 325 MG tablet Take 650 mg by mouth every 6 (six) hours as needed.     amiodarone  (PACERONE ) 200 MG tablet Take 1 tablet (200 mg total) by mouth daily. 90 tablet 3   Cholecalciferol  (VITAMIN D ) 50 MCG (2000 UT) CAPS Take 1 capsule by mouth every other day.     clobetasol  ointment (TEMOVATE ) 0.05 % Apply 1 Application topically 2 (two) times daily. 60 g 1   CRANBERRY PO Take 2 capsules by mouth daily.      estradiol (ESTRACE) 0.1 MG/GM vaginal cream 1 Applicatorful every other day.     ezetimibe  (ZETIA ) 10 MG tablet Take 1 tablet (10 mg total) by mouth daily. 90 tablet 3    furosemide  (LASIX ) 20 MG tablet Take 0.5 tablets (10 mg total) by mouth 2 (two) times a week. Take 1-2 tablets per week as needed for swelling 30 tablet 0   hyoscyamine  (ANASPAZ ) 0.125 MG TBDP disintergrating tablet Place 1 tablet (0.125 mg total) under the tongue every 6 (six) hours as needed (as needed for esophageal spasm). 30 tablet 0   metoprolol  succinate (TOPROL -XL) 25 MG 24 hr tablet TAKE ONE TABLET BY MOUTH EVERY DAY 90 tablet 3   omeprazole  (PRILOSEC) 20 MG capsule TAKE 1 CAPSULE BY MOUTH EVERY DAY 90 capsule 3   Probiotic Product (ALIGN PO) Take 1 tablet by mouth daily.      triamcinolone  ointment (KENALOG ) 0.1 % Apply topically to affected area nightly x 4 weeks until symptoms resolve, then apply nightly as needed when symptomatic     sertraline  (ZOLOFT ) 50 MG tablet Take 1 tablet (50 mg total) by mouth daily. 90 tablet 2   Facility-Administered Medications Prior to Visit  Medication Dose Route Frequency Provider Last Rate Last Admin   [START ON 11/03/2023] denosumab  (PROLIA ) injection 60 mg  60 mg Subcutaneous Q6 months Marylynn Verneita CROME, MD        Review of Systems;  Patient denies headache, fevers, malaise, unintentional weight loss, skin rash, eye pain, sinus congestion  and sinus pain, sore throat, dysphagia,  hemoptysis , cough, dyspnea, wheezing, chest pain, palpitations, orthopnea, edema, abdominal pain, nausea, melena, diarrhea, constipation, flank pain, dysuria, hematuria, urinary  Frequency, nocturia, numbness, tingling, seizures,  Focal weakness, Loss of consciousness,  Tremor, insomnia, depression, anxiety, and suicidal ideation.      Objective:  BP 126/66   Ht 5' (1.524 m)   Wt 116 lb 12.8 oz (53 kg)   BMI 22.81 kg/m   BP Readings from Last 3 Encounters:  10/04/23 126/66  08/24/23 (!) 130/58  08/13/23 138/88    Wt Readings from Last 3 Encounters:  10/04/23 116 lb 12.8 oz (53 kg)  08/24/23 120 lb 3.2 oz (54.5 kg)  07/28/23 119 lb 12.8 oz (54.3 kg)    Physical  Exam Vitals reviewed.  Constitutional:      General: She is not in acute distress.    Appearance: Normal appearance. She is normal weight. She is not ill-appearing, toxic-appearing or diaphoretic.  HENT:     Head: Normocephalic.  Eyes:     General: No scleral icterus.       Right eye: No discharge.        Left eye: No discharge.     Conjunctiva/sclera: Conjunctivae normal.  Cardiovascular:     Rate and Rhythm: Normal rate and regular rhythm.     Heart sounds: Normal heart sounds.  Pulmonary:     Effort: Pulmonary effort is normal. No respiratory distress.     Breath sounds: Normal breath sounds.  Musculoskeletal:        General: Normal range of motion.  Skin:    General: Skin is warm and dry.  Neurological:     General: No focal deficit present.     Mental Status: She is alert and oriented to person, place, and time. Mental status is at baseline.  Psychiatric:        Mood and Affect: Mood normal.        Behavior: Behavior normal.        Thought Content: Thought content normal.        Judgment: Judgment normal.    Lab Results  Component Value Date   HGBA1C 5.4 01/05/2020   HGBA1C 5.5 06/16/2018   HGBA1C 5.7 03/24/2018    Lab Results  Component Value Date   CREATININE 0.88 05/09/2023   CREATININE 0.90 03/22/2023   CREATININE 0.69 01/29/2023    Lab Results  Component Value Date   WBC 12.1 (H) 05/09/2023   HGB 11.4 (L) 05/09/2023   HCT 35.7 (L) 05/09/2023   PLT 155 05/09/2023   GLUCOSE 151 (H) 05/09/2023   CHOL 150 05/28/2022   TRIG 86.0 05/28/2022   HDL 54.00 05/28/2022   LDLDIRECT 103.0 06/16/2017   LDLCALC 79 05/28/2022   ALT 19 03/22/2023   AST 26 03/22/2023   NA 136 05/09/2023   K 3.9 05/09/2023   CL 106 05/09/2023   CREATININE 0.88 05/09/2023   BUN 26 (H) 05/09/2023   CO2 23 05/09/2023   TSH 0.770 03/22/2023   INR 1.2 08/30/2022   HGBA1C 5.4 01/05/2020   MICROALBUR 1.0 01/05/2020    No results found.  Assessment & Plan:  .Weight loss,  unintentional Assessment & Plan: She has lost 7.9% over the last 6 months.  Likely due to cognitive decline, as she lives alone. CT chest ab and pelvis  was normal in May.  She is supplementing with Ensure and peanut butter    Venous stasis of both lower extremities Assessment &  Plan: Managed with compression socks      Follow-up: No follow-ups on file.   Verneita LITTIE Kettering, MD

## 2023-10-04 NOTE — Patient Instructions (Signed)
 You cannot catch  shingles from somebody else,  only chicken pox,  if you have never had it.  You have not only had chicken pox,  you have had the shingles vaccine  YOU HAVE LOST 10 LBS [address] ,  so continue  to work on eating 3 meals and 2 snacks daily  Make sure you are drinking and Ensure that has 30 grams of protein in it daily  Eat your meat /starch FIRST,  SALAD LAST

## 2023-10-05 NOTE — Assessment & Plan Note (Signed)
 Managed with compression socks

## 2023-10-06 ENCOUNTER — Other Ambulatory Visit: Payer: Medicare PPO

## 2023-10-15 ENCOUNTER — Encounter (INDEPENDENT_AMBULATORY_CARE_PROVIDER_SITE_OTHER): Payer: Self-pay | Admitting: Vascular Surgery

## 2023-10-15 ENCOUNTER — Ambulatory Visit (INDEPENDENT_AMBULATORY_CARE_PROVIDER_SITE_OTHER): Payer: Medicare PPO | Admitting: Vascular Surgery

## 2023-10-15 VITALS — BP 154/73 | HR 66 | Resp 15

## 2023-10-15 DIAGNOSIS — I10 Essential (primary) hypertension: Secondary | ICD-10-CM

## 2023-10-15 DIAGNOSIS — E782 Mixed hyperlipidemia: Secondary | ICD-10-CM | POA: Diagnosis not present

## 2023-10-15 DIAGNOSIS — I83893 Varicose veins of bilateral lower extremities with other complications: Secondary | ICD-10-CM

## 2023-10-15 NOTE — Assessment & Plan Note (Signed)
She is getting excellent control with compression socks, elevation, and activity.  No role for intervention for any venous disease at this point.  She has had multiple previous interventions in the past and does have some residual venous disease, but at this point conservative management is working well.  Follow-up in 6 months.

## 2023-10-15 NOTE — Progress Notes (Signed)
MRN : [id number]  Brenda Cardenas is a 89 y.o. (09-03-1932) female who presents with chief complaint of  Chief Complaint  Patient presents with   Follow-up    6 month follow up  .  History of Present Illness: Patient returns today in follow up of her venous insufficiency.  Her legs look very well today.  Her stasis dermatitis changes remain present, but she has almost no swelling at this point.  She has been diligently wearing her compression socks.  She had leg wraps performed at her facility [facility] by the nurse who has provided excellent care for her last year.  She has a lot of arthritic pain but no new ulceration or infection.  Current Outpatient Medications  Medication Sig Dispense Refill   acetaminophen (TYLENOL) 325 MG tablet Take 650 mg by mouth every 6 (six) hours as needed.     amiodarone (PACERONE) 200 MG tablet Take 1 tablet (200 mg total) by mouth daily. 90 tablet 3   Cholecalciferol (VITAMIN D) 50 MCG (2000 UT) CAPS Take 1 capsule by mouth every other day.     clobetasol ointment (TEMOVATE) 0.05 % Apply 1 Application topically 2 (two) times daily. 60 g 1   CRANBERRY PO Take 2 capsules by mouth daily.      estradiol (ESTRACE) 0.1 MG/GM vaginal cream 1 Applicatorful every other day.     ezetimibe (ZETIA) 10 MG tablet Take 1 tablet (10 mg total) by mouth daily. 90 tablet 3   furosemide (LASIX) 20 MG tablet Take 0.5 tablets (10 mg total) by mouth 2 (two) times a week. Take 1-2 tablets per week as needed for swelling 30 tablet 0   hyoscyamine (ANASPAZ) 0.125 MG TBDP disintergrating tablet Place 1 tablet (0.125 mg total) under the tongue every 6 (six) hours as needed (as needed for esophageal spasm). 30 tablet 0   metoprolol succinate (TOPROL-XL) 25 MG 24 hr tablet TAKE ONE TABLET BY MOUTH EVERY DAY 90 tablet 3   omeprazole (PRILOSEC) 20 MG capsule TAKE 1 CAPSULE BY MOUTH EVERY DAY 90 capsule 3   Probiotic Product (ALIGN PO) Take 1 tablet by mouth daily.      triamcinolone  ointment (KENALOG) 0.1 % Apply topically to affected area nightly x 4 weeks until symptoms resolve, then apply nightly as needed when symptomatic     Current Facility-Administered Medications  Medication Dose Route Frequency Provider Last Rate Last Admin   [START ON 11/03/2023] denosumab (PROLIA) injection 60 mg  60 mg Subcutaneous Q6 months Sherlene Shams, MD        Past Medical History:  Diagnosis Date   Arthritis    Basal cell carcinoma 06/04/2010   left upper back   Chronic kidney disease    Colon polyps    Gastritis    GERD (gastroesophageal reflux disease)    Hiatal hernia    Hyperlipidemia    Hypertension    IBS (irritable bowel syndrome)    Low back pain radiating to left leg 03/28/2020   Sciatica, left side 04/15/2020   Squamous cell carcinoma of skin 06/26/2013   left mid pretibial   Squamous cell carcinoma of skin 08/21/2015   lower sternum   Squamous cell carcinoma of skin 10/14/2017   right mid pretibial   Squamous cell carcinoma of skin 03/17/2018   right prox lateral elbow   Squamous cell carcinoma of skin 09/07/2018   right post thigh   Squamous cell carcinoma of skin 05/11/2019   right mid med  pretibial   Squamous cell carcinoma of skin 06/20/2019   right bicep   Supraventricular tachycardia (HCC)     Past Surgical History:  Procedure Laterality Date   APPENDECTOMY  1959   CARPAL TUNNEL RELEASE     CATARACT EXTRACTION W/ INTRAOCULAR LENS IMPLANT Left    CATARACT EXTRACTION W/PHACO Right 04/20/2018   Procedure: CATARACT EXTRACTION PHACO AND INTRAOCULAR LENS PLACEMENT (IOC)  RIGHT;  Surgeon: Lockie Mola, MD;  Location: [facility];  Service: Ophthalmology;  Laterality: Right;   CHOLECYSTECTOMY  1995   COLONOSCOPY WITH PROPOFOL N/A 07/30/2022   Procedure: COLONOSCOPY WITH PROPOFOL;  Surgeon: Toney Reil, MD;  Location: [facility];  Service: Gastroenterology;  Laterality: N/A;   EYE SURGERY     GANGLION CYST EXCISION     JOINT  REPLACEMENT Right 03/2008   [place]    KNEE ARTHROSCOPY     LITHOTRIPSY     SALIVARY GLAND SURGERY     SKIN CANCER EXCISION     TONSILLECTOMY     TONSILLECTOMY AND ADENOIDECTOMY     VEIN LIGATION       Social History   Tobacco Use   Smoking status: Never   Smokeless tobacco: Never  Vaping Use   Vaping status: Never Used  Substance Use Topics   Alcohol use: No    Alcohol/week: 0.0 standard drinks of alcohol   Drug use: No      Family History  Problem Relation Age of Onset   Heart disease Mother    Arthritis Mother    Diabetes Father      Allergies  Allergen Reactions   Clindamycin/Lincomycin Diarrhea   Sulfa Antibiotics Rash    Rash in throat   Augmentin [Amoxicillin-Pot Clavulanate] Diarrhea   Gabapentin Other (See Comments)    Irregular heartbeat   Lidocaine     Other reaction(s): Not available   Naproxen Sodium Other (See Comments)    Causes gastritis   Prednisone     Atrial Fibrillation   Tramadol Diarrhea    "upset stomach, headache, dizziness, drowsiness"   Codeine Nausea Only and Rash    Upset stomach   Doxycycline Hyclate Nausea And Vomiting and Nausea Only    Dry heaves.    E-Mycin [Erythromycin] Rash   Macrobid [Nitrofurantoin] Rash    severe   Nabumetone Rash and Other (See Comments)    Upsets liver enzymes   Nitrofurantoin Monohyd Macro Rash   Nsaids Rash    Gastritis, GI ulceration.    Phenobarbital Other (See Comments), Rash and Anxiety    "got wild" Patient becomes very hyper and anxious   Statins Rash    Upsets liver enzymes     REVIEW OF SYSTEMS (Negative unless checked)   Constitutional: [] Weight loss  [] Fever  [] Chills Cardiac: [] Chest pain   [] Chest pressure   [x] Palpitations   [] Shortness of breath when laying flat   [] Shortness of breath at rest   [x] Shortness of breath with exertion. Vascular:  [] Pain in legs with walking   [] Pain in legs at rest   [] Pain in legs when laying flat   [] Claudication   [] Pain in feet when  walking  [] Pain in feet at rest  [] Pain in feet when laying flat   [] History of DVT   [] Phlebitis   [x] Swelling in legs   [x] Varicose veins   [] Non-healing ulcers Pulmonary:   [] Uses home oxygen   [] Productive cough   [] Hemoptysis   [] Wheeze  [] COPD   [] Asthma Neurologic:  [] Dizziness  [] Blackouts   []   Seizures   [] History of stroke   [] History of TIA  [] Aphasia   [] Temporary blindness   [] Dysphagia   [] Weakness or numbness in arms   [] Weakness or numbness in legs Musculoskeletal:  [x] Arthritis   [] Joint swelling   [x] Joint pain   [] Low back pain Hematologic:  [] Easy bruising  [] Easy bleeding   [] Hypercoagulable state   [] Anemic  [] Hepatitis Gastrointestinal:  [] Blood in stool   [] Vomiting blood  [] Gastroesophageal reflux/heartburn   [] Abdominal pain Genitourinary:  [] Chronic kidney disease   [] Difficult urination  [] Frequent urination  [] Burning with urination   [] Hematuria Skin:  [] Rashes   [] Ulcers   [] Wounds Psychological:  [] History of anxiety   []  History of major depression.  Physical Examination  BP (!) 154/73   Pulse 66   Resp 15  Gen:  WD/WN, NAD Head: [place]/AT, No temporalis wasting. Ear/Nose/Throat: Hearing grossly intact, nares w/o erythema or drainage Eyes: Conjunctiva clear. Sclera non-icteric Neck: Supple.  Trachea midline Pulmonary:  Good air movement, no use of accessory muscles.  Cardiac: Somewhat irregular Vascular:  Vessel Right Left  Radial Palpable Palpable           Musculoskeletal: M/S 5/5 throughout.  Significant arthritic changes are present.  No right lower extremity edema.  Trace left lower extremity edema Neurologic: Sensation grossly intact in extremities.  Symmetrical.  Speech is fluent.  Psychiatric: Judgment intact, Mood & affect appropriate for pt's clinical situation. Dermatologic: No rashes or ulcers noted.  No cellulitis or open wounds.  Stasis dermatitis changes are present in both lower extremities more on the left than the right      Labs No  results found for this or any previous visit (from the past 2160 hours).  Radiology No results found.  Assessment/Plan  Varicose veins of bilateral lower extremities with other complications She is getting excellent control with compression socks, elevation, and activity.  No role for intervention for any venous disease at this point.  She has had multiple previous interventions in the past and does have some residual venous disease, but at this point conservative management is working well.  Follow-up in 6 months.  Essential hypertension blood pressure control important in reducing the progression of atherosclerotic disease. On appropriate oral medications.     Mixed hyperlipidemia lipid control important in reducing the progression of atherosclerotic disease.   Festus Barren, MD  10/15/2023 10:02 AM    This note was created with Dragon medical transcription system.  Any errors from dictation are purely unintentional

## 2023-10-18 ENCOUNTER — Other Ambulatory Visit (INDEPENDENT_AMBULATORY_CARE_PROVIDER_SITE_OTHER): Payer: Medicare PPO

## 2023-10-18 DIAGNOSIS — D649 Anemia, unspecified: Secondary | ICD-10-CM | POA: Diagnosis not present

## 2023-10-18 DIAGNOSIS — R5383 Other fatigue: Secondary | ICD-10-CM | POA: Diagnosis not present

## 2023-10-18 DIAGNOSIS — E782 Mixed hyperlipidemia: Secondary | ICD-10-CM | POA: Diagnosis not present

## 2023-10-18 DIAGNOSIS — I10 Essential (primary) hypertension: Secondary | ICD-10-CM | POA: Diagnosis not present

## 2023-10-18 DIAGNOSIS — R7301 Impaired fasting glucose: Secondary | ICD-10-CM

## 2023-10-18 LAB — COMPREHENSIVE METABOLIC PANEL
ALT: 17 U/L (ref 0–35)
AST: 24 U/L (ref 0–37)
Albumin: 3.7 g/dL (ref 3.5–5.2)
Alkaline Phosphatase: 88 U/L (ref 39–117)
BUN: 35 mg/dL — ABNORMAL HIGH (ref 6–23)
CO2: 27 meq/L (ref 19–32)
Calcium: 9.5 mg/dL (ref 8.4–10.5)
Chloride: 103 meq/L (ref 96–112)
Creatinine, Ser: 0.9 mg/dL (ref 0.40–1.20)
GFR: 56.54 mL/min — ABNORMAL LOW (ref >60.00)
Glucose, Bld: 100 mg/dL — ABNORMAL HIGH (ref 70–99)
Potassium: 4 meq/L (ref 3.5–5.1)
Sodium: 140 meq/L (ref 135–145)
Total Bilirubin: 0.8 mg/dL (ref 0.2–1.2)
Total Protein: 6.4 g/dL (ref 6.0–8.3)

## 2023-10-18 LAB — CBC WITH DIFFERENTIAL/PLATELET
Basophils Absolute: 0.1 10*3/uL (ref 0.0–0.1)
Basophils Relative: 0.7 % (ref 0.0–3.0)
Eosinophils Absolute: 0.4 10*3/uL (ref 0.0–0.7)
Eosinophils Relative: 2.8 % (ref 0.0–5.0)
HCT: 39 % (ref 36.0–46.0)
Hemoglobin: 12.4 g/dL (ref 12.0–15.0)
Lymphocytes Relative: 16.3 % (ref 12.0–46.0)
Lymphs Abs: 2.2 10*3/uL (ref 0.7–4.0)
MCHC: 31.8 g/dL (ref 30.0–36.0)
MCV: 99.6 fL (ref 78.0–100.0)
Monocytes Absolute: 0.5 10*3/uL (ref 0.1–1.0)
Monocytes Relative: 4 % (ref 3.0–12.0)
Neutro Abs: 10 10*3/uL — ABNORMAL HIGH (ref 1.4–7.7)
Neutrophils Relative %: 76.2 % (ref 43.0–77.0)
Platelets: 179 10*3/uL (ref 150.0–400.0)
RBC: 3.92 Mil/uL (ref 3.87–5.11)
RDW: 23.4 % — ABNORMAL HIGH (ref 11.5–15.5)
WBC: 13.2 10*3/uL — ABNORMAL HIGH (ref 4.0–10.5)

## 2023-10-18 LAB — LIPID PANEL
Cholesterol: 156 mg/dL (ref 0–200)
HDL: 58.5 mg/dL (ref >39.00)
LDL Cholesterol: 80 mg/dL (ref 0–99)
NonHDL: 97.59
Total CHOL/HDL Ratio: 3
Triglycerides: 89 mg/dL (ref 0.0–149.0)
VLDL: 17.8 mg/dL (ref 0.0–40.0)

## 2023-10-18 LAB — HEMOGLOBIN A1C: Hgb A1c MFr Bld: 5.6 % (ref 4.6–6.5)

## 2023-10-18 LAB — LDL CHOLESTEROL, DIRECT: Direct LDL: 82 mg/dL

## 2023-10-18 LAB — MICROALBUMIN / CREATININE URINE RATIO
Creatinine,U: 67.4 mg/dL
Microalb Creat Ratio: 83.7 mg/g — ABNORMAL HIGH (ref 0.0–30.0)
Microalb, Ur: 56.4 mg/dL — ABNORMAL HIGH (ref 0.0–1.9)

## 2023-10-18 LAB — TSH: TSH: 1.35 u[IU]/mL (ref 0.35–5.50)

## 2023-10-19 ENCOUNTER — Encounter: Payer: Self-pay | Admitting: Internal Medicine

## 2023-10-19 DIAGNOSIS — M1611 Unilateral primary osteoarthritis, right hip: Secondary | ICD-10-CM | POA: Insufficient documentation

## 2023-10-19 DIAGNOSIS — M79651 Pain in right thigh: Secondary | ICD-10-CM | POA: Insufficient documentation

## 2023-10-19 MED ORDER — LOSARTAN POTASSIUM 50 MG PO TABS: 50.0000 mg | ORAL_TABLET | Freq: Every day | ORAL | 2 refills | Status: DC

## 2023-10-19 NOTE — Assessment & Plan Note (Signed)
Adding losartan 50 mg daily for new onset proteinuria  Lab Results  Component Value Date   MICROALBUR 56.4 (H) 10/18/2023   MICROALBUR 1.0 01/05/2020

## 2023-10-19 NOTE — Addendum Note (Signed)
Addended by: Sherlene Shams on: 10/19/2023 07:31 PM   Modules accepted: Orders

## 2023-10-21 ENCOUNTER — Other Ambulatory Visit (INDEPENDENT_AMBULATORY_CARE_PROVIDER_SITE_OTHER): Payer: Medicare PPO

## 2023-10-21 ENCOUNTER — Other Ambulatory Visit: Payer: Medicare PPO

## 2023-10-21 DIAGNOSIS — I10 Essential (primary) hypertension: Secondary | ICD-10-CM

## 2023-10-21 LAB — BASIC METABOLIC PANEL
BUN: 34 mg/dL — ABNORMAL HIGH (ref 6–23)
CO2: 28 meq/L (ref 19–32)
Calcium: 9 mg/dL (ref 8.4–10.5)
Chloride: 101 meq/L (ref 96–112)
Creatinine, Ser: 0.9 mg/dL (ref 0.40–1.20)
GFR: 56.54 mL/min — ABNORMAL LOW (ref >60.00)
Glucose, Bld: 101 mg/dL — ABNORMAL HIGH (ref 70–99)
Potassium: 3.8 meq/L (ref 3.5–5.1)
Sodium: 137 meq/L (ref 135–145)

## 2023-10-22 ENCOUNTER — Other Ambulatory Visit: Payer: Self-pay | Admitting: Internal Medicine

## 2023-10-22 DIAGNOSIS — S32599A Other specified fracture of unspecified pubis, initial encounter for closed fracture: Secondary | ICD-10-CM | POA: Insufficient documentation

## 2023-10-23 ENCOUNTER — Encounter: Payer: Self-pay | Admitting: Internal Medicine

## 2023-11-01 ENCOUNTER — Other Ambulatory Visit (HOSPITAL_COMMUNITY): Payer: Self-pay

## 2023-11-01 NOTE — Telephone Encounter (Signed)
No deductible, $40 coinsurance, $0 admin fee, PA required

## 2023-11-01 NOTE — Telephone Encounter (Signed)
Pharmacy Patient Advocate Encounter   Received notification from  [facility] Portal that prior authorization for PROLIA is required/requested.   Insurance verification completed.   The patient is insured through [place] .   Per test claim: PA required; PA submitted to above mentioned insurance via CoverMyMeds Key/confirmation #/EOC [id number] Status is pending

## 2023-11-03 ENCOUNTER — Other Ambulatory Visit (HOSPITAL_COMMUNITY): Payer: Self-pay

## 2023-11-03 NOTE — Telephone Encounter (Signed)
 Pt ready for scheduling for PROLIA  on or after : 11/03/23  Out-of-pocket cost due at time of visit: $40  Number of injection/visits approved: 2  Primary: [employer] Prolia  co-insurance: $40 Admin fee co-insurance: 0%  Secondary: --- Prolia  co-insurance:  Admin fee co-insurance:   Medical Benefit Details: Date Benefits were checked: 11/01/23 Deductible: NO/ Coinsurance: $40/ Admin Fee: 0%  Prior Auth: APPROVED PA# [id number] Expiration Date: 11/02/23-09/27/24  # of doses approved: 2   Pharmacy benefit: Copay $64 If patient wants fill through the pharmacy benefit please send prescription to: [employer], and include estimated need by date in rx notes. Pharmacy will ship medication directly to the office.  Patient NOT eligible for Prolia  Copay Card. Copay Card can make patient's cost as little as $25. Link to apply: [email or web address]://[email or web address].[email or web address]/copay  ** This summary of benefits is an estimation of the patient's out-of-pocket cost. Exact cost may very based on individual plan coverage.

## 2023-11-03 NOTE — Telephone Encounter (Signed)
 Pharmacy Patient Advocate Encounter  Received notification from [employer] that Prior Authorization for PROLIA  has been APPROVED from 11/02/23 to 09/27/24. Ran test claim, Copay is $64. This test claim was processed through [facility]- copay amounts may vary at other pharmacies due to pharmacy/plan contracts, or as the patient moves through the different stages of their insurance plan.   PA #/Case ID/Reference #: [id number]

## 2023-11-05 ENCOUNTER — Encounter: Payer: Self-pay | Admitting: Internal Medicine

## 2023-11-08 ENCOUNTER — Other Ambulatory Visit: Payer: Self-pay

## 2023-11-08 ENCOUNTER — Other Ambulatory Visit (INDEPENDENT_AMBULATORY_CARE_PROVIDER_SITE_OTHER): Payer: Medicare PPO

## 2023-11-08 DIAGNOSIS — R300 Dysuria: Secondary | ICD-10-CM

## 2023-11-08 LAB — URINALYSIS, ROUTINE W REFLEX MICROSCOPIC
Bilirubin Urine: NEGATIVE
Ketones, ur: NEGATIVE
Nitrite: NEGATIVE
Specific Gravity, Urine: 1.025 (ref 1.000–1.030)
Total Protein, Urine: 100 — AB
Urine Glucose: NEGATIVE
Urobilinogen, UA: 0.2 (ref 0.0–1.0)
pH: 6 (ref 5.0–8.0)

## 2023-11-08 NOTE — Telephone Encounter (Signed)
 Duplicate message.

## 2023-11-08 NOTE — Progress Notes (Signed)
 urin

## 2023-11-09 ENCOUNTER — Ambulatory Visit: Payer: Self-pay | Admitting: Internal Medicine

## 2023-11-09 ENCOUNTER — Encounter: Payer: Self-pay | Admitting: Internal Medicine

## 2023-11-09 ENCOUNTER — Telehealth: Payer: Self-pay

## 2023-11-09 ENCOUNTER — Telehealth: Payer: Self-pay | Admitting: *Deleted

## 2023-11-09 LAB — URINE CULTURE
MICRO NUMBER:: 16063506
Result:: NO GROWTH
SPECIMEN QUALITY:: ADEQUATE

## 2023-11-09 MED ORDER — CIPROFLOXACIN HCL 250 MG PO TABS: 250.0000 mg | ORAL_TABLET | Freq: Two times a day (BID) | ORAL | 0 refills | Status: AC

## 2023-11-09 NOTE — Telephone Encounter (Unsigned)
Copied from CRM [id number]. Topic: Clinical - Prescription Issue >> Nov 09, 2023  9:02 AM Drema Balzarine wrote: Reason for CRM: [facility] called says that patient is allergic to the Ciprofloxacin   Allergies: Cipro Sulfa Suraeantin

## 2023-11-09 NOTE — Telephone Encounter (Signed)
Spoke with pt and she stated that she can not take Cipro due to it giving her c. Diff back in 2019 and caused her to be hospitalized. Pt is very adamant that she can not take this antibiotic. Pt also stated that she is having severe back pain and would like something called in for pain. She stated that she saw [facility] and they told her that she has a fracture in one of her vertebras.  Pt is taking 500 mg of tylenol 4 times daily and it is not helping with the pain.

## 2023-11-09 NOTE — Telephone Encounter (Signed)
Please see previous encounter

## 2023-11-09 NOTE — Telephone Encounter (Signed)
Copied from CRM [id number]. Topic: Clinical - Prescription Issue >> Nov 09, 2023  1:40 PM Eunice Blase wrote: Reason for CRM: Received call from pt stated [facility] called says that patient is allergic to the Ciprofloxacin for UTI. Please call pt [phone]

## 2023-11-09 NOTE — Telephone Encounter (Signed)
Correction:  she has tramadol and codeine and lidocaine ALL LISTED AS ALLERGIES.  SO THERE IS NOTHING I CAN SEND IN , SO THERE IS NOTHING I CAN PRESCRIBE FOR HER BACK PAIN

## 2023-11-09 NOTE — Telephone Encounter (Signed)
See previous message

## 2023-11-09 NOTE — Telephone Encounter (Signed)
Copied from CRM [id number]. Topic: Clinical - Lab/Test Results >> Nov 09, 2023  2:51 PM Denese Killings wrote: Reason for CRM: Brenda Cardenas with [facility] stated that patient had a recent urine test recently. They are requesting that a copy of her results to be faxed to [phone]

## 2023-11-09 NOTE — Addendum Note (Signed)
Addended by: Sherlene Shams on: 11/09/2023 08:35 AM   Modules accepted: Orders

## 2023-11-09 NOTE — Telephone Encounter (Signed)
Spoke with pt and she gave a verbal understanding.

## 2023-11-09 NOTE — Telephone Encounter (Signed)
LMTCB

## 2023-11-09 NOTE — Telephone Encounter (Signed)
Chief Complaint: back pain related to UTI Symptoms: back pain Frequency: n/a Pertinent Negatives: Patient denies n/a Disposition: [] ED /[] Urgent Care (no appt availability in office) / [x] Appointment(In office/virtual)/ []  [facility]/ [] Home Care/ [] Refused Recommended Disposition /[] [place] [facility]/ []  Follow-up with PCP Additional Notes: Patient called in to request to speak directly to Dr. Darrick Huntsman to advise her to get in touch with Annice Pih at [facility] [phone], stating Annice Pih contacted patient and stated that the medication Dr. Darrick Huntsman called in for the UTI (Cipro) is a severe allergy for the patient. Patient states she was hospitalized over this allergy years ago. Patient is stating she is certain her back pain is related to the UTI she is currently experiencing, as she has dealt with this since 2019.  This RN called [facility] to inform nurse of call requested to [facility].   Copied from CRM [id number]. Topic: Clinical - Red Word Triage >> Nov 09, 2023 12:54 PM Corin V wrote: [facility] that prompted transfer to Nurse Triage: Patient has a UTI and is having severe back pain. She had a fall a few weeks ago and had X-rays, she had another fall a few weeks later and has a fracture from that. The pain is on the left side and the frequent urination is increasing pain.   Reason for Disposition  Back pain    From fall, patient states back pain is related to UTI that is getting treated currently  Answer Assessment - Initial Assessment Questions 1. ONSET: "When did the pain begin?"      Larey Seat a couple weeks ago, but unsure if it's due to the fall or UTI 2. LOCATION: "Where does it hurt?" (upper, mid or lower back)     Whole back hurts 3. SEVERITY: "How bad is the pain?"  (e.g., Scale 1-10; mild, moderate, or severe)   - MILD (1-3): Doesn't interfere with normal activities.    - MODERATE (4-7): Interferes with normal activities or awakens from sleep.    - SEVERE  (8-10): Excruciating pain, unable to do any normal activities.      Severe pain 4. PATTERN: "Is the pain constant?" (e.g., yes, no; constant, intermittent)      Yes 5. RADIATION: "Does the pain shoot into your legs or somewhere else?"     All over back  6. CAUSE:  "What do you think is causing the back pain?"      UTI  7. BACK OVERUSE:  "Any recent lifting of heavy objects, strenuous work or exercise?"     N/a 8. MEDICINES: "What have you taken so far for the pain?" (e.g., nothing, acetaminophen, NSAIDS)     Patient was put on extra strength tylenol  9. NEUROLOGIC SYMPTOMS: "Do you have any weakness, numbness, or problems with bowel/bladder control?"      Loss of bladder control 10. OTHER SYMPTOMS: "Do you have any other symptoms?" (e.g., fever, abdomen pain, burning with urination, blood in urine)       Urinary urgency  Protocols used: Back Pain-A-AH

## 2023-11-10 ENCOUNTER — Telehealth: Payer: Self-pay

## 2023-11-10 NOTE — Telephone Encounter (Signed)
Copied from CRM [id number]. Topic: Clinical - Prescription Issue >> Nov 10, 2023  8:33 AM Kathryne Eriksson wrote: Reason for CRM: ciprofloxacin (CIPRO) 250 MG tablet >> Nov 10, 2023  8:36 AM Kathryne Eriksson wrote: Pharmacy is calling in on behalf of the patient, stating they received a prescription for ciprofloxacin (CIPRO) 250 MG tablet but the patient is allergic. [facility] , [phone] is urgently requesting a call back.

## 2023-11-10 NOTE — Telephone Encounter (Signed)
faxed

## 2023-11-10 NOTE — Telephone Encounter (Signed)
Spoke with pharmacy to let them know to go ahead and cancel out that prescription and let them know that we have added the medication to her med list.

## 2023-11-11 ENCOUNTER — Telehealth: Payer: Self-pay | Admitting: *Deleted

## 2023-11-16 NOTE — Progress Notes (Unsigned)
  Electrophysiology Office Follow up Visit Note:    Date:  11/17/2023   ID:  Brenda Cardenas, DOB Mar 07, 1932, MRN [id number]  PCP:  Sherlene Shams, MD  [facility] Cardiologist:  Yvonne Kendall, MD  [facility] Electrophysiologist:  Lanier Prude, MD    Interval History:     Brenda Cardenas is a 89 y.o. female who presents for a follow up visit.   I last saw the patient December 02, 2022 for SVT.  She is on amiodarone for rhythm management. She most recently saw cardiology on July 28, 2023 with an appointment with Dr. Okey Dupre.  At that appointment she reported good control of her arrhythmias.  She is doing well. No sustained arrhythmias. Is working with PT. Has a home health RN come twice daily and longer on Thursdays.       Past medical, surgical, social and family history were reviewed.  ROS:   Please see the history of present illness.    All other systems reviewed and are negative.  EKGs/Labs/Other Studies Reviewed:    The following studies were reviewed today:  May 13, 2022 echo EF 55-60 RV normal Mild MR         Physical Exam:    VS:  BP (!) 116/58   Pulse 62   Ht 5\' 1"  (1.549 m)   Wt 116 lb (52.6 kg)   BMI 21.92 kg/m     Wt Readings from Last 3 Encounters:  11/17/23 116 lb (52.6 kg)  10/04/23 116 lb 12.8 oz (53 kg)  08/24/23 120 lb 3.2 oz (54.5 kg)     GEN: no distress CARD: RRR, No MRG RESP: No IWOB. CTAB.      ASSESSMENT:    1. SVT (supraventricular tachycardia) (HCC)   2. Encounter for long-term (current) use of high-risk medication   3. Primary hypertension    PLAN:    In order of problems listed above:  #SVT #High risk drug monitoring-amiodarone Well-controlled on amiodarone. Liver and thyroid labs acceptable in January 2025 Will need repeat CMP, TSH and free T4 in 6 months.  #Hypertension At goal today.  Recommend checking blood pressures 1-2 times per week at home and recording the values.  Recommend bringing these  recordings to the primary care physician.  Follow-up 6 months with EP APP   Signed, Steffanie Dunn, MD, [facility], [facility] 11/17/2023 9:03 AM    Electrophysiology [facility]

## 2023-11-17 ENCOUNTER — Encounter: Payer: Self-pay | Admitting: Cardiology

## 2023-11-17 ENCOUNTER — Ambulatory Visit: Payer: Medicare Other | Admitting: Cardiology

## 2023-11-17 ENCOUNTER — Ambulatory Visit: Payer: Medicare PPO | Attending: Cardiology | Admitting: Cardiology

## 2023-11-17 VITALS — BP 116/58 | HR 62 | Ht 61.0 in | Wt 116.0 lb

## 2023-11-17 DIAGNOSIS — I10 Essential (primary) hypertension: Secondary | ICD-10-CM

## 2023-11-17 DIAGNOSIS — Z79899 Other long term (current) drug therapy: Secondary | ICD-10-CM

## 2023-11-17 DIAGNOSIS — I471 Supraventricular tachycardia, unspecified: Secondary | ICD-10-CM

## 2023-11-17 NOTE — Patient Instructions (Signed)
 Medication Instructions:  Your physician recommends that you continue on your current medications as directed. Please refer to the Current Medication list given to you today.  *If you need a refill on your cardiac medications before your next appointment, please call your pharmacy*  Follow-Up: At [facility], you and your health needs are our priority.  As part of our continuing mission to provide you with exceptional heart care, we have created designated Provider Care Teams.  These Care Teams include your primary Cardiologist (physician) and Advanced Practice Providers (APPs -  Physician Assistants and Nurse Practitioners) who all work together to provide you with the care you need, when you need it.   Your next appointment:   6 months  Provider:   Sherie Don, NP

## 2023-11-25 ENCOUNTER — Ambulatory Visit: Payer: Medicare Other | Admitting: Internal Medicine

## 2023-12-03 ENCOUNTER — Encounter: Payer: Self-pay | Admitting: Cardiology

## 2023-12-05 ENCOUNTER — Encounter: Payer: Self-pay | Admitting: *Deleted

## 2023-12-06 NOTE — Telephone Encounter (Signed)
 Duplicate message.

## 2023-12-13 ENCOUNTER — Ambulatory Visit: Payer: Self-pay | Admitting: Internal Medicine

## 2023-12-13 ENCOUNTER — Encounter: Payer: Self-pay | Admitting: Internal Medicine

## 2023-12-13 NOTE — Telephone Encounter (Signed)
 Copied from CRM [id number]. Topic: Clinical - Red Word Triage >> Dec 13, 2023  1:04 PM Drema Balzarine wrote: Red Word that prompted transfer to Nurse Triage: Patient sent MyChart to clinic this morning "Last week I took all the Christmas ornaments off my tree and now Im having sternal pain. It's painful to take a deep breath and to move certain ways. I had the nurse at [facility] check my vitals and assess me. No signs of heart issue more muscular or related to my ribs. Please advise"   When I spoke with her she said she's having chest pains when she lays down Wants PCP to be aware; VSS by ALF nurse.  Would like call back from office.  Declined to be triaged.  Reason for Disposition  Caller requesting an appointment, triage offered and declined  Answer Assessment - Initial Assessment Questions 1. REASON FOR CALL or QUESTION: "What is your reason for calling today?" or "How can I best help you?" or "What question do you have that I can help answer?"     Having sternal pain and was checked out by nurse at ALF.  Is having a procedure and would like to know what the doctor wants her to do.  Protocols used: PCP Call - No Triage-A-AH

## 2023-12-13 NOTE — Telephone Encounter (Signed)
 See my chart message

## 2023-12-14 NOTE — Telephone Encounter (Signed)
 Left a detailed message.

## 2023-12-14 NOTE — Telephone Encounter (Signed)
 I have made three attempts to reach patient to schedule her Prolia. Her last injection was 05/03/2023.   I tried to reach her by phone today as well with not success.  Prolia chart will be archived the first of April.

## 2023-12-14 NOTE — Telephone Encounter (Signed)
 Copied from CRM [id number]. Topic: General - Other >> Dec 14, 2023  4:01 PM Jon Gills C wrote: Reason for CRM: Patient called in stated that she had a missed call from someone from this number, would like for them to give her a callback

## 2023-12-16 DIAGNOSIS — K449 Diaphragmatic hernia without obstruction or gangrene: Secondary | ICD-10-CM | POA: Diagnosis not present

## 2023-12-16 DIAGNOSIS — R310 Gross hematuria: Secondary | ICD-10-CM | POA: Diagnosis not present

## 2023-12-16 DIAGNOSIS — N3000 Acute cystitis without hematuria: Secondary | ICD-10-CM | POA: Diagnosis not present

## 2023-12-16 DIAGNOSIS — N10 Acute tubulo-interstitial nephritis: Secondary | ICD-10-CM | POA: Diagnosis not present

## 2023-12-22 NOTE — Telephone Encounter (Signed)
 Left voicemail to return call to schedule Prolia. Final attempt to reach patient.

## 2023-12-23 ENCOUNTER — Other Ambulatory Visit: Payer: Self-pay | Admitting: Internal Medicine

## 2023-12-23 DIAGNOSIS — I10 Essential (primary) hypertension: Secondary | ICD-10-CM

## 2023-12-24 ENCOUNTER — Encounter: Payer: Self-pay | Admitting: Internal Medicine

## 2023-12-30 DIAGNOSIS — R300 Dysuria: Secondary | ICD-10-CM | POA: Diagnosis not present

## 2023-12-30 DIAGNOSIS — R310 Gross hematuria: Secondary | ICD-10-CM | POA: Diagnosis not present

## 2023-12-30 DIAGNOSIS — R339 Retention of urine, unspecified: Secondary | ICD-10-CM | POA: Diagnosis not present

## 2024-01-10 NOTE — Telephone Encounter (Signed)
 PT received last Prolia on 05/03/23. No further attempts will be made to contact patient for overdue Prolia. See previous attempts below.

## 2024-01-20 DIAGNOSIS — R319 Hematuria, unspecified: Secondary | ICD-10-CM | POA: Diagnosis not present

## 2024-01-20 DIAGNOSIS — R310 Gross hematuria: Secondary | ICD-10-CM | POA: Diagnosis not present

## 2024-01-25 DIAGNOSIS — R300 Dysuria: Secondary | ICD-10-CM | POA: Diagnosis not present

## 2024-01-25 DIAGNOSIS — N390 Urinary tract infection, site not specified: Secondary | ICD-10-CM | POA: Diagnosis not present

## 2024-01-25 DIAGNOSIS — R310 Gross hematuria: Secondary | ICD-10-CM | POA: Diagnosis not present

## 2024-01-27 ENCOUNTER — Other Ambulatory Visit: Payer: Self-pay | Admitting: Internal Medicine

## 2024-01-31 ENCOUNTER — Telehealth: Payer: Self-pay

## 2024-01-31 ENCOUNTER — Ambulatory Visit (INDEPENDENT_AMBULATORY_CARE_PROVIDER_SITE_OTHER)

## 2024-01-31 ENCOUNTER — Other Ambulatory Visit: Payer: Self-pay | Admitting: *Deleted

## 2024-01-31 DIAGNOSIS — M810 Age-related osteoporosis without current pathological fracture: Secondary | ICD-10-CM

## 2024-01-31 DIAGNOSIS — E559 Vitamin D deficiency, unspecified: Secondary | ICD-10-CM

## 2024-01-31 LAB — VITAMIN D 25 HYDROXY (VIT D DEFICIENCY, FRACTURES): VITD: 34.39 ng/mL (ref 30.00–100.00)

## 2024-01-31 MED ORDER — DENOSUMAB 60 MG/ML ~~LOC~~ SOSY
60.0000 mg | PREFILLED_SYRINGE | SUBCUTANEOUS | Status: AC
Start: 2024-08-02 — End: ?
  Administered 2024-08-02: 60 mg via SUBCUTANEOUS

## 2024-01-31 NOTE — Telephone Encounter (Signed)
 Spoke with pt to verify that she is coming in for her appt today and didn't need to move it to tomorrow. Pt stated that she will be here this evening.

## 2024-01-31 NOTE — Progress Notes (Signed)
 Pt presented for their subcutaneous Prolia injection. Pt was identified through two identifiers. Pt was given the information packets about the Prolia and told to schedule their next injection 6 months out. Pt tolerated the subq injection well in the left arm.

## 2024-01-31 NOTE — Telephone Encounter (Signed)
 Copied from CRM [id number]. Topic: Appointments - Appointment Scheduling >> Jan 31, 2024  9:40 AM Allyne Areola wrote: Patient/patient representative is calling to schedule an appointment. Refer to attachments for appointment information. Patient is calling to schedule her prolia  injection and she would like to know if she is able to come in tomorrow as it will be the only day she has transportation.

## 2024-02-01 ENCOUNTER — Encounter: Payer: Self-pay | Admitting: Internal Medicine

## 2024-02-08 ENCOUNTER — Other Ambulatory Visit: Payer: Self-pay | Admitting: Internal Medicine

## 2024-02-15 ENCOUNTER — Encounter (INDEPENDENT_AMBULATORY_CARE_PROVIDER_SITE_OTHER): Payer: Self-pay

## 2024-02-16 ENCOUNTER — Encounter: Payer: Self-pay | Admitting: Internal Medicine

## 2024-02-17 ENCOUNTER — Ambulatory Visit: Payer: Self-pay

## 2024-02-17 NOTE — Telephone Encounter (Signed)
 Chief Complaint: urinary symptoms Symptoms: dysuria Frequency: constant Pertinent Negatives: Patient denies fever Disposition: [] ED /[] Urgent Care (no appt availability in office) / [x] Appointment(In office/virtual)/ []  [place] [facility]/ [] Home Care/ [] Refused Recommended Disposition /[] [place] [facility]/ []  Follow-up with PCP Additional Notes:  Wants to see Dr. Madelon Scheuermann tomorrow. She is incontinent of urine at baseline. Requesting appointment for urethral pain that is stabbing. Dysuria started around 4pm today and is stabbing, "feels like a knife stabbing". Denies all other symptoms. She is followed by Urology [facility]. Has nurse at senior living she will call for urinary test. Acute evaluation advised, patient insists on only pcp for scheduling, no appointments are available. Please follow up with Gareld June for scheduling.    Copied from CRM [id number]. Topic: Clinical - Red Word Triage >> Feb 17, 2024  5:09 PM Clyde Darling P wrote: Red Word that prompted transfer to Nurse Triage: burning/pain urinate Reason for Disposition  Age > 50 years  Protocols used: Urination Pain - [facility]

## 2024-02-18 ENCOUNTER — Ambulatory Visit: Admitting: Family Medicine

## 2024-02-18 ENCOUNTER — Ambulatory Visit: Payer: Self-pay | Admitting: Family Medicine

## 2024-02-18 ENCOUNTER — Encounter: Payer: Self-pay | Admitting: Family Medicine

## 2024-02-18 VITALS — BP 126/63 | HR 63 | Temp 97.8°F | Resp 20 | Ht 61.0 in | Wt 118.4 lb

## 2024-02-18 DIAGNOSIS — R300 Dysuria: Secondary | ICD-10-CM

## 2024-02-18 LAB — URINALYSIS, ROUTINE W REFLEX MICROSCOPIC
Bilirubin Urine: NEGATIVE
Ketones, ur: NEGATIVE
Nitrite: NEGATIVE
Specific Gravity, Urine: 1.025 (ref 1.000–1.030)
Total Protein, Urine: 100 — AB
Urine Glucose: NEGATIVE
Urobilinogen, UA: 0.2 (ref 0.0–1.0)
pH: 6 (ref 5.0–8.0)

## 2024-02-18 LAB — POC URINALSYSI DIPSTICK (AUTOMATED)
Bilirubin, UA: NEGATIVE
Glucose, UA: NEGATIVE
Ketones, UA: NEGATIVE
Nitrite, UA: NEGATIVE
Protein, UA: POSITIVE — AB
Spec Grav, UA: >=1.03 — AB (ref 1.010–1.025)
Urobilinogen, UA: 0.2 U/dL
pH, UA: 6 (ref 5.0–8.0)

## 2024-02-18 NOTE — Telephone Encounter (Signed)
 Pt already aware. Testing will be done during office visit

## 2024-02-18 NOTE — Telephone Encounter (Signed)
 Copied from CRM [id number]. Topic: Clinical - Request for Lab/Test Order >> Feb 18, 2024  8:27 AM Clydene Darner H wrote: Reason for CRM: Patient called stating she has been dealing with urinary incontinence for the past 3 years and has been under treatment by her PCP and urologist. She spoke with the [facility] nurse yesterday but did not agree with the recommendations provided. Patient is requesting to be seen at today's appointment to have labs ordered to check for a possible UTI and to be prescribed medication if necessary. She expressed concerns regarding her ongoing symptoms and would like further evaluation and support.Patient is requesting a callback from Dr.Tullo nurse at their earliest convenience.

## 2024-02-18 NOTE — Progress Notes (Unsigned)
 SUBJECTIVE:   Chief Complaint  Patient presents with   Dysuria   HPI Presents for acute visit  Discussed the use of AI scribe software for clinical note transcription with the patient, who gave verbal consent to proceed.  History of Present Illness Brenda Cardenas is a 89 year old female with urinary incontinence who presents with severe pelvic pain and frequent urination.  She has experienced urinary incontinence for three years, with frequent urination and a sensation of 'a knife sticking' in her vaginal area, which has worsened recently. She has undergone three cystoscopies, with another scheduled in five months.  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 She uses clobetasol  every other day. She also uses estradiol cream daily, applying it with her finger due to bleeding when using the applicator, both in the vagina and on the urethra. There is some improvement, but significant urinary symptoms persist.  She was previously prescribed Gemtesa but discontinued it due to severe diarrhea. She is currently on Keflex , taking one pill in the morning and one at night as a prophylactic measure for her urinary issues, and has been on this regimen for about a year or two.  She has a history of a kidney stone, which required surgery and lithotripsy. No current back or abdominal pain, fever, or vaginal discharge. Occasional blood in her urine in the past but not currently.  She experiences difficulty with bowel movements, not having them daily, and reports a recent episode of difficulty burping, which she managed with peppermint oil.     PERTINENT PMH / PSH: As above  OBJECTIVE:  BP 126/63   Pulse 63   Temp 97.8 F (36.6 C)   Resp 20   Ht 5\' 1"  (1.549 m)   Wt 118 lb 6 oz (53.7 kg)   SpO2 96%   BMI 22.37 kg/m    Physical Exam Vitals reviewed.  Constitutional:      General: She is  not in acute distress.    Appearance: Normal appearance. She is normal weight. She is not ill-appearing, toxic-appearing or diaphoretic.  Eyes:     General:        Right eye: No discharge.        Left eye: No discharge.     Conjunctiva/sclera: Conjunctivae normal.  Cardiovascular:     Rate and Rhythm: Normal rate and regular rhythm.     Heart sounds: Normal heart sounds.  Pulmonary:     Effort: Pulmonary effort is normal.     Breath sounds: Normal breath sounds.  Abdominal:     General: Bowel sounds are normal.  Musculoskeletal:        General: Normal range of motion.  Skin:    General: Skin is warm and dry.  Neurological:     General: No focal deficit present.     Mental Status: She is alert and oriented to person, place, and time. Mental status is at baseline.  Psychiatric:        Mood and Affect: Mood normal.        Behavior: Behavior normal.        Thought Content: Thought content normal.        Judgment: Judgment normal.           10/04/2023    4:46 PM 01/21/2023    8:48 AM 10/12/2022    1:09 PM 03/19/2022   10:59 AM 12/01/2021  3:00 PM  Depression screen PHQ 2/9  Decreased Interest 0 0 0 0 0  Down, Depressed, Hopeless 0 0 0 0 0  PHQ - 2 Score 0 0 0 0 0       No data to display          ASSESSMENT/PLAN:  Dysuria Assessment & Plan: On prophylactic Keflex  for UTI prevention. Chronic urinary incontinence with frequent urination and discomfort. No infection or hematuria. Gemtesa discontinued due to diarrhea. - POC urine negative for infection - Send urine sample for culture. - Follow up with urologist and gynecologist.   Addendum Urine culture negative for infection   Orders: -     POCT Urinalysis Dipstick (Automated) -     Urine Culture -     Urinalysis, Routine w reflex microscopic    PDMP reviewed  Return if symptoms worsen or fail to improve, for PCP.  Valli Gaw, MD

## 2024-02-18 NOTE — Telephone Encounter (Signed)
 Noted

## 2024-02-18 NOTE — Patient Instructions (Addendum)
 It was a pleasure meeting you today. Thank you for allowing me to take part in your health care.  Our goals for today as we discussed include:  Urine did not show infection Will send for further evaluation  Continue Probiotics daily  Continue clobetasol  cream and follow up with Gynecology  If no improvement recommend follow up with Urology   This is a list of the screening recommended for you and due dates:  Health Maintenance  Topic Date Due   Medicare Annual Wellness Visit  01/15/2022   COVID-19 Vaccine (7 - 2024-25 season) 05/30/2023   Flu Shot  04/28/2024   DTaP/Tdap/Td vaccine (5 - Td or Tdap) 10/18/2027   Pneumonia Vaccine  Completed   DEXA scan (bone density measurement)  Completed   Zoster (Shingles) Vaccine  Completed   HPV Vaccine  Aged Out   Meningitis B Vaccine  Aged Out      If you have any questions or concerns, please do not hesitate to call the office at [phone].  I look forward to our next visit and until then take care and stay safe.  Regards,   Valli Gaw, MD   [facility]

## 2024-02-19 LAB — URINE CULTURE
MICRO NUMBER:: 16495065
SPECIMEN QUALITY:: ADEQUATE

## 2024-02-20 ENCOUNTER — Encounter: Payer: Self-pay | Admitting: Internal Medicine

## 2024-02-22 ENCOUNTER — Telehealth: Payer: Self-pay

## 2024-02-22 NOTE — Telephone Encounter (Signed)
 Printed and faxed

## 2024-02-22 NOTE — Telephone Encounter (Signed)
 Copied from CRM [id number]. Topic: Clinical - Lab/Test Results >> Feb 22, 2024 12:04 PM Brenda Cardenas wrote: Reason for CRM: Patient called wanting to see if Dr. Berl Breed nurse or Dr. Freeda Jerry nurse can send urinalysis results to her urologist Dr. Teresa Fender at [facility], says they are unable to see results and they need them. She doesn't know how to send report to them. She does not have fax number.

## 2024-02-22 NOTE — Telephone Encounter (Signed)
 Copied from CRM [id number]. Topic: Clinical - Lab/Test Results >> Feb 22, 2024 10:42 AM Adonis Hoot wrote: Reason for CRM: Patient returned call regarding urine lab results.I relayed message to patient. She verbalized understanding

## 2024-02-23 ENCOUNTER — Encounter: Payer: Self-pay | Admitting: Family Medicine

## 2024-02-23 NOTE — Assessment & Plan Note (Addendum)
 On prophylactic Keflex  for UTI prevention. Chronic urinary incontinence with frequent urination and discomfort. No infection or hematuria. Gemtesa discontinued due to diarrhea. - POC urine negative for infection - Send urine sample for culture. - Follow up with urologist and gynecologist.   Addendum Urine culture negative for infection

## 2024-02-24 ENCOUNTER — Telehealth: Payer: Self-pay

## 2024-02-24 NOTE — Telephone Encounter (Signed)
 Copied from CRM [id number]. Topic: Clinical - Medical Advice >> Feb 24, 2024  9:01 AM Brenda Cardenas F wrote: Reason for CRM: Patient was seen 02/18/24 and is still having itching and burning in vulva area, she was told to see GYN but they can't see her until July -she wants to know what she should do? She request a call back by 12:30pm this morning since she has home health coming at that time. Please call her at [phone] (M)

## 2024-02-25 NOTE — Telephone Encounter (Signed)
 Spoke with pt and she stated that her urogyn doctor called her this morning and recommended the same thing.

## 2024-02-28 ENCOUNTER — Encounter: Payer: Self-pay | Admitting: Podiatry

## 2024-02-28 ENCOUNTER — Ambulatory Visit: Admitting: Podiatry

## 2024-02-28 DIAGNOSIS — D2371 Other benign neoplasm of skin of right lower limb, including hip: Secondary | ICD-10-CM | POA: Diagnosis not present

## 2024-02-28 DIAGNOSIS — M79675 Pain in left toe(s): Secondary | ICD-10-CM | POA: Diagnosis not present

## 2024-02-28 DIAGNOSIS — D2372 Other benign neoplasm of skin of left lower limb, including hip: Secondary | ICD-10-CM | POA: Diagnosis not present

## 2024-02-28 DIAGNOSIS — B351 Tinea unguium: Secondary | ICD-10-CM | POA: Diagnosis not present

## 2024-02-28 DIAGNOSIS — M79674 Pain in right toe(s): Secondary | ICD-10-CM

## 2024-02-29 NOTE — Progress Notes (Signed)
 Subjective:  Patient ID: Brenda Cardenas, female    DOB: 1931/10/17,  MRN: [id number] HPI Chief Complaint  Patient presents with   Nail Problem    Requesting toenail trim-unable to trim herself, concerned about the broken nails hallux left, also collapsed arch left - Dr. Clydia Dart treated years ago   New Patient (Initial Visit)    Est pt 2019    89 y.o. female presents with the above complaint.   ROS: Denies fever chills nausea body muscle aches pains calf pain back pain chest pain shortness of breath.  Past Medical History:  Diagnosis Date   Arthritis    Basal cell carcinoma 06/04/2010   left upper back   Chronic kidney disease    Colon polyps    Gastritis    GERD (gastroesophageal reflux disease)    Hiatal hernia    Hyperlipidemia    Hypertension    IBS (irritable bowel syndrome)    Low back pain radiating to left leg 03/28/2020   Sciatica, left side 04/15/2020   Squamous cell carcinoma of skin 06/26/2013   left mid pretibial   Squamous cell carcinoma of skin 08/21/2015   lower sternum   Squamous cell carcinoma of skin 10/14/2017   right mid pretibial   Squamous cell carcinoma of skin 03/17/2018   right prox lateral elbow   Squamous cell carcinoma of skin 09/07/2018   right post thigh   Squamous cell carcinoma of skin 05/11/2019   right mid med pretibial   Squamous cell carcinoma of skin 06/20/2019   right bicep   Supraventricular tachycardia (HCC)    Past Surgical History:  Procedure Laterality Date   APPENDECTOMY  1959   CARPAL TUNNEL RELEASE     CATARACT EXTRACTION W/ INTRAOCULAR LENS IMPLANT Left    CATARACT EXTRACTION W/PHACO Right 04/20/2018   Procedure: CATARACT EXTRACTION PHACO AND INTRAOCULAR LENS PLACEMENT (IOC)  RIGHT;  Surgeon: Annell Kidney, MD;  Location: [facility];  Service: Ophthalmology;  Laterality: Right;   CHOLECYSTECTOMY  1995   COLONOSCOPY WITH PROPOFOL  N/A 07/30/2022   Procedure: COLONOSCOPY WITH PROPOFOL ;  Surgeon: Selena Daily, MD;  Location: [facility];  Service: Gastroenterology;  Laterality: N/A;   EYE SURGERY     GANGLION CYST EXCISION     JOINT REPLACEMENT Right 03/2008   [place]    KNEE ARTHROSCOPY     LITHOTRIPSY     SALIVARY GLAND SURGERY     SKIN CANCER EXCISION     TONSILLECTOMY     TONSILLECTOMY AND ADENOIDECTOMY     VEIN LIGATION      Current Outpatient Medications:    acetaminophen  (TYLENOL ) 500 MG tablet, Take 1,000 mg by mouth., Disp: , Rfl:    amiodarone  (PACERONE ) 200 MG tablet, TAKE ONE TABLET BY MOUTH EVERY DAY, Disp: 90 tablet, Rfl: 3   Cholecalciferol (VITAMIN D ) 50 MCG (2000 UT) CAPS, Take 1 capsule by mouth every other day., Disp: , Rfl:    CRANBERRY PO, Take 2 capsules by mouth daily. , Disp: , Rfl:    ezetimibe  (ZETIA ) 10 MG tablet, TAKE ONE TABLET BY MOUTH EVERY DAY, Disp: 90 tablet, Rfl: 3   hyoscyamine  (ANASPAZ ) 0.125 MG TBDP disintergrating tablet, Place 1 tablet (0.125 mg total) under the tongue every 6 (six) hours as needed (as needed for esophageal spasm)., Disp: 30 tablet, Rfl: 0   losartan  (COZAAR ) 50 MG tablet, TAKE ONE TABLET BY MOUTH AT BEDTIME, Disp: 30 tablet, Rfl: 2   metoprolol  succinate (TOPROL -XL) 25 MG 24 hr  tablet, TAKE ONE TABLET BY MOUTH EVERY DAY, Disp: 90 tablet, Rfl: 3   omeprazole  (PRILOSEC) 20 MG capsule, TAKE 1 CAPSULE BY MOUTH ONCE DAILY, Disp: 90 capsule, Rfl: 3   Probiotic Product (ALIGN PO), Take 1 tablet by mouth daily. , Disp: , Rfl:   Current Facility-Administered Medications:    denosumab  (PROLIA ) injection 60 mg, 60 mg, Subcutaneous, Q6 months, Tullo, Ray Caffey, MD, 60 mg at 01/31/24 0000   [START ON 08/02/2024] denosumab  (PROLIA ) injection 60 mg, 60 mg, Subcutaneous, Q6 months, Tullo, Ray Caffey, MD  Allergies  Allergen Reactions   Ciprofloxacin  Diarrhea   Clindamycin/Lincomycin Diarrhea   Sulfa Antibiotics Rash    Rash in throat   Augmentin [Amoxicillin-Pot Clavulanate] Diarrhea   Gabapentin  Other (See Comments)    Irregular  heartbeat   Gemtesa [Vibegron] Diarrhea   Lidocaine      Other reaction(s): Not available   Naproxen  Sodium Other (See Comments)    Causes gastritis   Prednisone      Atrial Fibrillation   Tramadol Diarrhea    "upset stomach, headache, dizziness, drowsiness"   Codeine Nausea Only and Rash    Upset stomach   Doxycycline Hyclate Nausea And Vomiting and Nausea Only    Dry heaves.    E-Mycin [Erythromycin] Rash   Macrobid [Nitrofurantoin] Rash    severe   Nabumetone Rash and Other (See Comments)    Upsets liver enzymes   Nitrofurantoin Monohyd Macro Rash   Nsaids Rash    Gastritis, GI ulceration.    Phenobarbital Other (See Comments), Rash and Anxiety    "got wild" Patient becomes very hyper and anxious   Statins Rash    Upsets liver enzymes   Review of Systems Objective:  There were no vitals filed for this visit.  General: Well developed, nourished, in no acute distress, alert and oriented x3   Dermatological: Skin is warm, dry and supple bilateral. Nails x 10 thick yellow dystrophic clinically mycotic; remaining integument appears unremarkable at this time. There are no open sores, no preulcerative lesions, no rash or signs of infection present.  Vascular: Dorsalis Pedis artery and Posterior Tibial artery pedal pulses are 2/4 bilateral with immedate capillary fill time. Pedal hair growth present. No varicosities and no lower extremity edema present bilateral.   Neruologic: Grossly intact via light touch bilateral. Vibratory intact via tuning fork bilateral. Protective threshold with Semmes Wienstein monofilament intact to all pedal sites bilateral. Patellar and Achilles deep tendon reflexes 2+ bilateral. No Babinski or clonus noted bilateral.   Musculoskeletal: No gross boney pedal deformities bilateral. No pain, crepitus, or limitation noted with foot and ankle range of motion bilateral. Muscular strength 5/5 in all groups tested bilateral.  Rigid hammertoe deformities  bilaterally.  Collapsed arch left foot minimally functioning posterior tibial tendon left  Gait: Unassisted, Nonantalgic.    Radiographs:  None taken  Assessment & Plan:   Assessment: Pain limb secondary to onychomycosis hammertoe deformities and collapse left foot  Plan: Debridement of toenails 1 through 5 bilateral continue to wear good supportive shoe gear.     Trevaris Pennella T. [place], [place]

## 2024-03-21 DIAGNOSIS — R32 Unspecified urinary incontinence: Secondary | ICD-10-CM | POA: Diagnosis not present

## 2024-03-21 DIAGNOSIS — N9489 Other specified conditions associated with female genital organs and menstrual cycle: Secondary | ICD-10-CM | POA: Diagnosis not present

## 2024-03-21 DIAGNOSIS — L258 Unspecified contact dermatitis due to other agents: Secondary | ICD-10-CM | POA: Diagnosis not present

## 2024-03-21 DIAGNOSIS — R300 Dysuria: Secondary | ICD-10-CM | POA: Diagnosis not present

## 2024-03-24 ENCOUNTER — Other Ambulatory Visit: Payer: Self-pay | Admitting: Internal Medicine

## 2024-03-24 DIAGNOSIS — I10 Essential (primary) hypertension: Secondary | ICD-10-CM

## 2024-03-30 NOTE — Telephone Encounter (Signed)
 LMTCB. Need to let pt know that she is overdue for her 6 month follow up with Dr Marylynn. Please schedule this appt when pt returns the call to the office.

## 2024-04-04 DIAGNOSIS — Z96651 Presence of right artificial knee joint: Secondary | ICD-10-CM | POA: Diagnosis not present

## 2024-04-13 NOTE — Telephone Encounter (Signed)
 error

## 2024-04-14 ENCOUNTER — Ambulatory Visit (INDEPENDENT_AMBULATORY_CARE_PROVIDER_SITE_OTHER): Payer: Medicare PPO | Admitting: Vascular Surgery

## 2024-04-14 ENCOUNTER — Encounter (INDEPENDENT_AMBULATORY_CARE_PROVIDER_SITE_OTHER): Payer: Self-pay | Admitting: Vascular Surgery

## 2024-04-14 VITALS — BP 105/64 | HR 67 | Resp 16 | Ht 60.0 in | Wt 113.8 lb

## 2024-04-14 DIAGNOSIS — I83893 Varicose veins of bilateral lower extremities with other complications: Secondary | ICD-10-CM

## 2024-04-14 DIAGNOSIS — E782 Mixed hyperlipidemia: Secondary | ICD-10-CM | POA: Diagnosis not present

## 2024-04-14 DIAGNOSIS — I10 Essential (primary) hypertension: Secondary | ICD-10-CM | POA: Diagnosis not present

## 2024-04-14 NOTE — Progress Notes (Signed)
 MRN : [id number]  Brenda Cardenas is a 89 y.o. (August 29, 1932) female who presents with chief complaint of  Chief Complaint  Patient presents with   Follow-up    6 month follow up   .  History of Present Illness: Patient returns today in follow up of leg swelling and venous disease.  She has done an outstanding job of wearing compression garments and elevating her legs and this has significantly reduced her swelling.  Her facility has an amazingly good job of wrapping her legs multiple times a week to get the swelling under control initially.  She has noticed some prominent varicosities in the left lower extremity but nothing tender or red.  These are mostly in the medial calf and thigh area.  No open wounds or infection.  No fevers or chills.  Current Outpatient Medications  Medication Sig Dispense Refill   acetaminophen  (TYLENOL ) 500 MG tablet Take 1,000 mg by mouth.     amiodarone  (PACERONE ) 200 MG tablet TAKE ONE TABLET BY MOUTH EVERY DAY 90 tablet 3   Cholecalciferol  (VITAMIN D ) 50 MCG (2000 UT) CAPS Take 1 capsule by mouth every other day.     CRANBERRY PO Take 2 capsules by mouth daily.      ezetimibe  (ZETIA ) 10 MG tablet TAKE ONE TABLET BY MOUTH EVERY DAY 90 tablet 3   hyoscyamine  (ANASPAZ ) 0.125 MG TBDP disintergrating tablet Place 1 tablet (0.125 mg total) under the tongue every 6 (six) hours as needed (as needed for esophageal spasm). 30 tablet 0   losartan  (COZAAR ) 50 MG tablet TAKE ONE TABLET BY MOUTH AT BEDTIME 30 tablet 0   metoprolol  succinate (TOPROL -XL) 25 MG 24 hr tablet TAKE ONE TABLET BY MOUTH EVERY DAY 90 tablet 3   omeprazole  (PRILOSEC) 20 MG capsule TAKE 1 CAPSULE BY MOUTH ONCE DAILY 90 capsule 3   Probiotic Product (ALIGN PO) Take 1 tablet by mouth daily.      Current Facility-Administered Medications  Medication Dose Route Frequency Provider Last Rate Last Admin   denosumab  (PROLIA ) injection 60 mg  60 mg Subcutaneous Q6 months Marylynn Verneita CROME, MD   60 mg at  01/31/24 0000   [START ON 08/02/2024] denosumab  (PROLIA ) injection 60 mg  60 mg Subcutaneous Q6 months Marylynn Verneita CROME, MD        Past Medical History:  Diagnosis Date   Arthritis    Basal cell carcinoma 06/04/2010   left upper back   Chronic kidney disease    Colon polyps    Gastritis    GERD (gastroesophageal reflux disease)    Hiatal hernia    Hyperlipidemia    Hypertension    IBS (irritable bowel syndrome)    Low back pain radiating to left leg 03/28/2020   Sciatica, left side 04/15/2020   Squamous cell carcinoma of skin 06/26/2013   left mid pretibial   Squamous cell carcinoma of skin 08/21/2015   lower sternum   Squamous cell carcinoma of skin 10/14/2017   right mid pretibial   Squamous cell carcinoma of skin 03/17/2018   right prox lateral elbow   Squamous cell carcinoma of skin 09/07/2018   right post thigh   Squamous cell carcinoma of skin 05/11/2019   right mid med pretibial   Squamous cell carcinoma of skin 06/20/2019   right bicep   Supraventricular tachycardia (HCC)     Past Surgical History:  Procedure Laterality Date   APPENDECTOMY  1959   CARPAL TUNNEL RELEASE     CATARACT  EXTRACTION W/ INTRAOCULAR LENS IMPLANT Left    CATARACT EXTRACTION W/PHACO Right 04/20/2018   Procedure: CATARACT EXTRACTION PHACO AND INTRAOCULAR LENS PLACEMENT (IOC)  RIGHT;  Surgeon: Mittie Gaskin, MD;  Location: [facility];  Service: Ophthalmology;  Laterality: Right;   CHOLECYSTECTOMY  1995   COLONOSCOPY WITH PROPOFOL  N/A 07/30/2022   Procedure: COLONOSCOPY WITH PROPOFOL ;  Surgeon: Unk Corinn Skiff, MD;  Location: [facility];  Service: Gastroenterology;  Laterality: N/A;   EYE SURGERY     GANGLION CYST EXCISION     JOINT REPLACEMENT Right 03/2008   [place]    KNEE ARTHROSCOPY     LITHOTRIPSY     SALIVARY GLAND SURGERY     SKIN CANCER EXCISION     TONSILLECTOMY     TONSILLECTOMY AND ADENOIDECTOMY     VEIN LIGATION       Social History   Tobacco  Use   Smoking status: Never   Smokeless tobacco: Never  Vaping Use   Vaping status: Never Used  Substance Use Topics   Alcohol use: No    Alcohol/week: 0.0 standard drinks of alcohol   Drug use: No       Family History  Problem Relation Age of Onset   Heart disease Mother    Arthritis Mother    Diabetes Father      Allergies  Allergen Reactions   Ciprofloxacin  Diarrhea   Clindamycin/Lincomycin Diarrhea   Sulfa Antibiotics Rash    Rash in throat   Augmentin [Amoxicillin-Pot Clavulanate] Diarrhea   Gabapentin  Other (See Comments)    Irregular heartbeat   Gemtesa [Vibegron] Diarrhea   Lidocaine      Other reaction(s): Not available   Naproxen  Sodium Other (See Comments)    Causes gastritis   Prednisone      Atrial Fibrillation   Tramadol Diarrhea    upset stomach, headache, dizziness, drowsiness   Codeine Nausea Only and Rash    Upset stomach   Doxycycline Hyclate Nausea And Vomiting and Nausea Only    Dry heaves.    E-Mycin [Erythromycin] Rash   Macrobid [Nitrofurantoin] Rash    severe   Nabumetone Rash and Other (See Comments)    Upsets liver enzymes   Nitrofurantoin Monohyd Macro Rash   Nsaids Rash    Gastritis, GI ulceration.    Phenobarbital Other (See Comments), Rash and Anxiety    got wild Patient becomes very hyper and anxious   Statins Rash    Upsets liver enzymes     REVIEW OF SYSTEMS (Negative unless checked)   Constitutional: [] Weight loss  [] Fever  [] Chills Cardiac: [] Chest pain   [] Chest pressure   [x] Palpitations   [] Shortness of breath when laying flat   [] Shortness of breath at rest   [x] Shortness of breath with exertion. Vascular:  [] Pain in legs with walking   [] Pain in legs at rest   [] Pain in legs when laying flat   [] Claudication   [] Pain in feet when walking  [] Pain in feet at rest  [] Pain in feet when laying flat   [] History of DVT   [] Phlebitis   [x] Swelling in legs   [x] Varicose veins   [] Non-healing ulcers Pulmonary:   [] Uses  home oxygen   [] Productive cough   [] Hemoptysis   [] Wheeze  [] COPD   [] Asthma Neurologic:  [] Dizziness  [] Blackouts   [] Seizures   [] History of stroke   [] History of TIA  [] Aphasia   [] Temporary blindness   [] Dysphagia   [] Weakness or numbness in arms   [] Weakness or numbness  in legs Musculoskeletal:  [x] Arthritis   [] Joint swelling   [x] Joint pain   [] Low back pain Hematologic:  [] Easy bruising  [] Easy bleeding   [] Hypercoagulable state   [] Anemic  [] Hepatitis Gastrointestinal:  [] Blood in stool   [] Vomiting blood  [] Gastroesophageal reflux/heartburn   [] Abdominal pain Genitourinary:  [] Chronic kidney disease   [] Difficult urination  [] Frequent urination  [] Burning with urination   [] Hematuria Skin:  [] Rashes   [] Ulcers   [] Wounds Psychological:  [] History of anxiety   []  History of major depression.   Physical Examination  BP 105/64 (BP Location: Left Arm, Patient Position: Sitting, Cuff Size: Small)   Pulse 67   Resp 16   Ht 5' (1.524 m)   Wt 113 lb 12.8 oz (51.6 kg)   BMI 22.23 kg/m  Gen:  WD/WN, NAD. Appears younger than stated age.  Marked scoliosis. Head: [place]/AT, No temporalis wasting. Ear/Nose/Throat: Hearing grossly intact, nares w/o erythema or drainage Eyes: Conjunctiva clear. Sclera non-icteric Neck: Supple.  Trachea midline Pulmonary:  Good air movement, no use of accessory muscles.  Cardiac: RRR, no JVD Vascular:  Vessel Right Left  Radial Palpable Palpable               Musculoskeletal: M/S 5/5 throughout.  No deformity or atrophy.  Prominent varicosities more on the left leg than the right.  Trace bilateral lower extremity edema. Neurologic: Sensation grossly intact in extremities.  Symmetrical.  Speech is fluent.  Psychiatric: Judgment intact, Mood & affect appropriate for pt's clinical situation. Dermatologic: No rashes or ulcers noted.  No cellulitis or open wounds.      Labs Recent Results (from the past 2160 hours)  VITAMIN D  25 Hydroxy (Vit-D  Deficiency, Fractures)     Status: None   Collection Time: 01/31/24  1:38 PM  Result Value Ref Range   VITD 34.39 30.00 - 100.00 ng/mL  POCT Urinalysis Dipstick (Automated)     Status: Abnormal   Collection Time: 02/18/24 11:30 AM  Result Value Ref Range   Color, UA Dark Yellow    Clarity, UA cloudy    Glucose, UA Negative Negative   Bilirubin, UA negative    Ketones, UA negative    Spec Grav, UA >=1.030 (A) 1.010 - 1.025   Blood, UA large (A)    pH, UA 6.0 5.0 - 8.0   Protein, UA Positive (A) Negative   Urobilinogen, UA 0.2 0.2 or 1.0 E.U./dL   Nitrite, UA negative    Leukocytes, UA Small (1+) (A) Negative  Urine Culture     Status: None   Collection Time: 02/18/24 11:34 AM   Specimen: Urine  Result Value Ref Range   MICRO NUMBER: [id number]    SPECIMEN QUALITY: Adequate    Sample Source URINE    STATUS: FINAL    Result:      Less than 10,000 CFU/mL of single Gram negative organism isolated. No further testing will be performed. If clinically indicated, recollection using a method to minimize contamination, with prompt transfer to Urine Culture Transport Tube, is recommended.  Urinalysis, Routine w reflex microscopic     Status: Abnormal   Collection Time: 02/18/24 11:34 AM  Result Value Ref Range   Color, Urine YELLOW Yellow;Lt. Yellow;Straw;Dark Yellow;Amber;Green;Red;Brown   APPearance CLEAR Clear;Turbid;Slightly Cloudy;Cloudy   Specific Gravity, Urine 1.025 1.000 - 1.030   pH 6.0 5.0 - 8.0   Total Protein, Urine 100 (A) Negative   Urine Glucose NEGATIVE Negative   Ketones, ur NEGATIVE Negative   Bilirubin Urine  NEGATIVE Negative   Hgb urine dipstick LARGE (A) Negative   Urobilinogen, UA 0.2 0.0 - 1.0   Leukocytes,Ua SMALL (A) Negative   Nitrite NEGATIVE Negative   WBC, UA 11-20/hpf (A) 0-2/hpf   RBC / HPF TNTC(>50/hpf) (A) 0-2/hpf   Squamous Epithelial / HPF Rare(0-4/hpf) Rare(0-4/hpf)   Bacteria, UA Rare(<10/hpf) (A) None    Radiology No results  found.  Assessment/Plan  Varicose veins of bilateral lower extremities with other complications She is getting excellent control with compression socks, elevation, and activity.  No role for intervention for any venous disease at this point.  She has had multiple previous interventions in the past and does have some residual venous disease, but at this point conservative management is working well.  Follow-up in 6 months.   Essential hypertension blood pressure control important in reducing the progression of atherosclerotic disease. On appropriate oral medications.     Mixed hyperlipidemia lipid control important in reducing the progression of atherosclerotic disease.    Selinda Gu, MD  04/14/2024 12:38 PM    This note was created with Dragon medical transcription system.  Any errors from dictation are purely unintentional

## 2024-04-15 NOTE — Addendum Note (Signed)
 Addended by: MAREA SELINDA RAMAN on: 04/15/2024 06:15 PM   Modules accepted: Level of Service

## 2024-04-17 DIAGNOSIS — W19XXXA Unspecified fall, initial encounter: Secondary | ICD-10-CM | POA: Diagnosis not present

## 2024-04-17 DIAGNOSIS — R42 Dizziness and giddiness: Secondary | ICD-10-CM | POA: Diagnosis not present

## 2024-04-18 ENCOUNTER — Observation Stay
Admission: EM | Admit: 2024-04-18 | Discharge: 2024-04-19 | Disposition: A | Discharge status: Home Health | Attending: Internal Medicine | Admitting: Internal Medicine

## 2024-04-18 ENCOUNTER — Observation Stay
Admit: 2024-04-18 | Discharge: 2024-04-18 | Disposition: A | Discharge status: Home / Self Care | Attending: Family Medicine

## 2024-04-18 ENCOUNTER — Emergency Department

## 2024-04-18 ENCOUNTER — Observation Stay

## 2024-04-18 ENCOUNTER — Other Ambulatory Visit: Payer: Self-pay

## 2024-04-18 DIAGNOSIS — Z7982 Long term (current) use of aspirin: Secondary | ICD-10-CM | POA: Insufficient documentation

## 2024-04-18 DIAGNOSIS — J9811 Atelectasis: Secondary | ICD-10-CM | POA: Diagnosis not present

## 2024-04-18 DIAGNOSIS — I700 Atherosclerosis of aorta: Secondary | ICD-10-CM | POA: Diagnosis not present

## 2024-04-18 DIAGNOSIS — N390 Urinary tract infection, site not specified: Secondary | ICD-10-CM

## 2024-04-18 DIAGNOSIS — I639 Cerebral infarction, unspecified: Secondary | ICD-10-CM | POA: Diagnosis not present

## 2024-04-18 DIAGNOSIS — Z79899 Other long term (current) drug therapy: Secondary | ICD-10-CM | POA: Insufficient documentation

## 2024-04-18 DIAGNOSIS — S12100A Unspecified displaced fracture of second cervical vertebra, initial encounter for closed fracture: Secondary | ICD-10-CM | POA: Diagnosis not present

## 2024-04-18 DIAGNOSIS — R42 Dizziness and giddiness: Secondary | ICD-10-CM | POA: Diagnosis not present

## 2024-04-18 DIAGNOSIS — Z043 Encounter for examination and observation following other accident: Secondary | ICD-10-CM | POA: Diagnosis not present

## 2024-04-18 DIAGNOSIS — E785 Hyperlipidemia, unspecified: Secondary | ICD-10-CM | POA: Diagnosis not present

## 2024-04-18 DIAGNOSIS — I7774 Dissection of vertebral artery: Principal | ICD-10-CM | POA: Insufficient documentation

## 2024-04-18 DIAGNOSIS — I63441 Cerebral infarction due to embolism of right cerebellar artery: Secondary | ICD-10-CM

## 2024-04-18 DIAGNOSIS — H532 Diplopia: Principal | ICD-10-CM | POA: Diagnosis present

## 2024-04-18 DIAGNOSIS — I672 Cerebral atherosclerosis: Secondary | ICD-10-CM | POA: Diagnosis not present

## 2024-04-18 DIAGNOSIS — Z85828 Personal history of other malignant neoplasm of skin: Secondary | ICD-10-CM | POA: Insufficient documentation

## 2024-04-18 DIAGNOSIS — M6281 Muscle weakness (generalized): Secondary | ICD-10-CM | POA: Diagnosis not present

## 2024-04-18 DIAGNOSIS — N179 Acute kidney failure, unspecified: Secondary | ICD-10-CM | POA: Diagnosis not present

## 2024-04-18 DIAGNOSIS — S0990XA Unspecified injury of head, initial encounter: Secondary | ICD-10-CM | POA: Diagnosis not present

## 2024-04-18 DIAGNOSIS — I10 Essential (primary) hypertension: Secondary | ICD-10-CM | POA: Diagnosis present

## 2024-04-18 DIAGNOSIS — W19XXXA Unspecified fall, initial encounter: Secondary | ICD-10-CM | POA: Diagnosis not present

## 2024-04-18 DIAGNOSIS — I63521 Cerebral infarction due to unspecified occlusion or stenosis of right anterior cerebral artery: Secondary | ICD-10-CM | POA: Insufficient documentation

## 2024-04-18 DIAGNOSIS — Z7902 Long term (current) use of antithrombotics/antiplatelets: Secondary | ICD-10-CM | POA: Diagnosis not present

## 2024-04-18 DIAGNOSIS — G319 Degenerative disease of nervous system, unspecified: Secondary | ICD-10-CM | POA: Diagnosis not present

## 2024-04-18 DIAGNOSIS — K219 Gastro-esophageal reflux disease without esophagitis: Secondary | ICD-10-CM | POA: Diagnosis not present

## 2024-04-18 DIAGNOSIS — M542 Cervicalgia: Secondary | ICD-10-CM | POA: Diagnosis not present

## 2024-04-18 DIAGNOSIS — R519 Headache, unspecified: Secondary | ICD-10-CM | POA: Insufficient documentation

## 2024-04-18 DIAGNOSIS — R29700 NIHSS score 0: Secondary | ICD-10-CM

## 2024-04-18 DIAGNOSIS — R29818 Other symptoms and signs involving the nervous system: Secondary | ICD-10-CM | POA: Diagnosis not present

## 2024-04-18 DIAGNOSIS — I6782 Cerebral ischemia: Secondary | ICD-10-CM | POA: Diagnosis not present

## 2024-04-18 LAB — URINALYSIS, W/ REFLEX TO CULTURE (INFECTION SUSPECTED)
Bilirubin Urine: NEGATIVE
Glucose, UA: NEGATIVE mg/dL
Ketones, ur: NEGATIVE mg/dL
Nitrite: NEGATIVE
Protein, ur: 100 mg/dL — AB
RBC / HPF: >50 RBC/hpf (ref 0–5)
Specific Gravity, Urine: 1.02 (ref 1.005–1.030)
WBC, UA: >50 WBC/hpf (ref 0–5)
pH: 5 (ref 5.0–8.0)

## 2024-04-18 LAB — CBC WITH DIFFERENTIAL/PLATELET
Abs Immature Granulocytes: 0.08 K/uL — ABNORMAL HIGH (ref 0.00–0.07)
Basophils Absolute: 0.1 K/uL (ref 0.0–0.1)
Basophils Relative: 0 %
Eosinophils Absolute: 0.2 K/uL (ref 0.0–0.5)
Eosinophils Relative: 1 %
HCT: 34.9 % — ABNORMAL LOW (ref 36.0–46.0)
Hemoglobin: 11.4 g/dL — ABNORMAL LOW (ref 12.0–15.0)
Immature Granulocytes: 1 %
Lymphocytes Relative: 10 %
Lymphs Abs: 1.7 K/uL (ref 0.7–4.0)
MCH: 32.1 pg (ref 26.0–34.0)
MCHC: 32.7 g/dL (ref 30.0–36.0)
MCV: 98.3 fL (ref 80.0–100.0)
Monocytes Absolute: 0.7 K/uL (ref 0.1–1.0)
Monocytes Relative: 4 %
Neutro Abs: 14.3 K/uL — ABNORMAL HIGH (ref 1.7–7.7)
Neutrophils Relative %: 84 %
Platelets: 143 K/uL — ABNORMAL LOW (ref 150–400)
RBC: 3.55 MIL/uL — ABNORMAL LOW (ref 3.87–5.11)
RDW: 20.2 % — ABNORMAL HIGH (ref 11.5–15.5)
WBC: 16.9 K/uL — ABNORMAL HIGH (ref 4.0–10.5)
nRBC: 0.1 % (ref 0.0–0.2)

## 2024-04-18 LAB — TROPONIN I (HIGH SENSITIVITY): Troponin I (High Sensitivity): 7 ng/L (ref <18)

## 2024-04-18 LAB — BASIC METABOLIC PANEL WITH GFR
Anion gap: 8 (ref 5–15)
BUN: 32 mg/dL — ABNORMAL HIGH (ref 8–23)
CO2: 24 mmol/L (ref 22–32)
Calcium: 8.5 mg/dL — ABNORMAL LOW (ref 8.9–10.3)
Chloride: 108 mmol/L (ref 98–111)
Creatinine, Ser: 1 mg/dL (ref 0.44–1.00)
GFR, Estimated: 54 mL/min — ABNORMAL LOW (ref >60)
Glucose, Bld: 103 mg/dL — ABNORMAL HIGH (ref 70–99)
Potassium: 4 mmol/L (ref 3.5–5.1)
Sodium: 140 mmol/L (ref 135–145)

## 2024-04-18 LAB — ECHOCARDIOGRAM COMPLETE BUBBLE STUDY
AR max vel: 2.66 cm2
AV Area VTI: 2.48 cm2
AV Area mean vel: 2.38 cm2
AV Mean grad: 4 mmHg
AV Peak grad: 6.9 mmHg
Ao pk vel: 1.31 m/s
Area-P 1/2: 3.37 cm2
Calc EF: 69.9 %
MV M vel: 2.1 m/s
MV Peak grad: 17.6 mmHg
MV VTI: 2.87 cm2
S' Lateral: 3.5 cm
Single Plane A2C EF: 76.1 %
Single Plane A4C EF: 60 %

## 2024-04-18 LAB — CBC
HCT: 32.9 % — ABNORMAL LOW (ref 36.0–46.0)
Hemoglobin: 10.6 g/dL — ABNORMAL LOW (ref 12.0–15.0)
MCH: 32.6 pg (ref 26.0–34.0)
MCHC: 32.2 g/dL (ref 30.0–36.0)
MCV: 101.2 fL — ABNORMAL HIGH (ref 80.0–100.0)
Platelets: 139 K/uL — ABNORMAL LOW (ref 150–400)
RBC: 3.25 MIL/uL — ABNORMAL LOW (ref 3.87–5.11)
RDW: 20.1 % — ABNORMAL HIGH (ref 11.5–15.5)
WBC: 15.9 K/uL — ABNORMAL HIGH (ref 4.0–10.5)
nRBC: 0 % (ref 0.0–0.2)

## 2024-04-18 LAB — COMPREHENSIVE METABOLIC PANEL WITH GFR
ALT: 20 U/L (ref 0–44)
AST: 30 U/L (ref 15–41)
Albumin: 3.4 g/dL — ABNORMAL LOW (ref 3.5–5.0)
Alkaline Phosphatase: 65 U/L (ref 38–126)
Anion gap: 11 (ref 5–15)
BUN: 37 mg/dL — ABNORMAL HIGH (ref 8–23)
CO2: 21 mmol/L — ABNORMAL LOW (ref 22–32)
Calcium: 9.2 mg/dL (ref 8.9–10.3)
Chloride: 106 mmol/L (ref 98–111)
Creatinine, Ser: 1.17 mg/dL — ABNORMAL HIGH (ref 0.44–1.00)
GFR, Estimated: 44 mL/min — ABNORMAL LOW (ref >60)
Glucose, Bld: 124 mg/dL — ABNORMAL HIGH (ref 70–99)
Potassium: 4.2 mmol/L (ref 3.5–5.1)
Sodium: 138 mmol/L (ref 135–145)
Total Bilirubin: 0.9 mg/dL (ref 0.0–1.2)
Total Protein: 6.2 g/dL — ABNORMAL LOW (ref 6.5–8.1)

## 2024-04-18 LAB — LIPID PANEL
Cholesterol: 133 mg/dL (ref 0–200)
HDL: 47 mg/dL (ref >40)
LDL Cholesterol: 65 mg/dL (ref 0–99)
Total CHOL/HDL Ratio: 2.8 ratio
Triglycerides: 104 mg/dL (ref <150)
VLDL: 21 mg/dL (ref 0–40)

## 2024-04-18 LAB — HEMOGLOBIN A1C
Hgb A1c MFr Bld: 4.8 % (ref 4.8–5.6)
Mean Plasma Glucose: 91.06 mg/dL

## 2024-04-18 MED ORDER — VITAMIN D 25 MCG (1000 UNIT) PO TABS
2000.0000 [IU] | ORAL_TABLET | ORAL | Status: DC
  Administered 2024-04-18: 2000 [IU] via ORAL
  Filled 2024-04-18: qty 2

## 2024-04-18 MED ORDER — SODIUM CHLORIDE 0.9 % IV BOLUS
1000.0000 mL | Freq: Once | INTRAVENOUS | Status: AC
  Administered 2024-04-18: 1000 mL via INTRAVENOUS

## 2024-04-18 MED ORDER — ACETAMINOPHEN 650 MG RE SUPP: 650.0000 mg | Freq: Four times a day (QID) | RECTAL | Status: DC | PRN

## 2024-04-18 MED ORDER — CRANBERRY 250 MG PO TABS: ORAL_TABLET | Freq: Every day | ORAL | Status: DC

## 2024-04-18 MED ORDER — STROKE: EARLY STAGES OF RECOVERY BOOK: Freq: Once | Status: AC

## 2024-04-18 MED ORDER — ASPIRIN 81 MG PO TBEC: 81.0000 mg | DELAYED_RELEASE_TABLET | Freq: Every day | ORAL | Status: DC

## 2024-04-18 MED ORDER — SODIUM CHLORIDE 0.9 % IV SOLN
1.0000 g | INTRAVENOUS | Status: DC
  Administered 2024-04-18: 1 g via INTRAVENOUS
  Filled 2024-04-18: qty 10

## 2024-04-18 MED ORDER — EZETIMIBE 10 MG PO TABS
10.0000 mg | ORAL_TABLET | Freq: Every day | ORAL | Status: DC
  Administered 2024-04-18 – 2024-04-19 (×2): 10 mg via ORAL
  Filled 2024-04-18 (×2): qty 1

## 2024-04-18 MED ORDER — TRAZODONE HCL 50 MG PO TABS: 25.0000 mg | ORAL_TABLET | Freq: Every evening | ORAL | Status: DC | PRN

## 2024-04-18 MED ORDER — HYOSCYAMINE SULFATE 0.125 MG PO TBDP: 0.1250 mg | ORAL_TABLET | Freq: Four times a day (QID) | ORAL | Status: DC | PRN

## 2024-04-18 MED ORDER — ONDANSETRON HCL 4 MG/2ML IJ SOLN
4.0000 mg | Freq: Four times a day (QID) | INTRAMUSCULAR | Status: DC | PRN
  Administered 2024-04-18: 4 mg via INTRAVENOUS
  Filled 2024-04-18: qty 2

## 2024-04-18 MED ORDER — MAGNESIUM HYDROXIDE 400 MG/5ML PO SUSP: 30.0000 mL | Freq: Every day | ORAL | Status: DC | PRN

## 2024-04-18 MED ORDER — ENSURE PLUS HIGH PROTEIN PO LIQD
237.0000 mL | Freq: Two times a day (BID) | ORAL | Status: DC
  Administered 2024-04-18 – 2024-04-19 (×2): 237 mL via ORAL

## 2024-04-18 MED ORDER — PANTOPRAZOLE SODIUM 40 MG PO TBEC
40.0000 mg | DELAYED_RELEASE_TABLET | Freq: Every day | ORAL | Status: DC
  Administered 2024-04-18 – 2024-04-19 (×2): 40 mg via ORAL
  Filled 2024-04-18 (×2): qty 1

## 2024-04-18 MED ORDER — RISAQUAD PO CAPS
1.0000 | ORAL_CAPSULE | Freq: Every day | ORAL | Status: DC
  Administered 2024-04-18 – 2024-04-19 (×2): 1 via ORAL
  Filled 2024-04-18 (×2): qty 1

## 2024-04-18 MED ORDER — ONDANSETRON HCL 4 MG PO TABS: 4.0000 mg | ORAL_TABLET | Freq: Four times a day (QID) | ORAL | Status: DC | PRN

## 2024-04-18 MED ORDER — CLOPIDOGREL BISULFATE 75 MG PO TABS
75.0000 mg | ORAL_TABLET | Freq: Every day | ORAL | Status: DC
  Administered 2024-04-18 – 2024-04-19 (×2): 75 mg via ORAL
  Filled 2024-04-18 (×2): qty 1

## 2024-04-18 MED ORDER — SODIUM CHLORIDE 0.9 % IV SOLN: INTRAVENOUS | Status: AC

## 2024-04-18 MED ORDER — ORAL CARE MOUTH RINSE: 15.0000 mL | OROMUCOSAL | Status: DC | PRN

## 2024-04-18 MED ORDER — LOSARTAN POTASSIUM 50 MG PO TABS: 50.0000 mg | ORAL_TABLET | Freq: Every day | ORAL | Status: DC

## 2024-04-18 MED ORDER — IOHEXOL 350 MG/ML SOLN
75.0000 mL | Freq: Once | INTRAVENOUS | Status: AC | PRN
  Administered 2024-04-18: 75 mL via INTRAVENOUS

## 2024-04-18 MED ORDER — AMIODARONE HCL 200 MG PO TABS
200.0000 mg | ORAL_TABLET | Freq: Every day | ORAL | Status: DC
  Administered 2024-04-18 – 2024-04-19 (×2): 200 mg via ORAL
  Filled 2024-04-18 (×2): qty 1

## 2024-04-18 MED ORDER — ACETAMINOPHEN 325 MG PO TABS: 650.0000 mg | ORAL_TABLET | Freq: Four times a day (QID) | ORAL | Status: DC | PRN

## 2024-04-18 MED ORDER — METOPROLOL SUCCINATE ER 50 MG PO TB24
25.0000 mg | ORAL_TABLET | Freq: Every day | ORAL | Status: DC
  Administered 2024-04-18: 25 mg via ORAL
  Filled 2024-04-18: qty 1

## 2024-04-18 MED ORDER — ENOXAPARIN SODIUM 30 MG/0.3ML IJ SOSY
30.0000 mg | PREFILLED_SYRINGE | INTRAMUSCULAR | Status: DC
  Administered 2024-04-18 – 2024-04-19 (×2): 30 mg via SUBCUTANEOUS
  Filled 2024-04-18 (×2): qty 0.3

## 2024-04-18 NOTE — Assessment & Plan Note (Signed)
Will continue Zetia.

## 2024-04-18 NOTE — Progress Notes (Signed)
 Occupational Therapy Evaluation Patient Details Name: Brenda Cardenas MRN: [id number] DOB: [birth year] Today's Date: 04/18/2024   History of Present Illness   Pt is a 89 y.o. female with PMH: Fall, osteoarthritis, GERD, hypertension, dyslipidemia, Hiatal Hernia, gastritis, IBS who presented to the emergency room with acute onset lightheadedness with dizziness and a fall.  Pt found to have 2 acute strokes on MRI upon admission.     Clinical Impressions Ms Gainey was seen for OT evaluation this date. Prior to hospital admission, pt was IND w/ ADLs. Pt lives at [facility]. Pt presents to acute OT demonstrating impaired ADL performance and functional mobility 2/2 decreased activity tolerance and impaired standing balance (See OT problem list for additional functional deficits). Pt currently requires SETUP for self-feeding. Pt declines any mobility citing fatigue from previous procedures/ambulating to bathroom w/ staff. Pt endorses improvement in diplopia and described compensatory strategies for improved vision.    Pt would benefit from skilled OT services to address noted impairments and functional limitations (see below for any additional details) in order to maximize safety and independence while minimizing falls risk and caregiver burden. Recommend return to prior living situation pending OOB participation. Anticipate the need for follow up OT services upon acute hospital DC.      If plan is discharge home, recommend the following:   A little help with walking and/or transfers;A little help with bathing/dressing/bathroom;Assist for transportation     Functional Status Assessment   Patient has had a recent decline in their functional status and demonstrates the ability to make significant improvements in function in a reasonable and predictable amount of time.     Equipment Recommendations   None recommended by OT     Recommendations for Other Services          Precautions/Restrictions   Precautions Precautions: Fall Recall of Precautions/Restrictions: Intact Restrictions Weight Bearing Restrictions Per Provider Order: No     Mobility Bed Mobility               General bed mobility comments: refused mobility    Transfers                   General transfer comment: refused OOB mobility      Balance                                           ADL either performed or assessed with clinical judgement   ADL Overall ADL's : Needs assistance/impaired                                       General ADL Comments: SETUP for self-feeding     Vision   Vision Assessment?: Yes Diplopia Assessment: Disappears with one eye closed;Objects split on top of one another     Perception         Praxis         Pertinent Vitals/Pain Pain Assessment Pain Assessment: No/denies pain     Extremity/Trunk Assessment Upper Extremity Assessment Upper Extremity Assessment: Overall WFL for tasks assessed   Lower Extremity Assessment Lower Extremity Assessment: Generalized weakness LLE Deficits / Details: L LE with more weakness compared to the R side.       Communication Communication Communication: Impaired Factors Affecting Communication: Hearing impaired  Cognition Arousal: Alert Behavior During Therapy: WFL for tasks assessed/performed Cognition: No apparent impairments                               Following commands: Intact       Cueing  General Comments   Cueing Techniques: Verbal cues;Gestural cues  deferred at this time.   Exercises     Shoulder Instructions      Home Living Family/patient expects to be discharged to:: Assisted living                             Home Equipment: [place] - single point;Shower seat;Grab bars - toilet;Grab bars - tub/shower;Other (comment) (3 wheeled walker)          Prior Functioning/Environment Prior  Level of Function : Independent/Modified Independent;History of Falls (last six months)                    OT Problem List: Decreased strength;Decreased activity tolerance;Impaired balance (sitting and/or standing);Impaired vision/perception   OT Treatment/Interventions: Self-care/ADL training;Therapeutic exercise;Energy conservation;DME and/or AE instruction;Therapeutic activities;Patient/family education      OT Goals(Current goals can be found in the care plan section)   Acute Rehab OT Goals Patient Stated Goal: to go home OT Goal Formulation: With patient Time For Goal Achievement: 05/02/24 Potential to Achieve Goals: Good ADL Goals Pt Will Perform Grooming: with supervision;sitting;standing Pt Will Perform Lower Body Dressing: with supervision;sitting/lateral leans;sit to/from stand Pt Will Perform Tub/Shower Transfer: with supervision;ambulating;shower seat Additional ADL Goal #1: Pt will verbalize x3 falls prevention strategies in 3/3 trials.   OT Frequency:  Min 2X/week    Co-evaluation              AM-PAC OT 6 Clicks Daily Activity     Outcome Measure Help from another person eating meals?: None Help from another person taking care of personal grooming?: None Help from another person toileting, which includes using toliet, bedpan, or urinal?: A Little Help from another person bathing (including washing, rinsing, drying)?: A Lot Help from another person to put on and taking off regular upper body clothing?: A Little Help from another person to put on and taking off regular lower body clothing?: A Little 6 Click Score: 19   End of Session    Activity Tolerance: Patient limited by fatigue Patient left: in bed;with call bell/phone within reach;with bed alarm set  OT Visit Diagnosis: Unsteadiness on feet (R26.81);Other abnormalities of gait and mobility (R26.89);Repeated falls (R29.6);Muscle weakness (generalized) (M62.81);Other symptoms and signs  involving the nervous system (R29.898)                Time: 8682-8654 OT Time Calculation (min): 28 min Charges:  OT General Charges $OT Visit: 1 Visit OT Treatments $Self Care/Home Management : 8-22 mins  [place], Student OT   [employer] 04/18/2024, 2:56 PM

## 2024-04-18 NOTE — Assessment & Plan Note (Signed)
Will continue antihypertensive therapy.

## 2024-04-18 NOTE — Assessment & Plan Note (Addendum)
-   Will place the patient on IV Rocephin . - We will follow urine culture results.

## 2024-04-18 NOTE — Plan of Care (Signed)
 This patient is admitted overnight to [facility]. The patient is AA+Ox4. NIHSS = 0 (Q 4 hours assessment). No supplemental O2 requirement. Telemetry monitoring in place. Of note, the patient declined the ordered ASA scheduled overnight; Dr. Cleatus notified. MIVF via PIV for now, expiring at 0400 hours. Ambulatory with walker and 1 person assisting.  04/18/24: 1915 hours report received from 1C RN Brooke. 1935 hours: Update received via Secure Chat from ED RN Rosina.  Problem: Education: Goal: Knowledge of disease or condition will improve Outcome: Progressing Goal: Knowledge of secondary prevention will improve (MUST DOCUMENT ALL) Outcome: Progressing Goal: Knowledge of patient specific risk factors will improve (DELETE if not current risk factor) Outcome: Progressing   Problem: Ischemic Stroke/TIA Tissue Perfusion: Goal: Complications of ischemic stroke/TIA will be minimized Outcome: Progressing   Problem: Coping: Goal: Will verbalize positive feelings about self Outcome: Progressing Goal: Will identify appropriate support needs Outcome: Progressing   Problem: Health Behavior/Discharge Planning: Goal: Ability to manage health-related needs will improve Outcome: Progressing Goal: Goals will be collaboratively established with patient/family Outcome: Progressing   Problem: Self-Care: Goal: Ability to participate in self-care as condition permits will improve Outcome: Progressing Goal: Verbalization of feelings and concerns over difficulty with self-care will improve Outcome: Progressing Goal: Ability to communicate needs accurately will improve Outcome: Progressing   Problem: Nutrition: Goal: Risk of aspiration will decrease Outcome: Progressing Goal: Dietary intake will improve Outcome: Progressing   Problem: Education: Goal: Knowledge of General Education information will improve Description: Including pain rating scale, medication(s)/side effects and non-pharmacologic comfort  measures Outcome: Progressing   Problem: Health Behavior/Discharge Planning: Goal: Ability to manage health-related needs will improve Outcome: Progressing   Problem: Clinical Measurements: Goal: Ability to maintain clinical measurements within normal limits will improve Outcome: Progressing Goal: Will remain free from infection Outcome: Progressing Goal: Diagnostic test results will improve Outcome: Progressing Goal: Respiratory complications will improve Outcome: Progressing Goal: Cardiovascular complication will be avoided Outcome: Progressing   Problem: Activity: Goal: Risk for activity intolerance will decrease Outcome: Progressing   Problem: Nutrition: Goal: Adequate nutrition will be maintained Outcome: Progressing   Problem: Coping: Goal: Level of anxiety will decrease Outcome: Progressing   Problem: Elimination: Goal: Will not experience complications related to bowel motility Outcome: Progressing Goal: Will not experience complications related to urinary retention Outcome: Progressing   Problem: Pain Managment: Goal: General experience of comfort will improve and/or be controlled Outcome: Progressing   Problem: Safety: Goal: Ability to remain free from injury will improve Outcome: Progressing   Problem: Skin Integrity: Goal: Risk for impaired skin integrity will decrease Outcome: Progressing

## 2024-04-18 NOTE — H&P (Addendum)
 [place]   PATIENT NAME: Brenda Cardenas    MR#:  [id number]  DATE OF BIRTH:  04-Sep-1932  DATE OF ADMISSION:  04/18/2024  PRIMARY CARE PHYSICIAN: Marylynn Verneita CROME, MD   Patient is coming from: [facility] independent living.  REQUESTING/REFERRING PHYSICIAN: Margaretann Kirsch, MD  CHIEF COMPLAINT:   Chief Complaint  Patient presents with   Fall   Dizziness    HISTORY OF PRESENT ILLNESS:  Brenda Cardenas is a 89 y.o. Caucasian female with medical history significant for osteoarthritis, GERD, hypertension, dyslipidemia, hiatal hernia, IBS who presented to the emergency room with acute onset lightheadedness with dizziness and a fall.  The patient was bending over in the bathroom to pick up a dead bug when she fell.  She stated that she fell forward hitting her head.  She was able to get up on her own and ambulate with her walker.  Around 6 PM she started having double vision with both of her eyes open with mild headache.  She denies any dysarthria or dysphagia.  No paresthesias or focal muscle weakness.  She admits to urinary frequency and urgency with dysuria.  No hematuria or flank pain.  No chest pain or palpitations.  No cough or wheezing or dyspnea.  ED Course: Upon presentation to the ER, vital signs were within normal.  Labs revealed cytosis of 16.9 with neutrophilia and mild anemia with thrombocytopenia 143.  CMP revealed a BUN of 37 and creatinine 1.17 above previous levels with CO2 of 21 and albumin 3.4, total protein of 6.2 and high sensitive troponin I was 7.  UA was positive for UTI. EKG as reviewed by me : EKG showed normal sinus rhythm with a rate of 77 with left anterior fascicular block. Imaging: Portable chest x-ray showed mild left basilar atelectasis with no acute process. Noncontrast head CT scan showed no acute intracranial normality.  Showed atrophy with small vessel ischemic changes.  C-spine CT showed no acute traumatic injury.  It showed chronic type II dens  fracture.  CTA of the head and neck revealed the following: 1. No large vessel occlusion, hemodynamically significant stenosis, or aneurysm in the head or neck. 2. Mild atherosclerotic calcifications in the distal aortic arch and descending thoracic aorta without aneurysm or dissection. 3. Mild atherosclerotic changes at the carotid bifurcations and cavernous internal carotid arteries bilaterally without significant stenosis.  The patient was given 1 L bolus of IV normal saline and I added IV Rocephin .  She will be admitted to a medical telemetry observation bed for further evaluation and management.  PAST MEDICAL HISTORY:   Past Medical History:  Diagnosis Date   Arthritis    Basal cell carcinoma 06/04/2010   left upper back   Chronic kidney disease    Colon polyps    Gastritis    GERD (gastroesophageal reflux disease)    Hiatal hernia    Hyperlipidemia    Hypertension    IBS (irritable bowel syndrome)    Low back pain radiating to left leg 03/28/2020   Sciatica, left side 04/15/2020   Squamous cell carcinoma of skin 06/26/2013   left mid pretibial   Squamous cell carcinoma of skin 08/21/2015   lower sternum   Squamous cell carcinoma of skin 10/14/2017   right mid pretibial   Squamous cell carcinoma of skin 03/17/2018   right prox lateral elbow   Squamous cell carcinoma of skin 09/07/2018   right post thigh   Squamous cell carcinoma of skin 05/11/2019  right mid med pretibial   Squamous cell carcinoma of skin 06/20/2019   right bicep   Supraventricular tachycardia (HCC)     PAST SURGICAL HISTORY:   Past Surgical History:  Procedure Laterality Date   APPENDECTOMY  1959   CARPAL TUNNEL RELEASE     CATARACT EXTRACTION W/ INTRAOCULAR LENS IMPLANT Left    CATARACT EXTRACTION W/PHACO Right 04/20/2018   Procedure: CATARACT EXTRACTION PHACO AND INTRAOCULAR LENS PLACEMENT (IOC)  RIGHT;  Surgeon: Mittie Gaskin, MD;  Location: [facility];  Service:  Ophthalmology;  Laterality: Right;   CHOLECYSTECTOMY  1995   COLONOSCOPY WITH PROPOFOL  N/A 07/30/2022   Procedure: COLONOSCOPY WITH PROPOFOL ;  Surgeon: Unk Corinn Skiff, MD;  Location: [facility] END[facility]COPY;  Service: Gastroenterology;  Laterality: N/A;   EYE SURGERY     GANGLION CYST EXCISION     JOINT REPLACEMENT Right 03/2008   [place]    KNEE ARTHROSCOPY     LITHOTRIPSY     SALIVARY GLAND SURGERY     SKIN CANCER EXCISION     TONSILLECTOMY     TONSILLECTOMY AND ADENOIDECTOMY     VEIN LIGATION      SOCIAL HISTORY:   Social History   Tobacco Use   Smoking status: Never   Smokeless tobacco: Never  Substance Use Topics   Alcohol use: No    Alcohol/week: 0.0 standard drinks of alcohol    FAMILY HISTORY:   Family History  Problem Relation Age of Onset   Heart disease Mother    Arthritis Mother    Diabetes Father     DRUG ALLERGIES:   Allergies  Allergen Reactions   Ciprofloxacin  Diarrhea   Clindamycin/Lincomycin Diarrhea   Sulfa Antibiotics Rash    Rash in throat   Augmentin [Amoxicillin-Pot Clavulanate] Diarrhea   Gabapentin  Other (See Comments)    Irregular heartbeat   Gemtesa [Vibegron] Diarrhea   Lidocaine      Other reaction(s): Not available   Naproxen  Sodium Other (See Comments)    Causes gastritis   Prednisone      Atrial Fibrillation   Tramadol Diarrhea    upset stomach, headache, dizziness, drowsiness   Codeine Nausea Only and Rash    Upset stomach   Doxycycline Hyclate Nausea And Vomiting and Nausea Only    Dry heaves.    E-Mycin [Erythromycin] Rash   Macrobid [Nitrofurantoin] Rash    severe   Nabumetone Rash and Other (See Comments)    Upsets liver enzymes   Nitrofurantoin Monohyd Macro Rash   Nsaids Rash    Gastritis, GI ulceration.    Phenobarbital Other (See Comments), Rash and Anxiety    got wild Patient becomes very hyper and anxious   Statins Rash    Upsets liver enzymes    REVIEW OF SYSTEMS:   ROS As per history of present  illness. All pertinent systems were reviewed above. Constitutional, HEENT, cardiovascular, respiratory, GI, GU, musculoskeletal, neuro, psychiatric, endocrine, integumentary and hematologic systems were reviewed and are otherwise negative/unremarkable except for positive findings mentioned above in the HPI.   MEDICATIONS AT HOME:   Prior to Admission medications   Medication Sig Start Date End Date Taking? Authorizing Provider  acetaminophen  (TYLENOL ) 500 MG tablet Take 1,000 mg by mouth.    [provider]  amiodarone  (PACERONE ) 200 MG tablet TAKE ONE TABLET BY MOUTH EVERY DAY 10/22/23   Marylynn Verneita CROME, MD  Cholecalciferol  (VITAMIN D ) 50 MCG (2000 UT) CAPS Take 1 capsule by mouth every other day.    [provider]  CRANBERRY PO Take 2 capsules by mouth daily.     [provider]  ezetimibe  (ZETIA ) 10 MG tablet TAKE ONE TABLET BY MOUTH EVERY DAY 02/09/24   Marylynn Verneita CROME, MD  hyoscyamine  (ANASPAZ ) 0.125 MG TBDP disintergrating tablet Place 1 tablet (0.125 mg total) under the tongue every 6 (six) hours as needed (as needed for esophageal spasm). 01/11/20   Marylynn Verneita CROME, MD  losartan  (COZAAR ) 50 MG tablet TAKE ONE TABLET BY MOUTH AT BEDTIME 03/30/24   Marylynn Verneita CROME, MD  metoprolol  succinate (TOPROL -XL) 25 MG 24 hr tablet TAKE ONE TABLET BY MOUTH EVERY DAY 09/29/23   End, Lonni, MD  omeprazole  (PRILOSEC) 20 MG capsule TAKE 1 CAPSULE BY MOUTH ONCE DAILY 01/28/24   Tullo, Teresa L, MD  Probiotic Product (ALIGN PO) Take 1 tablet by mouth daily.     [provider]      VITAL SIGNS:  Blood pressure (!) 157/71, pulse 63, temperature 98.1 F (36.7 C), temperature source Oral, resp. rate 16, height 5' (1.524 m), weight 51.3 kg, SpO2 99%.  PHYSICAL EXAMINATION:  Physical Exam  GENERAL:  89 y.o.-year-old Caucasian female patient lying in the bed with no acute distress.  EYES: Pupils equal, round, reactive to light and accommodation. No scleral icterus.  Extraocular muscles intact.  HEENT: Head atraumatic, normocephalic. Oropharynx and nasopharynx clear.  NECK:  Supple, no jugular venous distention. No thyroid  enlargement, no tenderness.  LUNGS: Normal breath sounds bilaterally, no wheezing, rales,rhonchi or crepitation. No use of accessory muscles of respiration.  CARDIOVASCULAR: Regular rate and rhythm, S1, S2 normal. No murmurs, rubs, or gallops.  ABDOMEN: Soft, nondistended, nontender. Bowel sounds present. No organomegaly or mass.  EXTREMITIES: No pedal edema, cyanosis, or clubbing.  NEUROLOGIC: Cranial nerves II through XII are intact except for binocular diplopia without nystagmus. Muscle strength 5/5 in all extremities. Sensation intact. Gait not checked.  PSYCHIATRIC: The patient is alert and oriented x 3.  Normal affect and good eye contact. SKIN: No obvious rash, lesion, or ulcer.   LABORATORY PANEL:   CBC Recent Labs  Lab 04/18/24 0423  WBC 15.9*  HGB 10.6*  HCT 32.9*  PLT 139*   ------------------------------------------------------------------------------------------------------------------  Chemistries  Recent Labs  Lab 04/18/24 0025 04/18/24 0423  NA 138 140  K 4.2 4.0  CL 106 108  CO2 21* 24  GLUCOSE 124* 103*  BUN 37* 32*  CREATININE 1.17* 1.00  CALCIUM 9.2 8.5*  AST 30  --   ALT 20  --   ALKPHOS 65  --   BILITOT 0.9  --    ------------------------------------------------------------------------------------------------------------------  Cardiac Enzymes No results for input(s): TROPONINI in the last 168 hours. ------------------------------------------------------------------------------------------------------------------  RADIOLOGY:  MR BRAIN WO CONTRAST Result Date: 04/18/2024 EXAM: MRI BRAIN WITHOUT CONTRAST 04/18/2024 05:19:00 AM TECHNIQUE: Multiplanar multisequence MRI of the head/brain was performed without the administration of intravenous contrast. COMPARISON: CT head without contrast and  CT angio head and neck 04/18/2024. CLINICAL HISTORY: 89 y.o. female presents to the emergency department with double vision and mild headache approximately 6 hours after a fall. No slurring of speech or extremity numbness or weakness. Neuro deficit, acute, stroke suspected. FINDINGS: BRAIN AND VENTRICLES: 2 separate foci of restricted diffusion are present in the right cerebellum associated with T2 and FLAIR hyperintensities. No acute hemorrhage. No mass. No midline shift. No hydrocephalus. The sella is unremarkable. Normal flow voids. Generalized atrophy is within normal limits for age. Periventricular subcortical T2 hyperintensities are mildly advanced for age. ORBITS: Bilateral lens  replacements are noted. The globes and orbits are otherwise within normal limits. No acute abnormality. SINUSES AND MASTOIDS: No acute abnormality. BONES AND SOFT TISSUES: Normal marrow signal. No acute soft tissue abnormality. IMPRESSION: 1. 2 separate acute  right cerebellar infarcts. No acute hemorrhage. 2. Mildly advanced periventricular subcortical T2 hyperintensities for age. This might reflect the sequelae of chronic microvascular ischemia. Electronically signed by: Lonni Necessary MD 04/18/2024 05:32 AM EDT RP Workstation: [id number]   CT ANGIO HEAD NECK W WO CM Result Date: 04/18/2024 EXAM: CTA HEAD AND NECK WITH AND WITHOUT 04/18/2024 04:39:57 AM TECHNIQUE: CTA of the head and neck was performed with and without the administration of intravenous contrast. Multiplanar 2D and/or 3D reformatted images are provided for review. Automated exposure control, iterative reconstruction, and/or weight based adjustment of the mA/kV was utilized to reduce the radiation dose to as low as reasonably achievable. Stenosis of the internal carotid arteries measured using NASCET criteria. COMPARISON: CT head without contrast 04/18/2024 CLINICAL HISTORY: Fall and shower. Dizziness. Diplopia. Neuro deficit, acute, stroke suspected.  FINDINGS: CTA NECK: AORTIC ARCH AND ARCH VESSELS: Mild atherosclerotic calcifications are present in the distal aortic arch and descending thoracic aorta without aneurysm or dissection. CERVICAL CAROTID ARTERIES: Minimal calcifications are present at the right carotid bifurcation without significant stenosis. Minimal atherosclerotic calcifications are present at the left carotid bifurcation without focal stenosis. CERVICAL VERTEBRAL ARTERIES: The left vertebral artery is the dominant vessel. No significant stenosis is present in either vertebral artery in the neck. LUNGS AND MEDIASTINUM: Mild dependent atelectasis is present in the visualized lungs. SOFT TISSUES: No acute abnormality. BONES: Multilevel degenerative changes are present in the cervical spine. A remote nonunion type 2 dens fracture is stable. Grade 1 degenerative anterolisthesis at C3-4 and C4-5 is stable. No focal osseous lesions are present. CTA HEAD: ANTERIOR CIRCULATION: Atherosclerotic calcifications are present within the cavernous internal carotid arteries bilaterally without significant stenosis through the ICA terminus. The anterior communicating artery is patent. No significant stenosis, aneurysm or large vessel occlusion is present. POSTERIOR CIRCULATION: No significant stenosis of the posterior cerebral arteries. No significant stenosis of the basilar artery. No significant stenosis of the vertebral arteries. No aneurysm. OTHER: No dural venous sinus thrombosis on this non-dedicated study. IMPRESSION: 1. No large vessel occlusion, hemodynamically significant stenosis, or aneurysm in the head or neck. 2. Mild atherosclerotic calcifications in the distal aortic arch and descending thoracic aorta without aneurysm or dissection. 3. Mild atherosclerotic changes at the carotid bifurcations and cavernous internal carotid arteries bilaterally without significant stenosis. Electronically signed by: Lonni Necessary MD 04/18/2024 04:54 AM EDT RP  Workstation: [id number]   CT Head Wo Contrast Result Date: 04/18/2024 EXAM: CT HEAD AND CERVICAL SPINE 04/18/2024 12:50:26 AM TECHNIQUE: CT of the head and cervical spine was performed without the administration of intravenous contrast. Multiplanar reformatted images are provided for review. Automated exposure control, iterative reconstruction, and/or weight based adjustment of the mA/kV was utilized to reduce the radiation dose to as low as reasonably achievable. COMPARISON: 05/09/2023 CLINICAL HISTORY: Head trauma, minor (Age >= 65y). Patient arrives EMS from [facility]. Patient states at lunch time she was picking up something in her bathroom and fell in the shower. After dinner she started complaining of double vision, neck pain, dizziness. LKW: Right before fall. Fall occurred around 1200. 159/79, 94HR, 93% on room air. FINDINGS: CT HEAD BRAIN AND VENTRICLES: No acute intracranial hemorrhage. No mass effect or midline shift. No abnormal extra-axial fluid collection. Gray-white differentiation is maintained. No  hydrocephalus. Global cortical atrophy. Subcortical and periventricular small vessel ischemic changes. Intracranial atherosclerosis. ORBITS: No acute abnormality. SINUSES AND MASTOIDS: No acute abnormality. SOFT TISSUES AND SKULL: No acute skull fracture. No acute soft tissue abnormality. CT CERVICAL SPINE BONES AND ALIGNMENT: Chronic type 2 dense fracture. No acute fracture. DEGENERATIVE CHANGES: 4 mm anterolisthesis of C4 on C5. Mild degenerative changes of the mid cervical spine. SOFT TISSUES: No prevertebral soft tissue swelling. IMPRESSION: 1. No acute intracranial abnormality. Atrophy with small vessel ischemic changes. 2. No acute traumatic injury to the cervical spine. Chronic type 2 dens fracture. Electronically signed by: Pinkie Pebbles MD 04/18/2024 12:54 AM EDT RP Workstation: [id number]   CT Cervical Spine Wo Contrast Result Date: 04/18/2024 EXAM: CT HEAD AND CERVICAL  SPINE 04/18/2024 12:50:26 AM TECHNIQUE: CT of the head and cervical spine was performed without the administration of intravenous contrast. Multiplanar reformatted images are provided for review. Automated exposure control, iterative reconstruction, and/or weight based adjustment of the mA/kV was utilized to reduce the radiation dose to as low as reasonably achievable. COMPARISON: 05/09/2023 CLINICAL HISTORY: Head trauma, minor (Age >= 65y). Patient arrives EMS from [facility]. Patient states at lunch time she was picking up something in her bathroom and fell in the shower. After dinner she started complaining of double vision, neck pain, dizziness. LKW: Right before fall. Fall occurred around 1200. 159/79, 94HR, 93% on room air. FINDINGS: CT HEAD BRAIN AND VENTRICLES: No acute intracranial hemorrhage. No mass effect or midline shift. No abnormal extra-axial fluid collection. Gray-white differentiation is maintained. No hydrocephalus. Global cortical atrophy. Subcortical and periventricular small vessel ischemic changes. Intracranial atherosclerosis. ORBITS: No acute abnormality. SINUSES AND MASTOIDS: No acute abnormality. SOFT TISSUES AND SKULL: No acute skull fracture. No acute soft tissue abnormality. CT CERVICAL SPINE BONES AND ALIGNMENT: Chronic type 2 dense fracture. No acute fracture. DEGENERATIVE CHANGES: 4 mm anterolisthesis of C4 on C5. Mild degenerative changes of the mid cervical spine. SOFT TISSUES: No prevertebral soft tissue swelling. IMPRESSION: 1. No acute intracranial abnormality. Atrophy with small vessel ischemic changes. 2. No acute traumatic injury to the cervical spine. Chronic type 2 dens fracture. Electronically signed by: Pinkie Pebbles MD 04/18/2024 12:54 AM EDT RP Workstation: [id number]   DG Chest Portable 1 View Result Date: 04/18/2024 EXAM: 1 VIEW XRAY OF THE CHEST 04/18/2024 12:32:35 AM COMPARISON: 01/29/2023 CLINICAL HISTORY: Fall. Patient arrives EMS from [facility] Patient states at lunch time she was picking up something in her bathroom and fell in the shower. After dinner she started complaining of double vision, neck pain, dizziness. LKW: Right before fall. Fall occurred around 1200. 159/79, 94HR, 93% on room air. FINDINGS: LUNGS AND PLEURA: Mild left basilar atelectasis. No focal pulmonary opacity. No pulmonary edema. No pleural effusion. No pneumothorax. HEART AND MEDIASTINUM: No acute abnormality of the cardiac and mediastinal silhouettes. Small hiatal hernia. BONES AND SOFT TISSUES: S-shaped thoracolumbar scoliosis. Left shoulder degenerative changes. No acute osseous abnormality. IMPRESSION: 1. No acute process. 2. Mild left basilar atelectasis. Electronically signed by: Pinkie Pebbles MD 04/18/2024 12:40 AM EDT RP Workstation: [id number]      IMPRESSION AND PLAN:  Assessment and Plan: * Diplopia - The patient will be admitted to an observation medically monitored bed.   - We will follow neuro checks q.4 hours for 24 hours.   - The patient will be placed on Plavix . - Will obtain a brain MRI without contrast as well as CTA of the head and neck and 2D echo  with bubble study .   - A neurology consultation  as well as physical/occupation/speech therapy consults will be obtained in a.m..  I notified Dr. Jerrie about the patient - The patient will be placed on statin therapy and fasting lipids will be checked.   AKI (acute kidney injury) (HCC) -This is likely prerenal due to mild dehydration. - The patient will be hydrated with IV normal saline and will follow BMP.  Acute lower UTI - Will place the patient on IV Rocephin . - We will follow urine culture results.  Dyslipidemia - Will continue Zetia .  Essential hypertension - Will continue antihypertensive therapy.  GERD without esophagitis - Will continue PPI therapy.   DVT prophylaxis: Lovenox . Advanced Care Planning:  Code Status: full code. Family Communication:   The plan of care was discussed in details with the patient (and family). I answered all questions. The patient agreed to proceed with the above mentioned plan. Further management will depend upon hospital course. Disposition Plan: Back to previous home environment Consults called: Neurology All the records are reviewed and case discussed with ED provider.  Status is: Observation  I certify that at the time of admission, it is my clinical judgment that the patient will require  hospital care extending less than 2 midnights.                            Dispo: The patient is from: [facility]              Anticipated d/c is to: [facility]              Patient currently is not medically stable to d/c.              Difficult to place patient: No  Madison DELENA Peaches M.D on 04/18/2024 at 6:49 AM  [facility]   From 7 PM-7 AM, contact night-coverage [email or web address]  CC: Primary care physician; Marylynn Verneita CROME, MD

## 2024-04-18 NOTE — Progress Notes (Addendum)
       CROSS COVER NOTE  NAME: Brenda Cardenas MRN: [id number] DOB : 06-30-1932    Concern as stated by nurse / staff   FYI, new admit here with stroke; refusing her aspirin ...  She doesn't take it and doesn't believe she's had a stroke and isn't going to take it   Olita GORMAN Pizza, RN     Pertinent findings on chart review: Progress note reviewed: Briefly, patient admitted with acute cerebellar stroke presenting with diplopia and lightheadedness Initially started on Plavix  monotherapy on admission, aspirin  subsequently added  Patient Assessment   Assessment and  Interventions   Assessment:  Acute cerebellar stroke-DAPT recommended however patient refusing aspirin   Plan: Gentle encouragement recommended-nurse will approach her again later X X

## 2024-04-18 NOTE — Assessment & Plan Note (Addendum)
-   The patient will be admitted to an observation medically monitored bed.   - We will follow neuro checks q.4 hours for 24 hours.   - The patient will be placed on Plavix . - Will obtain a brain MRI without contrast as well as CTA of the head and neck and 2D echo with bubble study .   - A neurology consultation  as well as physical/occupation/speech therapy consults will be obtained in a.m..  I notified Dr. Jerrie about the patient - The patient will be placed on statin therapy and fasting lipids will be checked.

## 2024-04-18 NOTE — ED Notes (Signed)
 This nurse attempted to take pt to restroom but she requested to wait until she is in her room on the floor.

## 2024-04-18 NOTE — Assessment & Plan Note (Signed)
 Will continue PPI therapy.

## 2024-04-18 NOTE — ED Triage Notes (Addendum)
 Patient arrives EMS from [facility]. Patient states at lunch time she was picking up something in her bathroom and fell in the shower. After dinner she started complaining of double vision, neck pain, dizziness. Fall occurred around 1200. Patient ate dinner at 1700.  159/79, 94HR, 93% on room air.

## 2024-04-18 NOTE — ED Notes (Signed)
 CCMD called to place Pt on Cardiac monitoring.

## 2024-04-18 NOTE — ED Notes (Signed)
Informed RN bed assigned 

## 2024-04-18 NOTE — ED Provider Notes (Signed)
 [facility] Provider Note    Event Date/Time   First MD Initiated Contact with Patient 04/18/24 0010     (approximate)   History   Fall and Dizziness   HPI  Brenda Cardenas is a 89 y.o. female presents to the emergency department with double vision.  Patient states that she fell earlier today when she was bending over in the bathroom to pick up a dead bug.  States that it fell and then she fell forward hitting her head.  States that she was in her normal state of health and able to get up on her own.  She ambulates with a walker which is her normal.  States that at approximately 6 PM which is about 6 hours after her fall she started having double vision.  States that she is having double vision with both of her eyes open.  Mild headache.  No slurring of speech or extremity numbness or weakness.  Denies any chest pain or shortness of breath.     Physical Exam   Triage Vital Signs: ED Triage Vitals [04/18/24 0012]  Encounter Vitals Group     BP      Girls Systolic BP Percentile      Girls Diastolic BP Percentile      Boys Systolic BP Percentile      Boys Diastolic BP Percentile      Pulse      Resp      Temp      Temp src      SpO2      Weight 113 lb (51.3 kg)     Height 5' (1.524 m)     Head Circumference      Peak Flow      Pain Score      Pain Loc      Pain Education      Exclude from Growth Chart     Most recent vital signs: Vitals:   04/18/24 0030 04/18/24 0100  BP: 113/87 105/60  Pulse: 72 68  Resp: 12 19  Temp:    SpO2: 96% 95%    Physical Exam Constitutional:      Appearance: She is well-developed.  HENT:     Head: Atraumatic.  Eyes:     Extraocular Movements: Extraocular movements intact.     Conjunctiva/sclera: Conjunctivae normal.     Pupils: Pupils are equal, round, and reactive to light.  Cardiovascular:     Rate and Rhythm: Regular rhythm.  Pulmonary:     Effort: No respiratory distress.  Abdominal:     General:  There is no distension.     Tenderness: There is no abdominal tenderness.  Musculoskeletal:        General: Normal range of motion.     Cervical back: Normal range of motion and neck supple. Tenderness present.     Right lower leg: No edema.     Left lower leg: No edema.  Skin:    General: Skin is warm.  Neurological:     Mental Status: She is alert. Mental status is at baseline.     GCS: GCS eye subscore is 4. GCS verbal subscore is 5. GCS motor subscore is 6.     Cranial Nerves: Cranial nerves 2-12 are intact.     Sensory: Sensation is intact.     Motor: Motor function is intact.     Coordination: Coordination is intact.     Comments: Binocular diplopia.  No nystagmus  IMPRESSION / MDM / ASSESSMENT AND PLAN / ED COURSE  I reviewed the triage vital signs and the nursing notes.  Differential diagnosis including postconcussive syndrome, intracranial hemorrhage, CVA, dissection, vertigo, electrolyte abnormality, dehydration  EKG  I, Clotilda Punter, the attending physician, personally viewed and interpreted this ECG.  EKG showed normal sinus rhythm.  Normal intervals.  No chamber enlargement.  No significant change compared to prior EKG.  No findings of acute ischemia or dysrhythmia.  No tachycardic or bradycardic dysrhythmias while on cardiac telemetry.  RADIOLOGY I independently reviewed imaging, my interpretation of imaging: CT scan of the head with no signs of intracranial hemorrhage  CT scan of the cervical spine with no acute fracture  LABS (all labs ordered are listed, but only abnormal results are displayed) Labs interpreted as -    Labs Reviewed  CBC WITH DIFFERENTIAL/PLATELET - Abnormal; Notable for the following components:      Result Value   WBC 16.9 (*)    RBC 3.55 (*)    Hemoglobin 11.4 (*)    HCT 34.9 (*)    RDW 20.2 (*)    Platelets 143 (*)    Neutro Abs 14.3 (*)    Abs Immature Granulocytes 0.08 (*)    All other components within normal limits   COMPREHENSIVE METABOLIC PANEL WITH GFR - Abnormal; Notable for the following components:   CO2 21 (*)    Glucose, Bld 124 (*)    BUN 37 (*)    Creatinine, Ser 1.17 (*)    Total Protein 6.2 (*)    Albumin 3.4 (*)    GFR, Estimated 44 (*)    All other components within normal limits  URINALYSIS, W/ REFLEX TO CULTURE (INFECTION SUSPECTED)  TROPONIN I (HIGH SENSITIVITY)     MDM  Patient with leukocytosis of 16.9, no obvious source of an infectious process and did not have any preceding symptoms.  Mildly low platelets at 143.  Creatinine 1.17 which appears to be close to her baseline.  No significant electrolyte abnormality.  Troponin is negative, have low suspicion for ACS  CT scan of the head with no signs of intracranial hemorrhage or infarction.  CT scan of the supra spine with no acute fracture.  Chest x-ray no focal findings consistent with pneumonia.  Patient able to ambulate but continues to have ongoing binocular diplopia.  No nystagmus on exam.  Given that her onset of diplopia was approximately 6 hours after her fall have a lower suspicion for postconcussive syndrome.  Will give IV fluids.  Currently waiting for her urine to result.  Plan to admit to the hospitalist for further workup of binocular diplopia     PROCEDURES:  Critical Care performed: No  Procedures  Patient's presentation is most consistent with acute presentation with potential threat to life or bodily function.   MEDICATIONS ORDERED IN ED: Medications  sodium chloride  0.9 % bolus 1,000 mL (has no administration in time range)    FINAL CLINICAL IMPRESSION(S) / ED DIAGNOSES   Final diagnoses:  Fall, initial encounter  Injury of head, initial encounter  Diplopia     Rx / DC Orders   ED Discharge Orders     None        Note:  This document was prepared using Dragon voice recognition software and may include unintentional dictation errors.   Punter Clotilda, MD 04/18/24 [phone]

## 2024-04-18 NOTE — Progress Notes (Addendum)
 SLP Cancellation Note  Patient Details Name: Brenda Cardenas MRN: [id number] DOB: June 19, 1932   Cancelled treatment:       Reason Eval/Treat Not Completed: SLP screened, no needs identified, will sign off (chart reviewed; consulted NSG and met w/ pt in room post PT session- overheard appropriate verbal engagement during end of their session; pt followed instructions w/ cue.)  Per MD note at admit, pt  is a 89 y.o. Caucasian female with medical history significant for Fall, osteoarthritis, GERD, hypertension, dyslipidemia, Hiatal Hernia, gastritis, IBS who presented to the emergency room with acute onset lightheadedness with dizziness and a fall.  The patient was bending over in the bathroom to pick up a dead bug when she fell.  She stated that she fell forward hitting her head.  She was able to get up on her own and ambulate with her walker.  Around 6 PM she started having double vision with both of her eyes open with mild headache.  She denies any dysarthria or dysphagia.SABRA  MRI:  2 separate acute  right cerebellar infarcts. No acute hemorrhage.  2. Mildly advanced periventricular subcortical T2 hyperintensities for age- may reflect the sequelae of chronic microvascular ischemia.  Pt denied any difficulty swallowing and is currently on a regular diet; tolerates swallowing pills w/ water per NSG. Pt conversed in Full conversation w/out expressive/receptive deficits noted; pt denied any speech-language deficits. Speech clear. Pt appeared easily distracted during topic maintenance, but she is also [facility] which can impact engagement in conversation as well. Helped pt w/ choosing items from the Menu and placed Lunch order. Pt appreciative d/t limited interest in Menu items; Dietician alerted.   No further Acute skilled ST services indicated as pt appears at/near her communication/swallowing baseline per NSG report/observation/pt report. Recommended pt have f/u post D/C home if any concern of change from her  baseline when back in her own known setting. Pt agreed. NSG to reconsult if any change in status while admitted.     Comer Portugal, MS, CCC-SLP Speech Language Pathologist Rehab Services; [facility] [phone] (ascom) Heyli Min 04/18/2024, 12:52 PM

## 2024-04-18 NOTE — ED Notes (Signed)
 Pt verbally abusive to this RN and MRI tech, screaming and hollering and cursing at this RN. This RN asked Pt to stop cursing and yelling or I would give Pt some time as this RN is not going to tolerate verbal abuse. Pt calmed down and this RN helped Pt w/toileting.

## 2024-04-18 NOTE — Evaluation (Signed)
 Physical Therapy Evaluation Patient Details Name: Brenda Cardenas MRN: [id number] DOB: April 04, 1932 Today's Date: 04/18/2024  History of Present Illness  Pt is a 89 y.o. female with PMH: Fall, osteoarthritis, GERD, hypertension, dyslipidemia, Hiatal Hernia, gastritis, IBS who presented to the emergency room with acute onset lightheadedness with dizziness and a fall.  Pt found to have 2 acute strokes on MRI upon admission.    Clinical Impression  Pt received in Semi-Fowler's position and agreeable to therapy.  Pt noting to be fatigued from all the testing and mobility that she has been doing and elected to not perform bed mobility at this time.  Pt did undergo strength testing within the bed and is able to functional move from side to side throughout the session.  Pt does have increased weakness on the L LE, and pt reports part of that being attributed to her bad L knee.  Pt was ambulating with nursing staff prior to therapist arrival and therapist was able to see pt ambulating with forward flexed posture and good gait mechanics otherwise.  Pt slowed, but steady with ambulation.  Although not formally assessed, pt would likely be safe with use of AD at home along with HHPT services.  Will update note if pt is able to rest and perform bed mobility at later time.        If plan is discharge home, recommend the following: A little help with walking and/or transfers;A little help with bathing/dressing/bathroom;Help with stairs or ramp for entrance;Assist for transportation   Can travel by private vehicle        Equipment Recommendations None recommended by PT  Recommendations for Other Services       Functional Status Assessment Patient has had a recent decline in their functional status and demonstrates the ability to make significant improvements in function in a reasonable and predictable amount of time.     Precautions / Restrictions Precautions Precautions: Fall Restrictions Weight  Bearing Restrictions Per Provider Order: No      Mobility  Bed Mobility               General bed mobility comments: pt ultimately refused mobility at this point due to fatigue and having to perform so many tests lately.    Transfers                        Ambulation/Gait         Gait velocity: decreased     General Gait Details: Pt ambualted with nursing prior to therapist evaluation and therapist was able to witness forward flexed trunk when ambulating with use of a RW.  Pt steady with gait mechanics, and reprots being unable to stand tall due to her back at this time.  Stairs            Wheelchair Mobility     Tilt Bed    Modified Rankin (Stroke Patients Only)       Balance                                             Pertinent Vitals/Pain Pain Assessment Pain Assessment: No/denies pain    Home Living Family/patient expects to be discharged to:: Assisted living                 Home Equipment: Agricultural consultant (2 wheels);Cane - single point;Shower  seat;Grab bars - toilet;Grab bars - tub/shower      Prior Function Prior Level of Function : Independent/Modified Independent                     Extremity/Trunk Assessment   Upper Extremity Assessment Upper Extremity Assessment: Generalized weakness    Lower Extremity Assessment Lower Extremity Assessment: Generalized weakness;LLE deficits/detail LLE Deficits / Details: L LE with more weakness compared to the R side.       Communication   Communication Communication: No apparent difficulties Factors Affecting Communication: Hearing impaired    Cognition Arousal: Alert Behavior During Therapy: WFL for tasks assessed/performed   PT - Cognitive impairments: No apparent impairments                         Following commands: Intact       Cueing       General Comments General comments (skin integrity, edema, etc.): deferred at this  time.    Exercises     Assessment/Plan    PT Assessment Patient needs continued PT services  PT Problem List Decreased strength;Decreased activity tolerance;Decreased balance;Decreased mobility;Decreased coordination;Decreased knowledge of use of DME;Decreased safety awareness       PT Treatment Interventions DME instruction;Gait training;Stair training;Functional mobility training;Therapeutic activities;Therapeutic exercise;Balance training;Neuromuscular re-education;Patient/family education    PT Goals (Current goals can be found in the Care Plan section)  Acute Rehab PT Goals Patient Stated Goal: to return home and get vision back to normal. PT Goal Formulation: With patient Time For Goal Achievement: 05/02/24 Potential to Achieve Goals: Good    Frequency Min 2X/week     Co-evaluation               AM-PAC PT 6 Clicks Mobility  Outcome Measure Help needed turning from your back to your side while in a flat bed without using bedrails?: A Little Help needed moving from lying on your back to sitting on the side of a flat bed without using bedrails?: A Little Help needed moving to and from a bed to a chair (including a wheelchair)?: A Little Help needed standing up from a chair using your arms (e.g., wheelchair or bedside chair)?: A Little Help needed to walk in hospital room?: A Lot Help needed climbing 3-5 steps with a railing? : A Lot 6 Click Score: 16    End of Session   Activity Tolerance: Patient limited by fatigue Patient left: in bed;with bed alarm set   PT Visit Diagnosis: Unsteadiness on feet (R26.81);Other abnormalities of gait and mobility (R26.89);Muscle weakness (generalized) (M62.81);Repeated falls (R29.6);History of falling (Z91.81);Difficulty in walking, not elsewhere classified (R26.2)    Time: 8866-8848 PT Time Calculation (min) (ACUTE ONLY): 18 min   Charges:   PT Evaluation $PT Eval Low Complexity: 1 Low             Fonda Simpers,  PT, DPT Physical Therapist - [place]  [facility]  04/18/24, 1:57 PM

## 2024-04-18 NOTE — ED Notes (Signed)
 Patient was able to walk to the commode using a walker. This nurse stood next to the patient to assist if needed. Patient reports she is incontinent and wears a certain type of diaper. Patient does not want to use the bed pan. Patient is aware that the nurse needs to assist the patient with walking every time she needs to use the bathroom.

## 2024-04-18 NOTE — ED Notes (Signed)
 Fall precautions in place for Pt. This RN placed fall band,  pt declines wearingfall grip socks, bed alarm and fall sign.

## 2024-04-18 NOTE — Progress Notes (Signed)
  Progress Note   Patient: Brenda Cardenas FMW:[id number] DOB: [birth year] DOA: 04/18/2024     0 DOS: the patient was seen and examined on 04/18/2024   Brief hospital course: 89yo with h/o HTN and HLD who presented on 7/22 from [facility] with acute dizziness resulting in a fall.  MRI with 2 acute cerebellar infarcts.  Neurology was consulted.  Assessment and Plan:  Acute CVA Acute onset of diplopia and light-headedness resulting in a fall MRI confirms acute cerebellar infarct Telemetry observation (possibly change to inpatient) Neuro checks q.4 hours for 24 hours Likely needs DAPT - started on Plavix  monotherapy by admitter while awaiting neurology consult CTA reassuring 2D echo with bubble study with grade 1 DD and negative bubble Neurology consulted   Physical/occupation/speech therapy consulted She lives in [facility], may require short-term rehab but cerebellar strokes often recover quickly Persistent diplopia throughout my evaluation, requiring her to repeatedly close her R eye    Stage 3a CKD Appears to be stable at this time Attempt to avoid nephrotoxic medications Recheck BMP in AM   Abnormal UA No new urinary symptoms Stop Ceftriaxone  and follow clinically She does have chronic lichen planus and is planning to see urogyn as an outpatient   Dyslipidemia Continue Zetia  Has a reported statin allergy   Essential hypertension Hold losartan  and metoprolol  for permissive HTN   GERD without esophagitis Continue PPI       Consultants: Neurology PT OT  Procedures: Echocardiogram 7/22  Antibiotics: Ceftriaxone       Subjective: New onset R-sided diplopia.  Due to have a tooth implant today but called the dentist and told him of the diplopia and he told her to come in.  Physical Exam: Vitals:   04/18/24 0600 04/18/24 0630 04/18/24 0638 04/18/24 0708  BP: (!) 143/76 (!) 157/71    Pulse:  63    Resp: 12 16    Temp:   98.1 F (36.7 C)   TempSrc:   Oral   SpO2: 96% 99%   100%  Weight:      Height:       No intake or output data in the 24 hours ending 04/18/24 0851 Filed Weights   04/18/24 0012  Weight: 51.3 kg    Exam:  General:  Appears calm and comfortable and is in NAD Eyes:  R exotropia, keeps eye closed most of the time due to diplopia ENT:  grossly normal hearing, lips & tongue, mmm; absent mid-lower tooth Cardiovascular:  RRR. No LE edema.  Respiratory:   CTA bilaterally with no wheezes/rales/rhonchi.  Normal respiratory effort. Abdomen:  soft, NT, ND Skin:  no rash or induration seen on limited exam Musculoskeletal:  grossly normal tone BUE/BLE, good ROM, no bony abnormality Psychiatric:  grossly normal mood and affect, speech fluent and appropriate, AOx3, very loquacious Neurologic:  R exotropia with chronic R eye closure due to diplopia, moves all extremities in coordinated fashion  Data Reviewed: I have reviewed the patient's lab results since admission.  Pertinent labs for today include:   Stable BMP Lipids: 133/47/65/104 WBC 15.9 Hgb 10.6 Platelets 139 UA: large Hgb, small LE, 100 protein, many bacteria Urine culture pending    Family Communication: None present  Disposition: Status is: Observation The patient remains OBS appropriate and will d/c before 2 midnights.  Planned Discharge Destination: TBD    Time spent: 50 minutes  Author: Delon Herald, MD 04/18/2024 8:49 AM  For on call review [email or web address].

## 2024-04-18 NOTE — Consult Note (Signed)
 NEUROLOGY CONSULT NOTE   Date of service: April 18, 2024 Patient Name: Brenda Cardenas MRN:  [id number] DOB:  [birth year] Chief Complaint: dizziness, fall Requesting Provider: Barbarann Nest, MD  History of Present Illness  Brenda Cardenas is a 89 y.o. female with hx of scoliosis complicated by chronic pain and sciatica, hypertension, hyperlipidemia intolerant to statins due to liver enzyme elevations, hiatal hernia, left anterior fascicular block, supraventricular tachycardia,  She had a mechanical fall while in her bathroom at around noon on 7/21.  After that she was having significant neck pain and dizziness, however did not develop diplopia until eating dinner at around 5 PM.  At the time of my evaluation today reports this diplopia has essentially resolved and she does not feel any other symptoms  LKW: Noon on 7/21 Modified rankin score: 3-Moderate disability-requires help but walks WITHOUT assistance --uses a walker/rollator, gets home health, unable to drive, manages her own depends due to chronic incontinence but lives in an independent living area IV Thrombolysis: No, out of the window EVT: No, exam not consistent with LVO NIH stroke scale: 0   ROS  Comprehensive ROS performed and pertinent positives documented in HPI    Past History   Past Medical History:  Diagnosis Date   Arthritis    Basal cell carcinoma 06/04/2010   left upper back   Chronic kidney disease    Colon polyps    Gastritis    GERD (gastroesophageal reflux disease)    Hiatal hernia    Hyperlipidemia    Hypertension    IBS (irritable bowel syndrome)    Low back pain radiating to left leg 03/28/2020   Sciatica, left side 04/15/2020   Squamous cell carcinoma of skin 06/26/2013   left mid pretibial   Squamous cell carcinoma of skin 08/21/2015   lower sternum   Squamous cell carcinoma of skin 10/14/2017   right mid pretibial   Squamous cell carcinoma of skin 03/17/2018   right prox lateral elbow    Squamous cell carcinoma of skin 09/07/2018   right post thigh   Squamous cell carcinoma of skin 05/11/2019   right mid med pretibial   Squamous cell carcinoma of skin 06/20/2019   right bicep   Supraventricular tachycardia (HCC)     Past Surgical History:  Procedure Laterality Date   APPENDECTOMY  1959   CARPAL TUNNEL RELEASE     CATARACT EXTRACTION W/ INTRAOCULAR LENS IMPLANT Left    CATARACT EXTRACTION W/PHACO Right 04/20/2018   Procedure: CATARACT EXTRACTION PHACO AND INTRAOCULAR LENS PLACEMENT (IOC)  RIGHT;  Surgeon: Mittie Gaskin, MD;  Location: [facility];  Service: Ophthalmology;  Laterality: Right;   CHOLECYSTECTOMY  1995   COLONOSCOPY WITH PROPOFOL  N/A 07/30/2022   Procedure: COLONOSCOPY WITH PROPOFOL ;  Surgeon: Unk Corinn Skiff, MD;  Location: [facility];  Service: Gastroenterology;  Laterality: N/A;   EYE SURGERY     GANGLION CYST EXCISION     JOINT REPLACEMENT Right 03/2008   [facility]    KNEE ARTHROSCOPY     LITHOTRIPSY     SALIVARY GLAND SURGERY     SKIN CANCER EXCISION     TONSILLECTOMY     TONSILLECTOMY AND ADENOIDECTOMY     VEIN LIGATION      Family History: Family History  Problem Relation Age of Onset   Heart disease Mother    Arthritis Mother    Diabetes Father     Social History  reports that she has never smoked. She has never used smokeless tobacco.  She reports that she does not drink alcohol and does not use drugs.  Allergies  Allergen Reactions   Ciprofloxacin  Diarrhea   Clindamycin/Lincomycin Diarrhea   Sulfa Antibiotics Rash    Rash in throat   Augmentin [Amoxicillin-Pot Clavulanate] Diarrhea   Gabapentin  Other (See Comments)    Irregular heartbeat   Gemtesa [Vibegron] Diarrhea   Lidocaine      Other reaction(s): Not available   Naproxen  Sodium Other (See Comments)    Causes gastritis   Prednisone      Atrial Fibrillation   Tramadol Diarrhea    upset stomach, headache, dizziness, drowsiness   Codeine Nausea  Only and Rash    Upset stomach   Doxycycline Hyclate Nausea And Vomiting and Nausea Only    Dry heaves.    E-Mycin [Erythromycin] Rash   Macrobid [Nitrofurantoin] Rash    severe   Nabumetone Rash and Other (See Comments)    Upsets liver enzymes   Nitrofurantoin Monohyd Macro Rash   Nsaids Rash    Gastritis, GI ulceration.    Phenobarbital Other (See Comments), Rash and Anxiety    got wild Patient becomes very hyper and anxious   Statins Rash    Upsets liver enzymes    Medications   Current Facility-Administered Medications:    [START ON 04/19/2024]  stroke: early stages of recovery book, , Does not apply, Once, Mansy, Jan A, MD   0.9 %  sodium chloride  infusion, , Intravenous, Continuous, Mansy, Jan A, MD, Last Rate: 100 mL/hr at 04/18/24 0600, New Bag at 04/18/24 0600   acetaminophen  (TYLENOL ) tablet 650 mg, 650 mg, Oral, Q6H PRN **OR** acetaminophen  (TYLENOL ) suppository 650 mg, 650 mg, Rectal, Q6H PRN, Mansy, Jan A, MD   acidophilus (RISAQUAD) capsule 1 capsule, 1 capsule, Oral, Daily, Mansy, Jan A, MD, 1 capsule at 04/18/24 1017   amiodarone  (PACERONE ) tablet 200 mg, 200 mg, Oral, Daily, Mansy, Jan A, MD, 200 mg at 04/18/24 1017   cholecalciferol  (VITAMIN D3) 25 MCG (1000 UNIT) tablet 2,000 Units, 2,000 Units, Oral, QODAY, Mansy, Jan A, MD, 2,000 Units at 04/18/24 1017   clopidogrel  (PLAVIX ) tablet 75 mg, 75 mg, Oral, Daily, Mansy, Jan A, MD, 75 mg at 04/18/24 1018   denosumab  (PROLIA ) injection 60 mg, 60 mg, Subcutaneous, Q6 months, Tullo, Verneita CROME, MD, 60 mg at 01/31/24 0000   [START ON 08/02/2024] denosumab  (PROLIA ) injection 60 mg, 60 mg, Subcutaneous, Q6 months, Tullo, Verneita CROME, MD   enoxaparin  (LOVENOX ) injection 30 mg, 30 mg, Subcutaneous, Q24H, Mansy, Jan A, MD, 30 mg at 04/18/24 1019   ezetimibe  (ZETIA ) tablet 10 mg, 10 mg, Oral, Daily, Mansy, Jan A, MD, 10 mg at 04/18/24 1106   feeding supplement (ENSURE PLUS HIGH PROTEIN) liquid 237 mL, 237 mL, Oral, BID BM, Barbarann Nest, MD, 237 mL at 04/18/24 1357   hyoscyamine  (ANASPAZ ) disintergrating tablet 0.125 mg, 0.125 mg, Sublingual, Q6H PRN, Mansy, Jan A, MD   magnesium  hydroxide (MILK OF MAGNESIA) suspension 30 mL, 30 mL, Oral, Daily PRN, Mansy, Jan A, MD   ondansetron  (ZOFRAN ) tablet 4 mg, 4 mg, Oral, Q6H PRN **OR** ondansetron  (ZOFRAN ) injection 4 mg, 4 mg, Intravenous, Q6H PRN, Mansy, Jan A, MD   pantoprazole  (PROTONIX ) EC tablet 40 mg, 40 mg, Oral, Daily, Mansy, Jan A, MD, 40 mg at 04/18/24 1019   traZODone  (DESYREL ) tablet 25 mg, 25 mg, Oral, QHS PRN, Mansy, Jan A, MD  Current Outpatient Medications:    acetaminophen  (TYLENOL ) 500 MG tablet, Take 1,000 mg by mouth 2 (  two) times daily., Disp: , Rfl:    amiodarone  (PACERONE ) 200 MG tablet, TAKE ONE TABLET BY MOUTH EVERY DAY, Disp: 90 tablet, Rfl: 3   cephALEXin  (KEFLEX ) 250 MG capsule, Take 250 mg by mouth 2 (two) times daily., Disp: , Rfl:    Cholecalciferol  (VITAMIN D ) 50 MCG (2000 UT) CAPS, Take 1 capsule by mouth every other day., Disp: , Rfl:    CRANBERRY PO, Take 2 capsules by mouth daily. , Disp: , Rfl:    ezetimibe  (ZETIA ) 10 MG tablet, TAKE ONE TABLET BY MOUTH EVERY DAY, Disp: 90 tablet, Rfl: 3   hyoscyamine  (ANASPAZ ) 0.125 MG TBDP disintergrating tablet, Place 1 tablet (0.125 mg total) under the tongue every 6 (six) hours as needed (as needed for esophageal spasm)., Disp: 30 tablet, Rfl: 0   losartan  (COZAAR ) 50 MG tablet, TAKE ONE TABLET BY MOUTH AT BEDTIME, Disp: 30 tablet, Rfl: 0   metoprolol  succinate (TOPROL -XL) 25 MG 24 hr tablet, TAKE ONE TABLET BY MOUTH EVERY DAY, Disp: 90 tablet, Rfl: 3   omeprazole  (PRILOSEC) 20 MG capsule, TAKE 1 CAPSULE BY MOUTH ONCE DAILY, Disp: 90 capsule, Rfl: 3   Probiotic Product (ALIGN PO), Take 1 tablet by mouth daily. , Disp: , Rfl:   Vitals   Vitals:   05-17-24 1018 May 17, 2024 1400 2024/05/17 1815 2024/05/17 1830  BP: 139/74 137/85 117/64 103/64  Pulse: 74 63 64 (!) 59  Resp: 18 14 20 18   Temp: 98.3 F (36.8  C) 98.2 F (36.8 C)    TempSrc: Oral Oral    SpO2: 99% 97% 98% 96%  Weight:      Height:        Body mass index is 22.07 kg/m.   Physical Exam   Constitutional: Appears frail, with significant scoliosis Psych: Affect appropriate to situation, calm and cooperative Eyes: No scleral injection HENT: No oropharyngeal obstruction.  MSK: Scoliosis and arthritic changes, particularly affecting range of motion of the left shoulder Cardiovascular: Normal rate and regular rhythm.  Perfusing extremities well Respiratory: Effort normal, non-labored breathing GI: Soft.  No distension. There is no tenderness.  Skin: Warm dry and intact visible skin.  Chronic changes of the bilateral legs  Neurologic Examination   Mental Status: Patient is awake, alert, oriented to person, place, month, year, and situation. Patient is able to give a clear and coherent history. No signs of aphasia or neglect Cranial Nerves: II: Visual Fields are full. Pupils are equal, round, and reactive to light.   III,IV, VI: EOMI with bilateral age-related ptosis, reports essentially no residual diploplia.  V: Facial sensation is symmetric to light touch VII: Facial movement is symmetric.  VIII: hearing is intact to voice X: Uvula elevates symmetrically XI: Shoulder shrug is symmetric. XII: tongue is midline without atrophy or fasciculations.  Motor: Tone is normal. Bulk is normal. 5/5 strength was present in all four extremities (adjusted for age and frailty), except pain limited bilateral deltoid left greater than right.  Mild bilateral hip flexor weakness 4+/5 Sensory: Sensation is symmetric to light touch and temperature in the arms and legs. Deep Tendon Reflexes: 3+ and symmetric in the brachioradialis and patellae.  Cerebellar: FNF and HKS are intact bilaterally Gait:  Deferred in acute setting    Labs/Imaging/Neurodiagnostic studies   CBC:  Recent Labs  Lab 05-17-2024 0025 05-17-24 0423  WBC 16.9*  15.9*  NEUTROABS 14.3*  --   HGB 11.4* 10.6*  HCT 34.9* 32.9*  MCV 98.3 101.2*  PLT 143* 139*   Basic  Metabolic Panel:  Lab Results  Component Value Date   NA 140 04/18/2024   K 4.0 04/18/2024   CO2 24 04/18/2024   GLUCOSE 103 (H) 04/18/2024   BUN 32 (H) 04/18/2024   CREATININE 1.00 04/18/2024   CALCIUM 8.5 (L) 04/18/2024   GFRNONAA 54 (L) 04/18/2024   GFRAA >60 06/28/2020   Lipid Panel:  Lab Results  Component Value Date   CHOL 133 04/18/2024   HDL 47 04/18/2024   LDLCALC 65 04/18/2024   TRIG 104 04/18/2024   CHOLHDL 2.8 04/18/2024   HgbA1c:  Lab Results  Component Value Date   HGBA1C 4.8 04/18/2024   INR  Lab Results  Component Value Date   INR 1.2 08/30/2022   APTT  Lab Results  Component Value Date   APTT 30 08/30/2022    CT Head without contrast and C-spine (Personally reviewed): 1. No acute intracranial abnormality. Atrophy with small vessel ischemic changes. 2. No acute traumatic injury to the cervical spine. Chronic type 2 dens fracture.  CT angio Head and Neck with contrast(Personally reviewed): 1. No large vessel occlusion, hemodynamically significant stenosis, or aneurysm in the head or neck. 2. Mild atherosclerotic calcifications in the distal aortic arch and descending thoracic aorta without aneurysm or dissection. 3. Mild atherosclerotic changes at the carotid bifurcations and cavernous internal carotid arteries bilaterally without significant stenosis. On my personal review --focal right vert dissection, see key image below.  Discussed with Dr. Hugh of neuroradiology this morning who agrees, radiology report will be addended    MRI Brain(Personally reviewed): 1. 2 separate acute  right cerebellar infarcts. No acute hemorrhage. 2. Mildly advanced periventricular subcortical T2 hyperintensities for age. This might reflect the sequelae of chronic microvascular ischemia.  ECHO:   1. Left ventricular ejection fraction, by estimation,  is 50 to 55%. The  left ventricle has low normal function. The left ventricle has no regional  wall motion abnormalities. Left ventricular diastolic parameters are  consistent with Grade I diastolic  dysfunction (impaired relaxation).   2. Right ventricular systolic function is normal. The right ventricular  size is normal.   3. The mitral valve is normal in structure. Moderate mitral valve  regurgitation. No evidence of mitral stenosis.   4. Tricuspid valve regurgitation is mild to moderate.   5. The aortic valve is normal in structure. Aortic valve regurgitation is  mild. No aortic stenosis is present.   6. The inferior vena cava is normal in size with greater than 50%  respiratory variability, suggesting right atrial pressure of 3 mmHg.   7. Agitated saline contrast bubble study was negative, with no evidence  of any interatrial shunt.  [Normal biatrial sizes]  ASSESSMENT   Brenda Cardenas is a 89 y.o. female presenting with acute cerebellar stroke after fall.  From her description it does sound that the fall was mechanical and in concern there is a slight vessel irregularity in the right vertebral artery, consistent with focal dissection as the etiology of her stroke  RECOMMENDATIONS   # Right cerebellar embolic appearing stroke, secondary to dissection likely secondary to her fall - LDL and A1c meeting goal - Intolerant of statins due to LFT elevations - Frequent neuro checks - Aspirin  81 mg daily (patient denied allergy to this medication, on review of allergies she has had gastritis with NSAIDs before, no significant allergy) - Plavix  300 mg load with 75 mg daily for at least 90 day course (give 225 mg one-time dose to complete 300 mg load) -  Outpatient neurology follow-up for repeat CTA head and neck in approximately 3 months to decide on duration of DAPT - Risk factor modification - Telemetry monitoring;  - Blood pressure goal   - Permissive hypertension to 220/120 due to  acute stroke for 24-48 hours, given she is out of this window okay to start normalizing at this time - PT consult, OT consult, Speech consult - Discussed with primary team via secure chat  ______________________________________________________________________   Lola Jernigan MD-PhD [facility] [phone] [facility] coverage for [facility] is from 8 AM to 4 AM in-house and 4 PM to 8 PM by telephone/video. 8 PM to 8 AM emergent questions or overnight urgent questions should be addressed to Teleneurology On-call or [facility] neurohospitalist; contact information can be found on AMION

## 2024-04-18 NOTE — ED Notes (Signed)
 Blankets provided to pt by tech

## 2024-04-18 NOTE — Assessment & Plan Note (Signed)
-  This is likely prerenal due to mild dehydration. - The patient will be hydrated with IV normal saline and will follow BMP.

## 2024-04-18 NOTE — ED Notes (Signed)
 Dinner meal given. Pt asleep at this time and advised she would eat when she wakes up.

## 2024-04-18 NOTE — Progress Notes (Signed)
 PHARMACIST - PHYSICIAN COMMUNICATION  CONCERNING:  Enoxaparin  (Lovenox ) for DVT Prophylaxis    RECOMMENDATION: Patient was prescribed enoxaprin 40mg  q24 hours for VTE prophylaxis.   Filed Weights   04/18/24 0012  Weight: 51.3 kg (113 lb)    Body mass index is 22.07 kg/m.  Estimated Creatinine Clearance: 23 mL/min (A) (by C-G formula based on SCr of 1.17 mg/dL (H)).  Patient is candidate for enoxaparin  30mg  every 24 hours based on CrCl <63ml/min or Weight <45kg  DESCRIPTION: Pharmacy has adjusted enoxaparin  dose per [facility] policy.  Patient is now receiving enoxaparin  30 mg every 24 hours   [place] CANDIE Dills, PharmD, [facility] 04/18/2024 3:33 AM

## 2024-04-18 NOTE — ED Notes (Signed)
 Pt given dinner meal. Pt not happy with he meal given which was a piece of chicken and a piece of cake. SLP in with pt earlier today and pt complained to same that the food here is horrible and she doesn't like any of it. Pt states that she eats [employer] at home and wishes to have same here. Pt given Ensure supplement in place of meals not being eaten.

## 2024-04-18 NOTE — ED Notes (Signed)
 PT ambulated with a walker and assistance to the bathroom and back

## 2024-04-18 NOTE — ED Notes (Signed)
Pt getting ECHO at this time.

## 2024-04-19 DIAGNOSIS — H532 Diplopia: Secondary | ICD-10-CM | POA: Diagnosis not present

## 2024-04-19 LAB — MRSA NEXT GEN BY PCR, NASAL: MRSA by PCR Next Gen: NOT DETECTED

## 2024-04-19 LAB — URINE CULTURE: Culture: <10000 — AB

## 2024-04-19 MED ORDER — CLOPIDOGREL BISULFATE 75 MG PO TABS: 75.0000 mg | ORAL_TABLET | Freq: Every day | ORAL | 0 refills | Status: DC

## 2024-04-19 MED ORDER — CLOPIDOGREL BISULFATE 75 MG PO TABS
225.0000 mg | ORAL_TABLET | Freq: Once | ORAL | Status: AC
  Administered 2024-04-19: 225 mg via ORAL
  Filled 2024-04-19: qty 3

## 2024-04-19 MED ORDER — ASPIRIN 81 MG PO TBEC: 81.0000 mg | DELAYED_RELEASE_TABLET | Freq: Every day | ORAL | 12 refills | Status: AC

## 2024-04-19 NOTE — Discharge Summary (Signed)
 Physician Discharge Summary   Patient: Brenda Cardenas MRN: [id number] DOB: August 09, 1932  Admit date:     04/18/2024  Discharge date: 04/19/24  Discharge Physician: Carliss LELON Canales   PCP: Marylynn Verneita CROME, MD   Recommendations at discharge:    Pt to be discharged home with home health.   If you experience worsening fever, chills, chest pain, shortness of breath, or other concerning symptoms, please call your PCP or go to the emergency department immediately.  Discharge Diagnoses: Principal Problem:   Diplopia Active Problems:   AKI (acute kidney injury) (HCC)   Acute lower UTI   Dyslipidemia   Essential hypertension   GERD without esophagitis  Resolved Problems:   * No resolved hospital problems. [facility]:  89 y.o. Caucasian female with medical history significant for osteoarthritis, GERD, hypertension, dyslipidemia, hiatal hernia, IBS who presented to the emergency room with acute onset lightheadedness with dizziness and a fall.  The patient was bending over in the bathroom to pick up a dead bug when she fell.  She stated that she fell forward hitting her head.  She was able to get up on her own and ambulate with her walker.  Around 6 PM she started having double vision with both of her eyes open with mild headache.  She denies any dysarthria or dysphagia.  No paresthesias or focal muscle weakness.  She admits to urinary frequency and urgency with dysuria.  No hematuria or flank pain.  No chest pain or palpitations.  No cough or wheezing or dyspnea.   Assessment and Plan:  Acute right cerebellar CVA - Noted on MRI.  Likely etiology of patient's diplopia and dizziness.  Symptoms largely resolved at this time.  Initiated on DAPT.  Patient has intolerance to statins causing LFT elevations.  Referral to neurology provided upon discharge.  Right vertebral artery dissection - Evaluated by neurology.  Recommending DAPT for at least 90 days.  Patient to follow-up with neurology in  the outpatient setting to repeat imaging and reevaluate need for continued DAPT.  Acute kidney injury - Likely prerenal etiology.  Resolved after IV fluid hydration.  Possible urinary tract infection - Currently has no symptoms.  Likely asymptomatic bacteriuria. Continues on prophylactic Keflex .  Received IV ceftriaxone  while inpatient.  Will discontinue upon discharge.  Hypertension/hyperlipidemia/GERD - Continue home regimen.  Physical debilitation muscle weakness - Evaluated by PT.  Recommending home health.  Consultants: Neurology Procedures performed: None Disposition: Home health Diet recommendation:  Discharge Diet Orders (From admission, onward)     Start     Ordered   04/19/24 0000  Diet - low sodium heart healthy        04/19/24 1109           Cardiac diet  DISCHARGE MEDICATION: Allergies as of 04/19/2024       Reactions   Ciprofloxacin  Diarrhea   Clindamycin/lincomycin Diarrhea   Sulfa Antibiotics Rash   Rash in throat   Augmentin [amoxicillin-pot Clavulanate] Diarrhea   Gabapentin  Other (See Comments)   Irregular heartbeat   Gemtesa [vibegron] Diarrhea   Lidocaine     Other reaction(s): Not available   Naproxen  Sodium Other (See Comments)   Causes gastritis   Prednisone     Atrial Fibrillation   Tramadol Diarrhea   upset stomach, headache, dizziness, drowsiness   Codeine Nausea Only, Rash   Upset stomach   Doxycycline Hyclate Nausea And Vomiting, Nausea Only   Dry heaves.   E-mycin [erythromycin] Rash   Macrobid [nitrofurantoin] Rash  severe   Nabumetone Rash, Other (See Comments)   Upsets liver enzymes   Nitrofurantoin Monohyd Macro Rash   Nsaids Rash   Gastritis, GI ulceration.   Phenobarbital Other (See Comments), Rash, Anxiety   got wild Patient becomes very hyper and anxious   Statins Rash   Upsets liver enzymes        Medication List     TAKE these medications    acetaminophen  500 MG tablet Commonly known as:  TYLENOL  Take 1,000 mg by mouth 2 (two) times daily.   ALIGN PO Take 1 tablet by mouth daily.   amiodarone  200 MG tablet Commonly known as: PACERONE  TAKE ONE TABLET BY MOUTH EVERY DAY   aspirin  EC 81 MG tablet Take 1 tablet (81 mg total) by mouth daily. Swallow whole.   cephALEXin  250 MG capsule Commonly known as: KEFLEX  Take 250 mg by mouth 2 (two) times daily.   clopidogrel  75 MG tablet Commonly known as: PLAVIX  Take 1 tablet (75 mg total) by mouth daily. Start taking on: April 20, 2024   CRANBERRY PO Take 2 capsules by mouth daily.   ezetimibe  10 MG tablet Commonly known as: ZETIA  TAKE ONE TABLET BY MOUTH EVERY DAY   hyoscyamine  0.125 MG Tbdp disintergrating tablet Commonly known as: ANASPAZ  Place 1 tablet (0.125 mg total) under the tongue every 6 (six) hours as needed (as needed for esophageal spasm).   losartan  50 MG tablet Commonly known as: COZAAR  TAKE ONE TABLET BY MOUTH AT BEDTIME   metoprolol  succinate 25 MG 24 hr tablet Commonly known as: TOPROL -XL TAKE ONE TABLET BY MOUTH EVERY DAY   omeprazole  20 MG capsule Commonly known as: PRILOSEC TAKE 1 CAPSULE BY MOUTH ONCE DAILY   Vitamin D  50 MCG (2000 UT) Caps Take 1 capsule by mouth every other day.         Discharge Exam: Filed Weights   04/18/24 0012 04/18/24 2227 04/19/24 0500  Weight: 51.3 kg 52.2 kg 52.5 kg    GENERAL:  Alert, pleasant, no acute distress  HEENT:  EOMI CARDIOVASCULAR:  RRR, no murmurs appreciated RESPIRATORY:  Clear to auscultation, no wheezing, rales, or rhonchi GASTROINTESTINAL:  Soft, nontender, nondistended EXTREMITIES:  No LE edema bilaterally NEURO:  No new focal deficits appreciated SKIN:  No rashes noted PSYCH:  Appropriate mood and affect     Condition at discharge: improving  The results of significant diagnostics from this hospitalization (including imaging, microbiology, ancillary and laboratory) are listed below for reference.   Imaging  Studies: ECHOCARDIOGRAM COMPLETE BUBBLE STUDY Result Date: 04/18/2024    ECHOCARDIOGRAM REPORT   Patient Name:   Brenda Cardenas Date of Exam: 04/18/2024 Medical Rec #:  [id number]     Height:       60.0 in Accession #:    [id number]    Weight:       113.0 lb Date of Birth:  27-Jul-1932     BSA:          1.464 m Patient Age:    89 years      BP:           157/71 mmHg Patient Gender: F             HR:           59 bpm. Exam Location:  [facility] Procedure: 2D Echo, Cardiac Doppler, Color Doppler and Saline Contrast Bubble            Study (Both Spectral and Color Flow Doppler were utilized  during            procedure). Indications:     Stroke 434.91 / I63.9  History:         Patient has prior history of Echocardiogram examinations, most                  recent 05/13/2022. Stroke; Signs/Symptoms:Syncope.  Sonographer:     Ashley McNeely-Sloane Referring Phys:  [id number] JAN A MANSY Diagnosing Phys: Marsa Dooms MD IMPRESSIONS  1. Left ventricular ejection fraction, by estimation, is 50 to 55%. The left ventricle has low normal function. The left ventricle has no regional wall motion abnormalities. Left ventricular diastolic parameters are consistent with Grade I diastolic dysfunction (impaired relaxation).  2. Right ventricular systolic function is normal. The right ventricular size is normal.  3. The mitral valve is normal in structure. Moderate mitral valve regurgitation. No evidence of mitral stenosis.  4. Tricuspid valve regurgitation is mild to moderate.  5. The aortic valve is normal in structure. Aortic valve regurgitation is mild. No aortic stenosis is present.  6. The inferior vena cava is normal in size with greater than 50% respiratory variability, suggesting right atrial pressure of 3 mmHg.  7. Agitated saline contrast bubble study was negative, with no evidence of any interatrial shunt. FINDINGS  Left Ventricle: Left ventricular ejection fraction, by estimation, is 50 to 55%. The left ventricle has low normal  function. The left ventricle has no regional wall motion abnormalities. Strain was performed and the global longitudinal strain is indeterminate. The left ventricular internal cavity size was normal in size. There is no left ventricular hypertrophy. Left ventricular diastolic parameters are consistent with Grade I diastolic dysfunction (impaired relaxation). Right Ventricle: The right ventricular size is normal. No increase in right ventricular wall thickness. Right ventricular systolic function is normal. Left Atrium: Left atrial size was normal in size. Right Atrium: Right atrial size was normal in size. Pericardium: There is no evidence of pericardial effusion. Mitral Valve: The mitral valve is normal in structure. Moderate mitral valve regurgitation. No evidence of mitral valve stenosis. MV peak gradient, 5.2 mmHg. The mean mitral valve gradient is 1.0 mmHg. Tricuspid Valve: The tricuspid valve is normal in structure. Tricuspid valve regurgitation is mild to moderate. No evidence of tricuspid stenosis. Aortic Valve: The aortic valve is normal in structure. Aortic valve regurgitation is mild. No aortic stenosis is present. Aortic valve mean gradient measures 4.0 mmHg. Aortic valve peak gradient measures 6.9 mmHg. Aortic valve area, by VTI measures 2.48 cm. Pulmonic Valve: The pulmonic valve was normal in structure. Pulmonic valve regurgitation is not visualized. No evidence of pulmonic stenosis. Aorta: The aortic root is normal in size and structure. Venous: The inferior vena cava is normal in size with greater than 50% respiratory variability, suggesting right atrial pressure of 3 mmHg. IAS/Shunts: No atrial level shunt detected by color flow Doppler. Agitated saline contrast was given intravenously to evaluate for intracardiac shunting. Agitated saline contrast bubble study was negative, with no evidence of any interatrial shunt. Additional Comments: 3D was performed not requiring image post processing on an  independent workstation and was indeterminate.  LEFT VENTRICLE PLAX 2D LVIDd:         3.90 cm     Diastology LVIDs:         3.50 cm     LV e' medial:    5.44 cm/s LV PW:         1.10 cm     LV E/e'  medial:  10.9 LV IVS:        0.80 cm     LV e' lateral:   6.20 cm/s LVOT diam:     2.00 cm     LV E/e' lateral: 9.5 LV SV:         78 LV SV Index:   53 LVOT Area:     3.14 cm  LV Volumes (MOD) LV vol d, MOD A2C: 60.3 ml LV vol d, MOD A4C: 76.8 ml LV vol s, MOD A2C: 14.4 ml LV vol s, MOD A4C: 30.7 ml LV SV MOD A2C:     45.9 ml LV SV MOD A4C:     76.8 ml LV SV MOD BP:      49.8 ml RIGHT VENTRICLE             IVC RV Basal diam:  5.10 cm     IVC diam: 0.75 cm RV Mid diam:    3.80 cm RV S prime:     16.40 cm/s TAPSE (M-mode): 2.6 cm LEFT ATRIUM             Index        RIGHT ATRIUM           Index LA Vol (A2C):   26.5 ml 18.10 ml/m  RA Area:     24.20 cm LA Vol (A4C):   22.4 ml 15.30 ml/m  RA Volume:   81.40 ml  55.58 ml/m LA Biplane Vol: 24.6 ml 16.80 ml/m  AORTIC VALVE                    PULMONIC VALVE AV Area (Vmax):    2.66 cm     PV Vmax:        0.88 m/s AV Area (Vmean):   2.38 cm     PV Vmean:       60.700 cm/s AV Area (VTI):     2.48 cm     PV VTI:         0.228 m AV Vmax:           131.00 cm/s  PV Peak grad:   3.1 mmHg AV Vmean:          87.100 cm/s  PV Mean grad:   2.0 mmHg AV VTI:            0.314 m      RVOT Peak grad: 1 mmHg AV Peak Grad:      6.9 mmHg AV Mean Grad:      4.0 mmHg LVOT Vmax:         111.00 cm/s LVOT Vmean:        66.100 cm/s LVOT VTI:          0.248 m LVOT/AV VTI ratio: 0.79  AORTA Ao Root diam: 3.20 cm Ao Asc diam:  2.80 cm MITRAL VALVE               TRICUSPID VALVE MV Area (PHT): 3.37 cm    TR Peak grad:   23.8 mmHg MV Area VTI:   2.87 cm    TR Mean grad:   18.0 mmHg MV Peak grad:  5.2 mmHg    TR Vmax:        244.00 cm/s MV Mean grad:  1.0 mmHg    TR Vmean:       206.0 cm/s MV Vmax:       1.14 m/s MV Vmean:      54.7 cm/s  SHUNTS MV Decel Time: 225 msec    Systemic VTI:  0.25 m MR Peak  grad: 17.6 mmHg    Systemic Diam: 2.00 cm MR Vmax:      210.00 cm/s  Pulmonic VTI:  0.140 m MV E velocity: 59.20 cm/s MV A velocity: 81.80 cm/s MV E/A ratio:  0.72 Marsa Dooms MD Electronically signed by Marsa Dooms MD Signature Date/Time: 04/18/2024/1:32:55 PM    Final    MR BRAIN WO CONTRAST Result Date: 04/18/2024 EXAM: MRI BRAIN WITHOUT CONTRAST 04/18/2024 05:19:00 AM TECHNIQUE: Multiplanar multisequence MRI of the head/brain was performed without the administration of intravenous contrast. COMPARISON: CT head without contrast and CT angio head and neck 04/18/2024. CLINICAL HISTORY: 89 y.o. female presents to the emergency department with double vision and mild headache approximately 6 hours after a fall. No slurring of speech or extremity numbness or weakness. Neuro deficit, acute, stroke suspected. FINDINGS: BRAIN AND VENTRICLES: 2 separate foci of restricted diffusion are present in the right cerebellum associated with T2 and FLAIR hyperintensities. No acute hemorrhage. No mass. No midline shift. No hydrocephalus. The sella is unremarkable. Normal flow voids. Generalized atrophy is within normal limits for age. Periventricular subcortical T2 hyperintensities are mildly advanced for age. ORBITS: Bilateral lens replacements are noted. The globes and orbits are otherwise within normal limits. No acute abnormality. SINUSES AND MASTOIDS: No acute abnormality. BONES AND SOFT TISSUES: Normal marrow signal. No acute soft tissue abnormality. IMPRESSION: 1. 2 separate acute  right cerebellar infarcts. No acute hemorrhage. 2. Mildly advanced periventricular subcortical T2 hyperintensities for age. This might reflect the sequelae of chronic microvascular ischemia. Electronically signed by: Lonni Necessary MD 04/18/2024 05:32 AM EDT RP Workstation: [id number]   CT ANGIO HEAD NECK W WO CM Result Date: 04/18/2024 EXAM: CTA HEAD AND NECK WITH AND WITHOUT 04/18/2024 04:39:57 AM TECHNIQUE: CTA of the  head and neck was performed with and without the administration of intravenous contrast. Multiplanar 2D and/or 3D reformatted images are provided for review. Automated exposure control, iterative reconstruction, and/or weight based adjustment of the mA/kV was utilized to reduce the radiation dose to as low as reasonably achievable. Stenosis of the internal carotid arteries measured using NASCET criteria. COMPARISON: CT head without contrast 04/18/2024 CLINICAL HISTORY: Fall and shower. Dizziness. Diplopia. Neuro deficit, acute, stroke suspected. FINDINGS: CTA NECK: AORTIC ARCH AND ARCH VESSELS: Mild atherosclerotic calcifications are present in the distal aortic arch and descending thoracic aorta without aneurysm or dissection. CERVICAL CAROTID ARTERIES: Minimal calcifications are present at the right carotid bifurcation without significant stenosis. Minimal atherosclerotic calcifications are present at the left carotid bifurcation without focal stenosis. CERVICAL VERTEBRAL ARTERIES: The left vertebral artery is the dominant vessel. No significant stenosis is present in either vertebral artery in the neck. LUNGS AND MEDIASTINUM: Mild dependent atelectasis is present in the visualized lungs. SOFT TISSUES: No acute abnormality. BONES: Multilevel degenerative changes are present in the cervical spine. A remote nonunion type 2 dens fracture is stable. Grade 1 degenerative anterolisthesis at C3-4 and C4-5 is stable. No focal osseous lesions are present. CTA HEAD: ANTERIOR CIRCULATION: Atherosclerotic calcifications are present within the cavernous internal carotid arteries bilaterally without significant stenosis through the ICA terminus. The anterior communicating artery is patent. No significant stenosis, aneurysm or large vessel occlusion is present. POSTERIOR CIRCULATION: No significant stenosis of the posterior cerebral arteries. No significant stenosis of the basilar artery. No significant stenosis of the vertebral  arteries. No aneurysm. OTHER: No dural venous sinus thrombosis on this non-dedicated study. IMPRESSION: 1.  No large vessel occlusion, hemodynamically significant stenosis, or aneurysm in the head or neck. 2. Mild atherosclerotic calcifications in the distal aortic arch and descending thoracic aorta without aneurysm or dissection. 3. Mild atherosclerotic changes at the carotid bifurcations and cavernous internal carotid arteries bilaterally without significant stenosis. Electronically signed by: Lonni Necessary MD 04/18/2024 04:54 AM EDT RP Workstation: [id number]   CT Head Wo Contrast Result Date: 04/18/2024 EXAM: CT HEAD AND CERVICAL SPINE 04/18/2024 12:50:26 AM TECHNIQUE: CT of the head and cervical spine was performed without the administration of intravenous contrast. Multiplanar reformatted images are provided for review. Automated exposure control, iterative reconstruction, and/or weight based adjustment of the mA/kV was utilized to reduce the radiation dose to as low as reasonably achievable. COMPARISON: 05/09/2023 CLINICAL HISTORY: Head trauma, minor (Age >= 65y). Patient arrives EMS from [facility]. Patient states at lunch time she was picking up something in her bathroom and fell in the shower. After dinner she started complaining of double vision, neck pain, dizziness. LKW: Right before fall. Fall occurred around 1200. 159/79, 94HR, 93% on room air. FINDINGS: CT HEAD BRAIN AND VENTRICLES: No acute intracranial hemorrhage. No mass effect or midline shift. No abnormal extra-axial fluid collection. Gray-white differentiation is maintained. No hydrocephalus. Global cortical atrophy. Subcortical and periventricular small vessel ischemic changes. Intracranial atherosclerosis. ORBITS: No acute abnormality. SINUSES AND MASTOIDS: No acute abnormality. SOFT TISSUES AND SKULL: No acute skull fracture. No acute soft tissue abnormality. CT CERVICAL SPINE BONES AND ALIGNMENT: Chronic type 2 dense  fracture. No acute fracture. DEGENERATIVE CHANGES: 4 mm anterolisthesis of C4 on C5. Mild degenerative changes of the mid cervical spine. SOFT TISSUES: No prevertebral soft tissue swelling. IMPRESSION: 1. No acute intracranial abnormality. Atrophy with small vessel ischemic changes. 2. No acute traumatic injury to the cervical spine. Chronic type 2 dens fracture. Electronically signed by: Pinkie Pebbles MD 04/18/2024 12:54 AM EDT RP Workstation: [id number]   CT Cervical Spine Wo Contrast Result Date: 04/18/2024 EXAM: CT HEAD AND CERVICAL SPINE 04/18/2024 12:50:26 AM TECHNIQUE: CT of the head and cervical spine was performed without the administration of intravenous contrast. Multiplanar reformatted images are provided for review. Automated exposure control, iterative reconstruction, and/or weight based adjustment of the mA/kV was utilized to reduce the radiation dose to as low as reasonably achievable. COMPARISON: 05/09/2023 CLINICAL HISTORY: Head trauma, minor (Age >= 65y). Patient arrives EMS from [facility]. Patient states at lunch time she was picking up something in her bathroom and fell in the shower. After dinner she started complaining of double vision, neck pain, dizziness. LKW: Right before fall. Fall occurred around 1200. 159/79, 94HR, 93% on room air. FINDINGS: CT HEAD BRAIN AND VENTRICLES: No acute intracranial hemorrhage. No mass effect or midline shift. No abnormal extra-axial fluid collection. Gray-white differentiation is maintained. No hydrocephalus. Global cortical atrophy. Subcortical and periventricular small vessel ischemic changes. Intracranial atherosclerosis. ORBITS: No acute abnormality. SINUSES AND MASTOIDS: No acute abnormality. SOFT TISSUES AND SKULL: No acute skull fracture. No acute soft tissue abnormality. CT CERVICAL SPINE BONES AND ALIGNMENT: Chronic type 2 dense fracture. No acute fracture. DEGENERATIVE CHANGES: 4 mm anterolisthesis of C4 on C5. Mild  degenerative changes of the mid cervical spine. SOFT TISSUES: No prevertebral soft tissue swelling. IMPRESSION: 1. No acute intracranial abnormality. Atrophy with small vessel ischemic changes. 2. No acute traumatic injury to the cervical spine. Chronic type 2 dens fracture. Electronically signed by: Pinkie Pebbles MD 04/18/2024 12:54 AM EDT RP Workstation: [id number]   DG Chest  Portable 1 View Result Date: 04/18/2024 EXAM: 1 VIEW XRAY OF THE CHEST 04/18/2024 12:32:35 AM COMPARISON: 01/29/2023 CLINICAL HISTORY: Fall. Patient arrives EMS from [facility]. Patient states at lunch time she was picking up something in her bathroom and fell in the shower. After dinner she started complaining of double vision, neck pain, dizziness. LKW: Right before fall. Fall occurred around 1200. 159/79, 94HR, 93% on room air. FINDINGS: LUNGS AND PLEURA: Mild left basilar atelectasis. No focal pulmonary opacity. No pulmonary edema. No pleural effusion. No pneumothorax. HEART AND MEDIASTINUM: No acute abnormality of the cardiac and mediastinal silhouettes. Small hiatal hernia. BONES AND SOFT TISSUES: S-shaped thoracolumbar scoliosis. Left shoulder degenerative changes. No acute osseous abnormality. IMPRESSION: 1. No acute process. 2. Mild left basilar atelectasis. Electronically signed by: Pinkie Pebbles MD 04/18/2024 12:40 AM EDT RP Workstation: [id number]    Microbiology: Results for orders placed or performed during the hospital encounter of 04/18/24  Urine Culture     Status: None (Preliminary result)   Collection Time: 04/18/24  2:05 AM   Specimen: Urine, Random  Result Value Ref Range Status   Specimen Description   Final    URINE, RANDOM Performed at [facility], [address], [place], [place] [address]    Special Requests   Final    URINE, CLEAN CATCH Performed at [facility], [address], [place], [place] [address]    Culture PENDING  Incomplete   Report Status PENDING   Incomplete  MRSA Next Gen by PCR, Nasal     Status: None   Collection Time: 04/18/24 10:50 PM   Specimen: Nasal Mucosa; Nasal Swab  Result Value Ref Range Status   MRSA by PCR Next Gen NOT DETECTED NOT DETECTED Final    Comment: (NOTE) The GeneXpert MRSA Assay (FDA approved for NASAL specimens only), is one component of a comprehensive MRSA colonization surveillance program. It is not intended to diagnose MRSA infection nor to guide or monitor treatment for MRSA infections. Test performance is not FDA approved in patients less than 43 years old. Performed at [facility], [address] [place], [place] [address]     Labs: CBC: Recent Labs  Lab 04/18/24 0025 04/18/24 0423  WBC 16.9* 15.9*  NEUTROABS 14.3*  --   HGB 11.4* 10.6*  HCT 34.9* 32.9*  MCV 98.3 101.2*  PLT 143* 139*   Basic Metabolic Panel: Recent Labs  Lab 04/18/24 0025 04/18/24 0423  NA 138 140  K 4.2 4.0  CL 106 108  CO2 21* 24  GLUCOSE 124* 103*  BUN 37* 32*  CREATININE 1.17* 1.00  CALCIUM 9.2 8.5*   Liver Function Tests: Recent Labs  Lab 04/18/24 0025  AST 30  ALT 20  ALKPHOS 65  BILITOT 0.9  PROT 6.2*  ALBUMIN 3.4*   CBG: No results for input(s): GLUCAP in the last 168 hours.  Discharge time spent: 35 minutes.  Length of inpatient stay: 0 days  Signed: Carliss LELON Canales, DO [facility] 04/19/2024

## 2024-04-20 ENCOUNTER — Telehealth: Payer: Self-pay

## 2024-04-20 ENCOUNTER — Other Ambulatory Visit: Payer: Self-pay | Admitting: Internal Medicine

## 2024-04-20 DIAGNOSIS — I10 Essential (primary) hypertension: Secondary | ICD-10-CM

## 2024-04-20 NOTE — Telephone Encounter (Signed)
 Left message to return call to our office.  Need to find out date of surgery?

## 2024-04-20 NOTE — Telephone Encounter (Signed)
 Copied from CRM #[id number]. Topic: Clinical - Home Health Verbal Orders >> Apr 20, 2024 11:59 AM Suzen RAMAN wrote: Caller/Agency: Lavella Eric Callback Number: [phone] Service Requested: Physical Therapy evaluation and treatment Frequency: unable to provide frequency at this time Any new concerns about the patient? Yes, recent d/c from hospital 04/19/24

## 2024-04-20 NOTE — Telephone Encounter (Unsigned)
 Copied from CRM [id number]. Topic: Clinical - Medical Advice >> Apr 19, 2024  5:04 PM Deleta RAMAN wrote: Reason for CRM: patient needs authorization in order to get operation/ implant done on mouth. She will need authorization from pcp or er doctor before getting this done and will like for the office to give her a call regarding medical advice and information to go forward. Please give the patient a call at [phone]

## 2024-04-20 NOTE — Telephone Encounter (Signed)
 Per Delon the social worker with [facility] stated that she will call pt and advised pt to call us  back and schedule an appt.

## 2024-04-20 NOTE — Transitions of Care (Post Inpatient/ED Visit) (Signed)
   04/20/2024  Name: Brenda Cardenas MRN: [id number] DOB: 22-Jul-1932  Today's TOC FU Call Status: Today's TOC FU Call Status:: Unsuccessful Call (1st Attempt) Unsuccessful Call (1st Attempt) Date: 04/20/24  Attempted to reach the patient regarding the most recent Inpatient/ED visit.  Follow Up Plan: Additional outreach attempts will be made to reach the patient to complete the Transitions of Care (Post Inpatient/ED visit) call.   Signature Julian Lemmings, LPN [facility] Nurse Health Advisor Direct Dial [phone]

## 2024-04-20 NOTE — Telephone Encounter (Signed)
 Copied from CRM #[id number]. Topic: Appointments - Scheduling Inquiry for Clinic >> Apr 20, 2024  3:01 PM Brenda Cardenas wrote: Reason for CRM: Patient called in regarding scheduling a hospital followup with Dr.Tullo sooner than the 20th, would like for someone to give her a callback if she can be seen sooner  I spoke with patient and offered to schedule an appointment with Dr. Nischal Cardenas for her hospital follow-up so she can be seen sooner.  Patient states she only wants to see Dr. [name].  I scheduled patient for Dr. Lula next available appointment on 05/12/2024.

## 2024-04-20 NOTE — Telephone Encounter (Signed)
 Copied from CRM [id number]. Topic: Clinical - Medical Advice >> Apr 19, 2024  5:04 PM Brenda Cardenas wrote: Reason for CRM: patient needs authorization in order to get operation/ implant done on mouth. She will need authorization from pcp or er doctor before getting this done and will like for the office to give her a call regarding medical advice and information to go forward. Please give the patient a call at [phone]

## 2024-04-20 NOTE — Telephone Encounter (Signed)
 noted

## 2024-04-20 NOTE — Telephone Encounter (Signed)
 Copied from CRM [id number]. Topic: Clinical - Medication Question >> Apr 20, 2024  8:18 AM Montie POUR wrote: Reason for CRM:  Ms. Eugene did not want to come in for a emergency room follow up. She wants nurse Harlene to call her about her medication that she was given at the emergency room. She is confused about her medication. She is not having her surgery for now.  Her number is [phone]

## 2024-04-20 NOTE — Telephone Encounter (Signed)
 LMTCB

## 2024-04-20 NOTE — Telephone Encounter (Signed)
 Spoke with Delon the social worker at [facility] to give the order for PT eval and treat. Delon stated that she would need for us  a fax over an order so I have typed up the order for Dr Marylynn to sign and placed in quick sign folder. Delon also let us  know that pt was discharged from hospital with home health orders but a referral was never submitted. I advised Delon that pt would need to seen for a hospital follow up in order for Dr. Marylynn to order home health. Delon stated that she will be calling pt this afternoon to let her know that she needs to call our office to schedule an appt. She also stated that pt will be receiving [facility].   Physicians order has been placed in quick sign folder for signature.

## 2024-04-21 NOTE — Transitions of Care (Post Inpatient/ED Visit) (Signed)
   04/21/2024  Name: Brenda Cardenas MRN: [id number] DOB: 1932/01/16  Today's TOC FU Call Status: Today's TOC FU Call Status:: Unsuccessful Call (2nd Attempt) Unsuccessful Call (1st Attempt) Date: 04/20/24 Unsuccessful Call (2nd Attempt) Date: 04/21/24  Attempted to reach the patient regarding the most recent Inpatient/ED visit.  Follow Up Plan: Additional outreach attempts will be made to reach the patient to complete the Transitions of Care (Post Inpatient/ED visit) call.   Signature Julian Lemmings, LPN [facility] Nurse Health Advisor Direct Dial [phone]

## 2024-04-21 NOTE — Telephone Encounter (Signed)
 Copied from CRM [id number]. Topic: Appointments - Scheduling Inquiry for Clinic >> Apr 21, 2024  8:19 AM Suzen RAMAN wrote: Reason for CRM: Patient contacted office stating she will not be able to make her 8:00 am lab appointment per Dr.Tullo. Advised patient that no notation of appointment was located in the system but patient was adamant that she had an appointment and wanted to make provider and lab aware that she would not be able to make it because she has been in the hospital for 3 days and just not feeling up to it.

## 2024-04-24 NOTE — Telephone Encounter (Signed)
 Pt was not scheduled for a lab appt

## 2024-04-25 NOTE — Telephone Encounter (Unsigned)
 Copied from CRM [id number]. Topic: General - Call Back - No Documentation >> Apr 25, 2024  1:50 PM Henretta I wrote: Reason for CRM: Patient had missed a call today from Mrs. Lanette in TOC, tried to contact her for patient but was not able to get patient over. Patient would still like a call from either [facility] or Dr. Marylynn.

## 2024-04-25 NOTE — Telephone Encounter (Signed)
 Spoke with pt and she just wanted to make sure that Dr. Marylynn knew she had been in the hospital for a stroke. She also wanted to make sure that Dr. Marylynn would clear her for a dental procedure that she is supposed to have on 05/19/2024. I explained to pt that dr. Marylynn would address that with her during her visit on 05/12/2024.

## 2024-04-25 NOTE — Telephone Encounter (Unsigned)
 Copied from CRM [id number]. Topic: General - Call Back - No Documentation >> Apr 25, 2024  1:32 PM Franky GRADE wrote: Reason for CRM: Patient is is returning a call she states she received from Dr.Tullo, no documentation on file. Patient is sorry for missing the call but really wants to speak with either Dr.Tullo or Jessica.

## 2024-04-25 NOTE — Transitions of Care (Post Inpatient/ED Visit) (Signed)
   04/25/2024  Name: Brenda Cardenas MRN: [id number] DOB: 07/03/1932  Today's TOC FU Call Status: Today's TOC FU Call Status:: Unsuccessful Call (2nd Attempt) Unsuccessful Call (1st Attempt) Date: 04/20/24 Unsuccessful Call (2nd Attempt) Date: 04/21/24  Attempted to reach the patient regarding the most recent Inpatient/ED visit.  Follow Up Plan: No further outreach attempts will be made at this time. We have been unable to contact the patient.  Signature Julian Lemmings, LPN [facility] Nurse Health Advisor Direct Dial [phone]

## 2024-04-25 NOTE — Telephone Encounter (Signed)
Noted see previous encounter

## 2024-04-27 DIAGNOSIS — H532 Diplopia: Secondary | ICD-10-CM | POA: Diagnosis not present

## 2024-04-27 DIAGNOSIS — Z0100 Encounter for examination of eyes and vision without abnormal findings: Secondary | ICD-10-CM | POA: Diagnosis not present

## 2024-05-01 DIAGNOSIS — R2689 Other abnormalities of gait and mobility: Secondary | ICD-10-CM | POA: Diagnosis not present

## 2024-05-01 DIAGNOSIS — R296 Repeated falls: Secondary | ICD-10-CM | POA: Diagnosis not present

## 2024-05-01 DIAGNOSIS — R278 Other lack of coordination: Secondary | ICD-10-CM | POA: Diagnosis not present

## 2024-05-01 DIAGNOSIS — M6281 Muscle weakness (generalized): Secondary | ICD-10-CM | POA: Diagnosis not present

## 2024-05-03 ENCOUNTER — Encounter: Payer: Self-pay | Admitting: Internal Medicine

## 2024-05-03 ENCOUNTER — Telehealth: Payer: Self-pay

## 2024-05-03 ENCOUNTER — Ambulatory Visit: Admitting: Internal Medicine

## 2024-05-03 VITALS — BP 122/68 | HR 69 | Ht 60.0 in | Wt 107.0 lb

## 2024-05-03 DIAGNOSIS — H532 Diplopia: Secondary | ICD-10-CM

## 2024-05-03 DIAGNOSIS — D631 Anemia in chronic kidney disease: Secondary | ICD-10-CM | POA: Diagnosis not present

## 2024-05-03 DIAGNOSIS — I7774 Dissection of vertebral artery: Secondary | ICD-10-CM | POA: Diagnosis not present

## 2024-05-03 DIAGNOSIS — Z8673 Personal history of transient ischemic attack (TIA), and cerebral infarction without residual deficits: Secondary | ICD-10-CM

## 2024-05-03 DIAGNOSIS — Z8669 Personal history of other diseases of the nervous system and sense organs: Secondary | ICD-10-CM

## 2024-05-03 DIAGNOSIS — L292 Pruritus vulvae: Secondary | ICD-10-CM | POA: Diagnosis not present

## 2024-05-03 DIAGNOSIS — I10 Essential (primary) hypertension: Secondary | ICD-10-CM

## 2024-05-03 DIAGNOSIS — L430 Hypertrophic lichen planus: Secondary | ICD-10-CM | POA: Diagnosis not present

## 2024-05-03 DIAGNOSIS — N9489 Other specified conditions associated with female genital organs and menstrual cycle: Secondary | ICD-10-CM | POA: Diagnosis not present

## 2024-05-03 DIAGNOSIS — Z09 Encounter for follow-up examination after completed treatment for conditions other than malignant neoplasm: Secondary | ICD-10-CM

## 2024-05-03 DIAGNOSIS — N179 Acute kidney failure, unspecified: Secondary | ICD-10-CM | POA: Diagnosis not present

## 2024-05-03 DIAGNOSIS — N1831 Chronic kidney disease, stage 3a: Secondary | ICD-10-CM

## 2024-05-03 DIAGNOSIS — R102 Pelvic and perineal pain: Secondary | ICD-10-CM | POA: Diagnosis not present

## 2024-05-03 MED ORDER — LOSARTAN POTASSIUM 50 MG PO TABS: 50.0000 mg | ORAL_TABLET | Freq: Every day | ORAL | 5 refills | Status: AC

## 2024-05-03 NOTE — Progress Notes (Unsigned)
 Subjective:  Patient ID: Brenda Cardenas, female    DOB: [birth year]  Age: 89 y.o. MRN: [id number]  CC: The primary encounter diagnosis was AKI (acute kidney injury) (HCC). Diagnoses of Essential hypertension, Anemia due to stage 3a chronic kidney disease (HCC), Dissection of intracranial vertebral artery (HCC), History of CVA (cerebrovascular accident) without residual deficits, Hospital discharge follow-up, Diplopia, and Hypertrophic lichen planus of vulva were also pertinent to this visit.   HPI Brenda Cardenas presents for  Chief Complaint  Patient presents with  . Hospitalization Follow-up    Brenda Cardenas is a delightful 89 yr old female with vascular dementia, hypertension, spinal stenosis with left sided sciatica,  right knee pain secondary to OA s/p remote TKR,  history of nondisplaced low dens fracture in  August 2024 secondary to mechanical fall, who was admitted to [facility] on July 22 after a fall at home with neck injury resulted in  diplopia  of right eye that started several hours after the fall. She was  diagnosed with Right cerebellar embolic appearing stroke, secondary to vertebral artery dissection  which was attributed to her  neck injury   ECHO was normal.  Neurologic deficits resolved in house.  She received a loading dose of  plavix  and advised to advised to continue daily plavix   and aspirin  for a minimum of 90 days.   Vision in right eye returned  to normal during stay in hospital  .  She had no deficits and was discharged on July 23  to independent living at [facility]. A referral was made to outpatient neurology for  follow-up and a  repeat CTA head and neck was advised  in approximately 3 months to decide on duration of DAPT .However, she is requesting a change in venue as she does not drive and has no immediate family to drive her to [facility]   Since discharge she has had home health assistance:  CMAs have been coming out twice  daily to help her don her compression stockings,  supervise  her medications and take her grocery  shopping.  Still having neck pain ; using tylenol  twice daily.  Physical therapy is  starting next week 2/week  at the [facility].   Patient saw gynecology today for recurrent lichen panus and has been referred for evaluation by Madie Coffee, She is continually bothered by stabbing vaginal pain and hematuria.     Outpatient Medications Prior to Visit  Medication Sig Dispense Refill  . acetaminophen  (TYLENOL ) 500 MG tablet Take 1,000 mg by mouth 2 (two) times daily.    . amiodarone  (PACERONE ) 200 MG tablet TAKE ONE TABLET BY MOUTH EVERY DAY 90 tablet 3  . aspirin  EC 81 MG tablet Take 1 tablet (81 mg total) by mouth daily. Swallow whole. 30 tablet 12  . cephALEXin  (KEFLEX ) 250 MG capsule Take 250 mg by mouth 2 (two) times daily.    . Cholecalciferol  (VITAMIN D ) 50 MCG (2000 UT) CAPS Take 1 capsule by mouth every other day.    . clopidogrel  (PLAVIX ) 75 MG tablet Take 1 tablet (75 mg total) by mouth daily. 90 tablet 0  . CRANBERRY PO Take 2 capsules by mouth daily.     . ezetimibe  (ZETIA ) 10 MG tablet TAKE ONE TABLET BY MOUTH EVERY DAY 90 tablet 3  . metoprolol  succinate (TOPROL -XL) 25 MG 24 hr tablet TAKE ONE TABLET BY MOUTH EVERY DAY 90 tablet 3  . omeprazole  (PRILOSEC) 20 MG capsule TAKE 1 CAPSULE BY MOUTH ONCE DAILY 90  capsule 3  . losartan  (COZAAR ) 50 MG tablet TAKE ONE TABLET BY MOUTH AT BEDTIME 30 tablet 0  . hydrocortisone (ANUSOL-HC) 25 MG suppository Insert 1 suppository vaginally daily x 2 months for vaginal pain (Patient not taking: Reported on 05/03/2024)    . hyoscyamine  (ANASPAZ ) 0.125 MG TBDP disintergrating tablet Place 1 tablet (0.125 mg total) under the tongue every 6 (six) hours as needed (as needed for esophageal spasm). (Patient not taking: Reported on 05/03/2024) 30 tablet 0  . Probiotic Product (ALIGN PO) Take 1 tablet by mouth daily.      Facility-Administered Medications Prior to Visit  Medication Dose Route Frequency Provider Last  Rate Last Admin  . denosumab  (PROLIA ) injection 60 mg  60 mg Subcutaneous Q6 months Adine Heimann L, MD   60 mg at 01/31/24 0000  . [START ON 08/02/2024] denosumab  (PROLIA ) injection 60 mg  60 mg Subcutaneous Q6 months Marylynn Verneita CROME, MD        Review of Systems;  Patient denies headache, fevers, malaise, unintentional weight loss, skin rash, eye pain, sinus congestion and sinus pain, sore throat, dysphagia,  hemoptysis , cough, dyspnea, wheezing, chest pain, palpitations, orthopnea, edema, abdominal pain, nausea, melena, diarrhea, constipation, flank pain, dysuria, hematuria, urinary  Frequency, nocturia, numbness, tingling, seizures,  Focal weakness, Loss of consciousness,  Tremor, insomnia, depression, anxiety, and suicidal ideation.      Objective:  BP 122/68   Pulse 69   Ht 5' (1.524 m)   Wt 107 lb (48.5 kg)   SpO2 99%   BMI 20.90 kg/m   BP Readings from Last 3 Encounters:  05/03/24 122/68  04/19/24 139/66  04/14/24 105/64    Wt Readings from Last 3 Encounters:  05/03/24 107 lb (48.5 kg)  04/19/24 115 lb 11.9 oz (52.5 kg)  04/14/24 113 lb 12.8 oz (51.6 kg)    Physical Exam Vitals reviewed.  Constitutional:      General: She is not in acute distress.    Appearance: Normal appearance. She is underweight. She is not ill-appearing, toxic-appearing or diaphoretic.  HENT:     Head: Normocephalic.  Eyes:     General: No scleral icterus.       Right eye: No discharge.        Left eye: No discharge.     Conjunctiva/sclera: Conjunctivae normal.  Cardiovascular:     Rate and Rhythm: Normal rate and regular rhythm.     Heart sounds: Normal heart sounds.  Pulmonary:     Effort: Pulmonary effort is normal. No respiratory distress.     Breath sounds: Normal breath sounds.  Musculoskeletal:        General: Normal range of motion.  Skin:    General: Skin is warm and dry.  Neurological:     General: No focal deficit present.     Mental Status: She is alert and oriented to  person, place, and time. Mental status is at baseline.     Cranial Nerves: No cranial nerve deficit.     Sensory: No sensory deficit.  Psychiatric:        Mood and Affect: Mood normal.        Behavior: Behavior normal.        Thought Content: Thought content normal.        Judgment: Judgment normal.     Lab Results  Component Value Date   HGBA1C 4.8 04/18/2024   HGBA1C 5.6 10/18/2023   HGBA1C 5.4 01/05/2020    Lab Results  Component Value  Date   CREATININE 0.96 05/03/2024   CREATININE 1.00 04/18/2024   CREATININE 1.17 (H) 04/18/2024    Lab Results  Component Value Date   WBC 15.5 (H) 05/03/2024   HGB 10.8 (L) 05/03/2024   HCT 33.9 (L) 05/03/2024   PLT 176.0 05/03/2024   GLUCOSE 139 (H) 05/03/2024   CHOL 133 04/18/2024   TRIG 104 04/18/2024   HDL 47 04/18/2024   LDLDIRECT 82.0 10/18/2023   LDLCALC 65 04/18/2024   ALT 20 04/18/2024   AST 30 04/18/2024   NA 142 05/03/2024   K 4.3 05/03/2024   CL 105 05/03/2024   CREATININE 0.96 05/03/2024   BUN 33 (H) 05/03/2024   CO2 22 05/03/2024   TSH 1.35 10/18/2023   INR 1.2 08/30/2022   HGBA1C 4.8 04/18/2024   ECHOCARDIOGRAM COMPLETE BUBBLE STUDY Result Date: 04/18/2024    ECHOCARDIOGRAM REPORT   Patient Name:   XARIA JUDON Date of Exam: 04/18/2024 Medical Rec #:  [id number]     Height:       60.0 in Accession #:    [id number]    Weight:       113.0 lb Date of Birth:  01-02-1932     BSA:          1.464 m Patient Age:    89 years      BP:           157/71 mmHg Patient Gender: F             HR:           59 bpm. Exam Location:  [facility] Procedure: 2D Echo, Cardiac Doppler, Color Doppler and Saline Contrast Bubble            Study (Both Spectral and Color Flow Doppler were utilized during            procedure). Indications:     Stroke 434.91 / I63.9  History:         Patient has prior history of Echocardiogram examinations, most                  recent 05/13/2022. Stroke; Signs/Symptoms:Syncope.  Sonographer:     Ashley McNeely-Sloane  Referring Phys:  [id number] JAN A MANSY Diagnosing Phys: Marsa Dooms MD IMPRESSIONS  1. Left ventricular ejection fraction, by estimation, is 50 to 55%. The left ventricle has low normal function. The left ventricle has no regional wall motion abnormalities. Left ventricular diastolic parameters are consistent with Grade I diastolic dysfunction (impaired relaxation).  2. Right ventricular systolic function is normal. The right ventricular size is normal.  3. The mitral valve is normal in structure. Moderate mitral valve regurgitation. No evidence of mitral stenosis.  4. Tricuspid valve regurgitation is mild to moderate.  5. The aortic valve is normal in structure. Aortic valve regurgitation is mild. No aortic stenosis is present.  6. The inferior vena cava is normal in size with greater than 50% respiratory variability, suggesting right atrial pressure of 3 mmHg.  7. Agitated saline contrast bubble study was negative, with no evidence of any interatrial shunt. FINDINGS  Left Ventricle: Left ventricular ejection fraction, by estimation, is 50 to 55%. The left ventricle has low normal function. The left ventricle has no regional wall motion abnormalities. Strain was performed and the global longitudinal strain is indeterminate. The left ventricular internal cavity size was normal in size. There is no left ventricular hypertrophy. Left ventricular diastolic parameters are consistent with Grade I diastolic dysfunction (impaired relaxation). Right  Ventricle: The right ventricular size is normal. No increase in right ventricular wall thickness. Right ventricular systolic function is normal. Left Atrium: Left atrial size was normal in size. Right Atrium: Right atrial size was normal in size. Pericardium: There is no evidence of pericardial effusion. Mitral Valve: The mitral valve is normal in structure. Moderate mitral valve regurgitation. No evidence of mitral valve stenosis. MV peak gradient, 5.2 mmHg. The mean  mitral valve gradient is 1.0 mmHg. Tricuspid Valve: The tricuspid valve is normal in structure. Tricuspid valve regurgitation is mild to moderate. No evidence of tricuspid stenosis. Aortic Valve: The aortic valve is normal in structure. Aortic valve regurgitation is mild. No aortic stenosis is present. Aortic valve mean gradient measures 4.0 mmHg. Aortic valve peak gradient measures 6.9 mmHg. Aortic valve area, by VTI measures 2.48 cm. Pulmonic Valve: The pulmonic valve was normal in structure. Pulmonic valve regurgitation is not visualized. No evidence of pulmonic stenosis. Aorta: The aortic root is normal in size and structure. Venous: The inferior vena cava is normal in size with greater than 50% respiratory variability, suggesting right atrial pressure of 3 mmHg. IAS/Shunts: No atrial level shunt detected by color flow Doppler. Agitated saline contrast was given intravenously to evaluate for intracardiac shunting. Agitated saline contrast bubble study was negative, with no evidence of any interatrial shunt. Additional Comments: 3D was performed not requiring image post processing on an independent workstation and was indeterminate.  LEFT VENTRICLE PLAX 2D LVIDd:         3.90 cm     Diastology LVIDs:         3.50 cm     LV e' medial:    5.44 cm/s LV PW:         1.10 cm     LV E/e' medial:  10.9 LV IVS:        0.80 cm     LV e' lateral:   6.20 cm/s LVOT diam:     2.00 cm     LV E/e' lateral: 9.5 LV SV:         78 LV SV Index:   53 LVOT Area:     3.14 cm  LV Volumes (MOD) LV vol d, MOD A2C: 60.3 ml LV vol d, MOD A4C: 76.8 ml LV vol s, MOD A2C: 14.4 ml LV vol s, MOD A4C: 30.7 ml LV SV MOD A2C:     45.9 ml LV SV MOD A4C:     76.8 ml LV SV MOD BP:      49.8 ml RIGHT VENTRICLE             IVC RV Basal diam:  5.10 cm     IVC diam: 0.75 cm RV Mid diam:    3.80 cm RV S prime:     16.40 cm/s TAPSE (M-mode): 2.6 cm LEFT ATRIUM             Index        RIGHT ATRIUM           Index LA Vol (A2C):   26.5 ml 18.10 ml/m  RA  Area:     24.20 cm LA Vol (A4C):   22.4 ml 15.30 ml/m  RA Volume:   81.40 ml  55.58 ml/m LA Biplane Vol: 24.6 ml 16.80 ml/m  AORTIC VALVE                    PULMONIC VALVE AV Area (Vmax):    2.66 cm  PV Vmax:        0.88 m/s AV Area (Vmean):   2.38 cm     PV Vmean:       60.700 cm/s AV Area (VTI):     2.48 cm     PV VTI:         0.228 m AV Vmax:           131.00 cm/s  PV Peak grad:   3.1 mmHg AV Vmean:          87.100 cm/s  PV Mean grad:   2.0 mmHg AV VTI:            0.314 m      RVOT Peak grad: 1 mmHg AV Peak Grad:      6.9 mmHg AV Mean Grad:      4.0 mmHg LVOT Vmax:         111.00 cm/s LVOT Vmean:        66.100 cm/s LVOT VTI:          0.248 m LVOT/AV VTI ratio: 0.79  AORTA Ao Root diam: 3.20 cm Ao Asc diam:  2.80 cm MITRAL VALVE               TRICUSPID VALVE MV Area (PHT): 3.37 cm    TR Peak grad:   23.8 mmHg MV Area VTI:   2.87 cm    TR Mean grad:   18.0 mmHg MV Peak grad:  5.2 mmHg    TR Vmax:        244.00 cm/s MV Mean grad:  1.0 mmHg    TR Vmean:       206.0 cm/s MV Vmax:       1.14 m/s MV Vmean:      54.7 cm/s   SHUNTS MV Decel Time: 225 msec    Systemic VTI:  0.25 m MR Peak grad: 17.6 mmHg    Systemic Diam: 2.00 cm MR Vmax:      210.00 cm/s  Pulmonic VTI:  0.140 m MV E velocity: 59.20 cm/s MV A velocity: 81.80 cm/s MV E/A ratio:  0.72 Marsa Dooms MD Electronically signed by Marsa Dooms MD Signature Date/Time: 04/18/2024/1:32:55 PM    Final    CT Head Wo Contrast Result Date: 04/18/2024 EXAM: CT HEAD AND CERVICAL SPINE 04/18/2024 12:50:26 AM TECHNIQUE: CT of the head and cervical spine was performed without the administration of intravenous contrast. Multiplanar reformatted images are provided for review. Automated exposure control, iterative reconstruction, and/or weight based adjustment of the mA/kV was utilized to reduce the radiation dose to as low as reasonably achievable. COMPARISON: 05/09/2023 CLINICAL HISTORY: Head trauma, minor (Age >= 65y). Patient arrives EMS from [facility]. Patient states at lunch time she was picking up something in her bathroom and fell in the shower. After dinner she started complaining of double vision, neck pain, dizziness. LKW: Right before fall. Fall occurred around 1200. 159/79, 94HR, 93% on room air. FINDINGS: CT HEAD BRAIN AND VENTRICLES: No acute intracranial hemorrhage. No mass effect or midline shift. No abnormal extra-axial fluid collection. Gray-white differentiation is maintained. No hydrocephalus. Global cortical atrophy. Subcortical and periventricular small vessel ischemic changes. Intracranial atherosclerosis. ORBITS: No acute abnormality. SINUSES AND MASTOIDS: No acute abnormality. SOFT TISSUES AND SKULL: No acute skull fracture. No acute soft tissue abnormality. CT CERVICAL SPINE BONES AND ALIGNMENT: Chronic type 2 dense fracture. No acute fracture. DEGENERATIVE CHANGES: 4 mm anterolisthesis of C4 on C5. Mild degenerative changes of the  mid cervical spine. SOFT TISSUES: No prevertebral soft tissue swelling. IMPRESSION: 1. No acute intracranial abnormality. Atrophy with small vessel ischemic changes. 2. No acute traumatic injury to the cervical spine. Chronic type 2 dens fracture. Electronically signed by: Pinkie Pebbles MD 04/18/2024 12:54 AM EDT RP Workstation: [id number]   CT Cervical Spine Wo Contrast Result Date: 04/18/2024 EXAM: CT HEAD AND CERVICAL SPINE 04/18/2024 12:50:26 AM TECHNIQUE: CT of the head and cervical spine was performed without the administration of intravenous contrast. Multiplanar reformatted images are provided for review. Automated exposure control, iterative reconstruction, and/or weight based adjustment of the mA/kV was utilized to reduce the radiation dose to as low as reasonably achievable. COMPARISON: 05/09/2023 CLINICAL HISTORY: Head trauma, minor (Age >= 65y). Patient arrives EMS from [facility]. Patient states at lunch time she was picking up something in her bathroom and  fell in the shower. After dinner she started complaining of double vision, neck pain, dizziness. LKW: Right before fall. Fall occurred around 1200. 159/79, 94HR, 93% on room air. FINDINGS: CT HEAD BRAIN AND VENTRICLES: No acute intracranial hemorrhage. No mass effect or midline shift. No abnormal extra-axial fluid collection. Gray-white differentiation is maintained. No hydrocephalus. Global cortical atrophy. Subcortical and periventricular small vessel ischemic changes. Intracranial atherosclerosis. ORBITS: No acute abnormality. SINUSES AND MASTOIDS: No acute abnormality. SOFT TISSUES AND SKULL: No acute skull fracture. No acute soft tissue abnormality. CT CERVICAL SPINE BONES AND ALIGNMENT: Chronic type 2 dense fracture. No acute fracture. DEGENERATIVE CHANGES: 4 mm anterolisthesis of C4 on C5. Mild degenerative changes of the mid cervical spine. SOFT TISSUES: No prevertebral soft tissue swelling. IMPRESSION: 1. No acute intracranial abnormality. Atrophy with small vessel ischemic changes. 2. No acute traumatic injury to the cervical spine. Chronic type 2 dens fracture. Electronically signed by: Pinkie Pebbles MD 04/18/2024 12:54 AM EDT RP Workstation: [id number]   DG Chest Portable 1 View Result Date: 04/18/2024 EXAM: 1 VIEW XRAY OF THE CHEST 04/18/2024 12:32:35 AM COMPARISON: 01/29/2023 CLINICAL HISTORY: Fall. Patient arrives EMS from [facility]. Patient states at lunch time she was picking up something in her bathroom and fell in the shower. After dinner she started complaining of double vision, neck pain, dizziness. LKW: Right before fall. Fall occurred around 1200. 159/79, 94HR, 93% on room air. FINDINGS: LUNGS AND PLEURA: Mild left basilar atelectasis. No focal pulmonary opacity. No pulmonary edema. No pleural effusion. No pneumothorax. HEART AND MEDIASTINUM: No acute abnormality of the cardiac and mediastinal silhouettes. Small hiatal hernia. BONES AND SOFT TISSUES: S-shaped thoracolumbar  scoliosis. Left shoulder degenerative changes. No acute osseous abnormality. IMPRESSION: 1. No acute process. 2. Mild left basilar atelectasis. Electronically signed by: Pinkie Pebbles MD 04/18/2024 12:40 AM EDT RP Workstation: [id number]    Assessment & Plan:  .AKI (acute kidney injury) (HCC) -     Basic metabolic panel with GFR  Essential hypertension -     Losartan  Potassium; Take 1 tablet (50 mg total) by mouth at bedtime.  Dispense: 30 tablet; Refill: 5  Anemia due to stage 3a chronic kidney disease (HCC) -     CBC with Differential/Platelet -     B12 and Folate Panel -     IBC + Ferritin  Dissection of intracranial vertebral artery [facility]) Assessment & Plan: Nonocclusive , by July MRI brain, in the V3 segment of the Right [facility].    Orders: -     Ambulatory referral to Neurology  History of CVA (cerebrovascular accident) without residual deficits Assessment & Plan: Dual  antiplatelet therapy has been prescribed and should not be interrupted for elective procedures for 90 days; neurology referral to Dr Maree is in progress  Orders: -     Ambulatory referral to Neurology  Hospital discharge follow-up Assessment & Plan: Patient is stable post discharge and has no new issues or questions about discharge plans at the visit today for hospital follow up. All labs , imaging studies and progress notes from admission were reviewed with patient today      Diplopia Assessment & Plan: Resolved during hospitalization July 2025  .  Presenting symptom  of CVA after a neck injury that occurred during a fall at home/    Hypertrophic lichen planus of vulva Assessment & Plan: She has been referred to Urogynecology by her gynecologist      Follow-up: No follow-ups on file.   Verneita LITTIE Kettering, MD

## 2024-05-03 NOTE — Progress Notes (Signed)
 Chief Complaint:   [name] is a 89 y.o. female here for Follow-up (Vulvar complaints - doing the clobetasol  in the morning daily but did not use this morning , and uses the estrogen (per urolgist) at nighttime before bed nightly )  Referring provider: Verdon Heather Ruts*  History of Present Illness Brenda Cardenas is a 89 year old female who presents with persistent pain and bruising after a fall.  She experiences burning during urination, particularly on the external genitalia, and has a history of lichen planus. She is using clobetasol  in the morning and estrogen at night. Urinary issues include hematuria, with a cystoscopy scheduled for October. She is taking Sebulex as a precautionary measure.   Past Medical History:  has a past medical history of Allergy, Arthritis, Cervical spine arthritis, Chronic constipation, Chronic diarrhea (2/1-4/19), Diverticulosis (05/08/13), GERD (gastroesophageal reflux disease), History of colonoscopy, Hyperlipidemia, Hypertension, Kidney stone, Osteoarthritis, Osteoporosis, post-menopausal, Scoliosis, Skin cancer, and Spinal stenosis.  Past Surgical History:  has a past surgical history that includes Dilation and curettage of uterus (1986); Wrist ganglion excision (1997); Salivary gland surgery (2006 and 2010); Skin cancer excision; Kidney stone surgery (06/27/2009); Vein Surgery (Left, 09/2009 and 01/2012); Knee arthroscopy (Right, 1992); Lithotripsy; Colonoscopy (01/15/2005, 02/23/2008, 05/08/2013); egd (05/08/2013); Right carpal tunnel release (Right, 07/23/2014); Tonsillectomy and adenoidectomy (1938); Appendectomy (1960); Cholecystectomy (1995); RIGHT TOTAL KNEE REPLACEMENT (04/17/2008); Colonoscopy (2006, 2009, 2014); Upper gastrointestinal endoscopy (8/ 07/2013); Sigmoidoscopy (Too long ago to remember); Joint replacement (2009); and Eye surgery. Family History: family history includes Breast cancer in her paternal aunt; Colon polyps in her mother;  Coronary Artery Disease (Blocked arteries around heart) in an other family member; Diabetes in her father; Diverticulitis in her father; High blood pressure (Hypertension) in her mother; Myocardial Infarction (Heart attack) in her mother. Social History:  reports that she has never smoked. She has never used smokeless tobacco. She reports that she does not drink alcohol and does not use drugs. OB/GYN History:  OB History   No obstetric history on file.    Allergies: is allergic to ciprofloxacin , other, sulfa (sulfonamide antibiotics), sulfasalazine, naproxen , prednisone , acetaminophen , amoxicillin-pot clavulanate, clindamycin, codeine, doxycycline, erythromycin, gabapentin , lidocaine , lincomycin, nabumetone, nitrofurantoin, nitrofurantoin monohyd/m-cryst, nsaids (non-steroidal anti-inflammatory drug), phenobarbital, statins-hmg-coa reductase inhibitors, tolmetin, tramadol, tramadol hcl, and vibegron. Medications: Current Outpatient Medications:  .  acetaminophen  (TYLENOL ) 500 MG tablet, Take 1,000 mg by mouth every 6 (six) hours as needed, Disp: , Rfl:  .  AMIOdarone  (PACERONE ) 200 MG tablet, Take 200 mg by mouth once daily, Disp: , Rfl:  .  amoxicillin (AMOXIL) 500 MG capsule, 2,000 mg, Disp: , Rfl:  .  aspirin  81 MG EC tablet, Take 81 mg by mouth once daily, Disp: , Rfl:  .  Bifidobacterium infantis (ALIGN) 4 mg capsule, Take 1 capsule by mouth once daily., Disp: , Rfl:  .  calcium citrate-vitamin D3 (CITRACAL+D) 315-200 mg-unit tablet, Take 2 tablets by mouth once daily.  , Disp: , Rfl:  .  cephalexin  (KEFLEX ) 250 MG capsule, Take 250 mg by mouth 2 (two) times daily, Disp: , Rfl:  .  cholecalciferol  (VITAMIN D3) 1,000 unit capsule, Take 1,000 Units by mouth once daily.  , Disp: , Rfl:  .  clobetasoL  (TEMOVATE ) 0.05 % cream, Apply topically 2 (two) times daily, Disp: , Rfl:  .  clopidogreL  (PLAVIX ) 75 mg tablet, Take 75 mg by mouth once daily, Disp: , Rfl:  .  cranberry 500 mg Cap, Take 2  capsules by mouth once daily., Disp: , Rfl:  .  estradioL (ESTRACE) 0.01 % (0.1 mg/gram) vaginal cream, Place 0.5 g vaginally once daily, Disp: , Rfl:  .  ezetimibe  (ZETIA ) 10 mg tablet, Take 10 mg by mouth daily.  , Disp: , Rfl:  .  losartan  (COZAAR ) 50 MG tablet, Take 50 mg by mouth at bedtime, Disp: , Rfl:  .  metoprolol  SUCCinate (TOPROL -XL) 25 MG XL tablet, Take 1 tablet by mouth once daily, Disp: , Rfl:  .  omeprazole  (PRILOSEC) 20 MG DR capsule, 1 capsule daily, Disp: , Rfl:  .  chlorhexidine (PERIDEX) 0.12 % solution, , Disp: , Rfl:  .  denosumab  (PROLIA ) 60 mg/mL inj syringe, Inject 60 mg subcutaneously every 6 (six) months.  , Disp: , Rfl:  .  EUA molnupiravir  (LAGEVRIO , EUA,) 200 mg capsule, , Disp: , Rfl:  .  fosfomycin (MONUROL ) 3 gram packet, Take 3 g by mouth once (Patient not taking: Reported on 05/03/2024), Disp: , Rfl:  .  FUROsemide  (LASIX ) 20 MG tablet, , Disp: , Rfl:  .  GEMTESA 75 mg Tab, Take 75 mg by mouth once daily (Patient not taking: Reported on 05/03/2024), Disp: , Rfl:  .  hydrocortisone (ANUSOL-HC) 25 mg suppository, Insert 1 suppository vaginally daily x 2 months for vaginal pain, Disp: 122 suppository, Rfl: 0 .  hyoscyamine  (LEVSIN /SL) 0.125 mg SL tablet, Place 0.125 mg under the tongue every 4 (four) hours as needed    (Patient not taking: Reported on 05/03/2024), Disp: , Rfl:  .  mirabegron (MYRBETRIQ) 25 mg ER Tablet, Take 1 tablet by mouth once daily (Patient not taking: Reported on 05/03/2024), Disp: , Rfl:  .  sertraline  (ZOLOFT ) 50 MG tablet, , Disp: , Rfl:  .  triamcinolone  0.1 % ointment, Apply topically to affected area nightly x 4 weeks until symptoms resolve, then apply nightly as needed when symptomatic (Patient not taking: Reported on 05/03/2024), Disp: 80 g, Rfl: 1  Review of Systems: No SOB, no palpitations or chest pain, no new lower extremity edema, no nausea or vomiting or bowel or bladder complaints. See HPI for gyn specific ROS.   Exam:   BP 116/62    Ht 152.4 cm (5')   Wt 48.9 kg (107 lb 12.8 oz)   BMI 21.05 kg/m    Constitutional:  General appearance: Well nourished, well developed female in no acute distress.  Neuro/psych:  Normal mood and affect. No gross motor deficits. Neck:  Supple, normal appearance.  Respiratory:  Normal respiratory effort, no use of accessory muscles Skin:  No visible rashes or external lesions   Mouth: +reticular pattern on left cheek  Pelvic:   External genitalia: vulva and labia without lesions, Tanner stage 5, +irritation with some loss of architecture, internal suggestion of reticular pattern  Urethra: no prolapse  Vagina: normal atrophy  Impression:   The primary encounter diagnosis was Vaginal pain. Diagnoses of Vulvar burning and Vulvar itching were also pertinent to this visit.  Plan:   Vulvar burning, Ddx steroid induced vulvar atrophy, Lichenification of vulva, lichen planus: Treatment trial of lichen planus  Encouraged vaseline externally prior to voiding F/u with urologist  Assessment & Plan Vulvar lichen planus Chronic vulvar lichen planus with burning sensation during urination, confirmed along edges and inside mouth, though asymptomatic orally. - Prescribe suppository for vaginal insertion. - Continue clobetasol  as directed. - Educated on suppository and clobetasol  use. - Consider healthcare assistance for suppository administration.  Vulvar pruritus Pruritus linked to vulvar lichen planus causing discomfort and burning.   Diagnoses and all orders  for this visit:  Vaginal pain -     hydrocortisone (ANUSOL-HC) 25 mg suppository; Insert 1 suppository vaginally daily x 2 months for vaginal pain  Vulvar burning -     Wet Prep -     hydrocortisone (ANUSOL-HC) 25 mg suppository; Insert 1 suppository vaginally daily x 2 months for vaginal pain  Vulvar itching -     Wet Prep -     hydrocortisone (ANUSOL-HC) 25 mg suppository; Insert 1 suppository vaginally daily x 2 months  for vaginal pain     Return in about 6 months (around 11/03/2024) for follow up - vulvar irritation/pain.  Attestation Statement:   I personally performed the service. (TP)  BETHANY JOHNATHAN PENTON, MD

## 2024-05-03 NOTE — Patient Instructions (Addendum)
 Hydrocortisone suppository  Urogynecology [email or web address]  Call for an Appointment [phone]  [facility] [address] [place], [place] [address]

## 2024-05-03 NOTE — Telephone Encounter (Unsigned)
 Copied from CRM [id number]. Topic: Clinical - Medical Advice >> May 03, 2024  2:57 PM Charolett L wrote: Reason for CRM: Waddell from [facility]  Stated that the patient stated that she needed Clearance for tooth removal And Dr. Marylynn stated that she will not do the clearance. Patient stated that the Hospital needs to do the clearance and taylor from the hospital stated they don't do that  Cb# [phone] >> May 03, 2024  4:01 PM Rosina D wrote: Patient would like to be called in regards to a urologist referral that the doctor at the hospital put in.  Patient stated she can not drive to [place] and she would like to have a referral in [place] instead. Patient would also like to discuss her teeth implant CB [phone]

## 2024-05-03 NOTE — Patient Instructions (Addendum)
 Tell  Dr Aron (your oral surgeon) that you  are now taking Plavix  (clopidogrel )  because you had a stroke,  and you will be taking it for 90 days   The neurologist at the hospital has referred you to a local neurologist , so if you have not been contacted by the end of next week,  please let me know   You will have to take clopidogrel  every day for  at least 90 days to prevent more strokes    Your gynecologist wants you to call [facility] to set up an evaluation for your lichen planus

## 2024-05-03 NOTE — Telephone Encounter (Signed)
 Copied from CRM [id number]. Topic: Clinical - Medical Advice >> May 03, 2024  2:57 PM Charolett L wrote: Reason for CRM: Waddell from [facility]  Stated that the patient stated that she needed Clearance for tooth removal And Dr. Marylynn stated that she will not do the clearance. Patient stated that the Hospital needs to do the clearance and taylor from the hospital stated they don't do that  Cb# [phone]

## 2024-05-04 LAB — CBC WITH DIFFERENTIAL/PLATELET
Basophils Absolute: 0.1 K/uL (ref 0.0–0.1)
Basophils Relative: 0.6 % (ref 0.0–3.0)
Eosinophils Absolute: 0.2 K/uL (ref 0.0–0.7)
Eosinophils Relative: 1.6 % (ref 0.0–5.0)
HCT: 33.9 % — ABNORMAL LOW (ref 36.0–46.0)
Hemoglobin: 10.8 g/dL — ABNORMAL LOW (ref 12.0–15.0)
Lymphocytes Relative: 11.1 % — ABNORMAL LOW (ref 12.0–46.0)
Lymphs Abs: 1.7 K/uL (ref 0.7–4.0)
MCHC: 32 g/dL (ref 30.0–36.0)
MCV: 101.7 fl — ABNORMAL HIGH (ref 78.0–100.0)
Monocytes Absolute: 0.5 K/uL (ref 0.1–1.0)
Monocytes Relative: 2.9 % — ABNORMAL LOW (ref 3.0–12.0)
Neutro Abs: 13 K/uL — ABNORMAL HIGH (ref 1.4–7.7)
Neutrophils Relative %: 83.8 % — ABNORMAL HIGH (ref 43.0–77.0)
Platelets: 176 K/uL (ref 150.0–400.0)
RBC: 3.33 Mil/uL — ABNORMAL LOW (ref 3.87–5.11)
RDW: 23.5 % — ABNORMAL HIGH (ref 11.5–15.5)
WBC: 15.5 K/uL — ABNORMAL HIGH (ref 4.0–10.5)

## 2024-05-04 LAB — BASIC METABOLIC PANEL WITH GFR
BUN: 33 mg/dL — ABNORMAL HIGH (ref 6–23)
CO2: 22 meq/L (ref 19–32)
Calcium: 8.6 mg/dL (ref 8.4–10.5)
Chloride: 105 meq/L (ref 96–112)
Creatinine, Ser: 0.96 mg/dL (ref 0.40–1.20)
GFR: 52.13 mL/min — ABNORMAL LOW (ref >60.00)
Glucose, Bld: 139 mg/dL — ABNORMAL HIGH (ref 70–99)
Potassium: 4.3 meq/L (ref 3.5–5.1)
Sodium: 142 meq/L (ref 135–145)

## 2024-05-04 LAB — B12 AND FOLATE PANEL
Folate: 15.5 ng/mL (ref >5.9)
Vitamin B-12: 1121 pg/mL — ABNORMAL HIGH (ref 211–911)

## 2024-05-04 LAB — IBC + FERRITIN
Ferritin: 159.7 ng/mL (ref 10.0–291.0)
Iron: 75 ug/dL (ref 42–145)
Saturation Ratios: 21.3 % (ref 20.0–50.0)
TIBC: 352.8 ug/dL (ref 250.0–450.0)
Transferrin: 252 mg/dL (ref 212.0–360.0)

## 2024-05-04 NOTE — Telephone Encounter (Signed)
 Spoke with Brenda Cardenas to let her know that Dr. Marylynn did not clear her for the tooth extraction because she needs to see neurology first.

## 2024-05-05 ENCOUNTER — Encounter: Payer: Self-pay | Admitting: Internal Medicine

## 2024-05-05 DIAGNOSIS — Z8673 Personal history of transient ischemic attack (TIA), and cerebral infarction without residual deficits: Secondary | ICD-10-CM | POA: Insufficient documentation

## 2024-05-05 DIAGNOSIS — I7774 Dissection of vertebral artery: Secondary | ICD-10-CM | POA: Insufficient documentation

## 2024-05-05 NOTE — Telephone Encounter (Signed)
 Pt would like to know if we change her referral to a neurology office here in  so she doesn't have to find a ride to [employer].

## 2024-05-05 NOTE — Assessment & Plan Note (Signed)
Patient is stable post discharge and has no new issues or questions about discharge plans at the visit today for hospital follow up. All labs , imaging studies and progress notes from admission were reviewed with patient today   

## 2024-05-05 NOTE — Assessment & Plan Note (Signed)
 Nonocclusive , by July MRI brain, in the V3 segment of the Right VA.

## 2024-05-05 NOTE — Assessment & Plan Note (Signed)
 Resolved during hospitalization July 2025  .  Presenting symptom  of CVA after a neck injury that occurred during a fall at home/

## 2024-05-05 NOTE — Assessment & Plan Note (Signed)
 She has been referred to Urogynecology by her gynecologist

## 2024-05-05 NOTE — Assessment & Plan Note (Signed)
 Dual antiplatelet therapy has been prescribed and should not be interrupted for elective procedures for 90 days; neurology referral to Dr Maree is in progress

## 2024-05-07 ENCOUNTER — Ambulatory Visit: Payer: Self-pay | Admitting: Internal Medicine

## 2024-05-08 ENCOUNTER — Encounter: Payer: Self-pay | Admitting: Internal Medicine

## 2024-05-09 ENCOUNTER — Encounter: Payer: Self-pay | Admitting: Internal Medicine

## 2024-05-12 ENCOUNTER — Inpatient Hospital Stay: Admitting: Internal Medicine

## 2024-05-15 DIAGNOSIS — R278 Other lack of coordination: Secondary | ICD-10-CM | POA: Diagnosis not present

## 2024-05-15 DIAGNOSIS — R2689 Other abnormalities of gait and mobility: Secondary | ICD-10-CM | POA: Diagnosis not present

## 2024-05-15 DIAGNOSIS — R296 Repeated falls: Secondary | ICD-10-CM | POA: Diagnosis not present

## 2024-05-15 DIAGNOSIS — M6281 Muscle weakness (generalized): Secondary | ICD-10-CM | POA: Diagnosis not present

## 2024-05-17 ENCOUNTER — Ambulatory Visit: Admitting: Internal Medicine

## 2024-05-17 DIAGNOSIS — R278 Other lack of coordination: Secondary | ICD-10-CM | POA: Diagnosis not present

## 2024-05-17 DIAGNOSIS — R2689 Other abnormalities of gait and mobility: Secondary | ICD-10-CM | POA: Diagnosis not present

## 2024-05-17 DIAGNOSIS — R296 Repeated falls: Secondary | ICD-10-CM | POA: Diagnosis not present

## 2024-05-17 DIAGNOSIS — M6281 Muscle weakness (generalized): Secondary | ICD-10-CM | POA: Diagnosis not present

## 2024-05-24 ENCOUNTER — Ambulatory Visit: Admitting: Cardiology

## 2024-05-29 DIAGNOSIS — R278 Other lack of coordination: Secondary | ICD-10-CM | POA: Diagnosis not present

## 2024-05-29 DIAGNOSIS — R296 Repeated falls: Secondary | ICD-10-CM | POA: Diagnosis not present

## 2024-05-29 DIAGNOSIS — R2689 Other abnormalities of gait and mobility: Secondary | ICD-10-CM | POA: Diagnosis not present

## 2024-05-31 DIAGNOSIS — R278 Other lack of coordination: Secondary | ICD-10-CM | POA: Diagnosis not present

## 2024-06-05 ENCOUNTER — Ambulatory Visit: Admitting: Podiatry

## 2024-06-07 ENCOUNTER — Ambulatory Visit: Admitting: Podiatry

## 2024-06-07 DIAGNOSIS — H43811 Vitreous degeneration, right eye: Secondary | ICD-10-CM | POA: Diagnosis not present

## 2024-06-07 DIAGNOSIS — H40003 Preglaucoma, unspecified, bilateral: Secondary | ICD-10-CM | POA: Diagnosis not present

## 2024-06-07 DIAGNOSIS — Z9889 Other specified postprocedural states: Secondary | ICD-10-CM | POA: Diagnosis not present

## 2024-06-07 DIAGNOSIS — Z8673 Personal history of transient ischemic attack (TIA), and cerebral infarction without residual deficits: Secondary | ICD-10-CM | POA: Diagnosis not present

## 2024-06-07 DIAGNOSIS — R2689 Other abnormalities of gait and mobility: Secondary | ICD-10-CM | POA: Diagnosis not present

## 2024-06-07 DIAGNOSIS — R296 Repeated falls: Secondary | ICD-10-CM | POA: Diagnosis not present

## 2024-06-07 DIAGNOSIS — R278 Other lack of coordination: Secondary | ICD-10-CM | POA: Diagnosis not present

## 2024-06-07 DIAGNOSIS — M6281 Muscle weakness (generalized): Secondary | ICD-10-CM | POA: Diagnosis not present

## 2024-06-07 DIAGNOSIS — Z961 Presence of intraocular lens: Secondary | ICD-10-CM | POA: Diagnosis not present

## 2024-06-14 DIAGNOSIS — R296 Repeated falls: Secondary | ICD-10-CM | POA: Diagnosis not present

## 2024-06-21 DIAGNOSIS — Z7409 Other reduced mobility: Secondary | ICD-10-CM | POA: Diagnosis not present

## 2024-06-21 DIAGNOSIS — I639 Cerebral infarction, unspecified: Secondary | ICD-10-CM | POA: Diagnosis not present

## 2024-06-21 DIAGNOSIS — Z1331 Encounter for screening for depression: Secondary | ICD-10-CM | POA: Diagnosis not present

## 2024-06-27 NOTE — Progress Notes (Unsigned)
  Electrophysiology Office Follow up Visit Note:    Date:  06/27/2024   ID:  Brenda Cardenas, DOB 1932/03/02, MRN [id number]  PCP:  Marylynn Verneita CROME, MD  [facility] Cardiologist:  Lonni Hanson, MD  [facility] Electrophysiologist:  OLE ONEIDA HOLTS, MD    Interval History:     Brenda Cardenas is a 89 y.o. female who presents for a follow up visit.   I last saw the patient November 17, 2023 for her SVT managed with amiodarone .  She was doing well at the last appointment without recurrence.        Past medical, surgical, social and family history were reviewed.  ROS:   Please see the history of present illness.    All other systems reviewed and are negative.  EKGs/Labs/Other Studies Reviewed:    The following studies were reviewed today:          Physical Exam:    VS:  There were no vitals taken for this visit.    Wt Readings from Last 3 Encounters:  05/03/24 107 lb (48.5 kg)  04/19/24 115 lb 11.9 oz (52.5 kg)  04/14/24 113 lb 12.8 oz (51.6 kg)     GEN: no distress CARD: RRR, No MRG RESP: No IWOB. CTAB.      ASSESSMENT:    No diagnosis found. PLAN:    In order of problems listed above:  #SVT #High risk med monitoring-amiodarone  Last liver blood work okay in July 2025 Continue amiodarone  200 mg by mouth once daily  #Hypertension *** goal today.  Recommend checking blood pressures 1-2 times per week at home and recording the values.  Recommend bringing these recordings to the primary care physician.  Follow-up 6 months with APP  Signed, OLE HOLTS, MD, [facility], [facility] 06/27/2024 8:44 PM    Electrophysiology [facility]

## 2024-06-28 ENCOUNTER — Encounter: Payer: Self-pay | Admitting: Cardiology

## 2024-06-28 ENCOUNTER — Ambulatory Visit: Attending: Cardiology | Admitting: Cardiology

## 2024-06-28 ENCOUNTER — Other Ambulatory Visit: Payer: Self-pay | Admitting: Internal Medicine

## 2024-06-28 VITALS — BP 122/72 | HR 57 | Ht 60.0 in | Wt 108.4 lb

## 2024-06-28 DIAGNOSIS — Z79899 Other long term (current) drug therapy: Secondary | ICD-10-CM

## 2024-06-28 DIAGNOSIS — I471 Supraventricular tachycardia, unspecified: Secondary | ICD-10-CM

## 2024-06-28 NOTE — Patient Instructions (Signed)
 Medication Instructions:  Your physician recommends that you continue on your current medications as directed. Please refer to the Current Medication list given to you today.  *If you need a refill on your cardiac medications before your next appointment, please call your pharmacy*  Follow-Up: At [facility], you and your health needs are our priority.  As part of our continuing mission to provide you with exceptional heart care, our providers are all part of one team.  This team includes your primary Cardiologist (physician) and Advanced Practice Providers or APPs (Physician Assistants and Nurse Practitioners) who all work together to provide you with the care you need, when you need it.  Your next appointment:   6 months  Provider:   Suzann Riddle, NP

## 2024-06-29 ENCOUNTER — Ambulatory Visit: Admitting: Internal Medicine

## 2024-06-29 ENCOUNTER — Encounter: Payer: Self-pay | Admitting: Internal Medicine

## 2024-06-29 VITALS — BP 122/68 | HR 63 | Ht 60.0 in | Wt 107.8 lb

## 2024-06-29 DIAGNOSIS — Z7189 Other specified counseling: Secondary | ICD-10-CM | POA: Diagnosis not present

## 2024-06-29 DIAGNOSIS — I471 Supraventricular tachycardia, unspecified: Secondary | ICD-10-CM | POA: Diagnosis not present

## 2024-06-29 DIAGNOSIS — L439 Lichen planus, unspecified: Secondary | ICD-10-CM | POA: Diagnosis not present

## 2024-06-29 DIAGNOSIS — R634 Abnormal weight loss: Secondary | ICD-10-CM

## 2024-06-29 DIAGNOSIS — I10 Essential (primary) hypertension: Secondary | ICD-10-CM | POA: Diagnosis not present

## 2024-06-29 DIAGNOSIS — I7774 Dissection of vertebral artery: Secondary | ICD-10-CM

## 2024-06-29 DIAGNOSIS — D649 Anemia, unspecified: Secondary | ICD-10-CM

## 2024-06-29 NOTE — Assessment & Plan Note (Signed)
DNR status has been requested by patient after risks and benefits of the order were discussed today .  Order given to patient and placed in chart. ?

## 2024-06-29 NOTE — Assessment & Plan Note (Signed)
 Managed with amiodarone  and metoprolol  per EP.  No changes today

## 2024-06-29 NOTE — Patient Instructions (Signed)
 You are doing very well!!  I'm glad you are supplementing your meals and feeling  better  We will not refill the plavix  since you are advised to stop it on October 22   We discussed  what you would want done if your heart would stop beating ,  and you have stated that you do not want to be resuscitated.  DNR status has been added to your chart ,  please display the orange ordered on your refrigerator

## 2024-06-29 NOTE — Assessment & Plan Note (Signed)
 ADVISED to resume clobetasol  cream as her dysuria may be from LP recurrence.  She has been referred by Dr Verdon her GYN to a specialist at [facility]

## 2024-06-29 NOTE — Assessment & Plan Note (Signed)
 Nonocclusive , by July MRI brain, in the V3 segment of the Right VA secondary to neck injury during fall, resulting in embolic CVA .  Per neurology consult, continue plavix  until October 22

## 2024-06-29 NOTE — Assessment & Plan Note (Signed)
 A second protein shake has been added to her diet.

## 2024-06-29 NOTE — Progress Notes (Signed)
 Subjective:  Patient ID: Brenda Cardenas, female    DOB: Sep 01, 1932  Age: 89 y.o. MRN: [id number]  CC: The primary encounter diagnosis was DNR (do not resuscitate) discussion. Diagnoses of Anemia, unspecified type, Atrophic lichen planus, Dissection of intracranial vertebral artery, Essential hypertension, Supraventricular tachycardia, and Weight loss, unintentional were also pertinent to this visit.   HPI Brenda Cardenas presents for  Chief Complaint  Patient presents with   Medical Management of Chronic Issues    1 month follow up    One month follow up  on July hospitalization  for   Right cerebellar embolic appearing stroke, secondary to vertebral artery dissection  which was attributed to her a  neck injury during a fall in the shower.  .    Neurology eval by Maree in mid September reviewed:  Recent right cerebellar stroke on May 18, 2024, resulting in balance and potential vision problems. The stroke was small in size and is expected to heal slowly. No high risk of future strokes identified. Bruising likely due to antiplatelet therapy. - Continue aspirin  and Plavix  until July 19, 2024, then discontinue Plavix .   Had a skin tear on right MCP  (thumb)  several weeks ago, bled profusely initially   Unerweight : she is supplementing   Boost high protein and  Ensure high protein added recently as a 2nd supplement.   SVT:  saw Cindie recenlty,  advised.  To Continue monitoring of thyroid  and liver enzymes while continuing amiodarone  200 mg once daily indefinitely.    Living independently at [place] ,  does her own grocery shopping occasionally,   has a home health aide  most weekday mornings  through [facility],  and [facility] to have PT with Alan currently twice weekly .  Has a pill box that is provided weekly by [facility].  Am and pm meds    Bothered by leg weakness. Using a rolling walker with a seat .   Outpatient Medications Prior to Visit   Medication Sig Dispense Refill   acetaminophen  (TYLENOL ) 500 MG tablet Take 1,000 mg by mouth 2 (two) times daily.     amiodarone  (PACERONE ) 200 MG tablet TAKE ONE TABLET BY MOUTH EVERY DAY 90 tablet 3   aspirin  EC 81 MG tablet Take 1 tablet (81 mg total) by mouth daily. Swallow whole. 30 tablet 12   cephALEXin  (KEFLEX ) 250 MG capsule Take 250 mg by mouth 2 (two) times daily.     Cholecalciferol  (VITAMIN D ) 50 MCG (2000 UT) CAPS Take 1 capsule by mouth every other day.     clopidogrel  (PLAVIX ) 75 MG tablet Take 1 tablet (75 mg total) by mouth daily. 90 tablet 0   CRANBERRY PO Take 2 capsules by mouth daily.      ezetimibe  (ZETIA ) 10 MG tablet TAKE ONE TABLET BY MOUTH EVERY DAY 90 tablet 3   hydrocortisone (ANUSOL-HC) 25 MG suppository Insert 1 suppository vaginally daily x 2 months for vaginal pain (Patient taking differently: Place 25 mg rectally 2 (two) times daily as needed. Insert 1 suppository vaginally daily x 2 months for vaginal pain)     hyoscyamine  (ANASPAZ ) 0.125 MG TBDP disintergrating tablet Place 1 tablet (0.125 mg total) under the tongue every 6 (six) hours as needed (as needed for esophageal spasm). 30 tablet 0   losartan  (COZAAR ) 50 MG tablet Take 1 tablet (50 mg total) by mouth at bedtime. 30 tablet 5   metoprolol  succinate (TOPROL -XL) 25 MG  24 hr tablet TAKE ONE TABLET BY MOUTH ONCE DAILY 30 tablet 0   omeprazole  (PRILOSEC) 20 MG capsule TAKE 1 CAPSULE BY MOUTH ONCE DAILY 90 capsule 3   Facility-Administered Medications Prior to Visit  Medication Dose Route Frequency Provider Last Rate Last Admin   denosumab  (PROLIA ) injection 60 mg  60 mg Subcutaneous Q6 months Levada Bowersox L, MD   60 mg at 01/31/24 0000   [START ON 08/02/2024] denosumab  (PROLIA ) injection 60 mg  60 mg Subcutaneous Q6 months Marylynn Verneita CROME, MD        Review of Systems;  Patient denies headache, fevers, malaise, unintentional weight loss, skin rash, eye pain, sinus congestion and sinus pain, sore throat,  dysphagia,  hemoptysis , cough, dyspnea, wheezing, chest pain, palpitations, orthopnea, edema, abdominal pain, nausea, melena, diarrhea, constipation, flank pain, dysuria, hematuria, urinary  Frequency, nocturia, numbness, tingling, seizures,  Focal weakness, Loss of consciousness,  Tremor, insomnia, depression, anxiety, and suicidal ideation.      Objective:  BP 122/68   Pulse 63   Ht 5' (1.524 m)   Wt 107 lb 12.8 oz (48.9 kg)   SpO2 98%   BMI 21.05 kg/m   BP Readings from Last 3 Encounters:  06/29/24 122/68  06/28/24 122/72  05/03/24 122/68    Wt Readings from Last 3 Encounters:  06/29/24 107 lb 12.8 oz (48.9 kg)  06/28/24 108 lb 6 oz (49.2 kg)  05/03/24 107 lb (48.5 kg)    Physical Exam Vitals reviewed.  Constitutional:      General: She is not in acute distress.    Appearance: Normal appearance. She is normal weight. She is not ill-appearing, toxic-appearing or diaphoretic.  HENT:     Head: Normocephalic.  Eyes:     General: No scleral icterus.       Right eye: No discharge.        Left eye: No discharge.     Conjunctiva/sclera: Conjunctivae normal.  Cardiovascular:     Rate and Rhythm: Normal rate and regular rhythm.     Heart sounds: Normal heart sounds.  Pulmonary:     Effort: Pulmonary effort is normal. No respiratory distress.     Breath sounds: Normal breath sounds.  Musculoskeletal:        General: Normal range of motion.  Skin:    General: Skin is warm and dry.  Neurological:     General: No focal deficit present.     Mental Status: She is alert and oriented to person, place, and time. Mental status is at baseline.  Psychiatric:        Mood and Affect: Mood normal.        Behavior: Behavior normal.        Thought Content: Thought content normal.        Judgment: Judgment normal.     Lab Results  Component Value Date   HGBA1C 4.8 04/18/2024   HGBA1C 5.6 10/18/2023   HGBA1C 5.4 01/05/2020    Lab Results  Component Value Date   CREATININE  0.96 05/03/2024   CREATININE 1.00 04/18/2024   CREATININE 1.17 (H) 04/18/2024    Lab Results  Component Value Date   WBC 15.5 (H) 05/03/2024   HGB 10.8 (L) 05/03/2024   HCT 33.9 (L) 05/03/2024   PLT 176.0 05/03/2024   GLUCOSE 139 (H) 05/03/2024   CHOL 133 04/18/2024   TRIG 104 04/18/2024   HDL 47 04/18/2024   LDLDIRECT 82.0 10/18/2023   LDLCALC 65 04/18/2024   ALT  20 04/18/2024   AST 30 04/18/2024   NA 142 05/03/2024   K 4.3 05/03/2024   CL 105 05/03/2024   CREATININE 0.96 05/03/2024   BUN 33 (H) 05/03/2024   CO2 22 05/03/2024   TSH 1.35 10/18/2023   INR 1.2 08/30/2022   HGBA1C 4.8 04/18/2024   Assessment & Plan:  .DNR (do not resuscitate) discussion Assessment & Plan: DNR status has been requested by patient after risks and benefits of the order were discussed today .  Order given to patient and placed in chart.   Orders: -     Do not attempt resuscitation (DNR)  Anemia, unspecified type Assessment & Plan: Secondary to GI losses noted in September 2023  .  Improving,  Iron studies are normal,  b12/folate are normal.   Lab Results  Component Value Date   WBC 15.5 (H) 05/03/2024   HGB 10.8 (L) 05/03/2024   HCT 33.9 (L) 05/03/2024   MCV 101.7 (H) 05/03/2024   PLT 176.0 05/03/2024      Atrophic lichen planus Assessment & Plan: ADVISED to resume clobetasol  cream as her dysuria may be from LP recurrence.  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  Per neurology consult, continue plavix  until October 22    Essential hypertension Assessment & Plan: Improved control with addition of losartan  50 mg daily for new onset proteinuria  No results found for: LABMICR, MICROALBUR       Supraventricular tachycardia Assessment &  Plan: Managed with amiodarone  and metoprolol  per EP.  No changes today    Weight loss, unintentional Assessment & Plan: A second protein shake has been added to her diet.    I personally spent a total of 30 minutes in the care of the patient today including preparing to see the patient, getting/reviewing separately obtained history, performing a medically appropriate exam/evaluation, counseling and educating, placing orders, documenting clinical information in the EHR, and independently interpreting results.   Follow-up: Return in about 6 months (around 12/28/2024).   Verneita LITTIE Kettering, MD

## 2024-06-29 NOTE — Assessment & Plan Note (Signed)
 Secondary to GI losses noted in September 2023  .  Improving,  Iron studies are normal,  b12/folate are normal.   Lab Results  Component Value Date   WBC 15.5 (H) 05/03/2024   HGB 10.8 (L) 05/03/2024   HCT 33.9 (L) 05/03/2024   MCV 101.7 (H) 05/03/2024   PLT 176.0 05/03/2024

## 2024-06-29 NOTE — Assessment & Plan Note (Signed)
 Improved control with addition of losartan  50 mg daily for new onset proteinuria  No results found for: LABMICR, MICROALBUR

## 2024-07-03 ENCOUNTER — Telehealth: Payer: Self-pay

## 2024-07-03 DIAGNOSIS — M810 Age-related osteoporosis without current pathological fracture: Secondary | ICD-10-CM

## 2024-07-03 NOTE — Telephone Encounter (Signed)
 Prolia  VOB initiated via [email or web address]  Next Prolia  inj DUE: 08/02/24

## 2024-07-04 ENCOUNTER — Other Ambulatory Visit (HOSPITAL_COMMUNITY): Payer: Self-pay

## 2024-07-04 NOTE — Telephone Encounter (Signed)
 Pt ready for scheduling for PROLIA  on or after : 08/02/24  Option# 1: Buy/Bill (Office supplied medication)  Out-of-pocket cost due at time of clinic visit: $372  Number of injection/visits approved: 2  Primary: [employer] Prolia  co-insurance: 20% Admin fee co-insurance: $40  Secondary: --- Prolia  co-insurance:  Admin fee co-insurance:   Medical Benefit Details: Date Benefits were checked: 07/04/24 Deductible: NO/ Coinsurance: 20%/ Admin Fee: $40  Prior Auth: APPROVED PA# [id number] Expiration Date: 11/02/23-09/27/24  # of doses approved: 2 ----------------------------------------------------------------------- Option# 2- Med Obtained from pharmacy:  Pharmacy benefit: Copay $64 (Paid to pharmacy) Admin Fee: $40 (Pay at clinic)  Prior Auth: N/A PA# Expiration Date:   # of doses approved:   If patient wants fill through the pharmacy benefit please send prescription to: [facility], and include estimated need by date in rx notes. Pharmacy will ship medication directly to the office.  Patient NOT eligible for Prolia  Copay Card. Copay Card can make patient's cost as little as $25. Link to apply: [email or web address]://www.amgensupportplus.com/copay  ** This summary of benefits is an estimation of the patient's out-of-pocket cost. Exact cost may very based on individual plan coverage.

## 2024-07-04 NOTE — Telephone Encounter (Signed)
 Brenda Cardenas

## 2024-07-07 ENCOUNTER — Ambulatory Visit: Payer: Self-pay

## 2024-07-07 NOTE — Telephone Encounter (Signed)
 FYI Only or Action Required?: FYI only for provider.  Patient was last seen in primary care on 06/29/2024 by Marylynn Verneita CROME, MD.  Called Nurse Triage reporting Vaginal Pain.  Symptoms began yesterday.  Interventions attempted: Nothing.  Symptoms are: gradually worsening.  Triage Disposition: See PCP When Office is Open (Within 3 Days)  Patient/caregiver understands and will follow disposition?: No, wishes to speak with PCP             Copied from CRM #[id number]. Topic: Clinical - Red Word Triage >> Jul 07, 2024  9:10 AM Martinique E wrote: [facility] that prompted transfer to Nurse Triage: Potential UTI, leaky bladder, severe burning when urinating and vaginal pain. Reason for Disposition  [1] Symptoms of a yeast infection (i.e., itchy, white discharge, not bad smelling) AND [2] not improved > 3 days following Care Advice  Answer Assessment - Initial Assessment Questions This RN offered to schedule patient with another provider and she refused. She states she only wants to see PCP for symptoms and states her provider is aware of the issue that has been ongoing for 3 years. Called [facility] and her PCP is off for the day.    1. SYMPTOM: What's the main symptom you're concerned about? (e.g., pain, itching, dryness)     Burning  2. LOCATION: Where is the  Burning located? (e.g., inside/outside, left/right)     Vagina and vulva area  3. ONSET: When did the  Burning  start?     Yesterday  4. PAIN: Is there any pain? If Yes, ask: How bad is it? (Scale: 1-10; mild, moderate, severe)     Severe  5. ITCHING: Is there any itching? If Yes, ask: How bad is it? (Scale: 1-10; mild, moderate, severe)     Denies  7. OTHER SYMPTOMS: Do you have any other symptoms? (e.g., fever, itching, vaginal bleeding, pain with urination, injury to genital area, vaginal foreign body)     Difficulty urinating, leaky bladder, vaginal pain  Protocols used: Vaginal Symptoms-A-AH

## 2024-07-07 NOTE — Telephone Encounter (Signed)
 Spoke with pt and she stated that she is starting to feel better so she is going to hold off on having her urine checked. Pt was advised that if her symptoms worsen then she should be evaluated over the weekend at an urgent care. Pt gave a verbal understanding.

## 2024-07-08 ENCOUNTER — Encounter: Payer: Self-pay | Admitting: Emergency Medicine

## 2024-07-08 ENCOUNTER — Ambulatory Visit
Admission: EM | Admit: 2024-07-08 | Discharge: 2024-07-08 | Disposition: A | Discharge status: Home / Self Care | Attending: Emergency Medicine | Admitting: Emergency Medicine

## 2024-07-08 DIAGNOSIS — R300 Dysuria: Secondary | ICD-10-CM | POA: Diagnosis not present

## 2024-07-08 DIAGNOSIS — R350 Frequency of micturition: Secondary | ICD-10-CM | POA: Insufficient documentation

## 2024-07-08 LAB — POCT URINE DIPSTICK
Bilirubin, UA: NEGATIVE
Glucose, UA: NEGATIVE mg/dL
Ketones, POC UA: NEGATIVE mg/dL
Nitrite, UA: NEGATIVE
POC PROTEIN,UA: 100 — AB
Spec Grav, UA: 1.025 (ref 1.010–1.025)
Urobilinogen, UA: 0.2 U/dL
pH, UA: 5.5 (ref 5.0–8.0)

## 2024-07-08 NOTE — Discharge Instructions (Addendum)
 Your urinalysis shows Brenda Cardenas blood cells and blood but does not show nitrates which is the indication for bacteria which are indicative of infection, your urine will be sent to the lab to determine exactly which bacteria is present  If bacteria is present in your urine you will be notified because you will need to start antibiotics, at that time you can refuse treatment and reach out to your doctor for further management  Increase your fluid intake through use of water  As always practice good hygiene, wiping front to back and avoidance of scented vaginal products to prevent further irritation  If symptoms continue to persist after use of medication or recur please follow-up with urgent care or your primary doctor as needed

## 2024-07-08 NOTE — ED Triage Notes (Signed)
 Patient reports painful urination and blood in urine. Patient states that she is not here to get medication. . Denies pain at this time.

## 2024-07-08 NOTE — ED Provider Notes (Signed)
 [facility]    CSN: [id number] Arrival date & time: 07/08/24  1030      History   Chief Complaint Chief Complaint  Patient presents with   Hematuria   Dysuria    HPI Brenda Cardenas is a 89 y.o. female.   Presents for evaluation of dysuria, difficulty emptying bladder and urinary frequency beginning 1 to 2 days ago, symptoms have improved but due to history concern for urinary infection.  Endorses hematuria to the urine over the past 3 years, has not worsened.  Denies abdominal, flank pain, fever or vaginal symptoms.  History of lichens planus.  followed by gynecology, urogynecology and urology.  Past Medical History:  Diagnosis Date   Allergy Dates in my chart   Anxiety Since I have  SVT   Arrhythmia 11:12/22   Arthritis July 2023   In my left knee   Basal cell carcinoma 06/04/2010   left upper back   Cataract ?   Chronic kidney disease    Colon polyps    Gastritis    GERD (gastroesophageal reflux disease)    Hiatal hernia    Hyperlipidemia    Hypertension    IBS (irritable bowel syndrome)    Low back pain radiating to left leg 03/28/2020   Sciatica, left side 04/15/2020   Squamous cell carcinoma of skin 06/26/2013   left mid pretibial   Squamous cell carcinoma of skin 08/21/2015   lower sternum   Squamous cell carcinoma of skin 10/14/2017   right mid pretibial   Squamous cell carcinoma of skin 03/17/2018   right prox lateral elbow   Squamous cell carcinoma of skin 09/07/2018   right post thigh   Squamous cell carcinoma of skin 05/11/2019   right mid med pretibial   Squamous cell carcinoma of skin 06/20/2019   right bicep   Supraventricular tachycardia     Patient Active Problem List   Diagnosis Date Noted   DNR (do not resuscitate) discussion 06/29/2024   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   History of CVA (cerebrovascular accident) without residual deficits 05/05/2024   Diplopia 04/18/2024   Fracture of inferior pubic  ramus (HCC) 10/22/2023   Arthritis of right hip 10/19/2023   Pain of right thigh 10/19/2023   Weight loss, unintentional 10/04/2023   Lateral epicondylitis 09/16/2023   Hypertrophic lichen planus of vulva 08/24/2023   Contraindication to statin medication 01/21/2023   Lymphedema 01/21/2023   Other nondisplaced dens fracture, sequela 11/16/2022   Closed rib fracture 11/16/2022   History of fracture due to fall 11/16/2022   Hospital discharge follow-up 09/14/2022   GERD without esophagitis 08/30/2022   Dyslipidemia 08/30/2022   Leg edema 08/30/2022   Polyp of descending colon 07/30/2022   COVID-19 07/02/2022   Anemia 05/31/2022   Palpitations 05/31/2022   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/2022   SVT (supraventricular tachycardia) 08/13/2021   Supraventricular tachycardia 08/13/2021   Left ventricular diastolic dysfunction 11/04/2020   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   Aortic atherosclerosis 07/08/2020   Primary osteoarthritis of left knee 04/21/2020   Sciatica, left side 04/15/2020   BPPV (benign paroxysmal positional vertigo) 03/14/2019   Educated about COVID-19 virus infection 03/14/2019   Mixed hyperlipidemia 06/20/2018   Varicose veins of bilateral lower extremities with other complications 02/18/2018   Constipation 07/14/2016   Venous stasis of both lower extremities 06/23/2016   Essential hypertension 12/24/2015   History of colonic polyps 12/24/2015   Spinal stenosis of lumbar region 08/13/2015   Idiopathic scoliosis  06/24/2015   Rhinitis due food 12/17/2014   Osteoarthritis of cervical spine 02/25/2014   Osteoporosis 06/18/2013   Hiatal hernia 06/16/2013   Diaphragmatic hernia 06/16/2013   S/P total knee replacement 05/02/2013   Artificial knee joint present 05/02/2013   History of urinary stone 11/03/2012    Past Surgical History:  Procedure Laterality Date   APPENDECTOMY  1959   CARPAL TUNNEL RELEASE     CATARACT EXTRACTION W/ INTRAOCULAR LENS  IMPLANT Left    CATARACT EXTRACTION W/PHACO Right 04/20/2018   Procedure: CATARACT EXTRACTION PHACO AND INTRAOCULAR LENS PLACEMENT (IOC)  RIGHT;  Surgeon: Mittie Gaskin, MD;  Location: [facility];  Service: Ophthalmology;  Laterality: Right;   CHOLECYSTECTOMY  1995   COLONOSCOPY WITH PROPOFOL  N/A 07/30/2022   Procedure: COLONOSCOPY WITH PROPOFOL ;  Surgeon: Unk Corinn Skiff, MD;  Location: [facility];  Service: Gastroenterology;  Laterality: N/A;   EYE SURGERY  04/07/22   GANGLION CYST EXCISION     JOINT REPLACEMENT Right 03/2008   [place]    KNEE ARTHROSCOPY     LITHOTRIPSY     SALIVARY GLAND SURGERY     SKIN CANCER EXCISION     TONSILLECTOMY     TONSILLECTOMY AND ADENOIDECTOMY     VEIN LIGATION      OB History   No obstetric history on file.      Home Medications    Prior to Admission medications   Medication Sig Start Date End Date Taking? Authorizing Provider  acetaminophen  (TYLENOL ) 500 MG tablet Take 1,000 mg by mouth 2 (two) times daily.    [provider]  amiodarone  (PACERONE ) 200 MG tablet TAKE ONE TABLET BY MOUTH EVERY DAY 10/22/23   Marylynn Verneita CROME, MD  aspirin  EC 81 MG tablet Take 1 tablet (81 mg total) by mouth daily. Swallow whole. 04/19/24   Arlon Carliss ORN, DO  cephALEXin  (KEFLEX ) 250 MG capsule Take 250 mg by mouth 2 (two) times daily.    [provider]  Cholecalciferol  (VITAMIN D ) 50 MCG (2000 UT) CAPS Take 1 capsule by mouth every other day.    [provider]  clopidogrel  (PLAVIX ) 75 MG tablet Take 1 tablet (75 mg total) by mouth daily. 04/20/24   Arlon Carliss ORN, DO  CRANBERRY PO Take 2 capsules by mouth daily.     [provider]  ezetimibe  (ZETIA ) 10 MG tablet TAKE ONE TABLET BY MOUTH EVERY DAY 02/09/24   Marylynn Verneita CROME, MD  hyoscyamine  (ANASPAZ ) 0.125 MG TBDP disintergrating tablet Place 1 tablet (0.125 mg total) under the tongue every 6 (six) hours as needed (as needed for esophageal spasm). 01/11/20    Marylynn Verneita CROME, MD  losartan  (COZAAR ) 50 MG tablet Take 1 tablet (50 mg total) by mouth at bedtime. 05/03/24   Tullo, Teresa L, MD  metoprolol  succinate (TOPROL -XL) 25 MG 24 hr tablet TAKE ONE TABLET BY MOUTH ONCE DAILY 06/28/24   End, Lonni, MD  omeprazole  (PRILOSEC) 20 MG capsule TAKE 1 CAPSULE BY MOUTH ONCE DAILY 01/28/24   Marylynn Verneita CROME, MD    Family History Family History  Problem Relation Age of Onset   Heart disease Mother    Arthritis Mother    Diabetes Father     Social History Social History   Tobacco Use   Smoking status: Never   Smokeless tobacco: Never  Vaping Use   Vaping status: Never Used  Substance Use Topics   Alcohol use: No    Alcohol/week: 0.0 standard drinks of alcohol  Drug use: No     Allergies   Ciprofloxacin , Clindamycin/lincomycin, Sulfa antibiotics, Augmentin [amoxicillin-pot clavulanate], Gabapentin , Gemtesa [vibegron], Lidocaine , Naproxen  sodium, Prednisone , Tramadol, Codeine, Doxycycline hyclate, E-mycin [erythromycin], Macrobid [nitrofurantoin], Nabumetone, Nitrofurantoin monohyd macro, Nsaids, Phenobarbital, and Statins   Review of Systems Review of Systems   Physical Exam Triage Vital Signs ED Triage Vitals [07/08/24 1111]  Encounter Vitals Group     BP 138/85     Girls Systolic BP Percentile      Girls Diastolic BP Percentile      Boys Systolic BP Percentile      Boys Diastolic BP Percentile      Pulse Rate 65     Resp 16     Temp 98.3 F (36.8 C)     Temp Source Oral     SpO2 98 %     Weight      Height      Head Circumference      Peak Flow      Pain Score      Pain Loc      Pain Education      Exclude from Growth Chart    No data found.  Updated Vital Signs BP 138/85 (BP Location: Left Arm)   Pulse 65   Temp 98.3 F (36.8 C) (Oral)   Resp 16   SpO2 98%   Visual Acuity Right Eye Distance:   Left Eye Distance:   Bilateral Distance:    Right Eye Near:   Left Eye Near:    Bilateral Near:      Physical Exam Constitutional:      Appearance: Normal appearance.  Eyes:     Extraocular Movements: Extraocular movements intact.  Pulmonary:     Effort: Pulmonary effort is normal.  Abdominal:     Tenderness: There is no abdominal tenderness. There is no right CVA tenderness, left CVA tenderness or guarding.  Neurological:     Mental Status: She is alert and oriented to person, place, and time.      UC Treatments / Results  Labs (all labs ordered are listed, but only abnormal results are displayed) Labs Reviewed  POCT URINE DIPSTICK - Abnormal; Notable for the following components:      Result Value   Clarity, UA cloudy (*)    Blood, UA large (*)    POC PROTEIN,UA =100 (*)    Leukocytes, UA Small (1+) (*)    All other components within normal limits  URINE CULTURE    EKG   Radiology No results found.  Procedures Procedures (including critical care time)  Medications Ordered in UC Medications - No data to display  Initial Impression / Assessment and Plan / UC Course  I have reviewed the triage vital signs and the nursing notes.  Pertinent labs & imaging results that were available during my care of the patient were reviewed by me and considered in my medical decision making (see chart for details).  Urinary frequency, dysuria  Urinalysis showing hemoglobin and leukocytes, negative for nitrates, sent for culture, patient endorsing that she does not want to receive any treatment from the clinic but will follow-up with her primary doctor, discussed with patient that after notification of urine culture if bacteria is present then she will need to personally call her physician for further management, recommended supportive care with follow-up as needed Final Clinical Impressions(s) / UC Diagnoses   Final diagnoses:  Urinary frequency  Dysuria     Discharge Instructions  Your urinalysis shows Arabela Basaldua blood cells and blood but does not show nitrates which is  the indication for bacteria which are indicative of infection, your urine will be sent to the lab to determine exactly which bacteria is present  If bacteria is present in your urine you will be notified because you will need to start antibiotics, at that time you can refuse treatment and reach out to your doctor for further management  Increase your fluid intake through use of water  As always practice good hygiene, wiping front to back and avoidance of scented vaginal products to prevent further irritation  If symptoms continue to persist after use of medication or recur please follow-up with urgent care or your primary doctor as needed    ED Prescriptions   None    PDMP not reviewed this encounter.   Teresa Shelba SAUNDERS, NP 07/08/24 1136

## 2024-07-09 LAB — URINE CULTURE: Culture: NO GROWTH

## 2024-07-10 ENCOUNTER — Ambulatory Visit (HOSPITAL_COMMUNITY): Payer: Self-pay

## 2024-07-10 NOTE — Telephone Encounter (Unsigned)
 Copied from CRM [id number]. Topic: Clinical - Red Word Triage >> Jul 07, 2024  9:10 AM Martinique E wrote: [facility] that prompted transfer to Nurse Triage: Potential UTI, leaky bladder, severe burning when urinating and vaginal pain. >> Jul 10, 2024  1:41 PM Franky GRADE wrote: Patient spoke with Harlene earlier today and called back to follow up regarding UTI concerns, Called [facility] and was informed Harlene was with another patient. Patient stated she was still in pain and I offered to transfer to nurse triage but patient declined and only wanted to speak with Harlene as soon as possible.

## 2024-07-10 NOTE — Telephone Encounter (Signed)
 Copied from CRM [id number]. Topic: Clinical - Lab/Test Results >> Jul 10, 2024  9:11 AM Brenda Cardenas wrote: Reason for CRM: Patient would like to let Dr. Marylynn nurse Harlene know that Saturday she had to go to ER due to bladder issues (per nurse request) , advised she had blood in her bladder And needs to be seen in 3 days. Stated she can go in her Mychart to read report. Please call [phone] (would like Harlene to call)

## 2024-07-10 NOTE — Telephone Encounter (Signed)
 Spoke with pt to see about getting her scheduled today at 11 am since we had a cancellation. Pt stated that she didn't think she would be able to get the bus to bring her on such short notice. Pt also stated that she was seen over the weekend at the [facility] for bladder pain. They checked her for a UTI but the culture has not came back yet.

## 2024-07-11 NOTE — Telephone Encounter (Signed)
 Spoke with patient and made her aware of Urine Culture and there was no UTI infection. Patient states she is scheduled to Urologist on 07/27/24 and she will wait until her appointment to discuss with provider. Patient states she is still having some burning down there.

## 2024-07-19 ENCOUNTER — Other Ambulatory Visit (HOSPITAL_COMMUNITY): Payer: Self-pay

## 2024-07-19 ENCOUNTER — Ambulatory Visit: Payer: Self-pay

## 2024-07-19 ENCOUNTER — Other Ambulatory Visit: Payer: Self-pay

## 2024-07-19 ENCOUNTER — Encounter (HOSPITAL_COMMUNITY): Payer: Self-pay

## 2024-07-19 MED ORDER — DENOSUMAB 60 MG/ML ~~LOC~~ SOSY
60.0000 mg | PREFILLED_SYRINGE | SUBCUTANEOUS | 1 refills | Status: AC
  Filled 2024-07-19 – 2024-07-21 (×2): qty 1, 180d supply, fill #0

## 2024-07-19 NOTE — Addendum Note (Signed)
 Addended by: BRIEN SHARENE RAMAN on: 07/19/2024 05:00 PM   Modules accepted: Orders

## 2024-07-19 NOTE — Telephone Encounter (Signed)
 FYI Only or Action Required?: FYI only for provider.  Patient was last seen in primary care on 06/29/2024 by Marylynn Verneita CROME, MD.  Called Nurse Triage reporting Vaccine Question.  Symptoms began n/a.  Interventions attempted: Other: n/a.  Symptoms are: n/a.  Triage Disposition: Information or Advice Only Call  Patient/caregiver understands and will follow disposition?: Yes Reason for Disposition  Health information question, no triage required and triager able to answer question  Answer Assessment - Initial Assessment Questions Advised patient it is safe to get Covid and Flu shot in the same day.   1. REASON FOR CALL: What is the main reason for your call? or How can I best help you?     Patient called to ask if its safe to get Covid and Flu shot in the same day  2. SYMPTOMS : Do you have any symptoms?      N/A  Protocols used: Information Only Call - No Triage-A-AH  Copied from CRM [id number]. Topic: Clinical - Medical Advice >> Jul 19, 2024 10:49 AM Macario HERO wrote: Reason for CRM: Patient calling to speak with Dr. Lula nurse. States she lives at [facility] and would like to know if she should take the flu shot in the morning and covid shot in the afternoon. She is requesting a call back as soon as possible.

## 2024-07-19 NOTE — Progress Notes (Signed)
 Patient to be enrolled with [facility]. Routed to Tiffany.

## 2024-07-20 ENCOUNTER — Other Ambulatory Visit: Payer: Self-pay

## 2024-07-20 ENCOUNTER — Other Ambulatory Visit (HOSPITAL_COMMUNITY): Payer: Self-pay

## 2024-07-20 NOTE — Progress Notes (Signed)
 Pharmacy Patient Advocate Encounter  Insurance verification completed.   The patient is insured through [employer]   Ran test claim for Prolia. Co-pay is $64.  This test claim was processed through [facility]- copay amounts may vary at other pharmacies due to pharmacy/plan contracts, or as the patient moves through the different stages of their insurance plan.

## 2024-07-21 ENCOUNTER — Other Ambulatory Visit (HOSPITAL_COMMUNITY): Payer: Self-pay

## 2024-07-21 ENCOUNTER — Other Ambulatory Visit: Payer: Self-pay

## 2024-07-21 NOTE — Progress Notes (Signed)
 Specialty Pharmacy Initial Fill Coordination Note  Brenda Cardenas is a 89 y.o. female contacted today regarding initial fill of specialty medication(s) Denosumab  (PROLIA )   Patient requested Courier to Provider Office   Delivery date: 07/27/24   Verified address: [facility] [address] [address]   Medication will be filled on: 07/26/24   Patient is aware of $64 copayment.

## 2024-07-25 ENCOUNTER — Other Ambulatory Visit: Payer: Self-pay

## 2024-07-26 ENCOUNTER — Other Ambulatory Visit: Payer: Self-pay

## 2024-07-27 ENCOUNTER — Ambulatory Visit: Payer: Self-pay

## 2024-07-27 NOTE — Telephone Encounter (Signed)
 FYI Only or Action Required?: Action required by provider: refused ED, requesting stool softener and laxative.  Patient was last seen in primary care on 06/29/2024 by Marylynn Verneita CROME, MD.  Called Nurse Triage reporting Diarrhea and Rectal Bleeding.  Symptoms began yesterday.  Interventions attempted: Nothing.  Symptoms are: massive bloody diarrhea BM yesterday afternoon that she states went all over the bathroom and through her Depends diaper, bleeding from rectum after passing any flatulence or anytime wiping rectum, constipation/straining for BM's chronically stable.  Triage Disposition: Go to ED Now (Notify PCP)  Patient/caregiver understands and will follow disposition?: No, wishes to speak with PCP                Copied from CRM #[id number]. Topic: Clinical - Red Word Triage >> Jul 27, 2024 12:50 PM Alfonso HERO wrote: Red Word that prompted transfer to Nurse Triage: blood in bowels Reason for Disposition  SEVERE rectal bleeding (e.g., large blood clots; constant or on and off bleeding)  Answer Assessment - Initial Assessment Questions Patient calling in due to she was due to have a cystoscopic exam with her urologist today and had to cancel due to the diarrhea and blood from her rectum. Difficulty triaging/assessing patient. Patient has difficulty answering any questions or specifics and continues repeating the same things about missing her urology appointment and wanting a stool softener prescribed by Dr Marylynn. Patient continues interrupting RN and repeating her same concerns about urology and the stool softener. Patient refusing ED and states she would just like Dr Marylynn to write her for a stool softener or laxative. Called [facility] and notified staff of ED refusal.  1. APPEARANCE of BLOOD: What color is it? Is it passed separately, on the surface of the stool, or mixed in with the stool?      Yesterday she states it was brown but bloody and keeps answering dark for the  color.  2. AMOUNT: How much blood was passed?      She states there was a large amount of blood, it filled her entire Depends yesterday. She states now there is a just a small amount that comes out when she passes gas or anytime she wipes.  3. FREQUENCY: How many times has blood been passed with the stools?      Patient repeats the same story that there were 2 episodes yesterday. She is unable to answer the question. She states every time she wipes or breaks wind there is blood coming from her rectum. Repeated question to ask patient how often or how many times that is and patient repeats/starts over the same story starting with yesterday's episode.  4. ONSET: When was the blood first seen in the stools? (Days or weeks)      Yesterday afternoon.  5. DIARRHEA: Is there also some diarrhea? If Yes, ask: How many diarrhea stools in the past 24 hours?      Yes.  6. CONSTIPATION: Do you have constipation? If Yes, ask: How bad is it?     Yes. She states she usually has to strain to go. Complains of chronic constipation/straining.  7. RECURRENT SYMPTOMS: Have you had blood in your stools before? If Yes, ask: When was the last time? and What happened that time?      Yes.She states she has had bleeding from her rectum but not as much as she was having yesterday.  8. BLOOD THINNERS: Do you take any blood thinners? (e.g., aspirin , clopidogrel  / Plavix , coumadin, heparin ). Notes: Other strong  blood thinners include: Arixtra (fondaparinux), Eliquis (apixaban), Pradaxa (dabigatran), and Xarelto (rivaroxaban).     Plavix  stopped on 07/19/24.  9. OTHER SYMPTOMS: Do you have any other symptoms?  (e.g., abdomen pain, vomiting, dizziness, fever)     Denies abdominal pain.  Protocols used: Rectal Bleeding-A-AH

## 2024-07-27 NOTE — Telephone Encounter (Signed)
 Spoke with pt and advised her that with the massive bloody diarrhea it is best that she be evaluated at the ED. Pt got load and stated that she is not going to the ED because this has been going on for 4 years. Pt stated that she does not want to talk to me she only wants to talk to Dr. Marylynn. I advised pt that Dr. Marylynn is not here right now but will be back in the morning. Pt asked if she could talk with Dr. Marylynn tomorrow. I let pt know that Dr. Marylynn will be seeing pt's all day tomorrow and that she will need to schedule an appt to discuss. I offered pt the 5 pm appt tomorrow but pt refused. Pt stated that she has a call out to one of her other doctor's and will just wait till they call her back. Pt stated that she is not having any rectal bleeding at this time and would like to know if a stool softener would help because she trouble having BM and that is what causes her to have diarrhea.

## 2024-07-28 ENCOUNTER — Other Ambulatory Visit: Payer: Self-pay | Admitting: Internal Medicine

## 2024-07-28 ENCOUNTER — Telehealth: Payer: Self-pay

## 2024-07-28 MED ORDER — DOCUSATE SODIUM 100 MG PO CAPS: 200.0000 mg | ORAL_CAPSULE | Freq: Every day | ORAL | 2 refills | Status: AC

## 2024-07-28 MED ORDER — POLYETHYLENE GLYCOL 3350 17 GM/SCOOP PO POWD: 17.0000 g | Freq: Every day | ORAL | 1 refills | Status: AC

## 2024-07-28 NOTE — Telephone Encounter (Signed)
 Copied from CRM #[id number]. Topic: Clinical - Medication Question >> Jul 28, 2024 10:42 AM Burnard DEL wrote: Reason for CRM: Patient called in stating that her urologist said that her PCP has to prescribe her stool softeners  and laxatives for her. I informed patient that provider stated that she could get these OTC,and she said that she has to have a prescription for them. Patient is requesting a phone call.

## 2024-07-28 NOTE — Telephone Encounter (Signed)
 LMTCB. Please let pt know that the colace and the miralax has been sent in to [facility].

## 2024-07-28 NOTE — Telephone Encounter (Signed)
 See previous message

## 2024-07-28 NOTE — Telephone Encounter (Signed)
 Copied from CRM #[id number]. Topic: Clinical - Medication Question >> Jul 28, 2024 10:42 AM Brenda Cardenas wrote: Reason for CRM: Patient called in stating that her urologist said that her PCP has to prescribe her stool softeners  and laxatives for her. I informed patient that provider stated that she could get these OTC,and she said that she has to have a prescription for them. Patient is requesting a phone call.

## 2024-08-02 ENCOUNTER — Other Ambulatory Visit: Payer: Self-pay

## 2024-08-02 ENCOUNTER — Other Ambulatory Visit (HOSPITAL_COMMUNITY): Payer: Self-pay

## 2024-08-02 ENCOUNTER — Ambulatory Visit (INDEPENDENT_AMBULATORY_CARE_PROVIDER_SITE_OTHER)

## 2024-08-02 DIAGNOSIS — M810 Age-related osteoporosis without current pathological fracture: Secondary | ICD-10-CM | POA: Diagnosis not present

## 2024-08-02 NOTE — Progress Notes (Signed)
 Pt received Prolia  injection in Left deltoid muscle. Pt tolerated it well with no complaints or concerns.

## 2024-08-02 NOTE — Telephone Encounter (Signed)
 Pt stated that she has already received the medications.

## 2024-09-01 ENCOUNTER — Telehealth: Payer: Self-pay

## 2024-09-01 NOTE — Telephone Encounter (Signed)
 Copied from CRM #[id number]. Topic: Clinical - Medical Advice >> Sep 01, 2024  3:26 PM Alexandria E wrote: Reason for CRM: Patient would like PCP's advise on which cardiologist she should see moving forward. Patient stated that she was seeing Dr. Cindie, but he is now moving. So patient would like a recommendation for another cardiac electrophysiologist. Please call patient to advise.

## 2024-09-04 NOTE — Telephone Encounter (Signed)
 Pt is aware and gave a verbal understanding.

## 2024-09-06 DIAGNOSIS — R32 Unspecified urinary incontinence: Secondary | ICD-10-CM | POA: Diagnosis not present

## 2024-09-06 DIAGNOSIS — N390 Urinary tract infection, site not specified: Secondary | ICD-10-CM | POA: Diagnosis not present

## 2024-09-06 DIAGNOSIS — L258 Unspecified contact dermatitis due to other agents: Secondary | ICD-10-CM | POA: Diagnosis not present

## 2024-09-06 DIAGNOSIS — L439 Lichen planus, unspecified: Secondary | ICD-10-CM | POA: Diagnosis not present

## 2024-09-06 DIAGNOSIS — N952 Postmenopausal atrophic vaginitis: Secondary | ICD-10-CM | POA: Diagnosis not present

## 2024-09-06 DIAGNOSIS — N3941 Urge incontinence: Secondary | ICD-10-CM | POA: Diagnosis not present

## 2024-09-06 DIAGNOSIS — R102 Pelvic and perineal pain unspecified side: Secondary | ICD-10-CM | POA: Diagnosis not present

## 2024-09-06 DIAGNOSIS — R300 Dysuria: Secondary | ICD-10-CM | POA: Diagnosis not present

## 2024-09-11 ENCOUNTER — Telehealth: Payer: Self-pay

## 2024-09-11 NOTE — Telephone Encounter (Signed)
 Copied from CRM #[id number]. Topic: Referral - Request for Referral >> Sep 11, 2024  3:50 PM Zebedee SAUNDERS wrote: Did the patient discuss referral with their provider in the last year? Yes (If No - schedule appointment) (If Yes - send message)  Appointment offered? Yes  Type of order/referral and detailed reason for visit: PTNS treatment , pt can't travel to [facility] due to transportation.   Preference of office, provider, location: [facility] ph: [phone] fax: [phone]  If referral order, have you been seen by this specialty before? No (If Yes, this issue or another issue? When? Where?  Can we respond through MyChart? No

## 2024-09-12 NOTE — Telephone Encounter (Unsigned)
 Copied from CRM #[id number]. Topic: Clinical - Medical Advice >> Sep 12, 2024  1:56 PM Ashley R wrote: From previous Reason for Contact - Referral Question: Reason for CRM: No longer needs a referral for urology. Rec'd one from [facility] to [facility] urology to be closer to home [facility]). Wants to know if she needs to come back in to be seen to confirm the infection is gone or needs additional order for labs.

## 2024-09-12 NOTE — Telephone Encounter (Signed)
 Pt would like to know if she needs to be rechecked for a UTI. She recently had one and was prescribed fosfomycin  for one dose. Completed it and went back on the preventative antibiotic.

## 2024-09-13 NOTE — Telephone Encounter (Signed)
 LMTCB. Please relay Dr. Lula message below to pt when she returns the call.

## 2024-09-13 NOTE — Telephone Encounter (Unsigned)
 Copied from CRM #[id number]. Topic: Clinical - Medical Advice >> Sep 12, 2024  1:56 PM Ashley R wrote: From previous Reason for Contact - Referral Question: Reason for CRM: No longer needs a referral for urology. Rec'd one from [facility] to [facility] urology to be closer to home [facility]). Wants to know if she needs to come back in to be seen to confirm the infection is gone or needs additional order for labs. >> Sep 13, 2024  2:21 PM Robinson H wrote: Patient returned call to Harlene, agent relayed message to patient as stated from provider, patient states she has symptoms all the time and wants to speak with nurse.  [place]  [phone]

## 2024-09-15 ENCOUNTER — Ambulatory Visit: Payer: Self-pay

## 2024-09-15 NOTE — Telephone Encounter (Signed)
 [place] NP prescribed pt Miralax  for the constipation but pt does not want to start it at this time because she will be with family to celebrate Christmas. Pt stated that she will reach back out if anything further is needed.

## 2024-09-15 NOTE — Telephone Encounter (Signed)
 FYI Only or Action Required?: FYI only for provider: see by NP at [facility].  Patient was last seen in primary care on 06/29/2024 by Marylynn Verneita CROME, MD.  Called Nurse Triage reporting Constipation.  Symptoms began yesterday.  Interventions attempted: Prescription medications: Colace, miralax .  Symptoms are: gradually improving.  Triage Disposition: Information or Advice Only Call  Patient/caregiver understands and will follow disposition?: Yes   Copied from CRM #[id number]. Topic: Clinical - Medical Advice >> Sep 15, 2024 10:12 AM Hadassah PARAS wrote: Reason for CRM: Pt has a problem with bladder, has been urinating freaquently. Has had loose bowels. Pt would like to know if she is running a fever. Refused to scheduled app as she would like to send a message first. Pt is 89 yrs old and if medication will be prescribed she would need to know ASAP due to transportation. Please call on #[phone] Reason for Disposition  Health information question, no triage required and triager able to answer question  Answer Assessment - Initial Assessment Questions 1. REASON FOR CALL: What is the main reason for your call? or How can I best help you?     Patient has leaky bladder. Having issues with her BM. Denies fever. Constipated and feels like she needs to go but then only having a small amount of loose stool.  She lives at [facility] and NP at facility examined her and prescribed miralax . She wants to hold off on it until after tomorrow bc her family is coming to celebrate Christmas.   She will reach out with any additional needs  Protocols used: Information Only Call - No Triage-A-AH

## 2024-09-27 ENCOUNTER — Other Ambulatory Visit: Payer: Self-pay | Admitting: Internal Medicine

## 2024-10-02 ENCOUNTER — Telehealth: Payer: Self-pay

## 2024-10-02 ENCOUNTER — Ambulatory Visit: Admitting: Urology

## 2024-10-02 ENCOUNTER — Other Ambulatory Visit: Payer: Self-pay

## 2024-10-02 VITALS — BP 179/103 | HR 89 | Ht 60.0 in | Wt 108.0 lb

## 2024-10-02 DIAGNOSIS — N3941 Urge incontinence: Secondary | ICD-10-CM | POA: Diagnosis not present

## 2024-10-02 NOTE — Telephone Encounter (Signed)
 Called [employer] to get Authorization for PTNS then I was told to call [facility]. Called [employer] for PTNS and was told that we are outside the network.

## 2024-10-02 NOTE — Telephone Encounter (Signed)
 Called patient to let patient know that [facility] would not cover PTNS, and she wants to go head and get PTNS. Patient wants to wait till after she see's her Gynecologist. Patient said that would call back after her Gynecologist appointment in February.

## 2024-10-02 NOTE — Telephone Encounter (Signed)
 Copied from CRM [id number]. Topic: Clinical - Medical Advice >> Oct 02, 2024 11:36 AM Nessti S wrote: Reason for CRM: pt called because she has a leaky bladder and [facility] gave estrogen ring to have inserted by pcp. She wants to know if pcp would be able to insert estrogen ring. She would like a call back soon as possible.  [phone]

## 2024-10-02 NOTE — Telephone Encounter (Signed)
 Pt called back and stated that she was able to get an appt with her GYN.

## 2024-10-02 NOTE — Progress Notes (Signed)
 "  10/02/2024 9:07 AM   Brenda Cardenas 12/28/1931 [id number]  Referring provider: Marylynn Verneita CROME, MD [address] [place],  [place] [address]  Chief Complaint  Patient presents with   Establish Care   Urinary Incontinence    HPI: Dr Tina HOUSTON: Dysuria and urethral pain as well as hematuria.  Diffuse inflammation with cystoscopy.  Cytology consistent with inflammation.  Not interested in another OAB medication.  Vaginal estrogen cream given.  Repeat cystoscopy scheduled for April 2025.  Cystoscopy Feb 04, 2023 with diffuse cystitis and erythema and no discrete tumor continues to have terminal dysuria.  Gemtesa caused diarrhea.  Had a video call with [facility] April 2025.  Today It appears patient was to have another cystoscopy at [facility] January 2026.  Because of travel she wants to have her care here.  It looks like she went to [facility] as well.  They recommended percutaneous tibial nerve stimulation.  She wants to have it done locally  She says that her bladder is inflamed and she always has blood in the urine and she has been having regular cystoscopies  She normally voids every 15 to 30 minutes in both day and night.  She cannot hold it for 2 hours.  She is a reasonable historian but some of the details were more challenging.  She denied urge incontinence bedwetting and leakage of awareness but wears 5 or 6 pads a day.  Then she thought she probably does have urge incontinence.  She wears 1 pad at night but she says she can get to the restroom in time  No hysterectomy  Patient seen at [facility] urogynecology December 2025.  She has lichen planus and overactive bladder with vaginal pain and frequency.  She was using estrogen cream and a local steroid cream and the applicator causes marked pain and vaginal bleeding.  She had failed mirabegron as well as the Gemtesa.  She has had a stroke and has previously taken Plavix .  She has had negative cultures and perhaps positive cultures.  It was felt that  her vaginal atrophy was the cause of vaginal pain and was prescribed an Estring ring continue to use local estrogen cream.  Was not prescribed anticholinergics due to age and unsteady gait.  Percutaneous tibial nerve stimulation ordered.  Pain likely from atrophy and not to like an planus.  Refer to vulvar dermatology if symptoms do not improve  Patient is currently on daily Keflex .  She uses local estrogen.  She has not filled the prescription of the Estring ring.  She uses the local steroid cream.  She has never smoked.       PMH: Past Medical History:  Diagnosis Date   Allergy Dates in my chart   Anxiety Since I have  SVT   Arrhythmia 11:12/22   Arthritis July 2023   In my left knee   Basal cell carcinoma 06/04/2010   left upper back   Cataract ?   Chronic kidney disease    Colon polyps    Gastritis    GERD (gastroesophageal reflux disease)    Hiatal hernia    Hyperlipidemia    Hypertension    IBS (irritable bowel syndrome)    Low back pain radiating to left leg 03/28/2020   Sciatica, left side 04/15/2020   Squamous cell carcinoma of skin 06/26/2013   left mid pretibial   Squamous cell carcinoma of skin 08/21/2015   lower sternum   Squamous cell carcinoma of skin 10/14/2017   right  mid pretibial   Squamous cell carcinoma of skin 03/17/2018   right prox lateral elbow   Squamous cell carcinoma of skin 09/07/2018   right post thigh   Squamous cell carcinoma of skin 05/11/2019   right mid med pretibial   Squamous cell carcinoma of skin 06/20/2019   right bicep   Supraventricular tachycardia     Surgical History: Past Surgical History:  Procedure Laterality Date   APPENDECTOMY  1959   CARPAL TUNNEL RELEASE     CATARACT EXTRACTION W/ INTRAOCULAR LENS IMPLANT Left    CATARACT EXTRACTION W/PHACO Right 04/20/2018   Procedure: CATARACT EXTRACTION PHACO AND INTRAOCULAR LENS PLACEMENT (IOC)  RIGHT;  Surgeon: Mittie Gaskin, MD;  Location: [facility];   Service: Ophthalmology;  Laterality: Right;   CHOLECYSTECTOMY  1995   COLONOSCOPY WITH PROPOFOL  N/A 07/30/2022   Procedure: COLONOSCOPY WITH PROPOFOL ;  Surgeon: Unk Corinn Skiff, MD;  Location: [facility];  Service: Gastroenterology;  Laterality: N/A;   EYE SURGERY  04/07/22   GANGLION CYST EXCISION     JOINT REPLACEMENT Right 03/2008   [facility]    KNEE ARTHROSCOPY     LITHOTRIPSY     SALIVARY GLAND SURGERY     SKIN CANCER EXCISION     TONSILLECTOMY     TONSILLECTOMY AND ADENOIDECTOMY     VEIN LIGATION      Home Medications:  Allergies as of 10/02/2024       Reactions   Ciprofloxacin  Diarrhea   Clindamycin/lincomycin Diarrhea   Sulfa Antibiotics Rash   Rash in throat   Augmentin [amoxicillin-pot Clavulanate] Diarrhea   Gabapentin  Other (See Comments)   Irregular heartbeat   Gemtesa [vibegron] Diarrhea   Lidocaine     Other reaction(s): Not available   Naproxen  Sodium Other (See Comments)   Causes gastritis   Prednisone     Atrial Fibrillation   Tramadol Diarrhea   upset stomach, headache, dizziness, drowsiness   Codeine Nausea Only, Rash   Upset stomach   Doxycycline Hyclate Nausea And Vomiting, Nausea Only   Dry heaves.   E-mycin [erythromycin] Rash   Macrobid [nitrofurantoin] Rash   severe   Nabumetone Rash, Other (See Comments)   Upsets liver enzymes   Nitrofurantoin Monohyd Macro Rash   Nsaids Rash   Gastritis, GI ulceration.   Phenobarbital Other (See Comments), Rash, Anxiety   got wild Patient becomes very hyper and anxious   Statins Rash   Upsets liver enzymes        Medication List        Accurate as of October 02, 2024  9:07 AM. If you have any questions, ask your nurse or doctor.          STOP taking these medications    clopidogrel  75 MG tablet Commonly known as: PLAVIX  Stopped by: Glendia Elizabeth, MD       TAKE these medications    acetaminophen  500 MG tablet Commonly known as: TYLENOL  Take 1,000 mg by mouth 2 (two)  times daily.   amiodarone  200 MG tablet Commonly known as: PACERONE  TAKE ONE TABLET BY MOUTH EVERY DAY   aspirin  EC 81 MG tablet Take 1 tablet (81 mg total) by mouth daily. Swallow whole.   cephALEXin  250 MG capsule Commonly known as: KEFLEX  Take 250 mg by mouth 2 (two) times daily.   CRANBERRY PO Take 2 capsules by mouth daily.   docusate sodium  100 MG capsule Commonly known as: Colace Take 2 capsules (200 mg total) by mouth daily.   estradiol 7.5 MCG/24HR  vaginal ring Commonly known as: ESTRING Place 2 mg vaginally.   ezetimibe  10 MG tablet Commonly known as: ZETIA  TAKE ONE TABLET BY MOUTH EVERY DAY   hyoscyamine  0.125 MG Tbdp disintergrating tablet Commonly known as: ANASPAZ  Place 1 tablet (0.125 mg total) under the tongue every 6 (six) hours as needed (as needed for esophageal spasm).   losartan  50 MG tablet Commonly known as: COZAAR  Take 1 tablet (50 mg total) by mouth at bedtime.   metoprolol  succinate 25 MG 24 hr tablet Commonly known as: TOPROL -XL TAKE ONE TABLET BY MOUTH ONCE DAILY   omeprazole  20 MG capsule Commonly known as: PRILOSEC TAKE 1 CAPSULE BY MOUTH ONCE DAILY   polyethylene glycol powder 17 GM/SCOOP powder Commonly known as: GLYCOLAX /MIRALAX  Take 17 g by mouth daily. Dissolve 1 capful (17g) in 4-8 ounces of liquid and take by mouth daily.   Prolia  60 MG/ML Sosy injection Generic drug: denosumab  Inject 60 mg into the skin every 6 (six) months.   Vitamin D  50 MCG (2000 UT) Caps Take 1 capsule by mouth every other day.        Allergies: Allergies[1]  Family History: Family History  Problem Relation Age of Onset   Heart disease Mother    Arthritis Mother    Diabetes Father     Social History:  reports that she has never smoked. She has never used smokeless tobacco. She reports that she does not drink alcohol and does not use drugs.  ROS:                                        Physical Exam: BP (!)  179/103   Pulse 89   Ht 5' (1.524 m)   Wt 49 kg   BMI 21.09 kg/m   Constitutional:  Alert and oriented, No acute distress. HEENT: [place] AT, moist mucus membranes.  Trachea midline, no masses.   Laboratory Data: Lab Results  Component Value Date   WBC 15.5 (H) 05/03/2024   HGB 10.8 (L) 05/03/2024   HCT 33.9 (L) 05/03/2024   MCV 101.7 (H) 05/03/2024   PLT 176.0 05/03/2024    Lab Results  Component Value Date   CREATININE 0.96 05/03/2024    No results found for: PSA  No results found for: TESTOSTERONE  Lab Results  Component Value Date   HGBA1C 4.8 04/18/2024    Urinalysis    Component Value Date/Time   COLORURINE YELLOW (A) 04/18/2024 0205   APPEARANCEUR CLOUDY (A) 04/18/2024 0205   LABSPEC 1.020 04/18/2024 0205   PHURINE 5.0 04/18/2024 0205   GLUCOSEU NEGATIVE 04/18/2024 0205   GLUCOSEU NEGATIVE 02/18/2024 1134   HGBUR LARGE (A) 04/18/2024 0205   BILIRUBINUR negative 07/08/2024 1114   BILIRUBINUR negative 02/18/2024 1130   KETONESUR negative 07/08/2024 1114   KETONESUR NEGATIVE 04/18/2024 0205   PROTEINUR 100 (A) 04/18/2024 0205   UROBILINOGEN 0.2 07/08/2024 1114   UROBILINOGEN 0.2 02/18/2024 1134   NITRITE Negative 07/08/2024 1114   NITRITE NEGATIVE 04/18/2024 0205   LEUKOCYTESUR Small (1+) (A) 07/08/2024 1114   LEUKOCYTESUR SMALL (A) 04/18/2024 0205    Pertinent Imaging: Urine reviewed and sent for culture.  Chart reviewed  Assessment & Plan: I had a very lengthy conversation with the patient.  She understands that she has a chronically inflamed bladder and she has been monitored every 6 months with cystoscopy and and sometimes cytology to make certain she does  not develop bladder cancer is a non-smoker.  I told her I can do cystoscopy here but certainly having the same set of eyes with continuity of care is better.  The patient actually wants to see her urologist this month and will follow-up.  She wants percutaneous tibial nerve stimulation for  frequency nocturia and urge incontinence and we will start them here.  She understands the success may be limited by her chronically inflamed bladder.  She understands that I do not treat lichen sclerosus or lichen planus and prescribe and change the Estring ring.  She would need a local gynecologist or another provider such as the urogynecologist at [facility] but she does not want to go back to [facility].  In summary she will start percutaneous tibial nerve stimulation.  She will stay on daily Keflex  and estrogen and steroid cream.  She will see her urologist in a few weeks.  She may or may not use the Estring ring in the future.  Call if urine culture positive  Unfortunately she has had a number of providers and distance is difficult for her  1. Urge incontinence (Primary)  - Urinalysis, Complete   No follow-ups on file.  Glendia DELENA Elizabeth, MD  [facility] [address] [place], [place] [address] [phone]     [1]  Allergies Allergen Reactions   Ciprofloxacin  Diarrhea   Clindamycin/Lincomycin Diarrhea   Sulfa Antibiotics Rash    Rash in throat   Augmentin [Amoxicillin-Pot Clavulanate] Diarrhea   Gabapentin  Other (See Comments)    Irregular heartbeat   Gemtesa [Vibegron] Diarrhea   Lidocaine      Other reaction(s): Not available   Naproxen  Sodium Other (See Comments)    Causes gastritis   Prednisone      Atrial Fibrillation   Tramadol Diarrhea    upset stomach, headache, dizziness, drowsiness   Codeine Nausea Only and Rash    Upset stomach   Doxycycline Hyclate Nausea And Vomiting and Nausea Only    Dry heaves.    E-Mycin [Erythromycin] Rash   Macrobid [Nitrofurantoin] Rash    severe   Nabumetone Rash and Other (See Comments)    Upsets liver enzymes   Nitrofurantoin Monohyd Macro Rash   Nsaids Rash    Gastritis, GI ulceration.    Phenobarbital Other (See Comments), Rash and Anxiety    got wild Patient becomes very hyper and  anxious   Statins Rash    Upsets liver enzymes   "

## 2024-10-02 NOTE — Telephone Encounter (Signed)
 Copied from CRM [id number]. Topic: Clinical - Medical Advice >> Oct 02, 2024 11:36 AM Nessti S wrote: Reason for CRM: pt called because she has a leaky bladder and [facility] gave estrogen ring to have inserted by pcp. She wants to know if pcp would be able to insert estrogen ring. She would like a call back soon as possible.  [phone] >> Oct 02, 2024  3:31 PM Macario HERO wrote: Patient called and said to disregard message because she was able to speak with a her gynecologist and scheduled an appointment with her.

## 2024-10-03 LAB — URINALYSIS, COMPLETE
Bilirubin, UA: NEGATIVE
Glucose, UA: NEGATIVE
Ketones, UA: NEGATIVE
Nitrite, UA: NEGATIVE
Specific Gravity, UA: 1.03 (ref 1.005–1.030)
Urobilinogen, Ur: 0.2 mg/dL (ref 0.2–1.0)
pH, UA: 6 (ref 5.0–7.5)

## 2024-10-03 LAB — MICROSCOPIC EXAMINATION
Epithelial Cells (non renal): >10 /HPF — AB (ref 0–10)
RBC, Urine: >30 /HPF — AB (ref 0–2)

## 2024-10-03 NOTE — Telephone Encounter (Signed)
 [employer] called and lvm wanting a call back to discuss this patient. They need the CTP codes for the PTNS.

## 2024-10-03 NOTE — Telephone Encounter (Signed)
 PT called back this morning and wanted some more clarifications on how much the PTNS would cost her since she is out of network with our office. She said that she would not be able to schedule anythign until after she sees her OBGYN on 2/10.

## 2024-10-04 LAB — CULTURE, URINE COMPREHENSIVE

## 2024-10-04 NOTE — Telephone Encounter (Signed)
 Called [employer] and spoke with  Naomie to get a Prior Authorization for PTNS. PTNS is Approved the number is [id number]

## 2024-10-11 ENCOUNTER — Other Ambulatory Visit: Payer: Self-pay | Admitting: Internal Medicine

## 2024-10-16 ENCOUNTER — Telehealth: Payer: Self-pay | Admitting: *Deleted

## 2024-10-16 DIAGNOSIS — I10 Essential (primary) hypertension: Secondary | ICD-10-CM

## 2024-10-16 DIAGNOSIS — D649 Anemia, unspecified: Secondary | ICD-10-CM

## 2024-10-16 DIAGNOSIS — E785 Hyperlipidemia, unspecified: Secondary | ICD-10-CM

## 2024-10-16 NOTE — Telephone Encounter (Signed)
 Called patient back and talked with her. The patient says that she had gotten a letter from us  with a appointment date on.

## 2024-10-16 NOTE — Telephone Encounter (Signed)
 I have pended labs for your approval for pt to have done at the beginning of Feb. If needed we will get pt scheduled for a lab appt.

## 2024-10-16 NOTE — Telephone Encounter (Signed)
 Copied from CRM [id number]. Topic: Clinical - Request for Lab/Test Order >> Oct 16, 2024 11:46 AM Roselie BROCKS wrote: Reason for CRM: Patient states she wants to have fasting labs ordered for her.  Patient states she was to schedule the beginning of feb .please reach out to patient once ordered.

## 2024-10-16 NOTE — Telephone Encounter (Signed)
 Patient called regarding authorization for PTNS and scheduling. She said she has a letter stating dates she is approved for, and is concerned about getting scheduled. Please advise patient about scheduling.

## 2024-10-17 ENCOUNTER — Ambulatory Visit (INDEPENDENT_AMBULATORY_CARE_PROVIDER_SITE_OTHER): Admitting: Vascular Surgery

## 2024-10-17 ENCOUNTER — Telehealth: Payer: Self-pay | Admitting: Urology

## 2024-10-17 NOTE — Telephone Encounter (Signed)
 Patient called wanting to get scheduled for her PTNS appointments. She is able to start PTNS treatments starting on 11/14/24 per her appoval from [facility]. Please advise.

## 2024-10-17 NOTE — Telephone Encounter (Signed)
 Pt needs to be scheduled for a lab appt in early February.

## 2024-10-17 NOTE — Telephone Encounter (Signed)
 noted

## 2024-10-24 ENCOUNTER — Ambulatory Visit (INDEPENDENT_AMBULATORY_CARE_PROVIDER_SITE_OTHER): Admitting: Vascular Surgery

## 2024-10-27 ENCOUNTER — Ambulatory Visit: Attending: Internal Medicine | Admitting: Internal Medicine

## 2024-10-27 VITALS — BP 105/66 | HR 72 | Ht 60.0 in | Wt 109.2 lb

## 2024-10-27 DIAGNOSIS — Z8673 Personal history of transient ischemic attack (TIA), and cerebral infarction without residual deficits: Secondary | ICD-10-CM | POA: Diagnosis not present

## 2024-10-27 DIAGNOSIS — I471 Supraventricular tachycardia, unspecified: Secondary | ICD-10-CM | POA: Diagnosis not present

## 2024-10-27 DIAGNOSIS — R600 Localized edema: Secondary | ICD-10-CM

## 2024-10-27 NOTE — Patient Instructions (Addendum)
 Medication Instructions:  Your physician recommends that you continue on your current medications as directed. Please refer to the Current Medication list given to you today.    *If you need a refill on your cardiac medications before your next appointment, please call your pharmacy*  Lab Work: No labs ordered today    Testing/Procedures: No test ordered today   Follow-Up: At [facility], you and your health needs are our priority.  As part of our continuing mission to provide you with exceptional heart care, our providers are all part of one team.  This team includes your primary Cardiologist (physician) and Advanced Practice Providers or APPs (Physician Assistants and Nurse Practitioners) who all work together to provide you with the care you need, when you need it.  Your next appointment:   3 month(s)  Provider:   Suzann Riddle, NP    Your physician recommends that you schedule a follow-up appointment in 1 year with Dr. Mady.

## 2024-10-27 NOTE — Progress Notes (Unsigned)
" °  Cardiology Office Note:  .   Date:  10/27/2024  ID:  Brenda Cardenas, DOB 10/27/1931, MRN [id number] PCP: Marylynn Verneita CROME, MD  [facility] Providers Cardiologist:  Lonni Hanson, MD Electrophysiologist:  OLE ONEIDA HOLTS, MD (Inactive) { Click to update primary MD,subspecialty MD or APP then REFRESH:1}    History of Present Illness: .   Brenda Cardenas is a 89 y.o. female with history of supraventricular tachycardia, HTN, HLD, GERD, IBS, arthritis, and low back pain, who presents for follow-up of SVT.  I last saw her in 06/2023, at which time she was feeling well from a heart standpoint without significant palpitations.  She was subsequently seen by Dr. Holts, most recently in 06/2024, at which time her SVT was well-controlled on amiodarone .  No medication changes or additional testing were pursued.  Leaky bladder.  Going to start TENS program for incontinence via urology.  No palpitations or chest pain.  Had stroke and hit head.  Had double vision in July.  Was on Plavix  for a month.  Saw Dr. Maree.  Neck aches after fall.  No additional falls.  Labs with Dr. Marylynn next week.  ROS: See HPI  Studies Reviewed: SABRA   EKG Interpretation Date/Time:  Friday October 27 2024 08:16:40 EST Ventricular Rate:  72 PR Interval:  184 QRS Duration:  82 QT Interval:  420 QTC Calculation: 459 R Axis:   -59  Text Interpretation: Normal sinus rhythm Low voltage QRS Left anterior fascicular block Poor R wave progression Abnormal ECG When compared with ECG of 28-Jun-2024 11:23, No significant change was found Confirmed by Tyrice Hewitt, Lonni [phone]) on 10/27/2024 8:21:00 AM    *** Risk Assessment/Calculations:   {Does this patient have ATRIAL FIBRILLATION?:[id number]}         Physical Exam:   VS:  BP 105/66 (BP Location: Right Arm, Patient Position: Sitting, Cuff Size: Normal)   Pulse 72   Ht 5' (1.524 m)   Wt 109 lb 3.2 oz (49.5 kg)   BMI 21.33 kg/m    Wt Readings from Last 3 Encounters:   10/27/24 109 lb 3.2 oz (49.5 kg)  10/02/24 108 lb (49 kg)  06/29/24 107 lb 12.8 oz (48.9 kg)    General:  NAD. Neck: No JVD or HJR. Lungs: Clear to auscultation bilaterally without wheezes or crackles. Heart: Regular rate and rhythm without murmurs, rubs, or gallops. Abdomen: Soft, nontender, nondistended. Extremities: Trace pretibial edema with bruising.  ASSESSMENT AND PLAN: .    ***    {Are you ordering a CV Procedure (e.g. stress test, cath, DCCV, TEE, etc)?   Press F2        :[id number]}  Dispo: ***  Signed, Lonni Hanson, MD  "

## 2024-10-28 ENCOUNTER — Encounter: Payer: Self-pay | Admitting: Internal Medicine

## 2024-10-31 ENCOUNTER — Other Ambulatory Visit

## 2024-10-31 ENCOUNTER — Other Ambulatory Visit: Payer: Self-pay

## 2024-11-09 ENCOUNTER — Ambulatory Visit: Admitting: Urology

## 2024-11-16 ENCOUNTER — Ambulatory Visit: Admitting: Urology

## 2024-11-23 ENCOUNTER — Ambulatory Visit: Admitting: Urology

## 2024-11-27 ENCOUNTER — Ambulatory Visit: Admitting: Urology

## 2024-11-30 ENCOUNTER — Ambulatory Visit: Admitting: Urology

## 2024-12-07 ENCOUNTER — Ambulatory Visit: Admitting: Urology

## 2024-12-14 ENCOUNTER — Ambulatory Visit: Admitting: Urology

## 2024-12-21 ENCOUNTER — Ambulatory Visit: Admitting: Urology

## 2024-12-28 ENCOUNTER — Ambulatory Visit: Admitting: Urology

## 2025-01-04 ENCOUNTER — Ambulatory Visit: Admitting: Urology

## 2025-01-11 ENCOUNTER — Ambulatory Visit: Admitting: Urology

## 2025-01-18 ENCOUNTER — Ambulatory Visit: Admitting: Physician Assistant

## 2025-01-25 ENCOUNTER — Ambulatory Visit: Admitting: Urology

## 2025-01-25 ENCOUNTER — Ambulatory Visit: Admitting: Cardiology
